# Patient Record
Sex: Male | Born: 1950
Health system: Southern US, Community
[De-identification: ages and names within clinical notes are randomized; demographics above are authoritative.]

## PROBLEM LIST (undated history)

## (undated) DIAGNOSIS — F0390 Unspecified dementia without behavioral disturbance: Secondary | ICD-10-CM

## (undated) DIAGNOSIS — H269 Unspecified cataract: Secondary | ICD-10-CM

## (undated) DIAGNOSIS — H35039 Hypertensive retinopathy, unspecified eye: Secondary | ICD-10-CM

## (undated) DIAGNOSIS — E11319 Type 2 diabetes mellitus with unspecified diabetic retinopathy without macular edema: Secondary | ICD-10-CM

## (undated) DIAGNOSIS — R569 Unspecified convulsions: Secondary | ICD-10-CM

## (undated) HISTORY — DX: Hypertensive retinopathy, unspecified eye: H35.039

## (undated) HISTORY — DX: Unspecified cataract: H26.9

## (undated) HISTORY — DX: Type 2 diabetes mellitus with unspecified diabetic retinopathy without macular edema: E11.319

## (undated) HISTORY — DX: Unspecified convulsions: R56.9

---

## 2002-04-20 ENCOUNTER — Emergency Department (HOSPITAL_COMMUNITY): Admission: EM | Admit: 2002-04-20 | Discharge: 2002-04-20 | Payer: Self-pay | Admitting: Emergency Medicine

## 2014-09-26 DIAGNOSIS — E119 Type 2 diabetes mellitus without complications: Secondary | ICD-10-CM | POA: Insufficient documentation

## 2019-12-23 DIAGNOSIS — H53462 Homonymous bilateral field defects, left side: Secondary | ICD-10-CM | POA: Insufficient documentation

## 2020-01-20 ENCOUNTER — Emergency Department (HOSPITAL_COMMUNITY): Payer: PPO

## 2020-01-20 ENCOUNTER — Other Ambulatory Visit: Payer: Self-pay

## 2020-01-20 ENCOUNTER — Inpatient Hospital Stay (HOSPITAL_COMMUNITY): Payer: PPO

## 2020-01-20 ENCOUNTER — Inpatient Hospital Stay (HOSPITAL_COMMUNITY)
Admission: EM | Admit: 2020-01-20 | Discharge: 2020-01-24 | DRG: 042 | Disposition: A | Discharge status: Home / Self Care | Payer: PPO | Attending: Internal Medicine | Admitting: Internal Medicine

## 2020-01-20 ENCOUNTER — Encounter (HOSPITAL_COMMUNITY): Payer: Self-pay

## 2020-01-20 DIAGNOSIS — E785 Hyperlipidemia, unspecified: Secondary | ICD-10-CM | POA: Diagnosis not present

## 2020-01-20 DIAGNOSIS — Z20822 Contact with and (suspected) exposure to covid-19: Secondary | ICD-10-CM | POA: Diagnosis present

## 2020-01-20 DIAGNOSIS — I10 Essential (primary) hypertension: Secondary | ICD-10-CM | POA: Diagnosis not present

## 2020-01-20 DIAGNOSIS — Z825 Family history of asthma and other chronic lower respiratory diseases: Secondary | ICD-10-CM

## 2020-01-20 DIAGNOSIS — Z833 Family history of diabetes mellitus: Secondary | ICD-10-CM

## 2020-01-20 DIAGNOSIS — I63411 Cerebral infarction due to embolism of right middle cerebral artery: Principal | ICD-10-CM | POA: Diagnosis present

## 2020-01-20 DIAGNOSIS — G936 Cerebral edema: Secondary | ICD-10-CM | POA: Diagnosis not present

## 2020-01-20 DIAGNOSIS — S199XXA Unspecified injury of neck, initial encounter: Secondary | ICD-10-CM | POA: Diagnosis not present

## 2020-01-20 DIAGNOSIS — E669 Obesity, unspecified: Secondary | ICD-10-CM | POA: Diagnosis present

## 2020-01-20 DIAGNOSIS — I6340 Cerebral infarction due to embolism of unspecified cerebral artery: Secondary | ICD-10-CM | POA: Diagnosis not present

## 2020-01-20 DIAGNOSIS — R29701 NIHSS score 1: Secondary | ICD-10-CM | POA: Diagnosis not present

## 2020-01-20 DIAGNOSIS — I639 Cerebral infarction, unspecified: Secondary | ICD-10-CM | POA: Insufficient documentation

## 2020-01-20 DIAGNOSIS — I6381 Other cerebral infarction due to occlusion or stenosis of small artery: Secondary | ICD-10-CM | POA: Diagnosis not present

## 2020-01-20 DIAGNOSIS — R413 Other amnesia: Secondary | ICD-10-CM | POA: Diagnosis present

## 2020-01-20 DIAGNOSIS — I63511 Cerebral infarction due to unspecified occlusion or stenosis of right middle cerebral artery: Secondary | ICD-10-CM | POA: Diagnosis not present

## 2020-01-20 DIAGNOSIS — S0990XA Unspecified injury of head, initial encounter: Secondary | ICD-10-CM | POA: Diagnosis not present

## 2020-01-20 DIAGNOSIS — E119 Type 2 diabetes mellitus without complications: Secondary | ICD-10-CM

## 2020-01-20 DIAGNOSIS — Z6833 Body mass index (BMI) 33.0-33.9, adult: Secondary | ICD-10-CM

## 2020-01-20 DIAGNOSIS — I6389 Other cerebral infarction: Secondary | ICD-10-CM | POA: Diagnosis not present

## 2020-01-20 DIAGNOSIS — S99912A Unspecified injury of left ankle, initial encounter: Secondary | ICD-10-CM | POA: Diagnosis not present

## 2020-01-20 DIAGNOSIS — R296 Repeated falls: Secondary | ICD-10-CM | POA: Diagnosis present

## 2020-01-20 DIAGNOSIS — Z03818 Encounter for observation for suspected exposure to other biological agents ruled out: Secondary | ICD-10-CM | POA: Diagnosis not present

## 2020-01-20 DIAGNOSIS — G3189 Other specified degenerative diseases of nervous system: Secondary | ICD-10-CM | POA: Diagnosis not present

## 2020-01-20 DIAGNOSIS — M7989 Other specified soft tissue disorders: Secondary | ICD-10-CM | POA: Diagnosis not present

## 2020-01-20 HISTORY — DX: Cerebral infarction, unspecified: I63.9

## 2020-01-20 HISTORY — DX: Essential (primary) hypertension: I10

## 2020-01-20 LAB — COMPREHENSIVE METABOLIC PANEL
ALT: 17 U/L (ref 0–44)
AST: 19 U/L (ref 15–41)
Albumin: 3.9 g/dL (ref 3.5–5.0)
Alkaline Phosphatase: 48 U/L (ref 38–126)
Anion gap: 8 (ref 5–15)
BUN: 14 mg/dL (ref 8–23)
CO2: 27 mmol/L (ref 22–32)
Calcium: 8.9 mg/dL (ref 8.9–10.3)
Chloride: 106 mmol/L (ref 98–111)
Creatinine, Ser: 1.12 mg/dL (ref 0.61–1.24)
GFR calc Af Amer: >60 mL/min (ref >60)
GFR calc non Af Amer: >60 mL/min (ref >60)
Glucose, Bld: 136 mg/dL — ABNORMAL HIGH (ref 70–99)
Potassium: 4 mmol/L (ref 3.5–5.1)
Sodium: 141 mmol/L (ref 135–145)
Total Bilirubin: 0.7 mg/dL (ref 0.3–1.2)
Total Protein: 7.5 g/dL (ref 6.5–8.1)

## 2020-01-20 LAB — CBC WITH DIFFERENTIAL/PLATELET
Abs Immature Granulocytes: 0.02 10*3/uL (ref 0.00–0.07)
Basophils Absolute: 0 10*3/uL (ref 0.0–0.1)
Basophils Relative: 1 %
Eosinophils Absolute: 0.2 10*3/uL (ref 0.0–0.5)
Eosinophils Relative: 3 %
HCT: 41.2 % (ref 39.0–52.0)
Hemoglobin: 13.9 g/dL (ref 13.0–17.0)
Immature Granulocytes: 0 %
Lymphocytes Relative: 24 %
Lymphs Abs: 1.4 10*3/uL (ref 0.7–4.0)
MCH: 29.4 pg (ref 26.0–34.0)
MCHC: 33.7 g/dL (ref 30.0–36.0)
MCV: 87.1 fL (ref 80.0–100.0)
Monocytes Absolute: 0.6 10*3/uL (ref 0.1–1.0)
Monocytes Relative: 10 %
Neutro Abs: 3.7 10*3/uL (ref 1.7–7.7)
Neutrophils Relative %: 62 %
Platelets: 268 10*3/uL (ref 150–400)
RBC: 4.73 MIL/uL (ref 4.22–5.81)
RDW: 13 % (ref 11.5–15.5)
WBC: 6 10*3/uL (ref 4.0–10.5)
nRBC: 0 % (ref 0.0–0.2)

## 2020-01-20 LAB — URINALYSIS, ROUTINE W REFLEX MICROSCOPIC
Bilirubin Urine: NEGATIVE
Glucose, UA: NEGATIVE mg/dL
Hgb urine dipstick: NEGATIVE
Ketones, ur: NEGATIVE mg/dL
Leukocytes,Ua: NEGATIVE
Nitrite: NEGATIVE
Protein, ur: NEGATIVE mg/dL
Specific Gravity, Urine: 1.017 (ref 1.005–1.030)
pH: 6 (ref 5.0–8.0)

## 2020-01-20 LAB — RAPID URINE DRUG SCREEN, HOSP PERFORMED
Amphetamines: NOT DETECTED
Barbiturates: NOT DETECTED
Benzodiazepines: NOT DETECTED
Cocaine: NOT DETECTED
Opiates: NOT DETECTED
Tetrahydrocannabinol: NOT DETECTED

## 2020-01-20 LAB — APTT: aPTT: 29 seconds (ref 24–36)

## 2020-01-20 LAB — ETHANOL: Alcohol, Ethyl (B): <10 mg/dL (ref <10)

## 2020-01-20 LAB — PROTIME-INR
INR: 1 (ref 0.8–1.2)
Prothrombin Time: 13.5 seconds (ref 11.4–15.2)

## 2020-01-20 MED ORDER — ASPIRIN 325 MG PO TABS
325.0000 mg | ORAL_TABLET | Freq: Every day | ORAL | Status: DC
  Administered 2020-01-20: 325 mg via ORAL
  Filled 2020-01-20: qty 1

## 2020-01-20 MED ORDER — SODIUM CHLORIDE 0.9 % IV SOLN: INTRAVENOUS | Status: DC

## 2020-01-20 MED ORDER — SENNOSIDES-DOCUSATE SODIUM 8.6-50 MG PO TABS: 1.0000 | ORAL_TABLET | Freq: Every evening | ORAL | Status: DC | PRN

## 2020-01-20 MED ORDER — STROKE: EARLY STAGES OF RECOVERY BOOK
Freq: Once | Status: AC
  Filled 2020-01-20: qty 1

## 2020-01-20 MED ORDER — ASPIRIN 300 MG RE SUPP: 300.0000 mg | Freq: Every day | RECTAL | Status: DC

## 2020-01-20 MED ORDER — ASPIRIN 325 MG PO TABS: 325.0000 mg | ORAL_TABLET | Freq: Every day | ORAL | Status: DC

## 2020-01-20 MED ORDER — ACETAMINOPHEN 325 MG PO TABS
650.0000 mg | ORAL_TABLET | ORAL | Status: DC | PRN
  Administered 2020-01-23: 650 mg via ORAL
  Filled 2020-01-20: qty 2

## 2020-01-20 MED ORDER — ENOXAPARIN SODIUM 40 MG/0.4ML ~~LOC~~ SOLN
40.0000 mg | SUBCUTANEOUS | Status: DC
  Administered 2020-01-21 – 2020-01-24 (×4): 40 mg via SUBCUTANEOUS
  Filled 2020-01-20 (×4): qty 0.4

## 2020-01-20 MED ORDER — ACETAMINOPHEN 160 MG/5ML PO SOLN: 650.0000 mg | ORAL | Status: DC | PRN

## 2020-01-20 MED ORDER — ACETAMINOPHEN 650 MG RE SUPP: 650.0000 mg | RECTAL | Status: DC | PRN

## 2020-01-20 MED ORDER — ATORVASTATIN CALCIUM 40 MG PO TABS
40.0000 mg | ORAL_TABLET | Freq: Every day | ORAL | Status: DC
  Administered 2020-01-21 – 2020-01-23 (×3): 40 mg via ORAL
  Filled 2020-01-20 (×3): qty 1

## 2020-01-20 MED ORDER — IOHEXOL 350 MG/ML SOLN
80.0000 mL | Freq: Once | INTRAVENOUS | Status: AC | PRN
  Administered 2020-01-20: 80 mL via INTRAVENOUS
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
## 2020-01-20 NOTE — ED Notes (Signed)
Pt was given a urinal and went to the bathroom but said the jug was leaking out of the bottom.  The daughter thinks the pt is just confused and did not understand how to use the urinal so we were unable to get a urine sample.

## 2020-01-20 NOTE — ED Notes (Signed)
[facility] contacted for transport, spoke with Tammy. Paperwork printed.
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
## 2020-01-21 ENCOUNTER — Inpatient Hospital Stay (HOSPITAL_COMMUNITY): Payer: PPO

## 2020-01-21 ENCOUNTER — Encounter (HOSPITAL_COMMUNITY): Payer: 59

## 2020-01-21 DIAGNOSIS — I10 Essential (primary) hypertension: Secondary | ICD-10-CM

## 2020-01-21 DIAGNOSIS — I6389 Other cerebral infarction: Secondary | ICD-10-CM

## 2020-01-21 LAB — LIPID PANEL
Cholesterol: 172 mg/dL (ref 0–200)
HDL: 28 mg/dL — ABNORMAL LOW (ref >40)
LDL Cholesterol: 129 mg/dL — ABNORMAL HIGH (ref 0–99)
Total CHOL/HDL Ratio: 6.1 RATIO
Triglycerides: 77 mg/dL (ref <150)
VLDL: 15 mg/dL (ref 0–40)

## 2020-01-21 LAB — RPR: RPR Ser Ql: NONREACTIVE

## 2020-01-21 LAB — HIV ANTIBODY (ROUTINE TESTING W REFLEX): HIV Screen 4th Generation wRfx: NONREACTIVE

## 2020-01-21 LAB — GLUCOSE, CAPILLARY
Glucose-Capillary: 129 mg/dL — ABNORMAL HIGH (ref 70–99)
Glucose-Capillary: 114 mg/dL — ABNORMAL HIGH (ref 70–99)

## 2020-01-21 LAB — FOLATE: Folate: 13 ng/mL (ref >5.9)

## 2020-01-21 LAB — HEMOGLOBIN A1C
Hgb A1c MFr Bld: 7.4 % — ABNORMAL HIGH (ref 4.8–5.6)
Mean Plasma Glucose: 165.68 mg/dL

## 2020-01-21 LAB — VITAMIN B12: Vitamin B-12: 202 pg/mL (ref 180–914)

## 2020-01-21 LAB — SARS CORONAVIRUS 2 (TAT 6-24 HRS): SARS Coronavirus 2: NEGATIVE

## 2020-01-21 LAB — TSH: TSH: 1.73 u[IU]/mL (ref 0.350–4.500)

## 2020-01-21 MED ORDER — ASPIRIN EC 325 MG PO TBEC
325.0000 mg | DELAYED_RELEASE_TABLET | Freq: Every day | ORAL | Status: DC
  Administered 2020-01-21 – 2020-01-24 (×4): 325 mg via ORAL
  Filled 2020-01-21 (×4): qty 1

## 2020-01-21 MED ORDER — CYANOCOBALAMIN 1000 MCG/ML IJ SOLN
1000.0000 ug | Freq: Once | INTRAMUSCULAR | Status: AC
  Administered 2020-01-21: 1000 ug via INTRAMUSCULAR
  Filled 2020-01-21: qty 1

## 2020-01-21 MED ORDER — VITAMIN B-12 1000 MCG PO TABS
1000.0000 ug | ORAL_TABLET | Freq: Every day | ORAL | Status: DC
  Administered 2020-01-22 – 2020-01-24 (×3): 1000 ug via ORAL
  Filled 2020-01-21 (×3): qty 1

## 2020-01-21 MED ORDER — INSULIN ASPART 100 UNIT/ML ~~LOC~~ SOLN
0.0000 [IU] | Freq: Three times a day (TID) | SUBCUTANEOUS | Status: DC
  Administered 2020-01-21 – 2020-01-23 (×3): 1 [IU] via SUBCUTANEOUS

## 2020-01-21 MED ORDER — CLOPIDOGREL BISULFATE 75 MG PO TABS
75.0000 mg | ORAL_TABLET | Freq: Every day | ORAL | Status: DC
  Administered 2020-01-21 – 2020-01-24 (×4): 75 mg via ORAL
  Filled 2020-01-21 (×4): qty 1
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
## 2020-01-21 NOTE — Progress Notes (Signed)
  Echocardiogram 2D Echocardiogram has been performed.  Delcie Roch 01/21/2020, 6:07 PM
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
## 2020-01-21 NOTE — Progress Notes (Signed)
Inpatient Diabetes Program Recommendations  AACE/ADA: New Consensus Statement on Inpatient Glycemic Control   Target Ranges:  Prepandial:   less than 140 mg/dL      Peak postprandial:   less than 180 mg/dL (1-2 hours)      Critically ill patients:  140 - 180 mg/dL   Results for [name], [name] (MRN [id number]) as of 01/21/2020 14:26  Ref. Range 01/21/2020 12:41  Glucose-Capillary Latest Ref Range: 70 - 99 mg/dL 270 (H)  Results for [name], [name] (MRN [id number]) as of 01/21/2020 14:26  Ref. Range 01/20/2020 16:35 01/21/2020 02:14  Glucose Latest Ref Range: 70 - 99 mg/dL 299 (H)   Hemoglobin B7J Latest Ref Range: 4.8 - 5.6 %  7.4 (H)   Review of Glycemic Control  Diabetes history: DM2 Outpatient Diabetes medications: None listed; in 2016 pt was prescribed Amaryl 4 mg QAM and Metformin 1000 mg BID Current orders for Inpatient glycemic control: Novolog 0-9 units TID with meals  Inpatient Diabetes Program Recommendations:    A1C: A1C 7.4% on 01/21/20 indicating an average glucose of 166 mg/dl over the past 2-3 months. In reviewing chart, noted office visit note by Dr. Lindi Adie on 07/30/15 at which time patient was prescribed Amaryl 4 mg QAM and Metformin 1000 mg BID and A1C was noted to be 8.7% on 04/02/2015.   NOTE: Noted consult for Diabetes Coordinator. Diabetes Coordinator is not on campus over the weekend but available by pager from 8am to 5pm for questions or concerns. Chart reviewed. Noted in Care Everywhere that patient has a documented history of DM and was taking Amaryl and Metformin in 2016. Current A1C 7.4% on 01/21/20 indicating an average glucose of 166 mg/dl. Patient admitted with confusion and acute CVA. If patient is no longer taking any DM medications as an outpatient, may want to consider discharging on oral DM medication and have patient follow up with PCP regarding DM control.  Thanks, Orlando Penner, RN, MSN, CDE Diabetes Coordinator Inpatient Diabetes Program [phone] (Team  Pager from 8am to 5pm)

## 2020-01-22 DIAGNOSIS — I6340 Cerebral infarction due to embolism of unspecified cerebral artery: Secondary | ICD-10-CM

## 2020-01-22 LAB — GLUCOSE, CAPILLARY
Glucose-Capillary: 112 mg/dL — ABNORMAL HIGH (ref 70–99)
Glucose-Capillary: 127 mg/dL — ABNORMAL HIGH (ref 70–99)
Glucose-Capillary: 104 mg/dL — ABNORMAL HIGH (ref 70–99)
Glucose-Capillary: 173 mg/dL — ABNORMAL HIGH (ref 70–99)
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
## 2020-01-22 NOTE — Plan of Care (Signed)
  Problem: Health Behavior/Discharge Planning: Goal: Ability to manage health-related needs will improve Outcome: Progressing   Problem: Clinical Measurements: Goal: Ability to maintain clinical measurements within normal limits will improve Outcome: Progressing Goal: Will remain free from infection Outcome: Progressing   Problem: Clinical Measurements: Goal: Ability to maintain clinical measurements within normal limits will improve Outcome: Progressing   Problem: Clinical Measurements: Goal: Will remain free from infection Outcome: Progressing   
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
## 2020-01-23 ENCOUNTER — Encounter (HOSPITAL_COMMUNITY)
Admission: EM | Disposition: A | Discharge status: Home / Self Care | Payer: Self-pay | Source: Home / Self Care | Attending: Internal Medicine

## 2020-01-23 DIAGNOSIS — I639 Cerebral infarction, unspecified: Secondary | ICD-10-CM

## 2020-01-23 HISTORY — PX: LOOP RECORDER INSERTION: EP1214

## 2020-01-23 LAB — GLUCOSE, CAPILLARY
Glucose-Capillary: 116 mg/dL — ABNORMAL HIGH (ref 70–99)
Glucose-Capillary: 129 mg/dL — ABNORMAL HIGH (ref 70–99)
Glucose-Capillary: 108 mg/dL — ABNORMAL HIGH (ref 70–99)
Glucose-Capillary: 117 mg/dL — ABNORMAL HIGH (ref 70–99)

## 2020-01-23 LAB — ECHOCARDIOGRAM COMPLETE
Height: 67 in
Weight: 3432.12 oz

## 2020-01-23 SURGERY — LOOP RECORDER INSERTION

## 2020-01-23 MED ORDER — LIDOCAINE-EPINEPHRINE 1 %-1:100000 IJ SOLN
INTRAMUSCULAR | Status: DC | PRN
  Administered 2020-01-23: 20 mL

## 2020-01-23 MED ORDER — HYDROCHLOROTHIAZIDE 12.5 MG PO CAPS: 12.5000 mg | ORAL_CAPSULE | Freq: Every day | ORAL | 2 refills | Status: DC

## 2020-01-23 MED ORDER — CLOPIDOGREL BISULFATE 75 MG PO TABS: 75.0000 mg | ORAL_TABLET | Freq: Every day | ORAL | 2 refills | Status: AC

## 2020-01-23 MED ORDER — LIDOCAINE-EPINEPHRINE 1 %-1:100000 IJ SOLN
INTRAMUSCULAR | Status: AC
  Filled 2020-01-23: qty 1

## 2020-01-23 MED ORDER — ATORVASTATIN CALCIUM 40 MG PO TABS: 40.0000 mg | ORAL_TABLET | Freq: Every day | ORAL | 2 refills | Status: DC

## 2020-01-23 MED ORDER — ASPIRIN 325 MG PO TBEC: 325.0000 mg | DELAYED_RELEASE_TABLET | Freq: Every day | ORAL | 2 refills | Status: DC

## 2020-01-23 MED ORDER — METFORMIN HCL 500 MG PO TABS: 500.0000 mg | ORAL_TABLET | Freq: Two times a day (BID) | ORAL | 2 refills | Status: DC

## 2020-01-23 MED FILL — metFORMIN HCL 500 MG TABS: 500 | 30 days supply | Qty: 60 | Fill #0

## 2020-01-23 MED FILL — CLOPIDOGREL 75 MG TABLET: 75 | 30 days supply | Qty: 30 | Fill #0

## 2020-01-23 MED FILL — HYDROCHLOROTHIAZIDE 12.5 MG: 12.5 | 30 days supply | Qty: 30 | Fill #0

## 2020-01-23 MED FILL — ASPIRIN EC 325 MG TABLET: 325 | 30 days supply | Qty: 30 | Fill #0

## 2020-01-23 MED FILL — ATORVASTATIN CALCIUM 40 MG: 40 | 30 days supply | Qty: 30 | Fill #0

## 2020-01-23 SURGICAL SUPPLY — 2 items
MONITOR REVEAL LINQ II (Prosthesis & Implant Heart) ×2 IMPLANT
PACK LOOP INSERTION (CUSTOM PROCEDURE TRAY) ×3 IMPLANT
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
## 2020-01-23 NOTE — Discharge Instructions (Signed)
Post heart monitor implant wound care instructions Keep incision clean and dry for 3 days. You can remove outer dressing tomorrow. Leave steri-strips (little pieces of tape) on until seen in the office for wound check appointment. Call the office [phone]) for redness, drainage, swelling, or fever.
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
## 2020-01-23 NOTE — TOC Transition Note (Signed)
Transition of Care [facility]) - CM/SW Discharge Note   Patient Details  Name: [name] MRN: [id number] Date of Birth: 07-Mar-1951  [facility] CM/SW Contact:  Kermit Balo, RN Phone Number: 01/23/2020, 3:01 PM   Clinical Narrative:    Pt discharging home with self care. No f/u per PT/OT.  Pt has hospital f/u, insurance.  Pt has transportation home.    Final next level of care: Home/Self Care Barriers to Discharge: No Barriers Identified   Patient Goals and CMS Choice        Discharge Placement                       Discharge Plan and Services                                     Social Determinants of Health (SDOH) Interventions     Readmission Risk Interventions No flowsheet data found.

## 2020-01-23 NOTE — Discharge Summary (Signed)
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  [name] Millers MD Electronically signed by [name] Millers MD Signature Date/Time: 01/23/2020/11:54:24 AM    Final        Discharge Exam: Vitals:   01/24/20 0308 01/24/20 0844  BP: (!) 157/72 (!) 157/72  Pulse: 65 77  Resp: 17 16  Temp: 98.2 F (36.8 C) 98.8 F (37.1 C)  SpO2: 100% 100%    General exam: Appears calm and comfortable  Respiratory system: Clear to auscultation. Respiratory effort normal. Cardiovascular system: S1 & S2 heard, RRR. No pedal edema. Gastrointestinal system: Abdomen is nondistended, soft and nontender. Normal bowel sounds heard. Central nervous system: Alert. Non focal exam. Speech clear  Extremities: Symmetric in appearance bilaterally  Skin: No rashes, lesions or ulcers on exposed skin  Psychiatry: Stable     The results of significant diagnostics from this hospitalization (including imaging, microbiology, ancillary and laboratory) are listed below for reference.     Microbiology:  Recent Results (from the past 240 hour(s))  SARS CORONAVIRUS 2 (TAT 6-24 HRS) Nasopharyngeal Nasopharyngeal Swab     Status: None   Collection Time: 01/20/20   7:45 PM   Specimen: Nasopharyngeal Swab  Result Value Ref Range Status   SARS Coronavirus 2 NEGATIVE NEGATIVE Final    Comment: (NOTE) SARS-CoV-2 target nucleic acids are NOT DETECTED. The SARS-CoV-2 RNA is generally detectable in upper and lower respiratory specimens during the acute phase of infection. Negative results do not preclude SARS-CoV-2 infection, do not rule out co-infections with other pathogens, and should not be used as the sole basis for treatment or other patient management decisions. Negative results must be combined with clinical observations, patient history, and epidemiological information. The expected result is Negative. Fact Sheet for Patients: HairSlick.no Fact Sheet for Healthcare Providers: quierodirigir.com This test is not yet approved or cleared by the [place] FDA and  has been authorized for detection and/or diagnosis of SARS-CoV-2 by FDA under an Emergency Use Authorization (EUA). This EUA will remain  in effect (meaning this test can be used) for the duration of the COVID-19 declaration under Section 56 4(b)(1) of the Act, 21 U.S.C. section 360bbb-3(b)(1), unless the authorization is terminated or revoked sooner. Performed at [facility], [address] [place], [place] [address]      Labs: BNP (last 3 results) No results for input(s): BNP in the last 8760 hours. Basic Metabolic Panel: Recent Labs  Lab 01/20/20 1635  NA 141  K 4.0  CL 106  CO2 27  GLUCOSE 136*  BUN 14  CREATININE 1.12  CALCIUM 8.9   Liver Function Tests: Recent Labs  Lab 01/20/20 1635  AST 19  ALT 17  ALKPHOS 48  BILITOT 0.7  PROT 7.5  ALBUMIN 3.9   No results for input(s): LIPASE, AMYLASE in the last 168 hours. No results for input(s): AMMONIA in the last 168 hours. CBC: Recent Labs  Lab 01/20/20 1635  WBC 6.0  NEUTROABS 3.7  HGB 13.9  HCT 41.2  MCV 87.1  PLT 268   Cardiac  Enzymes: No results for input(s): CKTOTAL, CKMB, CKMBINDEX, TROPONINI in the last 168 hours. BNP: Invalid input(s): POCBNP CBG: Recent Labs  Lab 01/23/20 0601 01/23/20 1251 01/23/20 1701 01/23/20 2119 01/24/20 0611  GLUCAP 116* 129* 108* 117* 85   D-Dimer No results for input(s): DDIMER in the last 72 hours. Hgb A1c No results for input(s): HGBA1C in the last 72 hours. Lipid Profile No results for input(s): CHOL, HDL, LDLCALC, TRIG, CHOLHDL, LDLDIRECT in the last 72 hours. Thyroid function studies No results for input(s): TSH, T4TOTAL, T3FREE, THYROIDAB in the last 72 hours.  Invalid input(s): FREET3 Anemia work up Recent Labs    01/21/20 0924  FOLATE 13.0   Urinalysis    Component Value Date/Time   COLORURINE YELLOW 01/20/2020 1909   APPEARANCEUR CLEAR 01/20/2020 1909   LABSPEC 1.017 01/20/2020 1909   PHURINE 6.0 01/20/2020 1909   GLUCOSEU NEGATIVE 01/20/2020 1909   HGBUR NEGATIVE 01/20/2020 1909   BILIRUBINUR NEGATIVE 01/20/2020 1909   KETONESUR NEGATIVE 01/20/2020 1909   PROTEINUR NEGATIVE 01/20/2020 1909   NITRITE NEGATIVE 01/20/2020 1909   LEUKOCYTESUR NEGATIVE 01/20/2020 1909   Sepsis Labs Invalid input(s): PROCALCITONIN,  WBC,  LACTICIDVEN Microbiology Recent Results (from the past 240 hour(s))  SARS CORONAVIRUS 2 (TAT 6-24 HRS) Nasopharyngeal Nasopharyngeal Swab     Status: None   Collection Time: 01/20/20  7:45 PM   Specimen: Nasopharyngeal Swab  Result Value  Ref Range Status   SARS Coronavirus 2 NEGATIVE NEGATIVE Final    Comment: (NOTE) SARS-CoV-2 target nucleic acids are NOT DETECTED. The SARS-CoV-2 RNA is generally detectable in upper and lower respiratory specimens during the acute phase of infection. Negative results do not preclude SARS-CoV-2 infection, do not rule out co-infections with other pathogens, and should not be used as the sole basis for treatment or other patient management decisions. Negative results must be combined with  clinical observations, patient history, and epidemiological information. The expected result is Negative. Fact Sheet for Patients: HairSlick.no Fact Sheet for Healthcare Providers: quierodirigir.com This test is not yet approved or cleared by the [place] FDA and  has been authorized for detection and/or diagnosis of SARS-CoV-2 by FDA under an Emergency Use Authorization (EUA). This EUA will remain  in effect (meaning this test can be used) for the duration of the COVID-19 declaration under Section 56 4(b)(1) of the Act, 21 U.S.C. section 360bbb-3(b)(1), unless the authorization is terminated or revoked sooner. Performed at [facility], [address], [place], [place] [address]      Patient was seen and examined on the day of discharge and was found to be in stable condition. Time coordinating discharge: 25 minutes including assessment and coordination of care, as well as examination of the patient.   SIGNED:  Noralee Stain, DO [facility] 01/24/2020, 9:18 AM
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
## 2020-01-24 LAB — FOLATE RBC
Folate, Hemolysate: 322 ng/mL
Folate, RBC: 859 ng/mL (ref >498)
Hematocrit: 37.5 % (ref 37.5–51.0)

## 2020-01-24 LAB — GLUCOSE, CAPILLARY
Glucose-Capillary: 85 mg/dL (ref 70–99)
Glucose-Capillary: 88 mg/dL (ref 70–99)
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
## 2020-01-24 NOTE — TOC Transition Note (Signed)
Transition of Care [facility]) - CM/SW Discharge Note   Patient Details  Name: Evart Mcdonnell MRN: [id number] Date of Birth: [birth year]  Transition of Care [facility]) CM/SW Contact:  Kermit Balo, RN Phone Number: 01/24/2020, 1:58 PM   Clinical Narrative:    Pt discharging to daughters home. She states he will have 24 hour supervision. No f/u per PT/OT and no DME needs.  Daughter denies issues with medications or transportation. Daughter providing transport home today.   Final next level of care: Home/Self Care Barriers to Discharge: No Barriers Identified   Patient Goals and CMS Choice        Discharge Placement                       Discharge Plan and Services                                     Social Determinants of Health (SDOH) Interventions     Readmission Risk Interventions No flowsheet data found.

## 2020-01-24 NOTE — Progress Notes (Signed)
Pt discharged home with daughter in stable condition 
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
## 2020-01-28 LAB — VITAMIN B1: Vitamin B1 (Thiamine): 91.4 nmol/L (ref 66.5–200.0)

## 2020-02-02 ENCOUNTER — Ambulatory Visit (INDEPENDENT_AMBULATORY_CARE_PROVIDER_SITE_OTHER): Payer: 59 | Admitting: *Deleted

## 2020-02-02 ENCOUNTER — Other Ambulatory Visit: Payer: Self-pay

## 2020-02-02 DIAGNOSIS — I639 Cerebral infarction, unspecified: Secondary | ICD-10-CM

## 2020-02-02 LAB — CUP PACEART INCLINIC DEVICE CHECK
Date Time Interrogation Session: 20210211164958
Implantable Pulse Generator Implant Date: 20210201

## 2020-02-02 NOTE — Progress Notes (Signed)
ILR wound check in clinic. Steri strips removed. Wound well healed. Home monitor transmitting nightly. No episodes. Questions answered. P wave 0.75 mV.

## 2020-02-10 DIAGNOSIS — Z7984 Long term (current) use of oral hypoglycemic drugs: Secondary | ICD-10-CM | POA: Diagnosis not present

## 2020-02-10 DIAGNOSIS — Z8673 Personal history of transient ischemic attack (TIA), and cerebral infarction without residual deficits: Secondary | ICD-10-CM | POA: Diagnosis not present

## 2020-02-10 DIAGNOSIS — Z6831 Body mass index (BMI) 31.0-31.9, adult: Secondary | ICD-10-CM | POA: Diagnosis not present

## 2020-02-10 DIAGNOSIS — I739 Peripheral vascular disease, unspecified: Secondary | ICD-10-CM | POA: Diagnosis not present

## 2020-02-10 DIAGNOSIS — E669 Obesity, unspecified: Secondary | ICD-10-CM | POA: Diagnosis not present

## 2020-02-10 DIAGNOSIS — Z0000 Encounter for general adult medical examination without abnormal findings: Secondary | ICD-10-CM | POA: Diagnosis not present

## 2020-02-10 DIAGNOSIS — I110 Hypertensive heart disease with heart failure: Secondary | ICD-10-CM | POA: Diagnosis not present

## 2020-02-10 DIAGNOSIS — Z7189 Other specified counseling: Secondary | ICD-10-CM | POA: Diagnosis not present

## 2020-02-10 DIAGNOSIS — E1169 Type 2 diabetes mellitus with other specified complication: Secondary | ICD-10-CM | POA: Diagnosis not present

## 2020-02-10 DIAGNOSIS — I509 Heart failure, unspecified: Secondary | ICD-10-CM | POA: Diagnosis not present

## 2020-02-10 DIAGNOSIS — Z0001 Encounter for general adult medical examination with abnormal findings: Secondary | ICD-10-CM | POA: Diagnosis not present

## 2020-02-20 ENCOUNTER — Ambulatory Visit (INDEPENDENT_AMBULATORY_CARE_PROVIDER_SITE_OTHER): Payer: PPO | Admitting: Student

## 2020-02-20 ENCOUNTER — Encounter: Payer: Self-pay | Admitting: Student

## 2020-02-20 ENCOUNTER — Other Ambulatory Visit: Payer: Self-pay

## 2020-02-20 VITALS — BP 154/84 | HR 86 | Ht 67.0 in | Wt 205.8 lb

## 2020-02-20 DIAGNOSIS — I10 Essential (primary) hypertension: Secondary | ICD-10-CM

## 2020-02-20 DIAGNOSIS — Z8673 Personal history of transient ischemic attack (TIA), and cerebral infarction without residual deficits: Secondary | ICD-10-CM

## 2020-02-20 DIAGNOSIS — E669 Obesity, unspecified: Secondary | ICD-10-CM | POA: Diagnosis not present

## 2020-02-20 DIAGNOSIS — I5042 Chronic combined systolic (congestive) and diastolic (congestive) heart failure: Secondary | ICD-10-CM

## 2020-02-20 MED ORDER — LOSARTAN POTASSIUM 25 MG PO TABS: 25.0000 mg | ORAL_TABLET | Freq: Every day | ORAL | 3 refills | Status: DC

## 2020-02-20 MED ORDER — CARVEDILOL 3.125 MG PO TABS: 3.1250 mg | ORAL_TABLET | Freq: Two times a day (BID) | ORAL | 3 refills | Status: DC

## 2020-02-20 NOTE — Patient Instructions (Addendum)
Medication Instructions:  START CARVEDILOL (COREG) 3.125 mg TWICE DAILY START LOSARTAN 25 mg DAILY *If you need a refill on your cardiac medications before your next appointment, please call your pharmacy*   Lab Work: none If you have labs (blood work) drawn today and your tests are completely normal, you will receive your results only by: [name] MyChart Message (if you have MyChart) OR . A paper copy in the mail If you have any lab test that is abnormal or we need to change your treatment, we will call you to review the results.   Testing/Procedures: none   Follow-Up:  Needs 3-4 week appointment with General Cardiology for HTN/Diastolic HF  (may be with APP) At [facility], you and your health needs are our priority.  As part of our continuing mission to provide you with exceptional heart care, we have created designated Provider Care Teams.  These Care Teams include your primary Cardiologist (physician) and Advanced Practice Providers (APPs -  Physician Assistants and Nurse Practitioners) who all work together to provide you with the care you need, when you need it.  We recommend signing up for the patient portal called "MyChart".  Sign up information is provided on this After Visit Summary.  MyChart is used to connect with patients for Virtual Visits (Telemedicine).  Patients are able to view lab/test results, encounter notes, upcoming appointments, etc.  Non-urgent messages can be sent to your provider as well.   To learn more about what you can do with MyChart, go to [email or web address].     Other Instructions  Carvedilol Tablets What is this medicine? CARVEDILOL (KAR ve dil ol) is a beta blocker. It decreases the amount of work your heart has to do and helps your heart beat regularly. It treats high blood pressure. This medicine may be used for other purposes; ask your health care provider or pharmacist if you have questions. COMMON BRAND NAME(S): Coreg What should I tell my  health care provider before I take this medicine? They need to know if you have any of these conditions:  circulation problems  diabetes  history of heart attack or heart disease  liver disease  lung or breathing disease, like asthma or emphysema  pheochromocytoma  slow or irregular heartbeat  thyroid disease  an unusual or allergic reaction to carvedilol, other beta-blockers, medicines, foods, dyes, or preservatives  pregnant or trying to get pregnant  breast-feeding How should I use this medicine? Take this drug by mouth. Take it as directed on the prescription label at the same time every day. Take it with food. Keep taking it unless your health care provider tells you to stop. Talk to your health care provider about the use of this drug in children. Special care may be needed. Overdosage: If you think you have taken too much of this medicine contact a poison control center or emergency room at once. NOTE: This medicine is only for you. Do not share this medicine with others. What if I miss a dose? If you miss a dose, take it as soon as you can. If it is almost time for your next dose, take only that dose. Do not take double or extra doses. What may interact with this medicine? This medicine may interact with the following medications:  certain medicines for blood pressure, heart disease, irregular heart beat  certain medicines for depression, like fluoxetine or paroxetine  certain medicines for diabetes, like glipizide or glyburide  cimetidine  clonidine  cyclosporine  digoxin  MAOIs  like Carbex, Eldepryl, Marplan, Nardil, and Parnate  reserpine  rifampin This list may not describe all possible interactions. Give your health care provider a list of all the medicines, herbs, non-prescription drugs, or dietary supplements you use. Also tell them if you smoke, drink alcohol, or use illegal drugs. Some items may interact with your medicine. What should I watch  for while using this medicine? Check your heart rate and blood pressure regularly while you are taking this medicine. Ask your doctor or health care professional what your heart rate and blood pressure should be, and when you should contact him or her. Do not stop taking this medicine suddenly. This could lead to serious heart-related effects. Contact your doctor or health care professional if you have difficulty breathing while taking this drug. Check your weight daily. Ask your doctor or health care professional when you should notify him/her of any weight gain. You may get drowsy or dizzy. Do not drive, use machinery, or do anything that requires mental alertness until you know how this medicine affects you. To reduce the risk of dizzy or fainting spells, do not sit or stand up quickly. Alcohol can make you more drowsy, and increase flushing and rapid heartbeats. Avoid alcoholic drinks. This medicine may increase blood sugar. Ask your healthcare provider if changes in diet or medicines are needed if you have diabetes. If you are going to have surgery, tell your doctor or health care professional that you are taking this medicine. What side effects may I notice from receiving this medicine? Side effects that you should report to your doctor or health care professional as soon as possible:  allergic reactions like skin rash, itching or hives, swelling of the face, lips, or tongue  breathing problems  dark urine  irregular heartbeat   signs and symptoms of high blood sugar such as being more thirsty or hungry or having to urinate more than normal. You may also feel very tired or have blurry vision.  swollen legs or ankles  vomiting  yellowing of the eyes or skin Side effects that usually do not require medical attention (report to your doctor or health care professional if they continue or are bothersome):  change in sex drive or performance  diarrhea  dry eyes (especially if wearing  contact lenses)  dry, itching skin  headache  nausea  unusually tired This list may not describe all possible side effects. Call your doctor for medical advice about side effects. You may report side effects to FDA at 1-800-FDA-1088. Where should I keep my medicine? Keep out of the reach of children and pets. Store at room temperature between 20 and 25 degrees C (68 and 77 degrees F). Protect from moisture. Keep the container tightly closed. Throw away any unused drug after the expiration date. NOTE: This sheet is a summary. It may not cover all possible information. If you have questions about this medicine, talk to your doctor, pharmacist, or health care provider.  2020 Elsevier/Gold Standard (2019-07-15 17:42:09)   Losartan Tablets What is this medicine? LOSARTAN (loe SAR tan) is an angiotensin II receptor blocker, also known as an ARB. It treats high blood pressure. It can slow kidney damage in some patients. It may also be used to lower the risk of stroke. This medicine may be used for other purposes; ask your health care provider or pharmacist if you have questions. COMMON BRAND NAME(S): Cozaar What should I tell my health care provider before I take this medicine? They need to  know if you have any of these conditions:  heart failure  kidney or liver disease  an unusual or allergic reaction to losartan, other medicines, foods, dyes, or preservatives  pregnant or trying to get pregnant  breast-feeding How should I use this medicine? Take this drug by mouth. Take it as directed on the prescription label at the same time every day. You can take it with or without food. If it upsets your stomach, take it with food. Keep taking it unless your health care provider tells you to stop. Talk to your health care provider about the use of this drug in children. While it may be prescribed for children as young as 6 for selected conditions, precautions do apply. Overdosage: If you think  you have taken too much of this medicine contact a poison control center or emergency room at once. NOTE: This medicine is only for you. Do not share this medicine with others. What if I miss a dose? If you miss a dose, take it as soon as you can. If it is almost time for your next dose, take only that dose. Do not take double or extra doses. What may interact with this medicine?  blood pressure medicines  diuretics, especially triamterene, spironolactone, or amiloride  fluconazole  NSAIDs, medicines for pain and inflammation, like ibuprofen or naproxen  potassium salts or potassium supplements  rifampin This list may not describe all possible interactions. Give your health care provider a list of all the medicines, herbs, non-prescription drugs, or dietary supplements you use. Also tell them if you smoke, drink alcohol, or use illegal drugs. Some items may interact with your medicine. What should I watch for while using this medicine? Visit your doctor or health care professional for regular checks on your progress. Check your blood pressure as directed. Ask your doctor or health care professional what your blood pressure should be and when you should contact him or her. Call your doctor or health care professional if you notice an irregular or fast heart beat. Women should inform their doctor if they wish to become pregnant or think they might be pregnant. There is a potential for serious side effects to an unborn child, particularly in the second or third trimester. Talk to your health care professional or pharmacist for more information. You may get drowsy or dizzy. Do not drive, use machinery, or do anything that needs mental alertness until you know how this drug affects you. Do not stand or sit up quickly, especially if you are an older patient. This reduces the risk of dizzy or fainting spells. Alcohol can make you more drowsy and dizzy. Avoid alcoholic drinks. Avoid salt substitutes  unless you are told otherwise by your doctor or health care professional. Do not treat yourself for coughs, colds, or pain while you are taking this medicine without asking your doctor or health care professional for advice. Some ingredients may increase your blood pressure. What side effects may I notice from receiving this medicine? Side effects that you should report to your doctor or health care professional as soon as possible:  confusion, dizziness, light headedness or fainting spells  decreased amount of urine passed  difficulty breathing or swallowing, hoarseness, or tightening of the throat  fast or irregular heart beat, palpitations, or chest pain  skin rash, itching  swelling of your face, lips, tongue, hands, or feet Side effects that usually do not require medical attention (report to your doctor or health care professional if they continue or  are bothersome):  cough  decreased sexual function or desire  headache  nasal congestion or stuffiness  nausea or stomach pain  sore or cramping muscles This list may not describe all possible side effects. Call your doctor for medical advice about side effects. You may report side effects to FDA at 1-800-FDA-1088. Where should I keep my medicine? Keep out of the reach of children and pets. Store at room temperature between 15 and 30 degrees C (59 and 86 degrees F). Protect from light. Keep the container tightly closed. Throw away any unused drug after the expiration date. NOTE: This sheet is a summary. It may not cover all possible information. If you have questions about this medicine, talk to your doctor, pharmacist, or health care provider.  2020 Elsevier/Gold Standard (2019-07-13 12:12:28)

## 2020-02-20 NOTE — Progress Notes (Signed)
Electrophysiology Office Note Date: 02/20/2020  ID:  [name], DOB 1951-03-12, MRN [id number]  PCP: System, Pcp Not In Primary Cardiologist: No primary care provider on file. Electrophysiologist: Dr. Caryl Comes  CC: Pacemaker follow-up  [name] is a 69 y.o. male seen today for Dr. Caryl Comes . he presents today for routine electrophysiology followup.  Since last being seen in our clinic, the patient reports doing very well.  he denies chest pain, palpitations, dyspnea, PND, orthopnea, nausea, vomiting, dizziness, syncope, edema, weight gain, or early satiety. He snores, but had a borderline sleep study in the past and was told he did not need CPAP.   Device History: Medtronic ILR PPM 01/23/20 for cryptogenic stroke  No past medical history on file. Past Surgical History:  Procedure Laterality Date  . LOOP RECORDER INSERTION N/A 01/23/2020   Procedure: LOOP RECORDER INSERTION;  Surgeon: Deboraha Sprang, MD;  Location: [facility];  Service: Cardiovascular;  Laterality: N/A;    Current Outpatient Medications  Medication Sig Dispense Refill  . aspirin EC 325 MG EC tablet Take 1 tablet (325 mg total) by mouth daily. 30 tablet 2  . atorvastatin (LIPITOR) 40 MG tablet Take 1 tablet (40 mg total) by mouth daily at 6 PM. 30 tablet 2  . clopidogrel (PLAVIX) 75 MG tablet Take 1 tablet (75 mg total) by mouth daily. 30 tablet 2  . hydrochlorothiazide (MICROZIDE) 12.5 MG capsule Take 1 capsule (12.5 mg total) by mouth daily. 30 capsule 2  . metFORMIN (GLUCOPHAGE) 500 MG tablet Take 1 tablet (500 mg total) by mouth 2 (two) times daily with a meal. 60 tablet 2  . carvedilol (COREG) 3.125 MG tablet Take 1 tablet (3.125 mg total) by mouth 2 (two) times daily. 180 tablet 3  . losartan (COZAAR) 25 MG tablet Take 1 tablet (25 mg total) by mouth daily. 90 tablet 3   No current facility-administered medications for this visit.    Allergies:   Shellfish allergy   Social History: Social  History   Socioeconomic History  . Marital status: Single    Spouse name: Not on file  . Number of children: Not on file  . Years of education: Not on file  . Highest education level: Not on file  Occupational History  . Not on file  Tobacco Use  . Smoking status: Never Smoker  . Smokeless tobacco: Never Used  Substance and Sexual Activity  . Alcohol use: Never  . Drug use: Never  . Sexual activity: Not on file  Other Topics Concern  . Not on file  Social History Narrative  . Not on file   Social Determinants of Health   Financial Resource Strain:   . Difficulty of Paying Living Expenses: Not on file  Food Insecurity:   . Worried About Charity fundraiser in the Last Year: Not on file  . Ran Out of Food in the Last Year: Not on file  Transportation Needs:   . Lack of Transportation (Medical): Not on file  . Lack of Transportation (Non-Medical): Not on file  Physical Activity:   . Days of Exercise per Week: Not on file  . Minutes of Exercise per Session: Not on file  Stress:   . Feeling of Stress : Not on file  Social Connections:   . Frequency of Communication with Friends and Family: Not on file  . Frequency of Social Gatherings with Friends and Family: Not on file  . Attends Religious Services: Not on file  .  Active Member of Clubs or Organizations: Not on file  . Attends Banker Meetings: Not on file  . Marital Status: Not on file  Intimate Partner Violence:   . Fear of Current or Ex-Partner: Not on file  . Emotionally Abused: Not on file  . Physically Abused: Not on file  . Sexually Abused: Not on file    Family History: Family History  Problem Relation Age of Onset  . COPD Mother   . Diabetes Mother   . Dementia Father   . Cancer Father   . Diabetes Father      Review of Systems: All other systems reviewed and are otherwise negative except as noted above.  Physical Exam: Vitals:   02/20/20 1141  BP: (!) 154/84  Pulse: 86  SpO2:  96%  Weight: 205 lb 12.8 oz (93.4 kg)  Height: 5\' 7"  (1.702 m)     GEN- The patient is well appearing, alert and oriented x 3 today.   HEENT: normocephalic, atraumatic; sclera clear, conjunctiva pink; hearing intact; oropharynx clear; neck supple  Lungs- Clear to ausculation bilaterally, normal work of breathing.  No wheezes, rales, rhonchi Heart- Regular rate and rhythm, no murmurs, rubs or gallops  GI- soft, non-tender, non-distended, bowel sounds present  Extremities- no clubbing, cyanosis, or edema  MS- no significant deformity or atrophy Skin- warm and dry, no rash or lesion; PPM pocket well healed Psych- euthymic mood, full affect Neuro- strength and sensation are intact  PPM Interrogation- transmission from overnight reviewed in detail today. No episodes.  EKG:  EKG is not ordered today.  Recent Labs: 01/20/2020: ALT 17; BUN 14; Creatinine, Ser 1.12; Hemoglobin 13.9; Platelets 268; Potassium 4.0; Sodium 141 01/21/2020: TSH 1.730   Wt Readings from Last 3 Encounters:  02/20/20 205 lb 12.8 oz (93.4 kg)  01/20/20 214 lb 8.1 oz (97.3 kg)     Other studies Reviewed: Additional studies/ records that were reviewed today include: Echo 01/21/20 shows LVEF 45-50%, Previous remote checks, Most recent labwork.   Assessment and Plan:  1. Cryptogenic stroke s/p Medtronic ILR No episodes by device Monitor transmitting nightly  2. Chronic systolic/diastolic CHF Suspected NICM and due to poorly controlled HTN Add losartan 25 mg daily Add coreg 3.125 mg BID Consider switching hctz for spiro at next visit.   3. HTN Will adjust meds as above in setting of managing his HF  4. Obesity Body mass index is 32.23 kg/m.  Encouraged exercise and sodium restriction  Current medicines are reviewed at length with the patient today.   The patient does not have concerns regarding his medicines.  The following changes were made today:  Adding losartan and coreg.   Labs/ tests ordered today  include:  No orders of the defined types were placed in this encounter.    Disposition:   Follow up with Dr. 01/23/20 as needed for ILR follow up. Will need follow up with gen cards for management of HF and HTN, recommend 3-4 weeks for continued med titration. Would consider Echo in 3-4 months after GDMT and management of BP, but EF borderline.   Graciela Husbands, PA-C  02/20/2020 12:03 PM  [facility] [address] [place] [phone] (office) [phone] (fax)

## 2020-02-27 ENCOUNTER — Ambulatory Visit (INDEPENDENT_AMBULATORY_CARE_PROVIDER_SITE_OTHER): Payer: PPO | Admitting: *Deleted

## 2020-02-27 DIAGNOSIS — I639 Cerebral infarction, unspecified: Secondary | ICD-10-CM

## 2020-02-27 LAB — CUP PACEART REMOTE DEVICE CHECK
Date Time Interrogation Session: 20210307025448
Implantable Pulse Generator Implant Date: 20210201

## 2020-02-27 NOTE — Progress Notes (Signed)
ILR Remote 

## 2020-02-29 DIAGNOSIS — H25813 Combined forms of age-related cataract, bilateral: Secondary | ICD-10-CM | POA: Diagnosis not present

## 2020-02-29 DIAGNOSIS — H35033 Hypertensive retinopathy, bilateral: Secondary | ICD-10-CM | POA: Diagnosis not present

## 2020-02-29 DIAGNOSIS — H53462 Homonymous bilateral field defects, left side: Secondary | ICD-10-CM | POA: Diagnosis not present

## 2020-02-29 DIAGNOSIS — E11319 Type 2 diabetes mellitus with unspecified diabetic retinopathy without macular edema: Secondary | ICD-10-CM | POA: Diagnosis not present

## 2020-02-29 DIAGNOSIS — H3581 Retinal edema: Secondary | ICD-10-CM | POA: Diagnosis not present

## 2020-03-05 NOTE — Progress Notes (Signed)
[facility] Note  03/07/2020     CHIEF COMPLAINT Patient presents for Retina Evaluation   HISTORY OF PRESENT ILLNESS: [name] is a 69 y.o. male who presents to the clinic today for:   HPI    Retina Evaluation    In both eyes.  This started weeks ago.  Duration of weeks.  Context:  distance vision and near vision.  I, the attending physician,  performed the HPI with the patient and updated documentation appropriately.          Comments    Pt states his vision is getting more blurry and has been over a period of several months.  Pt denies eye pain or discomfort and denies any new or worsening floaters or fol OU.  Unknown BS/A1c       Last edited by [name], MD on 03/07/2020  9:13 AM. (History)    pt is here on the referral of Dr. Wyatt Portela for HTN ret, pt had a stroke in January, he states he was not aware he had a stroke until someone told him, he states he had no vision loss or weakness, pt is diabetic (last A1c was 7.4 in Jan. 2021), takes metformin, pts daughter states that the dr in the hospital said his peripheral vision was damaged during the stroke, daughter states he has not had an eye exam in 69 years, daughter states pt wants to start driving again, daughter states while he was in the hospital he found out he has high blood pressure, diabetes, high cholesterol, and a weak heart, pt is going to see a neurologist on April 5, he does not monitor his BP at home  Referring physician: [name], MD [address] [place],  [place] [address]  HISTORICAL INFORMATION:   Selected notes from the [facility] Referred by Dr. Wyatt Portela for concern of hypertensive retinopathy LEE:  Ocular Hx- PMH-   CURRENT MEDICATIONS: Current Outpatient Medications (Ophthalmic Drugs)  Medication Sig  . prednisoLONE acetate (PRED FORTE) 1 % ophthalmic suspension Place 1 drop into the right eye 4 (four) times daily for 7 days.    No current facility-administered medications for this visit. (Ophthalmic Drugs)   Current Outpatient Medications (Other)  Medication Sig  . aspirin EC 325 MG EC tablet Take 1 tablet (325 mg total) by mouth daily.  Marland Kitchen atorvastatin (LIPITOR) 40 MG tablet Take 1 tablet (40 mg total) by mouth daily at 6 PM.  . carvedilol (COREG) 3.125 MG tablet Take 1 tablet (3.125 mg total) by mouth 2 (two) times daily.  . clopidogrel (PLAVIX) 75 MG tablet Take 1 tablet (75 mg total) by mouth daily.  . hydrochlorothiazide (MICROZIDE) 12.5 MG capsule Take 1 capsule (12.5 mg total) by mouth daily.  Marland Kitchen losartan (COZAAR) 25 MG tablet Take 1 tablet (25 mg total) by mouth daily.  . metFORMIN (GLUCOPHAGE) 500 MG tablet Take 1 tablet (500 mg total) by mouth 2 (two) times daily with a meal.   No current facility-administered medications for this visit. (Other)      REVIEW OF SYSTEMS: ROS    Positive for: Eyes   Negative for: Constitutional, Gastrointestinal, Neurological, Skin, Genitourinary, Musculoskeletal, HENT, Endocrine, Cardiovascular, Respiratory, Psychiatric, Allergic/Imm, Heme/Lymph   Last edited by Doneen Poisson on 03/07/2020  8:31 AM. (History)       ALLERGIES Allergies  Allergen Reactions  . Shellfish Allergy Anaphylaxis    PAST MEDICAL HISTORY History reviewed. No  pertinent past medical history. Past Surgical History:  Procedure Laterality Date  . LOOP RECORDER INSERTION N/A 01/23/2020   Procedure: LOOP RECORDER INSERTION;  Surgeon: Deboraha Sprang, MD;  Location: [facility];  Service: Cardiovascular;  Laterality: N/A;    FAMILY HISTORY Family History  Problem Relation Age of Onset  . COPD Mother   . Diabetes Mother   . Dementia Father   . Cancer Father   . Diabetes Father     SOCIAL HISTORY Social History   Tobacco Use  . Smoking status: Never Smoker  . Smokeless tobacco: Never Used  Substance Use Topics  . Alcohol use: Never  . Drug use: Never          OPHTHALMIC EXAM:  Base Eye Exam    Visual Acuity (Snellen - Linear)      Right Left   Dist [place] 20/40 -2 20/40 -2   Dist ph [place] 20/30 -2 20/20 -2       Tonometry (Tonopen, 8:48 AM)      Right Left   Pressure 17 17       Pupils      Dark Light Shape React APD   Right 3 2 Round Minimal 0   Left 3 2 Round Minimal 0       Visual Fields      Left Right    Full Full  Poor understanding of CVF testing       Extraocular Movement      Right Left    Full Full       Neuro/Psych    Oriented x3: Yes   Mood/Affect: Normal       Dilation    Both eyes: 1.0% Mydriacyl, 2.5% Phenylephrine @ 8:48 AM        Slit Lamp and Fundus Exam    Slit Lamp Exam      Right Left   Lids/Lashes Dermatochalasis - upper lid Dermatochalasis - upper lid   Conjunctiva/Sclera Nasal and temporal Pinguecula, mild Melanosis Nasal and temporal Pinguecula, mild Melanosis   Cornea Mild Arcus, 2+ Punctate epithelial erosions, Debris in tear film Mild Arcus, 3+ Punctate epithelial erosions, Debris in tear film   Anterior Chamber Deep and quiet Deep and quiet   Iris Round and dilated, No NVI Round and dilated, No NVI   Lens 2-3+ Nuclear sclerosis, 2-3+ Cortical cataract 2-3+ Nuclear sclerosis, 2-3+ Cortical cataract   Vitreous Vitreous syneresis Vitreous syneresis       Fundus Exam      Right Left   Disc Pink and Sharp, +cupping, central pallor Pink and Sharp   C/D Ratio 0.55 0.5   Macula Flat, Blunted foveal reflex, scattered MA, CWS inferior macula, No heme or edema Flat, Blunted foveal reflex, scattered Microaneurysms and exudate greatest inferior to fovea, +edema   Vessels Vascular attenuation, Tortuous Vascular attenuation, Tortuous   Periphery Attached, operculated hole at 1000 with partial pigment and +cuff of SRF, scattered MA, focal CWS just outside IT arcades Attached, scattered IRH and CWS, White without pressure temporally        Refraction    Manifest Refraction      Sphere Cylinder  Axis Dist VA   Right -1.25 +1.00 025 20/30-2   Left -1.25 +1.00 160 20/25          IMAGING AND PROCEDURES  Imaging and Procedures for @TODAY @  OCT, Retina - OU - Both Eyes       Right Eye Quality was good. Central Foveal  Thickness: 235. Progression has no prior data. Findings include normal foveal contour, no IRF, no SRF (Mild cystic changes, mild diffuse retinal thinning).   Left Eye Quality was good. Progression has no prior data. Findings include normal foveal contour, intraretinal fluid, intraretinal hyper-reflective material, no SRF, vitreomacular adhesion .   Notes *Images captured and stored on drive  Diagnosis / Impression:  OD: NFP, no IRF/SRF -- Mild cystic changes, mild diffuse retinal thinning OS: NFP, no SRF, +IRF  Clinical management:  See below  Abbreviations: NFP - Normal foveal profile. CME - cystoid macular edema. PED - pigment epithelial detachment. IRF - intraretinal fluid. SRF - subretinal fluid. EZ - ellipsoid zone. ERM - epiretinal membrane. ORA - outer retinal atrophy. ORT - outer retinal tubulation. SRHM - subretinal hyper-reflective material        Repair Retinal Breaks, Laser - OD - Right Eye       LASER PROCEDURE NOTE  Procedure:  Barrier laser retinopexy using slit lamp laser, RIGHT eye   Diagnosis:   Operculated retinal hole, RIGHT eye                     10 o'clock anterior to equator   Surgeon: [name], MD, PhD  Anesthesia: Topical  Informed consent obtained, operative eye marked, and time out performed prior to initiation of laser.   Laser settings:  Lumenis Smart532 laser, slit lamp Lens: Mainster PRP 165 Power: 240 mW Spot size: 200 microns Duration: 30 msec  # spots: 196  Placement of laser: Using a Mainster PRP 165 contact lens at the slit lamp, laser was placed in three confluent rows around operculated hole at 10 oclock anterior to equator with additional rows anteriorly.  Complications: None.  Patient  tolerated the procedure well and received written and verbal post-procedure care information/education.                  ASSESSMENT/PLAN:    ICD-10-CM   1. Retinal hole of right eye  H33.321 Repair Retinal Breaks, Laser - OD - Right Eye  2. Right retinal detachment  H33.21   3. Moderate nonproliferative diabetic retinopathy of both eyes with macular edema associated with type 2 diabetes mellitus ([place])  T73.2202   4. Retinal edema  H35.81 OCT, Retina - OU - Both Eyes  5. Essential hypertension  I10   6. Hypertensive retinopathy of both eyes  H35.033   7. Combined forms of age-related cataract of both eyes  H25.813   8. Dry eyes  H04.123     1,2. Operculated retinal hole w/ cuff of SRF / focal RD, right eye.    - The incidence, risk factors, and natural history of retinal tear was discussed with patient.    - Potential treatment options including laser retinopexy and cryotherapy discussed with patient.  - operculated hole located at 1000 with partial pigment and +cuff of SRF / focal RD  - recommend laser retinopexy OD today, 03.17.21  - RBA of procedure discussed, questions answered  - informed consent obtained and signed  - see procedure note  - start PF QID OD x7 days  - f/u in 1-2 wks  3,4. Moderate non-proliferative diabetic retinopathy, OU  - The incidence, risk factors for progression, natural history and treatment options for diabetic retinopathy  were discussed with patient.    - The need for close monitoring of blood glucose, blood pressure, and serum lipids, avoiding cigarette or any type of tobacco, and the need for long  term follow up was also discussed with patient.  - exam shows scattered MA/IRH/CWS OU  - OCT shows mild cystic changes / diabetic macular edema, both eyes   - The natural history, pathology, and characteristics of diabetic macular edema discussed with patient.  A generalized discussion of the major clinical trials concerning treatment of diabetic  macular edema (ETDRS, DCT, SCORE, RISE / RIDE, and ongoing DRCR net studies) was completed.  This discussion included mention of the various approaches to treating diabetic macular edema (observation, laser photocoagulation, anti-VEGF injections with lucentis / Avastin / Eylea, steroid injections with Kenalog / Ozurdex, and intraocular surgery with vitrectomy).  The goal hemoglobin A1C of 6-7 was discussed, as well as importance of smoking cessation and hypertension control.  Need for ongoing treatment and monitoring were specifically discussed with reference to chronic nature of diabetic macular edema.  - recommend holding off on IVA today due to recent stroke  - f/u in 1-2 weeks  5,6. Hypertensive retinopathy OU  - discussed importance of tight BP control  - monitor  7. Mixed form age related cataract OU  - The symptoms of cataract, surgical options, and treatments and risks were discussed with patient.  - discussed diagnosis and progression  - visually significant  - monitor  8. Dry eyes OU  - recommend artificial tears and lubricating ointment as needed  Ophthalmic Meds Ordered this visit:  Meds ordered this encounter  Medications  . prednisoLONE acetate (PRED FORTE) 1 % ophthalmic suspension    Sig: Place 1 drop into the right eye 4 (four) times daily for 7 days.    Dispense:  10 mL    Refill:  0      Return for 1-2 wks, Dilated Exam, OCT, Fluorescein Angiogram.  There are no Patient Instructions on file for this visit.   Explained the diagnoses, plan, and follow up with the patient and they expressed understanding.  Patient expressed understanding of the importance of proper follow up care.   This document serves as a record of services personally performed by Gardiner Sleeper, MD, PhD. It was created on their behalf by Ernest Mallick, OA, an ophthalmic assistant. The creation of this record is the provider's dictation and/or activities during the visit.    Electronically  signed by: Ernest Mallick, OA 03.17.2021 12:40 AM   Gardiner Sleeper, M.D., Ph.D. Diseases & Surgery of the Retina and Vitreous [facility]  I have reviewed the above documentation for accuracy and completeness, and I agree with the above. Gardiner Sleeper, M.D., Ph.D. 03/11/20 12:43 AM   Abbreviations: M myopia (nearsighted); A astigmatism; H hyperopia (farsighted); P presbyopia; Mrx spectacle prescription;  CTL contact lenses; OD right eye; OS left eye; OU both eyes  XT exotropia; ET esotropia; PEK punctate epithelial keratitis; PEE punctate epithelial erosions; DES dry eye syndrome; MGD meibomian gland dysfunction; ATs artificial tears; PFAT's preservative free artificial tears; [place] nuclear sclerotic cataract; PSC posterior subcapsular cataract; ERM epi-retinal membrane; PVD posterior vitreous detachment; RD retinal detachment; DM diabetes mellitus; DR diabetic retinopathy; NPDR non-proliferative diabetic retinopathy; PDR proliferative diabetic retinopathy; CSME clinically significant macular edema; DME diabetic macular edema; dbh dot blot hemorrhages; CWS cotton wool spot; POAG primary open angle glaucoma; C/D cup-to-disc ratio; HVF humphrey visual field; GVF goldmann visual field; OCT optical coherence tomography; IOP intraocular pressure; BRVO Branch retinal vein occlusion; CRVO central retinal vein occlusion; CRAO central retinal artery occlusion; BRAO branch retinal artery occlusion; RT retinal tear; SB scleral buckle;  PPV pars plana vitrectomy; VH Vitreous hemorrhage; PRP panretinal laser photocoagulation; IVK intravitreal kenalog; VMT vitreomacular traction; MH Macular hole;  NVD neovascularization of the disc; NVE neovascularization elsewhere; AREDS age related eye disease study; ARMD age related macular degeneration; POAG primary open angle glaucoma; EBMD epithelial/anterior basement membrane dystrophy; ACIOL anterior chamber intraocular lens; IOL intraocular lens; PCIOL  posterior chamber intraocular lens; Phaco/IOL phacoemulsification with intraocular lens placement; [place] photorefractive keratectomy; LASIK laser assisted in situ keratomileusis; HTN hypertension; DM diabetes mellitus; COPD chronic obstructive pulmonary disease

## 2020-03-07 ENCOUNTER — Encounter (INDEPENDENT_AMBULATORY_CARE_PROVIDER_SITE_OTHER): Payer: Self-pay | Admitting: Ophthalmology

## 2020-03-07 ENCOUNTER — Ambulatory Visit (INDEPENDENT_AMBULATORY_CARE_PROVIDER_SITE_OTHER): Payer: 59 | Admitting: Ophthalmology

## 2020-03-07 DIAGNOSIS — H25813 Combined forms of age-related cataract, bilateral: Secondary | ICD-10-CM | POA: Diagnosis not present

## 2020-03-07 DIAGNOSIS — H35033 Hypertensive retinopathy, bilateral: Secondary | ICD-10-CM

## 2020-03-07 DIAGNOSIS — H3581 Retinal edema: Secondary | ICD-10-CM | POA: Diagnosis not present

## 2020-03-07 DIAGNOSIS — I10 Essential (primary) hypertension: Secondary | ICD-10-CM | POA: Diagnosis not present

## 2020-03-07 DIAGNOSIS — H04123 Dry eye syndrome of bilateral lacrimal glands: Secondary | ICD-10-CM

## 2020-03-07 DIAGNOSIS — H33321 Round hole, right eye: Secondary | ICD-10-CM

## 2020-03-07 DIAGNOSIS — H3321 Serous retinal detachment, right eye: Secondary | ICD-10-CM | POA: Diagnosis not present

## 2020-03-07 DIAGNOSIS — E113313 Type 2 diabetes mellitus with moderate nonproliferative diabetic retinopathy with macular edema, bilateral: Secondary | ICD-10-CM

## 2020-03-07 MED ORDER — PREDNISOLONE ACETATE 1 % OP SUSP: 1.0000 [drp] | Freq: Four times a day (QID) | OPHTHALMIC | 0 refills | Status: AC

## 2020-03-12 NOTE — Telephone Encounter (Signed)
Called the patient and LMTCB regarding him taking an excessive amount of his meds over the weekend.   Called the patients daughter-Beverly who originally messaged A [facility] on Friday with details regarding the patient taking too many of his prescribed pills.  [name] states that the patient was very tired over the weekend with decreased appetite. She held all his medications over the weekend but resumed his normal regimen this morning. The patient is beginning to feel much better. She denies any CP, SOB, light headedness, or syncope over the weekend.  [name] reports that she was originally leaving two days worth of medications out for her dad but is nowgoing over daily in order to give him his meds herself. [name] denies that her father has been depressed, suicidal or has any thoughts of self harm at this time. She states that is was simply confusion because he had multiple days of pills at his disposal which she will not do again. Advised her to give [place] a call if there is anything else we can do to help. She verbalized understanding and thanked me for the call.

## 2020-03-14 NOTE — Progress Notes (Signed)
Cardiology Office Note   Date:  03/15/2020   ID:  [name], DOB 1951/01/24, MRN [id number]  PCP:  System, Pcp Not In  Cardiologist:   Dorris Carnes, MD   Pt presents to establish in general cardiology for f/u of HTN    History of Present Illness: [name] is a 69 y.o. male with a history of HTN, Type II DM and CVA  Hosp in Jan 2021 with progressive confusion.  Found to have an acute R MCA infarct  Echo showed LVEF was 45 to 50% with mild global hypokinesis.   There was no significant valvular abnormalities     Since d/c the pt was seen by A Tillery in EP (has ILR)  The pt and daughter are here    He says his breathing is OK   He denies ever having CP    Daughter concerned because his wt is down about 30 lbs since Jan.   He denies nausea,   Appetite is OK  Is eating   Has had diarrhea for the past few days          Current Meds  Medication Sig  . aspirin EC 325 MG EC tablet Take 1 tablet (325 mg total) by mouth daily.  Marland Kitchen atorvastatin (LIPITOR) 40 MG tablet Take 1 tablet (40 mg total) by mouth daily at 6 PM.  . carvedilol (COREG) 3.125 MG tablet Take 1 tablet (3.125 mg total) by mouth 2 (two) times daily.  . clopidogrel (PLAVIX) 75 MG tablet Take 1 tablet (75 mg total) by mouth daily.  . hydrochlorothiazide (MICROZIDE) 12.5 MG capsule Take 1 capsule (12.5 mg total) by mouth daily.  Marland Kitchen losartan (COZAAR) 25 MG tablet Take 1 tablet (25 mg total) by mouth daily.  . metFORMIN (GLUCOPHAGE) 500 MG tablet Take 1 tablet (500 mg total) by mouth 2 (two) times daily with a meal.     Allergies:   Shellfish allergy   No past medical history on file.  Past Surgical History:  Procedure Laterality Date  . LOOP RECORDER INSERTION N/A 01/23/2020   Procedure: LOOP RECORDER INSERTION;  Surgeon: Deboraha Sprang, MD;  Location: [facility];  Service: Cardiovascular;  Laterality: N/A;     Social History:  The patient  reports that he has never smoked. He has never used  smokeless tobacco. He reports that he does not drink alcohol or use drugs.   Family History:  The patient's family history includes COPD in his mother; Cancer in his father; Dementia in his father; Diabetes in his father and mother.    ROS:  Please see the history of present illness. All other systems are reviewed and  Negative to the above problem except as noted.    PHYSICAL EXAM: VS:  BP (!) 148/72   Pulse 63   Ht 5\' 7"  (1.702 m)   Wt 189 lb 12.8 oz (86.1 kg)   SpO2 99%   BMI 29.73 kg/m   GEN: Well nourished, well developed, in no acute distress  HEENT: normal  Neck: no JVD, No carotid bruits Cardiac: RRR; no murmurs, rubs, or gallops,no LE  edema  Respiratory:  clear to auscultation bilaterally, normal work of breathing GI: soft, nontender, nondistended, + BS  No hepatomegaly  MS: no deformity Moving all extremities   Skin: warm and dry, no rash Neuro  Deferred Psych: euthymic mood, full affect   EKG:  EKG is ordered today.   Lipid Panel    Component Value Date/Time  CHOL 172 01/21/2020 0214   TRIG 77 01/21/2020 0214   HDL 28 (L) 01/21/2020 0214   CHOLHDL 6.1 01/21/2020 0214   VLDL 15 01/21/2020 0214   LDLCALC 129 (H) 01/21/2020 0214      Wt Readings from Last 3 Encounters:  03/15/20 189 lb 12.8 oz (86.1 kg)  02/20/20 205 lb 12.8 oz (93.4 kg)  01/20/20 214 lb 8.1 oz (97.3 kg)      ASSESSMENT AND PLAN:  1  CVA   Pt recently hospitalized Now on ASA and plavix  Has ILR   Followed in EP  Continue   2   Mild LV dysfunction   Pt without CP  VOlume status is OK   I would continue medical Rx   Plan to reassess LV function later this summer   Keep on same meds    3  HTN   BP is high today   Will increase Cozaar to 50 mg   4  HL   Now on statin   WIll check lpids   Check:  CBC , BMET, lpids, liver panel and A1C  Plan for f/u in 2 months      Current medicines are reviewed at length with the patient today.  The patient does not have concerns regarding  medicines.  Signed, Dietrich Pates, MD  03/15/2020 4:32 PM    [facility] [address] [place], [place], [place]  [address] Phone: [phone]; Fax: [phone]

## 2020-03-15 ENCOUNTER — Ambulatory Visit: Payer: PPO | Admitting: Internal Medicine

## 2020-03-15 ENCOUNTER — Encounter: Payer: Self-pay | Admitting: Internal Medicine

## 2020-03-15 ENCOUNTER — Other Ambulatory Visit: Payer: Self-pay

## 2020-03-15 VITALS — BP 148/72 | HR 63 | Ht 67.0 in | Wt 189.8 lb

## 2020-03-15 DIAGNOSIS — R7309 Other abnormal glucose: Secondary | ICD-10-CM | POA: Diagnosis not present

## 2020-03-15 DIAGNOSIS — I5042 Chronic combined systolic (congestive) and diastolic (congestive) heart failure: Secondary | ICD-10-CM | POA: Diagnosis not present

## 2020-03-15 DIAGNOSIS — I10 Essential (primary) hypertension: Secondary | ICD-10-CM

## 2020-03-15 MED ORDER — LOSARTAN POTASSIUM 50 MG PO TABS: 50.0000 mg | ORAL_TABLET | Freq: Every day | ORAL | 3 refills | Status: DC

## 2020-03-15 NOTE — Patient Instructions (Signed)
Medication Instructions:  Your physician has recommended you make the following change in your medication:  1.) increase losartan (Cozaar) to 50 mg daily  *If you need a refill on your cardiac medications before your next appointment, please call your pharmacy*   Lab Work: Today: cbc, bmet, lipids, liver funciton, hgA1c  If you have labs (blood work) drawn today and your tests are completely normal, you will receive your results only by: Marland Kitchen MyChart Message (if you have MyChart) OR  If you have any lab test that is abnormal or we need to change your treatment, we will call you to review the results.   Testing/Procedures: none   Follow-Up: At [facility], you and your health needs are our priority.  As part of our continuing mission to provide you with exceptional heart care, we have created designated Provider Care Teams.  These Care Teams include your primary Cardiologist (physician) and Advanced Practice Providers (APPs -  Physician Assistants and Nurse Practitioners) who all work together to provide you with the care you need, when you need it.  We recommend signing up for the patient portal called "MyChart".  Sign up information is provided on this After Visit Summary.  MyChart is used to connect with patients for Virtual Visits (Telemedicine).  Patients are able to view lab/test results, encounter notes, upcoming appointments, etc.  Non-urgent messages can be sent to your provider as well.   To learn more about what you can do with MyChart, go to [email or web address].    Your next appointment:   2 month(s)  The format for your next appointment:   In Person  Provider:   You may see Dietrich Pates, MD or one of the following Advanced Practice Providers on your designated Care Team:    Tereso Newcomer, PA-C  Chelsea Aus, [place]     Other Instructions

## 2020-03-16 ENCOUNTER — Telehealth: Payer: Self-pay

## 2020-03-16 ENCOUNTER — Ambulatory Visit (INDEPENDENT_AMBULATORY_CARE_PROVIDER_SITE_OTHER): Payer: PPO | Admitting: Ophthalmology

## 2020-03-16 ENCOUNTER — Encounter (INDEPENDENT_AMBULATORY_CARE_PROVIDER_SITE_OTHER): Payer: Self-pay | Admitting: Ophthalmology

## 2020-03-16 DIAGNOSIS — E113313 Type 2 diabetes mellitus with moderate nonproliferative diabetic retinopathy with macular edema, bilateral: Secondary | ICD-10-CM

## 2020-03-16 DIAGNOSIS — H04123 Dry eye syndrome of bilateral lacrimal glands: Secondary | ICD-10-CM | POA: Diagnosis not present

## 2020-03-16 DIAGNOSIS — I10 Essential (primary) hypertension: Secondary | ICD-10-CM

## 2020-03-16 DIAGNOSIS — H3581 Retinal edema: Secondary | ICD-10-CM

## 2020-03-16 DIAGNOSIS — Z79899 Other long term (current) drug therapy: Secondary | ICD-10-CM

## 2020-03-16 DIAGNOSIS — H25813 Combined forms of age-related cataract, bilateral: Secondary | ICD-10-CM

## 2020-03-16 DIAGNOSIS — H33321 Round hole, right eye: Secondary | ICD-10-CM | POA: Diagnosis not present

## 2020-03-16 DIAGNOSIS — H35033 Hypertensive retinopathy, bilateral: Secondary | ICD-10-CM

## 2020-03-16 LAB — BASIC METABOLIC PANEL
BUN/Creatinine Ratio: 16 (ref 10–24)
BUN: 29 mg/dL — ABNORMAL HIGH (ref 8–27)
CO2: 25 mmol/L (ref 20–29)
Calcium: 9.7 mg/dL (ref 8.6–10.2)
Chloride: 104 mmol/L (ref 96–106)
Creatinine, Ser: 1.86 mg/dL — ABNORMAL HIGH (ref 0.76–1.27)
GFR calc Af Amer: 43 mL/min/{1.73_m2} — ABNORMAL LOW (ref >59)
GFR calc non Af Amer: 37 mL/min/{1.73_m2} — ABNORMAL LOW (ref >59)
Glucose: 116 mg/dL — ABNORMAL HIGH (ref 65–99)
Potassium: 3.4 mmol/L — ABNORMAL LOW (ref 3.5–5.2)
Sodium: 144 mmol/L (ref 134–144)

## 2020-03-16 LAB — HEPATIC FUNCTION PANEL
ALT: 10 IU/L (ref 0–44)
AST: 18 IU/L (ref 0–40)
Albumin: 4 g/dL (ref 3.8–4.8)
Alkaline Phosphatase: 52 IU/L (ref 39–117)
Bilirubin Total: 0.4 mg/dL (ref 0.0–1.2)
Bilirubin, Direct: 0.17 mg/dL (ref 0.00–0.40)
Total Protein: 6.9 g/dL (ref 6.0–8.5)

## 2020-03-16 LAB — CBC
Hematocrit: 37.4 % — ABNORMAL LOW (ref 37.5–51.0)
Hemoglobin: 12.3 g/dL — ABNORMAL LOW (ref 13.0–17.7)
MCH: 28.3 pg (ref 26.6–33.0)
MCHC: 32.9 g/dL (ref 31.5–35.7)
MCV: 86 fL (ref 79–97)
Platelets: 287 10*3/uL (ref 150–450)
RBC: 4.34 x10E6/uL (ref 4.14–5.80)
RDW: 13.6 % (ref 11.6–15.4)
WBC: 5.7 10*3/uL (ref 3.4–10.8)

## 2020-03-16 LAB — LIPID PANEL
Chol/HDL Ratio: 3.2 ratio (ref 0.0–5.0)
Cholesterol, Total: 105 mg/dL (ref 100–199)
HDL: 33 mg/dL — ABNORMAL LOW (ref >39)
LDL Chol Calc (NIH): 54 mg/dL (ref 0–99)
Triglycerides: 89 mg/dL (ref 0–149)
VLDL Cholesterol Cal: 18 mg/dL (ref 5–40)

## 2020-03-16 LAB — HEMOGLOBIN A1C
Est. average glucose Bld gHb Est-mCnc: 148 mg/dL
Hgb A1c MFr Bld: 6.8 % — ABNORMAL HIGH (ref 4.8–5.6)

## 2020-03-16 NOTE — Progress Notes (Signed)
[facility] - Clinic Note  03/16/2020     CHIEF COMPLAINT Patient presents for Retina Follow Up   HISTORY OF PRESENT ILLNESS: [name] is a 69 y.o. male who presents to the clinic today for:   HPI    Retina Follow Up    Patient presents with  Other.  In both eyes.  This started 1 week ago.  Severity is moderate.  I, the attending physician,  performed the HPI with the patient and updated documentation appropriately.          Comments    Patient here for 1 week retina follow up for FA. Patient states vision doing ok. No eye pain.        Last edited by Rennis Chris, MD on 03/16/2020 11:05 PM. (History)    Patient is here for 1 week follow-up for FA. Patient states his vision is doing OK.  Referring physician: No referring provider defined for this encounter.  HISTORICAL INFORMATION:   Selected notes from the MEDICAL RECORD NUMBER Referred by Dr. Fabian Sharp for concern of hypertensive retinopathy LEE:  Ocular Hx- PMH-   CURRENT MEDICATIONS: No current outpatient medications on file. (Ophthalmic Drugs)   No current facility-administered medications for this visit. (Ophthalmic Drugs)   Current Outpatient Medications (Other)  Medication Sig  . aspirin EC 325 MG EC tablet Take 1 tablet (325 mg total) by mouth daily.  Marland Kitchen atorvastatin (LIPITOR) 40 MG tablet Take 1 tablet (40 mg total) by mouth daily at 6 PM.  . carvedilol (COREG) 3.125 MG tablet Take 1 tablet (3.125 mg total) by mouth 2 (two) times daily.  . clopidogrel (PLAVIX) 75 MG tablet Take 1 tablet (75 mg total) by mouth daily.  Marland Kitchen losartan (COZAAR) 50 MG tablet Take 1 tablet (50 mg total) by mouth daily.  . metFORMIN (GLUCOPHAGE) 500 MG tablet Take 1 tablet (500 mg total) by mouth 2 (two) times daily with a meal.   No current facility-administered medications for this visit. (Other)      REVIEW OF SYSTEMS: ROS    Positive for: Eyes   Negative for: Constitutional, Gastrointestinal,  Neurological, Skin, Genitourinary, Musculoskeletal, HENT, Endocrine, Cardiovascular, Respiratory, Psychiatric, Allergic/Imm, Heme/Lymph   Last edited by Laddie Aquas, COA on 03/16/2020  9:14 AM. (History)       ALLERGIES Allergies  Allergen Reactions  . Shellfish Allergy Anaphylaxis    PAST MEDICAL HISTORY History reviewed. No pertinent past medical history. Past Surgical History:  Procedure Laterality Date  . LOOP RECORDER INSERTION N/A 01/23/2020   Procedure: LOOP RECORDER INSERTION;  Surgeon: Duke Salvia, MD;  Location: [facility] INVASIVE CV LAB;  Service: Cardiovascular;  Laterality: N/A;    FAMILY HISTORY Family History  Problem Relation Age of Onset  . COPD Mother   . Diabetes Mother   . Dementia Father   . Cancer Father   . Diabetes Father     SOCIAL HISTORY Social History   Tobacco Use  . Smoking status: Never Smoker  . Smokeless tobacco: Never Used  Substance Use Topics  . Alcohol use: Never  . Drug use: Never         OPHTHALMIC EXAM:  Base Eye Exam    Visual Acuity (Snellen - Linear)      Right Left   Dist [place] 20/40 -1 20/30   Dist ph [place] NI 20/25 -2       Tonometry (Tonopen, 9:11 AM)      Right Left   Pressure  14 15       Pupils      Dark Light Shape React APD   Right 3 2 Round Brisk None   Left 3 2 Round Brisk None       Visual Fields (Counting fingers)      Left Right    Full Full       Extraocular Movement      Right Left    Full Full       Neuro/Psych    Oriented x3: Yes   Mood/Affect: Normal       Dilation    Both eyes: 1.0% Mydriacyl, 2.5% Phenylephrine @ 9:11 AM        Slit Lamp and Fundus Exam    Slit Lamp Exam      Right Left   Lids/Lashes Dermatochalasis - upper lid Dermatochalasis - upper lid   Conjunctiva/Sclera Nasal and temporal Pinguecula, mild Melanosis Nasal and temporal Pinguecula, mild Melanosis   Cornea Mild Arcus, 2+ Punctate epithelial erosions, Debris in tear film Mild Arcus, 3+ Punctate epithelial  erosions, Debris in tear film   Anterior Chamber Deep and quiet Deep and quiet   Iris Round and dilated, No NVI Round and dilated, No NVI   Lens 2-3+ Nuclear sclerosis, 2-3+ Cortical cataract 2-3+ Nuclear sclerosis, 2-3+ Cortical cataract   Vitreous Vitreous syneresis Vitreous syneresis       Fundus Exam      Right Left   Disc Pink and Sharp, +cupping, central pallor Pink and Sharp   C/D Ratio 0.55 0.5   Macula Flat, Blunted foveal reflex, scattered MA, CWS inferior macula, No edema Flat, Blunted foveal reflex, scattered Microaneurysms and exudate greatest inferior to fovea, +edema   Vessels Vascular attenuation, Tortuous, severe attenuation temporal periphery  Vascular attenuation, Tortuous, severe attenuation temporal perhiphery   Periphery Attached, operculated hole at 1000 with good early laser changes surrounding, not mature, partial pigment and +cuff of SRF, scattered MA, focal CWS just outside IT arcades Attached, scattered IRH and CWS, White without pressure temporally, focal pigmented CR scar at 0130 equator          IMAGING AND PROCEDURES  Imaging and Procedures for @TODAY @  OCT, Retina - OU - Both Eyes       Right Eye Quality was good. Central Foveal Thickness: 233. Progression has been stable. Findings include normal foveal contour, no IRF, no SRF (Mild cystic changes, mild diffuse retinal thinning--stable).   Left Eye Quality was good. Central Foveal Thickness: 252. Progression has improved. Findings include normal foveal contour, intraretinal fluid, intraretinal hyper-reflective material, no SRF, vitreomacular adhesion  (Mild interval improvement in IRF/retinal thickening).   Notes *Images captured and stored on drive  Diagnosis / Impression:  OD: NFP, no IRF/SRF -- Mild cystic changes, mild diffuse retinal thinning OS: NFP, no SRF, +IRF--mild interval improvement in IRF/retinal thickening  Clinical management:  See below  Abbreviations: NFP - Normal foveal  profile. CME - cystoid macular edema. PED - pigment epithelial detachment. IRF - intraretinal fluid. SRF - subretinal fluid. EZ - ellipsoid zone. ERM - epiretinal membrane. ORA - outer retinal atrophy. ORT - outer retinal tubulation. SRHM - subretinal hyper-reflective material        Fluorescein Angiography Optos (Transit OD)       Right Eye   Progression has no prior data. Early phase findings include vascular perfusion defect, delayed filling, microaneurysm. Mid/Late phase findings include vascular perfusion defect, leakage, microaneurysm (Vascular perfusion defect greatest and infero temporal perihery and supero  nasal mid-zone).   Left Eye   Progression has no prior data. Early phase findings include vascular perfusion defect, microaneurysm. Mid/Late phase findings include microaneurysm, vascular perfusion defect, leakage (Significant vascular perfusion greatest temporal periphery and nasal mid-zone).   Notes Images stored on drive;   Impression: moderate to severe NPDR OU late leaking MA OU significant capillary drop-out OU No NV OU                  ASSESSMENT/PLAN:    ICD-10-CM   1. Retinal hole of right eye  H33.321   2. Retinal edema  H35.81 OCT, Retina - OU - Both Eyes  3. Moderate nonproliferative diabetic retinopathy of both eyes with macular edema associated with type 2 diabetes mellitus (HCC)  C78.9381   4. Essential hypertension  I10   5. Hypertensive retinopathy of both eyes  H35.033 Fluorescein Angiography Optos (Transit OD)  6. Combined forms of age-related cataract of both eyes  H25.813   7. Dry eyes  H04.123     1,2. Operculated retinal hole w/ cuff of SRF / focal RD, right eye.    - The incidence, risk factors, and natural history of retinal tear was discussed with patient.    - Potential treatment options including laser retinopexy and cryotherapy discussed with patient.  - operculated hole located at 1000 with partial pigment and +cuff of SRF  / focal RD  - recommend laser retinopexy OD today, 03.17.21  - RBA of procedure discussed, questions answered  - informed consent obtained and signed  - see procedure note  - start PF QID OD x7 days  - f/u in 2-3 wks  3. Moderate to severe non-proliferative diabetic retinopathy, OU  - The incidence, risk factors for progression, natural history and treatment options for diabetic retinopathy  were discussed with patient.    - The need for close monitoring of blood glucose, blood pressure, and serum lipids, avoiding cigarette or any type of tobacco, and the need for long term follow up was also discussed with patient.  - exam shows scattered MA/IRH/CWS OU  - OCT shows mild cystic changes, mild diffuse retinal thinning OD, mild interval improvement in IRF/retinal thickening OS  - FA today (03.26.21) shows late leaking MA OU, significant capillary drop-out OU  - DME improving OS, will monitor for now  - recommend PRP OU for peripheral nonperfusion, OS first  - f/u 2-3 weeks PRP OS  4,5. Hypertensive retinopathy OU  - discussed importance of tight BP control  - monitor  6. Mixed form age related cataract OU  - The symptoms of cataract, surgical options, and treatments and risks were discussed with patient.  - discussed diagnosis and progression  - visually significant  - monitor  7. Dry eyes OU  - recommend artificial tears and lubricating ointment as needed  Ophthalmic Meds Ordered this visit:  No orders of the defined types were placed in this encounter.     Return in about 2 weeks (around 03/30/2020) for PRP OS.  There are no Patient Instructions on file for this visit.   Explained the diagnoses, plan, and follow up with the patient and they expressed understanding.  Patient expressed understanding of the importance of proper follow up care.   This document serves as a record of services personally performed by Karie Chimera, MD, PhD. It was created on their behalf by Annalee Genta, COMT. The creation of this record is the provider's dictation and/or activities during the visit.  Electronically  signed by: Annalee Genta, COMT 03/16/20 11:10 PM   Karie Chimera, M.D., Ph.D. Diseases & Surgery of the Retina and Vitreous [facility]  I have reviewed the above documentation for accuracy and completeness, and I agree with the above. Karie Chimera, M.D., Ph.D. 03/16/20 11:10 PM   Abbreviations: M myopia (nearsighted); A astigmatism; H hyperopia (farsighted); P presbyopia; Mrx spectacle prescription;  CTL contact lenses; OD right eye; OS left eye; OU both eyes  XT exotropia; ET esotropia; PEK punctate epithelial keratitis; PEE punctate epithelial erosions; DES dry eye syndrome; MGD meibomian gland dysfunction; ATs artificial tears; PFAT's preservative free artificial tears; NSC nuclear sclerotic cataract; PSC posterior subcapsular cataract; ERM epi-retinal membrane; PVD posterior vitreous detachment; RD retinal detachment; DM diabetes mellitus; DR diabetic retinopathy; NPDR non-proliferative diabetic retinopathy; PDR proliferative diabetic retinopathy; CSME clinically significant macular edema; DME diabetic macular edema; dbh dot blot hemorrhages; CWS cotton wool spot; POAG primary open angle glaucoma; C/D cup-to-disc ratio; HVF humphrey visual field; GVF goldmann visual field; OCT optical coherence tomography; IOP intraocular pressure; BRVO Branch retinal vein occlusion; CRVO central retinal vein occlusion; CRAO central retinal artery occlusion; BRAO branch retinal artery occlusion; RT retinal tear; SB scleral buckle; PPV pars plana vitrectomy; VH Vitreous hemorrhage; PRP panretinal laser photocoagulation; IVK intravitreal kenalog; VMT vitreomacular traction; MH Macular hole;  NVD neovascularization of the disc; NVE neovascularization elsewhere; AREDS age related eye disease study; ARMD age related macular degeneration; POAG primary open angle glaucoma;  EBMD epithelial/anterior basement membrane dystrophy; ACIOL anterior chamber intraocular lens; IOL intraocular lens; PCIOL posterior chamber intraocular lens; Phaco/IOL phacoemulsification with intraocular lens placement; PRK photorefractive keratectomy; LASIK laser assisted in situ keratomileusis; HTN hypertension; DM diabetes mellitus; COPD chronic obstructive pulmonary disease

## 2020-03-16 NOTE — Telephone Encounter (Signed)
-----   Message from Dietrich Pates V, MD sent at 03/16/2020 11:14 AM EDT ----- Lipids are excellent  Stay on atorvastatin Potassium a little low and Cr up a little Stop HCTZ     Check BMET in 1 wk   Note yesterday in clinic I told him to increase Cozaar to 50 mg

## 2020-03-16 NOTE — Telephone Encounter (Signed)
The patient has been notified of the lab result and verbalized understanding.  All questions (if any) were answered. Sigurd Sos, RN 03/16/2020 11:36 AM

## 2020-03-23 ENCOUNTER — Other Ambulatory Visit: Payer: PPO | Admitting: *Deleted

## 2020-03-23 ENCOUNTER — Other Ambulatory Visit: Payer: Self-pay

## 2020-03-23 DIAGNOSIS — Z79899 Other long term (current) drug therapy: Secondary | ICD-10-CM

## 2020-03-23 DIAGNOSIS — I10 Essential (primary) hypertension: Secondary | ICD-10-CM

## 2020-03-23 LAB — BASIC METABOLIC PANEL
BUN/Creatinine Ratio: 13 (ref 10–24)
BUN: 15 mg/dL (ref 8–27)
CO2: 28 mmol/L (ref 20–29)
Calcium: 9.1 mg/dL (ref 8.6–10.2)
Chloride: 103 mmol/L (ref 96–106)
Creatinine, Ser: 1.18 mg/dL (ref 0.76–1.27)
GFR calc Af Amer: 74 mL/min/{1.73_m2} (ref >59)
GFR calc non Af Amer: 64 mL/min/{1.73_m2} (ref >59)
Glucose: 116 mg/dL — ABNORMAL HIGH (ref 65–99)
Potassium: 3.8 mmol/L (ref 3.5–5.2)
Sodium: 141 mmol/L (ref 134–144)

## 2020-03-26 ENCOUNTER — Other Ambulatory Visit: Payer: Self-pay

## 2020-03-26 ENCOUNTER — Ambulatory Visit: Payer: PPO | Admitting: Neurology

## 2020-03-26 ENCOUNTER — Encounter: Payer: Self-pay | Admitting: Neurology

## 2020-03-26 VITALS — BP 153/77 | HR 68 | Temp 97.5°F | Ht 67.0 in | Wt 200.4 lb

## 2020-03-26 DIAGNOSIS — H53462 Homonymous bilateral field defects, left side: Secondary | ICD-10-CM

## 2020-03-26 DIAGNOSIS — I639 Cerebral infarction, unspecified: Secondary | ICD-10-CM
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
## 2020-03-26 NOTE — Patient Instructions (Signed)
I had a long d/w patient and his daughter about his recent cryptogenic strokes, risk for recurrent stroke/TIAs, personally independently reviewed imaging studies and stroke evaluation results and answered questions.Continue aspirin 81 mg and Plavix 75 mg daily for 1 more month and then stop Plavix and stay on aspirin alone for secondary stroke prevention and maintain strict control of hypertension with blood pressure goal below 130/90, diabetes with hemoglobin A1c goal below 6.5% and lipids with LDL cholesterol goal below 70 mg/dL. I also advised the patient to eat a healthy diet with plenty of whole grains, cereals, fruits and vegetables, exercise regularly and maintain ideal body weight.  I advised the patient to follow-up closely with primary care physician for stricter blood pressure control.  He was also advised not to drive till his peripheral vision improves followup in the future with my nurse practitioner Janett Billow in 6 months or call earlier if necessary.  Stroke Prevention Some medical conditions and behaviors are associated with a higher chance of having a stroke. You can help prevent a stroke by making nutrition, lifestyle, and other changes, including managing any medical conditions you may have. What nutrition changes can be made?   Eat healthy foods. You can do this by: ? Choosing foods high in fiber, such as fresh fruits and vegetables and whole grains. ? Eating at least 5 or more servings of fruits and vegetables a day. Try to fill half of your plate at each meal with fruits and vegetables. ? Choosing lean protein foods, such as lean cuts of meat, poultry without skin, fish, tofu, beans, and nuts. ? Eating low-fat dairy products. ? Avoiding foods that are high in salt (sodium). This can help lower blood pressure. ? Avoiding foods that have saturated fat, trans fat, and cholesterol. This can help prevent high cholesterol. ? Avoiding processed and premade foods.  Follow your health care  provider's specific guidelines for losing weight, controlling high blood pressure (hypertension), lowering high cholesterol, and managing diabetes. These may include: ? Reducing your daily calorie intake. ? Limiting your daily sodium intake to 1,500 milligrams (mg). ? Using only healthy fats for cooking, such as olive oil, canola oil, or sunflower oil. ? Counting your daily carbohydrate intake. What lifestyle changes can be made?  Maintain a healthy weight. Talk to your health care provider about your ideal weight.  Get at least 30 minutes of moderate physical activity at least 5 days a week. Moderate activity includes brisk walking, biking, and swimming.  Do not use any products that contain nicotine or tobacco, such as cigarettes and e-cigarettes. If you need help quitting, ask your health care provider. It may also be helpful to avoid exposure to secondhand smoke.  Limit alcohol intake to no more than 1 drink a day for nonpregnant women and 2 drinks a day for men. One drink equals 12 oz of beer, 5 oz of wine, or 1 oz of hard liquor.  Stop any illegal drug use.  Avoid taking birth control pills. Talk to your health care provider about the risks of taking birth control pills if: ? You are over 59 years old. ? You smoke. ? You get migraines. ? You have ever had a blood clot. What other changes can be made?  Manage your cholesterol levels. ? Eating a healthy diet is important for preventing high cholesterol. If cholesterol cannot be managed through diet alone, you may also need to take medicines. ? Take any prescribed medicines to control your cholesterol as told by your  health care provider.  Manage your diabetes. ? Eating a healthy diet and exercising regularly are important parts of managing your blood sugar. If your blood sugar cannot be managed through diet and exercise, you may need to take medicines. ? Take any prescribed medicines to control your diabetes as told by your health  care provider.  Control your hypertension. ? To reduce your risk of stroke, try to keep your blood pressure below 130/80. ? Eating a healthy diet and exercising regularly are an important part of controlling your blood pressure. If your blood pressure cannot be managed through diet and exercise, you may need to take medicines. ? Take any prescribed medicines to control hypertension as told by your health care provider. ? Ask your health care provider if you should monitor your blood pressure at home. ? Have your blood pressure checked every year, even if your blood pressure is normal. Blood pressure increases with age and some medical conditions.  Get evaluated for sleep disorders (sleep apnea). Talk to your health care provider about getting a sleep evaluation if you snore a lot or have excessive sleepiness.  Take over-the-counter and prescription medicines only as told by your health care provider. Aspirin or blood thinners (antiplatelets or anticoagulants) may be recommended to reduce your risk of forming blood clots that can lead to stroke.  Make sure that any other medical conditions you have, such as atrial fibrillation or atherosclerosis, are managed. What are the warning signs of a stroke? The warning signs of a stroke can be easily remembered as BEFAST.  B is for balance. Signs include: ? Dizziness. ? Loss of balance or coordination. ? Sudden trouble walking.  E is for eyes. Signs include: ? A sudden change in vision. ? Trouble seeing.  F is for face. Signs include: ? Sudden weakness or numbness of the face. ? The face or eyelid drooping to one side.  A is for arms. Signs include: ? Sudden weakness or numbness of the arm, usually on one side of the body.  S is for speech. Signs include: ? Trouble speaking (aphasia). ? Trouble understanding.  T is for time. ? These symptoms may represent a serious problem that is an emergency. Do not wait to see if the symptoms will go  away. Get medical help right away. Call your local emergency services (911 in the [place] Do not drive yourself to the hospital.  Other signs of stroke may include: ? A sudden, severe headache with no known cause. ? Nausea or vomiting. ? Seizure. Where to find more information For more information, visit:  American Stroke Association: www.strokeassociation.org  National Stroke Association: www.stroke.org Summary  You can prevent a stroke by eating healthy, exercising, not smoking, limiting alcohol intake, and managing any medical conditions you may have.  Do not use any products that contain nicotine or tobacco, such as cigarettes and e-cigarettes. If you need help quitting, ask your health care provider. It may also be helpful to avoid exposure to secondhand smoke.  Remember BEFAST for warning signs of stroke. Get help right away if you or a loved one has any of these signs. This information is not intended to replace advice given to you by your health care provider. Make sure you discuss any questions you have with your health care provider. Document Revised: 11/20/2017 Document Reviewed: 01/13/2017 Elsevier Patient Education  2020 [employer].

## 2020-03-29 ENCOUNTER — Ambulatory Visit (INDEPENDENT_AMBULATORY_CARE_PROVIDER_SITE_OTHER): Payer: PPO | Admitting: *Deleted

## 2020-03-29 DIAGNOSIS — I639 Cerebral infarction, unspecified: Secondary | ICD-10-CM

## 2020-03-29 LAB — CUP PACEART REMOTE DEVICE CHECK
Date Time Interrogation Session: 20210407025217
Implantable Pulse Generator Implant Date: 20210201

## 2020-03-29 NOTE — Progress Notes (Signed)
[facility] - Clinic Note  03/30/2020     CHIEF COMPLAINT Patient presents for Retina Follow Up   HISTORY OF PRESENT ILLNESS: [name] is a 69 y.o. male who presents to the clinic today for:   HPI    Retina Follow Up    Patient presents with  Diabetic Retinopathy.  In both eyes.  This started months ago.  Severity is moderate.  Duration of 2 weeks.  Since onset it is stable.  I, the attending physician,  performed the HPI with the patient and updated documentation appropriately.          Comments    69 y/o male pt here for 2 wk f/u for operculated hole OD and NPDR OU.  S/p laser retinopexy OD 3.17.21.  Here for PRP OS today.  No change in [place] OU.  Denies pain, flashes, floaters.  No gtts.  BS and A1C unknown.       Last edited by Rennis Chris, MD on 03/30/2020 11:41 AM. (History)    Patient states he had no problems with the laser at last visit, he used the drops as directed  Referring physician: No referring provider defined for this encounter.  HISTORICAL INFORMATION:   Selected notes from the MEDICAL RECORD NUMBER Referred by Dr. Fabian Sharp for concern of hypertensive retinopathy LEE:  Ocular Hx- PMH-   CURRENT MEDICATIONS: Current Outpatient Medications (Ophthalmic Drugs)  Medication Sig  . prednisoLONE acetate (PRED FORTE) 1 % ophthalmic suspension Place 1 drop into the left eye 4 (four) times daily for 7 days.   No current facility-administered medications for this visit. (Ophthalmic Drugs)   Current Outpatient Medications (Other)  Medication Sig  . aspirin EC 325 MG EC tablet Take 1 tablet (325 mg total) by mouth daily.  Marland Kitchen atorvastatin (LIPITOR) 40 MG tablet Take 1 tablet (40 mg total) by mouth daily at 6 PM.  . carvedilol (COREG) 3.125 MG tablet Take 1 tablet (3.125 mg total) by mouth 2 (two) times daily.  . clopidogrel (PLAVIX) 75 MG tablet Take 1 tablet (75 mg total) by mouth daily.  . hydrochlorothiazide (HYDRODIURIL) 25 MG tablet  Take 25 mg by mouth daily.  Marland Kitchen losartan (COZAAR) 50 MG tablet Take 1 tablet (50 mg total) by mouth daily.  . metFORMIN (GLUCOPHAGE) 500 MG tablet Take 1 tablet (500 mg total) by mouth 2 (two) times daily with a meal.   No current facility-administered medications for this visit. (Other)      REVIEW OF SYSTEMS: ROS    Positive for: Neurological, Endocrine, Eyes   Negative for: Constitutional, Gastrointestinal, Skin, Genitourinary, Musculoskeletal, HENT, Cardiovascular, Respiratory, Psychiatric, Allergic/Imm, Heme/Lymph   Last edited by Celine Mans, COA on 03/30/2020  9:19 AM. (History)       ALLERGIES Allergies  Allergen Reactions  . Shellfish Allergy Anaphylaxis    PAST MEDICAL HISTORY Past Medical History:  Diagnosis Date  . Cataract    Mixed form OU  . Diabetic retinopathy (HCC)    NPDR OU  . Hypertensive retinopathy    OU   Past Surgical History:  Procedure Laterality Date  . LOOP RECORDER INSERTION N/A 01/23/2020   Procedure: LOOP RECORDER INSERTION;  Surgeon: Duke Salvia, MD;  Location: [facility] INVASIVE CV LAB;  Service: Cardiovascular;  Laterality: N/A;    FAMILY HISTORY Family History  Problem Relation Age of Onset  . COPD Mother   . Diabetes Mother   . Dementia Father   . Cancer Father   .  Diabetes Father     SOCIAL HISTORY Social History   Tobacco Use  . Smoking status: Never Smoker  . Smokeless tobacco: Never Used  Substance Use Topics  . Alcohol use: Never  . Drug use: Never         OPHTHALMIC EXAM:  Base Eye Exam    Visual Acuity (Snellen - Linear)      Right Left   Dist [place] 20/30 -2 20/30 -2   Dist ph [place] 20/25 -2 20/25 -2       Tonometry (Tonopen, 9:21 AM)      Right Left   Pressure 14 15       Pupils      Dark Light Shape React APD   Right 3 2 Round Brisk None   Left 3 2 Round Brisk None       Visual Fields (Counting fingers)      Left Right    Full Full       Extraocular Movement      Right Left    Full, Ortho  Full, Ortho       Neuro/Psych    Oriented x3: Yes   Mood/Affect: Normal       Dilation    Both eyes: 1.0% Mydriacyl, 2.5% Phenylephrine @ 9:21 AM        Slit Lamp and Fundus Exam    Slit Lamp Exam      Right Left   Lids/Lashes Dermatochalasis - upper lid Dermatochalasis - upper lid   Conjunctiva/Sclera Nasal and temporal Pinguecula, mild Melanosis Nasal and temporal Pinguecula, mild Melanosis   Cornea Mild Arcus, 2+ Punctate epithelial erosions, Debris in tear film Mild Arcus, 3+ Punctate epithelial erosions, Debris in tear film   Anterior Chamber Deep and quiet Deep and quiet   Iris Round and dilated, No NVI Round and dilated, No NVI   Lens 2-3+ Nuclear sclerosis, 2-3+ Cortical cataract 2-3+ Nuclear sclerosis, 2-3+ Cortical cataract   Vitreous Vitreous syneresis Vitreous syneresis       Fundus Exam      Right Left   Disc Pink and Sharp, +cupping, central pallor Pink and Sharp   C/D Ratio 0.55 0.5   Macula Flat, Blunted foveal reflex, scattered MA, CWS inferior macula, No edema Flat, Blunted foveal reflex, scattered Microaneurysms and exudate greatest inferior to fovea, +edema   Vessels Vascular attenuation, Tortuous, severe attenuation temporal periphery  Vascular attenuation, Tortuous, severe attenuation temporal perhiphery   Periphery Attached, operculated hole at 1000 with partial pigment and +cuff of SRF--good laser changes surrounding, scattered MA, focal CWS just outside IT arcades Attached, scattered IRH and CWS, White without pressure temporally, focal pigmented CR scar at 0130 equator          IMAGING AND PROCEDURES  Imaging and Procedures for @TODAY @  OCT, Retina - OU - Both Eyes       Right Eye Quality was good. Central Foveal Thickness: 246. Progression has worsened. Findings include normal foveal contour, no SRF, intraretinal fluid (Mild interval increase in cystic changes temporal macula, mild diffuse retinal thinning--stable).   Left Eye Quality was  good. Central Foveal Thickness: 247. Progression has been stable. Findings include normal foveal contour, intraretinal fluid, intraretinal hyper-reflective material, no SRF, vitreomacular adhesion  (Mild interval increase in cystic changes temporal macula).   Notes *Images captured and stored on drive  Diagnosis / Impression:  OD: NFP, no SRF -- Mild interval increase in cystic changes temporal macula, mild diffuse retinal thinning--stable OS: NFP, no SRF, +  IRF -- Mild interval increase in cystic changes temporal macula, mild diffuse retinal thinning--stable  Clinical management:  See below  Abbreviations: NFP - Normal foveal profile. CME - cystoid macular edema. PED - pigment epithelial detachment. IRF - intraretinal fluid. SRF - subretinal fluid. EZ - ellipsoid zone. ERM - epiretinal membrane. ORA - outer retinal atrophy. ORT - outer retinal tubulation. SRHM - subretinal hyper-reflective material        Panretinal Photocoagulation - OS - Left Eye       LASER PROCEDURE NOTE  Diagnosis:   Moderate Non-Proliferative Diabetic Retinopathy w/ peripheral nonperfusion, LEFT EYE  Procedure:  Pan-retinal photocoagulation using slit lamp laser, LEFT EYE  Anesthesia:  Topical  Surgeon: [name], MD, PhD   Informed consent obtained, operative eye marked, and time out performed prior to initiation of laser.   Lumenis [id number] slit lamp laser Pattern: 3x3 square Power: 270 mW Duration: 30 msec  Spot size: 200 microns  # spots: 8850 spots  Complications: None.  Notes: none  RTC: 4 wks  Patient tolerated the procedure well and received written and verbal post-procedure care information/education.                  ASSESSMENT/PLAN:    ICD-10-CM   1. Retinal hole of right eye  H33.321   2. Retinal edema  H35.81 OCT, Retina - OU - Both Eyes  3. Moderate nonproliferative diabetic retinopathy of both eyes with macular edema associated with type 2 diabetes mellitus  ([place])  Y77.4128 Panretinal Photocoagulation - OS - Left Eye  4. Essential hypertension  I10   5. Hypertensive retinopathy of both eyes  H35.033   6. Combined forms of age-related cataract of both eyes  H25.813   7. Dry eyes  H04.123   8. Right retinal detachment  H33.21     1,2. Operculated retinal hole w/ cuff of SRF / focal RD, right eye.    - operculated hole located at 1000 with partial pigment and +cuff of SRF / focal RD  - s/p retinopexy OD (03.17.21) -- good laser changes surrounding  - completed PF QID OD x7 days  - f/u in 2-3 wks  3. Moderate to severe non-proliferative diabetic retinopathy, OU  - exam shows scattered MA/IRH/CWS OU  - OCT shows mild cystic changes, mild diffuse retinal thinning OD, mild interval improvement in IRF/retinal thickening OS  - FA (03.26.21) shows late leaking MA OU, significant capillary drop-out OU  - DME improving OS, will monitor for now  - recommend PRP OS for peripheral nonperfusion today, 04.09.21  - pt wished to proceed  - RBA of procedure discussed, questions answered  - informed consent obtained and signed  - see procedure note  - start PF qid OS x7 days  - f/u 4 weeks PRP OS  4,5. Hypertensive retinopathy OU  - discussed importance of tight BP control  - monitor  6. Mixed form age related cataract OU  - The symptoms of cataract, surgical options, and treatments and risks were discussed with patient.  - discussed diagnosis and progression  - visually significant  - monitor  7. Dry eyes OU  - recommend artificial tears and lubricating ointment as needed  Ophthalmic Meds Ordered this visit:  Meds ordered this encounter  Medications  . DISCONTD: prednisoLONE acetate (PRED FORTE) 1 % ophthalmic suspension    Sig: Place 1 drop into the left eye 4 (four) times daily for 7 days.    Dispense:  10 mL  Refill:  0  . prednisoLONE acetate (PRED FORTE) 1 % ophthalmic suspension    Sig: Place 1 drop into the left eye 4 (four) times  daily for 7 days.    Dispense:  10 mL    Refill:  0      Return in about 4 weeks (around 04/27/2020) for Dilated Exam, OCT, possible PRP Laser OD.  There are no Patient Instructions on file for this visit.   Explained the diagnoses, plan, and follow up with the patient and they expressed understanding.  Patient expressed understanding of the importance of proper follow up care.   This document serves as a record of services personally performed by Karie Chimera, MD, PhD. It was created on their behalf by Laurian Brim, OA, an ophthalmic assistant. The creation of this record is the provider's dictation and/or activities during the visit.    Electronically signed by: Laurian Brim, OA 04.08.2021 4:10 PM   Karie Chimera, M.D., Ph.D. Diseases & Surgery of the Retina and Vitreous [facility]  I have reviewed the above documentation for accuracy and completeness, and I agree with the above. Karie Chimera, M.D., Ph.D. 03/30/20 4:10 PM    Abbreviations: M myopia (nearsighted); A astigmatism; H hyperopia (farsighted); P presbyopia; Mrx spectacle prescription;  CTL contact lenses; OD right eye; OS left eye; OU both eyes  XT exotropia; ET esotropia; PEK punctate epithelial keratitis; PEE punctate epithelial erosions; DES dry eye syndrome; MGD meibomian gland dysfunction; ATs artificial tears; PFAT's preservative free artificial tears; NSC nuclear sclerotic cataract; PSC posterior subcapsular cataract; ERM epi-retinal membrane; PVD posterior vitreous detachment; RD retinal detachment; DM diabetes mellitus; DR diabetic retinopathy; NPDR non-proliferative diabetic retinopathy; PDR proliferative diabetic retinopathy; CSME clinically significant macular edema; DME diabetic macular edema; dbh dot blot hemorrhages; CWS cotton wool spot; POAG primary open angle glaucoma; C/D cup-to-disc ratio; HVF humphrey visual field; GVF goldmann visual field; OCT optical coherence tomography;  IOP intraocular pressure; BRVO Branch retinal vein occlusion; CRVO central retinal vein occlusion; CRAO central retinal artery occlusion; BRAO branch retinal artery occlusion; RT retinal tear; SB scleral buckle; PPV pars plana vitrectomy; VH Vitreous hemorrhage; PRP panretinal laser photocoagulation; IVK intravitreal kenalog; VMT vitreomacular traction; MH Macular hole;  NVD neovascularization of the disc; NVE neovascularization elsewhere; AREDS age related eye disease study; ARMD age related macular degeneration; POAG primary open angle glaucoma; EBMD epithelial/anterior basement membrane dystrophy; ACIOL anterior chamber intraocular lens; IOL intraocular lens; PCIOL posterior chamber intraocular lens; Phaco/IOL phacoemulsification with intraocular lens placement; PRK photorefractive keratectomy; LASIK laser assisted in situ keratomileusis; HTN hypertension; DM diabetes mellitus; COPD chronic obstructive pulmonary disease

## 2020-03-29 NOTE — Progress Notes (Signed)
ILR Remote 

## 2020-03-30 ENCOUNTER — Encounter (INDEPENDENT_AMBULATORY_CARE_PROVIDER_SITE_OTHER): Payer: Self-pay | Admitting: Ophthalmology

## 2020-03-30 ENCOUNTER — Ambulatory Visit (INDEPENDENT_AMBULATORY_CARE_PROVIDER_SITE_OTHER): Payer: PPO | Admitting: Ophthalmology

## 2020-03-30 ENCOUNTER — Other Ambulatory Visit: Payer: Self-pay

## 2020-03-30 DIAGNOSIS — H04123 Dry eye syndrome of bilateral lacrimal glands: Secondary | ICD-10-CM

## 2020-03-30 DIAGNOSIS — E113313 Type 2 diabetes mellitus with moderate nonproliferative diabetic retinopathy with macular edema, bilateral: Secondary | ICD-10-CM

## 2020-03-30 DIAGNOSIS — H3581 Retinal edema: Secondary | ICD-10-CM

## 2020-03-30 DIAGNOSIS — H35033 Hypertensive retinopathy, bilateral: Secondary | ICD-10-CM | POA: Diagnosis not present

## 2020-03-30 DIAGNOSIS — H3321 Serous retinal detachment, right eye: Secondary | ICD-10-CM

## 2020-03-30 DIAGNOSIS — H33321 Round hole, right eye: Secondary | ICD-10-CM | POA: Diagnosis not present

## 2020-03-30 DIAGNOSIS — H25813 Combined forms of age-related cataract, bilateral: Secondary | ICD-10-CM

## 2020-03-30 DIAGNOSIS — I10 Essential (primary) hypertension: Secondary | ICD-10-CM | POA: Diagnosis not present

## 2020-03-30 MED ORDER — PREDNISOLONE ACETATE 1 % OP SUSP: 1.0000 [drp] | Freq: Four times a day (QID) | OPHTHALMIC | 0 refills | Status: AC

## 2020-03-30 MED ORDER — PREDNISOLONE ACETATE 1 % OP SUSP: 1.0000 [drp] | Freq: Four times a day (QID) | OPHTHALMIC | 0 refills | Status: DC

## 2020-04-06 DIAGNOSIS — Z1159 Encounter for screening for other viral diseases: Secondary | ICD-10-CM | POA: Diagnosis not present

## 2020-04-06 DIAGNOSIS — R011 Cardiac murmur, unspecified: Secondary | ICD-10-CM | POA: Diagnosis not present

## 2020-04-06 DIAGNOSIS — E1151 Type 2 diabetes mellitus with diabetic peripheral angiopathy without gangrene: Secondary | ICD-10-CM | POA: Diagnosis not present

## 2020-04-06 DIAGNOSIS — E669 Obesity, unspecified: Secondary | ICD-10-CM | POA: Diagnosis not present

## 2020-04-06 DIAGNOSIS — I509 Heart failure, unspecified: Secondary | ICD-10-CM | POA: Diagnosis not present

## 2020-04-06 DIAGNOSIS — Z125 Encounter for screening for malignant neoplasm of prostate: Secondary | ICD-10-CM | POA: Diagnosis not present

## 2020-04-06 DIAGNOSIS — Z008 Encounter for other general examination: Secondary | ICD-10-CM | POA: Diagnosis not present

## 2020-04-06 DIAGNOSIS — Z79899 Other long term (current) drug therapy: Secondary | ICD-10-CM | POA: Diagnosis not present

## 2020-04-06 DIAGNOSIS — I110 Hypertensive heart disease with heart failure: Secondary | ICD-10-CM | POA: Diagnosis not present

## 2020-04-06 DIAGNOSIS — E785 Hyperlipidemia, unspecified: Secondary | ICD-10-CM | POA: Diagnosis not present

## 2020-04-06 DIAGNOSIS — I70209 Unspecified atherosclerosis of native arteries of extremities, unspecified extremity: Secondary | ICD-10-CM | POA: Diagnosis not present

## 2020-04-06 DIAGNOSIS — E1169 Type 2 diabetes mellitus with other specified complication: Secondary | ICD-10-CM | POA: Diagnosis not present

## 2020-04-20 NOTE — Progress Notes (Signed)
[facility] - Clinic Note  04/27/2020     CHIEF COMPLAINT Patient presents for Retina Follow Up   HISTORY OF PRESENT ILLNESS: [name] is a 69 y.o. male who presents to the clinic today for:   HPI    Retina Follow Up    Patient presents with  Diabetic Retinopathy.  In both eyes.  Severity is moderate.  Duration of 4 weeks.  Since onset it is stable.  I, the attending physician,  performed the HPI with the patient and updated documentation appropriately.          Comments    No change in vision OU since last visit.  Denies pain or FOLs after laser Tx.       Last edited by Rennis Chris, MD on 04/27/2020 10:28 AM. (History)    Patient is with his daughter today, they state no problems with laser at last visit  Referring physician: No referring provider defined for this encounter.  HISTORICAL INFORMATION:   Selected notes from the MEDICAL RECORD NUMBER Referred by Dr. Fabian Sharp for concern of hypertensive retinopathy LEE:  Ocular Hx- PMH-   CURRENT MEDICATIONS: No current outpatient medications on file. (Ophthalmic Drugs)   No current facility-administered medications for this visit. (Ophthalmic Drugs)   Current Outpatient Medications (Other)  Medication Sig  . aspirin EC 325 MG EC tablet Take 1 tablet (325 mg total) by mouth daily.  Marland Kitchen atorvastatin (LIPITOR) 40 MG tablet Take 1 tablet (40 mg total) by mouth daily at 6 PM.  . carvedilol (COREG) 3.125 MG tablet Take 1 tablet (3.125 mg total) by mouth 2 (two) times daily.  . hydrochlorothiazide (HYDRODIURIL) 25 MG tablet Take 25 mg by mouth daily.  Marland Kitchen losartan (COZAAR) 50 MG tablet Take 1 tablet (50 mg total) by mouth daily.  . metFORMIN (GLUCOPHAGE) 500 MG tablet Take 1 tablet (500 mg total) by mouth 2 (two) times daily with a meal.   No current facility-administered medications for this visit. (Other)      REVIEW OF SYSTEMS: ROS    Positive for: Neurological, Endocrine, Eyes   Negative  for: Constitutional, Gastrointestinal, Skin, Genitourinary, Musculoskeletal, HENT, Cardiovascular, Respiratory, Psychiatric, Allergic/Imm, Heme/Lymph   Last edited by Joni Reining, COA on 04/27/2020  9:10 AM. (History)       ALLERGIES Allergies  Allergen Reactions  . Shellfish Allergy Anaphylaxis    PAST MEDICAL HISTORY Past Medical History:  Diagnosis Date  . Cataract    Mixed form OU  . Diabetic retinopathy (HCC)    NPDR OU  . Hypertensive retinopathy    OU   Past Surgical History:  Procedure Laterality Date  . LOOP RECORDER INSERTION N/A 01/23/2020   Procedure: LOOP RECORDER INSERTION;  Surgeon: Duke Salvia, MD;  Location: [facility] INVASIVE CV LAB;  Service: Cardiovascular;  Laterality: N/A;    FAMILY HISTORY Family History  Problem Relation Age of Onset  . COPD Mother   . Diabetes Mother   . Dementia Father   . Cancer Father   . Diabetes Father     SOCIAL HISTORY Social History   Tobacco Use  . Smoking status: Never Smoker  . Smokeless tobacco: Never Used  Substance Use Topics  . Alcohol use: Never  . Drug use: Never         OPHTHALMIC EXAM:  Base Eye Exam    Visual Acuity (Snellen - Linear)      Right Left   Dist [place] 20/40 -2 20/30 -2  [place] OU 20/30-2       Tonometry (Tonopen, 9:32 AM)      Right Left   Pressure 15 15       Pupils      Dark Light Shape React APD   Right 3 2 Round Brisk None   Left 3 2 Round Brisk None       Visual Fields (Counting fingers)      Left Right    Full Full       Extraocular Movement      Right Left    Full Full       Neuro/Psych    Oriented x3: Yes   Mood/Affect: Normal       Dilation    Both eyes: 1.0% Mydriacyl, 2.5% Phenylephrine @ 9:32 AM        Slit Lamp and Fundus Exam    Slit Lamp Exam      Right Left   Lids/Lashes Dermatochalasis - upper lid Dermatochalasis - upper lid   Conjunctiva/Sclera Nasal and temporal Pinguecula, mild Melanosis Nasal and temporal Pinguecula, mild Melanosis    Cornea Mild Arcus, 2+ Punctate epithelial erosions, Debris in tear film Mild Arcus, 3+ Punctate epithelial erosions, Debris in tear film   Anterior Chamber Deep and quiet Deep and quiet   Iris Round and dilated, No NVI Round and dilated, No NVI   Lens 2-3+ Nuclear sclerosis, 2-3+ Cortical cataract 2-3+ Nuclear sclerosis, 2-3+ Cortical cataract   Vitreous Vitreous syneresis Vitreous syneresis       Fundus Exam      Right Left   Disc Pink and Sharp, +cupping, central pallor Pink and Sharp   C/D Ratio 0.55 0.5   Macula Flat, good foveal reflex, scattered MA, scattered cystic changes, CWS inferior and nasal macula, No edema Flat, Blunted foveal reflex, scattered Microaneurysms and exudate greatest inferior to fovea, +edema, new blot heme SN macula   Vessels Vascular attenuation, Tortuous, severe attenuation temporal periphery  Vascular attenuation, Tortuous, severe attenuation temporal perhiphery   Periphery Attached, operculated hole at 1000 with partial pigment and +cuff of SRF--good laser changes surrounding, scattered MA, focal CWS just outside IT arcades Attached, scattered IRH and CWS, White without pressure temporally, focal pigmented CR scar at 0130 equator, good peripheral 360 PRP        Refraction    Manifest Refraction      Sphere Cylinder Axis Dist VA   Right -1.00 +0.75 180 20/30-2   Left -0.75 +0.50 160 20/25-2  MR OU 20/20-2          IMAGING AND PROCEDURES  Imaging and Procedures for @TODAY @  OCT, Retina - OU - Both Eyes       Right Eye Quality was good. Central Foveal Thickness: 246. Progression has been stable. Findings include normal foveal contour, no SRF, intraretinal fluid (Persistent IRF temporal macula, mild diffuse retinal thinning--stable).   Left Eye Quality was good. Central Foveal Thickness: 271. Progression has worsened. Findings include normal foveal contour, intraretinal fluid, intraretinal hyper-reflective material, no SRF, vitreomacular adhesion   (Mild interval increase in cystic IRF temporal macula).   Notes *Images captured and stored on drive  Diagnosis / Impression:  OD: NFP, no SRF -- Persistent IRF temporal macula, mild diffuse retinal thinning--stable OS: NFP, no SRF, +IRF -- Mild interval increase in cystic IRF temporal macula  Clinical management:  See below  Abbreviations: NFP - Normal foveal profile. CME - cystoid macular edema. PED - pigment epithelial detachment. IRF - intraretinal fluid. SRF -  subretinal fluid. EZ - ellipsoid zone. ERM - epiretinal membrane. ORA - outer retinal atrophy. ORT - outer retinal tubulation. SRHM - subretinal hyper-reflective material        Intravitreal Injection, Pharmacologic Agent - OS - Left Eye       Time Out 04/27/2020. 10:45 AM. Confirmed correct patient, procedure, site, and patient consented.   Anesthesia Topical anesthesia was used. Anesthetic medications included Lidocaine 2%, Proparacaine 0.5%.   Procedure Preparation included 5% betadine to ocular surface, eyelid speculum. A supplied needle was used.   Injection:  1.25 mg Bevacizumab (AVASTIN) SOLN   NDC: 17408-144-81, Lot: [id number] , Expiration date: 07/04/2020   Route: Intravitreal, Site: Left Eye, Waste: 0 mL  Post-op Post injection exam found visual acuity of at least counting fingers. The patient tolerated the procedure well. There were no complications. The patient received written and verbal post procedure care education.                 ASSESSMENT/PLAN:    ICD-10-CM   1. Retinal hole of right eye  H33.321   2. Moderate nonproliferative diabetic retinopathy of both eyes with macular edema associated with type 2 diabetes mellitus (HCC)  E56.3149 Intravitreal Injection, Pharmacologic Agent - OS - Left Eye    Bevacizumab (AVASTIN) SOLN 1.25 mg  3. Retinal edema  H35.81 OCT, Retina - OU - Both Eyes  4. Essential hypertension  I10   5. Hypertensive retinopathy of both eyes  H35.033   6. Combined forms  of age-related cataract of both eyes  H25.813   7. Dry eyes  H04.123     1. Operculated retinal hole w/ cuff of SRF / focal RD, right eye.    - operculated hole located at 1000 with partial pigment and +cuff of SRF / focal RD  - s/p retinopexy OD (03.17.21) -- good laser changes surrounding  - monitor  - f/u 4 weeks  2,3. Moderate to severe non-proliferative diabetic retinopathy, OU  - exam shows scattered MA/IRH/CWS OU  - OCT shows OD persistent IRF temporal macula, mild diffuse retinal thinning--stable OS: Mild interval increase in cystic IRF temporal macula  - FA (03.26.21) shows late leaking MA OU, significant capillary drop-out OU  - s/p PRP OS (04.09.21) -- good laser changes  - completed PF qid OS x7 days  - recommend IVA OS #1 today, 05.07.21 for +DME  - pt wishes to proceed with IVA  - RBA of procedure discussed, questions answered  - informed consent obtained, signed and scanned, 05.07.21  - see procedure note  - f/u 4 weeks -- DFE/OCT, possible injection  4,5. Hypertensive retinopathy OU  - discussed importance of tight BP control  - monitor  6. Mixed form age related cataract OU  - The symptoms of cataract, surgical options, and treatments and risks were discussed with patient.  - discussed diagnosis and progression  - visually significant  - monitor  7. Dry eyes OU  - recommend artificial tears and lubricating ointment as needed  Ophthalmic Meds Ordered this visit:  Meds ordered this encounter  Medications  . Bevacizumab (AVASTIN) SOLN 1.25 mg      Return in about 4 weeks (around 05/25/2020) for f/u NPDR OU, DFE, OCT.  There are no Patient Instructions on file for this visit.   Explained the diagnoses, plan, and follow up with the patient and they expressed understanding.  Patient expressed understanding of the importance of proper follow up care.   This document serves as a record of services  personally performed by Gardiner Sleeper, MD, PhD. It was  created on their behalf by Ernest Mallick, OA, an ophthalmic assistant. The creation of this record is the provider's dictation and/or activities during the visit.    Electronically signed by: Ernest Mallick, OA 04.30.2021 4:45 PM   Gardiner Sleeper, M.D., Ph.D. Diseases & Surgery of the Retina and Vitreous [facility]  I have reviewed the above documentation for accuracy and completeness, and I agree with the above. Gardiner Sleeper, M.D., Ph.D. 04/30/20 4:45 PM   Abbreviations: M myopia (nearsighted); A astigmatism; H hyperopia (farsighted); P presbyopia; Mrx spectacle prescription;  CTL contact lenses; OD right eye; OS left eye; OU both eyes  XT exotropia; ET esotropia; PEK punctate epithelial keratitis; PEE punctate epithelial erosions; DES dry eye syndrome; MGD meibomian gland dysfunction; ATs artificial tears; PFAT's preservative free artificial tears; [place] nuclear sclerotic cataract; PSC posterior subcapsular cataract; ERM epi-retinal membrane; PVD posterior vitreous detachment; RD retinal detachment; DM diabetes mellitus; DR diabetic retinopathy; NPDR non-proliferative diabetic retinopathy; PDR proliferative diabetic retinopathy; CSME clinically significant macular edema; DME diabetic macular edema; dbh dot blot hemorrhages; CWS cotton wool spot; POAG primary open angle glaucoma; C/D cup-to-disc ratio; HVF humphrey visual field; GVF goldmann visual field; OCT optical coherence tomography; IOP intraocular pressure; BRVO Branch retinal vein occlusion; CRVO central retinal vein occlusion; CRAO central retinal artery occlusion; BRAO branch retinal artery occlusion; RT retinal tear; SB scleral buckle; PPV pars plana vitrectomy; VH Vitreous hemorrhage; PRP panretinal laser photocoagulation; IVK intravitreal kenalog; VMT vitreomacular traction; MH Macular hole;  NVD neovascularization of the disc; NVE neovascularization elsewhere; AREDS age related eye disease study; ARMD age related  macular degeneration; POAG primary open angle glaucoma; EBMD epithelial/anterior basement membrane dystrophy; ACIOL anterior chamber intraocular lens; IOL intraocular lens; PCIOL posterior chamber intraocular lens; Phaco/IOL phacoemulsification with intraocular lens placement; [place] photorefractive keratectomy; LASIK laser assisted in situ keratomileusis; HTN hypertension; DM diabetes mellitus; COPD chronic obstructive pulmonary disease

## 2020-04-27 ENCOUNTER — Ambulatory Visit (INDEPENDENT_AMBULATORY_CARE_PROVIDER_SITE_OTHER): Payer: PPO | Admitting: Ophthalmology

## 2020-04-27 DIAGNOSIS — H25813 Combined forms of age-related cataract, bilateral: Secondary | ICD-10-CM

## 2020-04-27 DIAGNOSIS — H33321 Round hole, right eye: Secondary | ICD-10-CM | POA: Diagnosis not present

## 2020-04-27 DIAGNOSIS — H04123 Dry eye syndrome of bilateral lacrimal glands: Secondary | ICD-10-CM | POA: Diagnosis not present

## 2020-04-27 DIAGNOSIS — E113313 Type 2 diabetes mellitus with moderate nonproliferative diabetic retinopathy with macular edema, bilateral: Secondary | ICD-10-CM

## 2020-04-27 DIAGNOSIS — I10 Essential (primary) hypertension: Secondary | ICD-10-CM | POA: Diagnosis not present

## 2020-04-27 DIAGNOSIS — H3581 Retinal edema: Secondary | ICD-10-CM

## 2020-04-27 DIAGNOSIS — H3321 Serous retinal detachment, right eye: Secondary | ICD-10-CM

## 2020-04-27 DIAGNOSIS — H35033 Hypertensive retinopathy, bilateral: Secondary | ICD-10-CM | POA: Diagnosis not present

## 2020-04-28 LAB — CUP PACEART REMOTE DEVICE CHECK
Date Time Interrogation Session: 20210508025617
Implantable Pulse Generator Implant Date: 20210201

## 2020-04-30 ENCOUNTER — Encounter (INDEPENDENT_AMBULATORY_CARE_PROVIDER_SITE_OTHER): Payer: Self-pay | Admitting: Ophthalmology

## 2020-04-30 MED ORDER — BEVACIZUMAB CHEMO INJECTION 1.25MG/0.05ML SYRINGE FOR KALEIDOSCOPE
1.2500 mg | INTRAVITREAL | Status: AC | PRN
  Administered 2020-04-30: 1.25 mg via INTRAVITREAL

## 2020-05-01 ENCOUNTER — Ambulatory Visit (INDEPENDENT_AMBULATORY_CARE_PROVIDER_SITE_OTHER): Payer: PPO | Admitting: *Deleted

## 2020-05-01 DIAGNOSIS — I639 Cerebral infarction, unspecified: Secondary | ICD-10-CM | POA: Diagnosis not present

## 2020-05-03 NOTE — Progress Notes (Signed)
[facility] Summary Report / Loop Recorder 

## 2020-05-15 ENCOUNTER — Telehealth: Payer: Self-pay | Admitting: *Deleted

## 2020-05-15 ENCOUNTER — Other Ambulatory Visit: Payer: Self-pay

## 2020-05-15 ENCOUNTER — Encounter: Payer: PPO | Admitting: Physician Assistant

## 2020-05-15 DIAGNOSIS — I10 Essential (primary) hypertension: Secondary | ICD-10-CM

## 2020-05-15 DIAGNOSIS — I5042 Chronic combined systolic (congestive) and diastolic (congestive) heart failure: Secondary | ICD-10-CM

## 2020-05-15 NOTE — Progress Notes (Signed)
The patient was identified using 2 identifiers.  Date:  05/15/2020   ID:  [name], DOB Jul 07, 1951, MRN [id number]     PCP:  Patient, No Pcp Per  Cardiologist:  [name] Carnes, MD  Electrophysiologist:  None   Evaluation Performed:    Chief Complaint:    Patient Profile: [name] is a 69 y.o. male with:  Hx of cryptogenic CVA  S/p ILR in 0/9381  Combined systolic and diastolic CHF  Echocardiogram 12/2019: EF 45-50, global HK  Hypertension   Diabetes mellitus    Prior CV Studies:  Echocardiogram 01/21/2020 EF 45-50, mod LVH, Gr 1 DD, normal RVSF, trivial MR< trivial TR, mild AI  History of Present Illness:   [name] established with Dr. Harrington Challenger in 02/2020.  He had been admitted in 12/2019 with a cryptogenic CVA.  His EF was noted to be mildly depressed at 45-50% w/ global HK.  He is s/p ILR which is followed by EP.  Medical Rx is planned initially for his CHF.  He will need another echocardiogram in the next few months to reevaluate his EF.  He is seen for follow up.     Past Medical History:  Diagnosis Date  . Acute cerebrovascular accident (CVA) ([place]) 01/20/2020  . Benign essential HTN 01/20/2020  . Cataract    Mixed form OU  . Diabetic retinopathy ([place])    NPDR OU  . Hypertensive retinopathy    OU   Past Surgical History:  Procedure Laterality Date  . LOOP RECORDER INSERTION N/A 01/23/2020   Procedure: LOOP RECORDER INSERTION;  Surgeon: Deboraha Sprang, MD;  Location: [place] CV LAB;  Service: Cardiovascular;  Laterality: N/A;     No outpatient medications have been marked as taking for the 05/15/20 encounter (Appointment) with [name] Dopp T, PA-C.     Allergies:   Shellfish allergy   Social History   Tobacco Use  . Smoking status: Never Smoker  . Smokeless tobacco: Never Used  Substance Use Topics  . Alcohol use: Never  . Drug use: Never     Family Hx: The patient's family history includes COPD in his mother; Cancer in his  father; Dementia in his father; Diabetes in his father and mother.  ROS:   Please see the history of present illness.     All other systems reviewed and are negative.   Prior CV studies:   The following studies were reviewed today:    Labs/Other Tests and Data Reviewed:    EKG:    Recent Labs: 01/21/2020: TSH 1.730 03/15/2020: ALT 10; Hemoglobin 12.3; Platelets 287 03/23/2020: BUN 15; Creatinine, Ser 1.18; Potassium 3.8; Sodium 141   Recent Lipid Panel Lab Results  Component Value Date/Time   CHOL 105 03/15/2020 04:56 PM   TRIG 89 03/15/2020 04:56 PM   HDL 33 (L) 03/15/2020 04:56 PM   CHOLHDL 3.2 03/15/2020 04:56 PM   CHOLHDL 6.1 01/21/2020 02:14 AM   LDLCALC 54 03/15/2020 04:56 PM    Wt Readings from Last 3 Encounters:  03/26/20 200 lb 6.4 oz (90.9 kg)  03/15/20 189 lb 12.8 oz (86.1 kg)  02/20/20 205 lb 12.8 oz (93.4 kg)     Objective:    Vital Signs:  There were no vitals taken for this visit.     ASSESSMENT & PLAN:    1.   COVID-19 Education: The signs and symptoms of COVID-19 were discussed with the patient and how to seek care for testing (follow up with PCP or  arrange E-visit).  The importance of social distancing was discussed today.  Time:   Today, I have spent  minutes with the patient with telehealth technology discussing the above problems.     Medication Adjustments/Labs and Tests Ordered: Current medicines are reviewed at length with the patient today.  Concerns regarding medicines are outlined above.   Tests Ordered: No orders of the defined types were placed in this encounter.   Medication Changes: No orders of the defined types were placed in this encounter.   Follow Up:     [name] Glasgow, PA-C  05/15/2020 7:44 AM    [facility] This encounter was created in error - please disregard.

## 2020-05-15 NOTE — Telephone Encounter (Signed)
Calling [name] in referenced to his 12:15 appointment with Lorin Picket weaver got answering machine I left message to call office.

## 2020-05-23 ENCOUNTER — Encounter: Payer: Self-pay | Admitting: Gastroenterology

## 2020-05-24 NOTE — Progress Notes (Signed)
[facility] Note  05/25/2020     CHIEF COMPLAINT Patient presents for Retina Follow Up   HISTORY OF PRESENT ILLNESS: [name] is a 69 y.o. male who presents to the clinic today for:   HPI    Retina Follow Up    Patient presents with  Diabetic Retinopathy.  In both eyes.  This started 4 weeks ago.  Severity is moderate.  I, the attending physician,  performed the HPI with the patient and updated documentation appropriately.          Comments    Patient here for 4 weeks retina follow up for NPDR OU. Patient states vision doing pretty good so far. No eye pain.        Last edited by [name], MD on 05/27/2020  2:45 AM. (History)    Patient states vision seems good OU  Referring physician: No referring provider defined for this encounter.  HISTORICAL INFORMATION:   Selected notes from the MEDICAL RECORD NUMBER Referred by Dr. Wyatt Portela for concern of hypertensive retinopathy LEE:  Ocular Hx- PMH-   CURRENT MEDICATIONS: No current outpatient medications on file. (Ophthalmic Drugs)   No current facility-administered medications for this visit. (Ophthalmic Drugs)   Current Outpatient Medications (Other)  Medication Sig  . aspirin EC 325 MG EC tablet Take 1 tablet (325 mg total) by mouth daily.  Marland Kitchen atorvastatin (LIPITOR) 40 MG tablet Take 1 tablet (40 mg total) by mouth daily at 6 PM.  . carvedilol (COREG) 3.125 MG tablet Take 1 tablet (3.125 mg total) by mouth 2 (two) times daily.  . hydrochlorothiazide (HYDRODIURIL) 25 MG tablet Take 25 mg by mouth daily.  Marland Kitchen losartan (COZAAR) 50 MG tablet Take 1 tablet (50 mg total) by mouth daily.  . metFORMIN (GLUCOPHAGE) 500 MG tablet Take 1 tablet (500 mg total) by mouth 2 (two) times daily with a meal.   No current facility-administered medications for this visit. (Other)      REVIEW OF SYSTEMS: ROS    Positive for: Neurological, Endocrine, Eyes   Negative for: Constitutional,  Gastrointestinal, Skin, Genitourinary, Musculoskeletal, HENT, Cardiovascular, Respiratory, Psychiatric, Allergic/Imm, Heme/Lymph   Last edited by Theodore Demark, COA on 05/25/2020  9:56 AM. (History)       ALLERGIES Allergies  Allergen Reactions  . Shellfish Allergy Anaphylaxis    PAST MEDICAL HISTORY Past Medical History:  Diagnosis Date  . Acute cerebrovascular accident (CVA) ([place]) 01/20/2020  . Benign essential HTN 01/20/2020  . Cataract    Mixed form OU  . Diabetic retinopathy ([place])    NPDR OU  . Hypertensive retinopathy    OU   Past Surgical History:  Procedure Laterality Date  . LOOP RECORDER INSERTION N/A 01/23/2020   Procedure: LOOP RECORDER INSERTION;  Surgeon: Deboraha Sprang, MD;  Location: [facility];  Service: Cardiovascular;  Laterality: N/A;    FAMILY HISTORY Family History  Problem Relation Age of Onset  . COPD Mother   . Diabetes Mother   . Dementia Father   . Cancer Father   . Diabetes Father     SOCIAL HISTORY Social History   Tobacco Use  . Smoking status: Never Smoker  . Smokeless tobacco: Never Used  Substance Use Topics  . Alcohol use: Never  . Drug use: Never         OPHTHALMIC EXAM:  Base Eye Exam    Visual Acuity (Snellen - Linear)      Right Left  Dist [place] 20/40 20/30 -1   Dist ph [place] 20/30 +1        Tonometry (Tonopen, 9:54 AM)      Right Left   Pressure 16 17       Pupils      Dark Light Shape React APD   Right 3 2 Round Brisk None   Left 3 2 Round Brisk None       Visual Fields (Counting fingers)      Left Right    Full Full       Extraocular Movement      Right Left    Full, Ortho Full, Ortho       Neuro/Psych    Oriented x3: Yes   Mood/Affect: Normal       Dilation    Both eyes: 1.0% Mydriacyl, 2.5% Phenylephrine @ 9:54 AM        Slit Lamp and Fundus Exam    Slit Lamp Exam      Right Left   Lids/Lashes Dermatochalasis - upper lid Dermatochalasis - upper lid   Conjunctiva/Sclera Nasal  and temporal Pinguecula, mild Melanosis Nasal and temporal Pinguecula, mild Melanosis   Cornea Mild Arcus, 2+ Punctate epithelial erosions, Debris in tear film Mild Arcus, 3+ Punctate epithelial erosions, Debris in tear film   Anterior Chamber Deep and quiet Deep and quiet   Iris Round and dilated, No NVI Round and dilated, No NVI   Lens 2-3+ Nuclear sclerosis, 2-3+ Cortical cataract 2-3+ Nuclear sclerosis, 2-3+ Cortical cataract   Vitreous Vitreous syneresis Vitreous syneresis       Fundus Exam      Right Left   Disc sharp rim, mild pallor, + cupping Pink and Sharp   C/D Ratio 0.55 0.5   Macula Flat, good foveal reflex, scattered MA, scattered cystic changes--improved, CWS inferior and nasal macula, No edema Flat, good foveal reflex, scattered Microaneurysms and exudate greatest inferior to fovea--improving, +edema, + blot hemes SN macula   Vessels Vascular attenuation, Tortuous, severe attenuation temporal periphery  Vascular attenuation, Tortuous, severe attenuation temporal perhiphery   Periphery Attached, operculated hole at 1000 with partial pigment and +cuff of SRF--good laser changes surrounding, scattered MA, focal CWS just outside IT arcades Attached, scattered IRH and CWS, White without pressure temporally, focal pigmented CR scar at 0130 equator, good peripheral 360 PRP          IMAGING AND PROCEDURES  Imaging and Procedures for @TODAY @  OCT, Retina - OU - Both Eyes       Right Eye Quality was good. Central Foveal Thickness: 236. Progression has improved. Findings include normal foveal contour, no SRF, intraretinal fluid (Persistent IRF temporal macula, mild diffuse retinal thinning--stable).   Left Eye Quality was good. Central Foveal Thickness: 250. Progression has improved. Findings include normal foveal contour, intraretinal fluid, intraretinal hyper-reflective material, no SRF, vitreomacular adhesion  (Mild interval increase in cystic IRF temporal macula).    Notes *Images captured and stored on drive  Diagnosis / Impression:  OD: NFP, no SRF -- mild interval improvement IRF temporal macula, mild diffuse retinal thinning--stable OS: NFP, no SRF, +IRF -- interval improvement in cystic IRF temporal macula  Clinical management:  See below  Abbreviations: NFP - Normal foveal profile. CME - cystoid macular edema. PED - pigment epithelial detachment. IRF - intraretinal fluid. SRF - subretinal fluid. EZ - ellipsoid zone. ERM - epiretinal membrane. ORA - outer retinal atrophy. ORT - outer retinal tubulation. SRHM - subretinal hyper-reflective material  Intravitreal Injection, Pharmacologic Agent - OS - Left Eye       Time Out 05/25/2020. 11:09 AM. Confirmed correct patient, procedure, site, and patient consented.   Anesthesia Topical anesthesia was used. Anesthetic medications included Lidocaine 2%, Proparacaine 0.5%.   Procedure Preparation included 5% betadine to ocular surface, eyelid speculum. A supplied needle was used.   Injection:  1.25 mg Bevacizumab (AVASTIN) SOLN   NDC: 72094-709-62, Lot: [id number] , Expiration date: 07/25/2020   Route: Intravitreal, Site: Left Eye, Waste: 0 mL  Post-op Post injection exam found visual acuity of at least counting fingers. The patient tolerated the procedure well. There were no complications. The patient received written and verbal post procedure care education. Post injection medications were not given.                 ASSESSMENT/PLAN:    ICD-10-CM   1. Retinal hole of right eye  H33.321   2. Moderate nonproliferative diabetic retinopathy of both eyes with macular edema associated with type 2 diabetes mellitus (HCC)  09/24/2020 Intravitreal Injection, Pharmacologic Agent - OS - Left Eye    Bevacizumab (AVASTIN) SOLN 1.25 mg  3. Retinal edema  H35.81 OCT, Retina - OU - Both Eyes  4. Essential hypertension  I10   5. Hypertensive retinopathy of both eyes  H35.033   6. Combined forms  of age-related cataract of both eyes  H25.813   7. Dry eyes  H04.123     1. Operculated retinal hole w/ cuff of SRF / focal RD, right eye.    - operculated hole located at 1000 with partial pigment and +cuff of SRF / focal RD  - s/p retinopexy OD (03.17.21) -- good laser changes surrounding  - monitor  2,3. Moderate to severe non-proliferative diabetic retinopathy, OU  - exam shows scattered MA/IRH/CWS OU  - BCVA OD stable at 20/30+1, OS 20/30-1, slightly down from 20/25-2  - OCT shows OD: persistent IRF temporal macula, mild diffuse retinal thinning--stable OS: interval improvememt in cystic IRF temporal macula  - FA (03.26.21) shows late leaking MA OU, significant capillary drop-out OU  - s/p PRP OS (04.09.21) -- good laser changes  - s/p IVA OS #1 (05.07.21)  - recommend IVA OS #2 today, 06.04.21 for +DME  - pt wishes to proceed with IVA  - RBA of procedure discussed, questions answered  - informed consent obtained, signed and scanned, 05.07.21  - see procedure note  - f/u 4-5 weeks -- DFE/OCT, possible injection  4,5. Hypertensive retinopathy OU  - discussed importance of tight BP control  - monitor  6. Mixed form age related cataract OU  - The symptoms of cataract, surgical options, and treatments and risks were discussed with patient.  - discussed diagnosis and progression  - visually significant  - monitor  7. Dry eyes OU  - recommend artificial tears and lubricating ointment as needed  Ophthalmic Meds Ordered this visit:  Meds ordered this encounter  Medications  . Bevacizumab (AVASTIN) SOLN 1.25 mg      Return 4-5 weeks, for DFE, OCT.  There are no Patient Instructions on file for this visit.   Explained the diagnoses, plan, and follow up with the patient and they expressed understanding.  Patient expressed understanding of the importance of proper follow up care.   This document serves as a record of services personally performed by 07.07.21, MD,  PhD. It was created on their behalf by Karie Chimera, COMT. The creation of this record is the provider's  dictation and/or activities during the visit.  Electronically signed by: Annalee Genta, COMT 05/27/20 2:50 AM   Karie Chimera, M.D., Ph.D. Diseases & Surgery of the Retina and Vitreous [facility]   I have reviewed the above documentation for accuracy and completeness, and I agree with the above. Karie Chimera, M.D., Ph.D. 05/27/20 2:50 AM    Abbreviations: M myopia (nearsighted); A astigmatism; H hyperopia (farsighted); P presbyopia; Mrx spectacle prescription;  CTL contact lenses; OD right eye; OS left eye; OU both eyes  XT exotropia; ET esotropia; PEK punctate epithelial keratitis; PEE punctate epithelial erosions; DES dry eye syndrome; MGD meibomian gland dysfunction; ATs artificial tears; PFAT's preservative free artificial tears; NSC nuclear sclerotic cataract; PSC posterior subcapsular cataract; ERM epi-retinal membrane; PVD posterior vitreous detachment; RD retinal detachment; DM diabetes mellitus; DR diabetic retinopathy; NPDR non-proliferative diabetic retinopathy; PDR proliferative diabetic retinopathy; CSME clinically significant macular edema; DME diabetic macular edema; dbh dot blot hemorrhages; CWS cotton wool spot; POAG primary open angle glaucoma; C/D cup-to-disc ratio; HVF humphrey visual field; GVF goldmann visual field; OCT optical coherence tomography; IOP intraocular pressure; BRVO Branch retinal vein occlusion; CRVO central retinal vein occlusion; CRAO central retinal artery occlusion; BRAO branch retinal artery occlusion; RT retinal tear; SB scleral buckle; PPV pars plana vitrectomy; VH Vitreous hemorrhage; PRP panretinal laser photocoagulation; IVK intravitreal kenalog; VMT vitreomacular traction; MH Macular hole;  NVD neovascularization of the disc; NVE neovascularization elsewhere; AREDS age related eye disease study; ARMD age related macular  degeneration; POAG primary open angle glaucoma; EBMD epithelial/anterior basement membrane dystrophy; ACIOL anterior chamber intraocular lens; IOL intraocular lens; PCIOL posterior chamber intraocular lens; Phaco/IOL phacoemulsification with intraocular lens placement; PRK photorefractive keratectomy; LASIK laser assisted in situ keratomileusis; HTN hypertension; DM diabetes mellitus; COPD chronic obstructive pulmonary disease

## 2020-05-25 ENCOUNTER — Encounter (INDEPENDENT_AMBULATORY_CARE_PROVIDER_SITE_OTHER): Payer: Self-pay | Admitting: Ophthalmology

## 2020-05-25 ENCOUNTER — Other Ambulatory Visit: Payer: Self-pay

## 2020-05-25 ENCOUNTER — Ambulatory Visit (INDEPENDENT_AMBULATORY_CARE_PROVIDER_SITE_OTHER): Payer: PPO | Admitting: Ophthalmology

## 2020-05-25 DIAGNOSIS — I10 Essential (primary) hypertension: Secondary | ICD-10-CM | POA: Diagnosis not present

## 2020-05-25 DIAGNOSIS — H04123 Dry eye syndrome of bilateral lacrimal glands: Secondary | ICD-10-CM

## 2020-05-25 DIAGNOSIS — E113313 Type 2 diabetes mellitus with moderate nonproliferative diabetic retinopathy with macular edema, bilateral: Secondary | ICD-10-CM | POA: Diagnosis not present

## 2020-05-25 DIAGNOSIS — H33321 Round hole, right eye: Secondary | ICD-10-CM | POA: Diagnosis not present

## 2020-05-25 DIAGNOSIS — H35033 Hypertensive retinopathy, bilateral: Secondary | ICD-10-CM

## 2020-05-25 DIAGNOSIS — H25813 Combined forms of age-related cataract, bilateral: Secondary | ICD-10-CM | POA: Diagnosis not present

## 2020-05-25 DIAGNOSIS — H3581 Retinal edema: Secondary | ICD-10-CM | POA: Diagnosis not present

## 2020-05-27 ENCOUNTER — Encounter (INDEPENDENT_AMBULATORY_CARE_PROVIDER_SITE_OTHER): Payer: Self-pay | Admitting: Ophthalmology

## 2020-05-27 MED ORDER — BEVACIZUMAB CHEMO INJECTION 1.25MG/0.05ML SYRINGE FOR KALEIDOSCOPE
1.2500 mg | INTRAVITREAL | Status: AC | PRN
  Administered 2020-05-27: 1.25 mg via INTRAVITREAL

## 2020-05-29 DIAGNOSIS — R634 Abnormal weight loss: Secondary | ICD-10-CM | POA: Diagnosis not present

## 2020-05-29 DIAGNOSIS — E663 Overweight: Secondary | ICD-10-CM | POA: Diagnosis not present

## 2020-05-29 DIAGNOSIS — Z0000 Encounter for general adult medical examination without abnormal findings: Secondary | ICD-10-CM | POA: Diagnosis not present

## 2020-05-29 DIAGNOSIS — Z008 Encounter for other general examination: Secondary | ICD-10-CM | POA: Diagnosis not present

## 2020-05-29 DIAGNOSIS — Z8673 Personal history of transient ischemic attack (TIA), and cerebral infarction without residual deficits: Secondary | ICD-10-CM | POA: Diagnosis not present

## 2020-05-29 DIAGNOSIS — R011 Cardiac murmur, unspecified: Secondary | ICD-10-CM | POA: Diagnosis not present

## 2020-05-29 DIAGNOSIS — Z6828 Body mass index (BMI) 28.0-28.9, adult: Secondary | ICD-10-CM | POA: Diagnosis not present

## 2020-05-29 DIAGNOSIS — I5032 Chronic diastolic (congestive) heart failure: Secondary | ICD-10-CM | POA: Diagnosis not present

## 2020-05-29 DIAGNOSIS — I110 Hypertensive heart disease with heart failure: Secondary | ICD-10-CM | POA: Diagnosis not present

## 2020-05-30 DIAGNOSIS — H40023 Open angle with borderline findings, high risk, bilateral: Secondary | ICD-10-CM | POA: Diagnosis not present

## 2020-05-30 DIAGNOSIS — E11319 Type 2 diabetes mellitus with unspecified diabetic retinopathy without macular edema: Secondary | ICD-10-CM | POA: Diagnosis not present

## 2020-05-30 DIAGNOSIS — H35033 Hypertensive retinopathy, bilateral: Secondary | ICD-10-CM | POA: Diagnosis not present

## 2020-05-30 DIAGNOSIS — H53462 Homonymous bilateral field defects, left side: Secondary | ICD-10-CM | POA: Diagnosis not present

## 2020-05-30 DIAGNOSIS — H25813 Combined forms of age-related cataract, bilateral: Secondary | ICD-10-CM | POA: Diagnosis not present

## 2020-06-04 ENCOUNTER — Ambulatory Visit (INDEPENDENT_AMBULATORY_CARE_PROVIDER_SITE_OTHER): Payer: PPO | Admitting: *Deleted

## 2020-06-04 DIAGNOSIS — I639 Cerebral infarction, unspecified: Secondary | ICD-10-CM

## 2020-06-04 LAB — CUP PACEART REMOTE DEVICE CHECK
Date Time Interrogation Session: 20210613230538
Implantable Pulse Generator Implant Date: 20210201

## 2020-06-06 ENCOUNTER — Telehealth: Payer: Self-pay | Admitting: *Deleted

## 2020-06-06 NOTE — Telephone Encounter (Signed)
Form in Dr. Charlott Rakes mailbox from [facility] with a cardiovascular section that Dr. Tenny Craw filled out today.  Message left on patient's VM that form is complete and to let me know what to do with it next.

## 2020-06-06 NOTE — Progress Notes (Signed)
[facility] Summary Report / Loop Recorder 

## 2020-06-11 NOTE — Telephone Encounter (Signed)
I called back and spoke with the patient's daughter.  She will come by tomorrow to the office to pick up the [facility] form.  I will leave it at the front desk.  She is aware that if our staff is downstairs that the form will be down their with them.

## 2020-06-11 NOTE — Telephone Encounter (Signed)
Patient's daughter is calling to follow up in regards to status of form completed by Dr. Tenny Craw. She states she would like a call.

## 2020-06-22 NOTE — Progress Notes (Signed)
[facility] - Clinic Note  06/26/2020     CHIEF COMPLAINT Patient presents for Retina Follow Up   HISTORY OF PRESENT ILLNESS: [name] is a 69 y.o. male who presents to the clinic today for:   HPI    Retina Follow Up    Patient presents with  Retinal Break/Detachment.  In right eye.  Severity is moderate.  Duration of 4.5 weeks.  Since onset it is stable.  I, the attending physician,  performed the HPI with the patient and updated documentation appropriately.          Comments    Patient states vision the same OU. Last a1c was 6.8. No new floaters or flashes.       Last edited by Rennis Chris, MD on 06/26/2020  2:56 PM. (History)    Patient states no changes in vision that he is aware of  Referring physician: No referring provider defined for this encounter.  HISTORICAL INFORMATION:   Selected notes from the MEDICAL RECORD NUMBER Referred by Dr. Fabian Sharp for concern of hypertensive retinopathy LEE:  Ocular Hx- PMH-   CURRENT MEDICATIONS: No current outpatient medications on file. (Ophthalmic Drugs)   No current facility-administered medications for this visit. (Ophthalmic Drugs)   Current Outpatient Medications (Other)  Medication Sig   aspirin EC 325 MG EC tablet Take 1 tablet (325 mg total) by mouth daily.   atorvastatin (LIPITOR) 40 MG tablet Take 1 tablet (40 mg total) by mouth daily at 6 PM.   hydrochlorothiazide (HYDRODIURIL) 25 MG tablet Take 25 mg by mouth daily.   losartan (COZAAR) 50 MG tablet Take 1 tablet (50 mg total) by mouth daily.   carvedilol (COREG) 3.125 MG tablet Take 1 tablet (3.125 mg total) by mouth 2 (two) times daily.   metFORMIN (GLUCOPHAGE) 500 MG tablet Take 1 tablet (500 mg total) by mouth 2 (two) times daily with a meal.   No current facility-administered medications for this visit. (Other)      REVIEW OF SYSTEMS: ROS    Positive for: Neurological, Endocrine, Eyes   Negative for:  Constitutional, Gastrointestinal, Skin, Genitourinary, Musculoskeletal, HENT, Cardiovascular, Respiratory, Psychiatric, Allergic/Imm, Heme/Lymph   Last edited by Annalee Genta D, COT on 06/26/2020  2:16 PM. (History)       ALLERGIES Allergies  Allergen Reactions   Shellfish Allergy Anaphylaxis    PAST MEDICAL HISTORY Past Medical History:  Diagnosis Date   Acute cerebrovascular accident (CVA) (HCC) 01/20/2020   Benign essential HTN 01/20/2020   Cataract    Mixed form OU   Diabetic retinopathy (HCC)    NPDR OU   Hypertensive retinopathy    OU   Past Surgical History:  Procedure Laterality Date   LOOP RECORDER INSERTION N/A 01/23/2020   Procedure: LOOP RECORDER INSERTION;  Surgeon: Duke Salvia, MD;  Location: [facility] INVASIVE CV LAB;  Service: Cardiovascular;  Laterality: N/A;    FAMILY HISTORY Family History  Problem Relation Age of Onset   COPD Mother    Diabetes Mother    Dementia Father    Cancer Father    Diabetes Father     SOCIAL HISTORY Social History   Tobacco Use   Smoking status: Never Smoker   Smokeless tobacco: Never Used  Building services engineer Use: Never used  Substance Use Topics   Alcohol use: Never   Drug use: Never         OPHTHALMIC EXAM:  Base Eye Exam    Visual  Acuity (Snellen - Linear)      Right Left   Dist [place] 20/40 -2 20/50 -2   Dist ph [place] 20/30 20/30 -2       Tonometry (Tonopen, 2:25 PM)      Right Left   Pressure 15 15       Pupils      Dark Light Shape React APD   Right 3 2 Round Brisk None   Left 3 2 Round Brisk None       Visual Fields (Counting fingers)      Left Right    Full Full       Extraocular Movement      Right Left    Full, Ortho Full, Ortho       Neuro/Psych    Oriented x3: Yes   Mood/Affect: Normal       Dilation    Both eyes: 1.0% Mydriacyl, 2.5% Phenylephrine @ 2:26 PM        Slit Lamp and Fundus Exam    Slit Lamp Exam      Right Left   Lids/Lashes Dermatochalasis -  upper lid Dermatochalasis - upper lid   Conjunctiva/Sclera Nasal and temporal Pinguecula, mild Melanosis Nasal and temporal Pinguecula, mild Melanosis   Cornea Mild Arcus, 2+ Punctate epithelial erosions, Debris in tear film Mild Arcus, 3+ Punctate epithelial erosions, Debris in tear film   Anterior Chamber Deep and quiet Deep and quiet   Iris Round and dilated, No NVI Round and dilated, No NVI   Lens 2-3+ Nuclear sclerosis, 2-3+ Cortical cataract 2-3+ Nuclear sclerosis, 2-3+ Cortical cataract   Vitreous Vitreous syneresis Vitreous syneresis       Fundus Exam      Right Left   Disc sharp rim, mild pallor, + cupping Pink and Sharp   C/D Ratio 0.5 0.5   Macula Flat, good foveal reflex, scattered MA, scattered cystic changes, CWS inferior and nasal macula -- improving, No edema Flat, good foveal reflex, scattered Microaneurysms and exudate greatest inferior to fovea--improving, +edema, +blot hemes SN macula -- improving   Vessels Vascular attenuation, Tortuous, severe attenuation temporal periphery  Vascular attenuation, Tortuous, severe attenuation temporal perhiphery   Periphery Attached, operculated hole at 1000 with partial pigment and +cuff of SRF--good laser changes surrounding, scattered MA, focal CWS just outside IT arcades Attached, scattered IRH and CWS, White without pressure temporally, focal pigmented CR scar at 0130 equator, good peripheral 360 PRP          IMAGING AND PROCEDURES  Imaging and Procedures for @  OCT, Retina - OU - Both Eyes       Right Eye Quality was good. Central Foveal Thickness: 239. Progression has been stable. Findings include normal foveal contour, no SRF, intraretinal fluid (Persistent IRF temporal macula, mild diffuse retinal thinning--stable).   Left Eye Quality was good. Central Foveal Thickness: 258. Progression has been stable. Findings include normal foveal contour, intraretinal fluid, intraretinal hyper-reflective material, no SRF,  vitreomacular adhesion  (Mild interval increase in cystic IRF temporal macula).   Notes *Images captured and stored on drive  Diagnosis / Impression:  OD: NFP, no SRF -- mild interval improvement IRF temporal macula, mild diffuse retinal thinning--stable OS: NFP, no SRF, +IRF -- persistent cystic IRF temporal macula  Clinical management:  See below  Abbreviations: NFP - Normal foveal profile. CME - cystoid macular edema. PED - pigment epithelial detachment. IRF - intraretinal fluid. SRF - subretinal fluid. EZ - ellipsoid zone. ERM - epiretinal membrane. ORA -  outer retinal atrophy. ORT - outer retinal tubulation. SRHM - subretinal hyper-reflective material        Intravitreal Injection, Pharmacologic Agent - OS - Left Eye       Time Out 06/26/2020. 2:41 PM. Confirmed correct patient, procedure, site, and patient consented.   Anesthesia Topical anesthesia was used. Anesthetic medications included Lidocaine 2%, Proparacaine 0.5%.   Procedure Preparation included 5% betadine to ocular surface, eyelid speculum. A supplied needle was used.   Injection:  1.25 mg Bevacizumab (AVASTIN) SOLN   NDC: 75643-329-51, Lot: [id number] , Expiration date: 08/15/2020   Route: Intravitreal, Site: Left Eye, Waste: 0 mg  Post-op Post injection exam found visual acuity of at least counting fingers. The patient tolerated the procedure well. There were no complications. The patient received written and verbal post procedure care education.                 ASSESSMENT/PLAN:    ICD-10-CM   1. Retinal hole of right eye  H33.321   2. Moderate nonproliferative diabetic retinopathy of both eyes with macular edema associated with type 2 diabetes mellitus (HCC)  08/17/2020 Intravitreal Injection, Pharmacologic Agent - OS - Left Eye    Bevacizumab (AVASTIN) SOLN 1.25 mg  3. Retinal edema  H35.81 OCT, Retina - OU - Both Eyes  4. Essential hypertension  I10   5. Hypertensive retinopathy of both eyes   H35.033   6. Combined forms of age-related cataract of both eyes  H25.813   7. Dry eyes  H04.123     1. Operculated retinal hole w/ cuff of SRF / focal RD, right eye.    - operculated hole located at 1000 with partial pigment and +cuff of SRF / focal RD  - s/p retinopexy OD (03.17.21) -- good laser changes surrounding  - monitor  2,3. Moderate to severe non-proliferative diabetic retinopathy, OU  - s/p IVA OS #1 (05.07.21), #2 (06.04.21)  - s/p PRP OS (04.09.21) -- good laser changes  - FA (03.26.21) shows late leaking MA OU, significant capillary drop-out   - exam shows scattered MA/IRH/CWS OU  - BCVA OD stable at 20/30+1, OS 20/30  - OCT shows OD: persistent IRF temporal macula, mild diffuse retinal thinning--stable   OS: persistent cystic IRF temporal macula  - recommend IVA OS #3 today, 07.06.21 for +DME  - pt wishes to proceed with IVA  - RBA of procedure discussed, questions answered  - informed consent obtained, signed and scanned, 05.07.21  - see procedure note  - f/u 4-5 weeks -- DFE/OCT, possible injection  4,5. Hypertensive retinopathy OU  - discussed importance of tight BP control  - monitor  6. Mixed form age related cataract OU  - The symptoms of cataract, surgical options, and treatments and risks were discussed with patient.  - discussed diagnosis and progression  - visually significant  - monitor  7. Dry eyes OU  - recommend artificial tears and lubricating ointment as needed  Ophthalmic Meds Ordered this visit:  Meds ordered this encounter  Medications   Bevacizumab (AVASTIN) SOLN 1.25 mg      Return in about 4 weeks (around 07/24/2020) for f/u NPDR OU, DFE, OCT.  There are no Patient Instructions on file for this visit.   Explained the diagnoses, plan, and follow up with the patient and they expressed understanding.  Patient expressed understanding of the importance of proper follow up care.   This document serves as a record of services  personally performed by 09/23/2020, MD,  PhD. It was created on their behalf by Annalee Genta, COMT. The creation of this record is the provider's dictation and/or activities during the visit.  Electronically signed by: Annalee Genta, COMT 06/26/20 10:40 PM   This document serves as a record of services personally performed by Karie Chimera, MD, PhD. It was created on their behalf by Glee Arvin. Manson Passey, COT, an ophthalmic technician. The creation of this record is the provider's dictation and/or activities during the visit.    Electronically signed by: Glee Arvin. Manson Passey, [place] 07.06.2021 10:40 PM   Karie Chimera, M.D., Ph.D. Diseases & Surgery of the Retina and Vitreous [facility]  I have reviewed the above documentation for accuracy and completeness, and I agree with the above. Karie Chimera, M.D., Ph.D. 06/26/20 10:40 PM   Abbreviations: M myopia (nearsighted); A astigmatism; H hyperopia (farsighted); P presbyopia; Mrx spectacle prescription;  CTL contact lenses; OD right eye; OS left eye; OU both eyes  XT exotropia; ET esotropia; PEK punctate epithelial keratitis; PEE punctate epithelial erosions; DES dry eye syndrome; MGD meibomian gland dysfunction; ATs artificial tears; PFAT's preservative free artificial tears; NSC nuclear sclerotic cataract; PSC posterior subcapsular cataract; ERM epi-retinal membrane; PVD posterior vitreous detachment; RD retinal detachment; DM diabetes mellitus; DR diabetic retinopathy; NPDR non-proliferative diabetic retinopathy; PDR proliferative diabetic retinopathy; CSME clinically significant macular edema; DME diabetic macular edema; dbh dot blot hemorrhages; CWS cotton wool spot; POAG primary open angle glaucoma; C/D cup-to-disc ratio; HVF humphrey visual field; GVF goldmann visual field; OCT optical coherence tomography; IOP intraocular pressure; BRVO Branch retinal vein occlusion; CRVO central retinal vein occlusion; CRAO central retinal  artery occlusion; BRAO branch retinal artery occlusion; RT retinal tear; SB scleral buckle; PPV pars plana vitrectomy; VH Vitreous hemorrhage; PRP panretinal laser photocoagulation; IVK intravitreal kenalog; VMT vitreomacular traction; MH Macular hole;  NVD neovascularization of the disc; NVE neovascularization elsewhere; AREDS age related eye disease study; ARMD age related macular degeneration; POAG primary open angle glaucoma; EBMD epithelial/anterior basement membrane dystrophy; ACIOL anterior chamber intraocular lens; IOL intraocular lens; PCIOL posterior chamber intraocular lens; Phaco/IOL phacoemulsification with intraocular lens placement; PRK photorefractive keratectomy; LASIK laser assisted in situ keratomileusis; HTN hypertension; DM diabetes mellitus; COPD chronic obstructive pulmonary disease

## 2020-06-26 ENCOUNTER — Other Ambulatory Visit: Payer: Self-pay

## 2020-06-26 ENCOUNTER — Encounter (INDEPENDENT_AMBULATORY_CARE_PROVIDER_SITE_OTHER): Payer: Self-pay | Admitting: Ophthalmology

## 2020-06-26 ENCOUNTER — Ambulatory Visit (INDEPENDENT_AMBULATORY_CARE_PROVIDER_SITE_OTHER): Payer: PPO | Admitting: Ophthalmology

## 2020-06-26 DIAGNOSIS — H25813 Combined forms of age-related cataract, bilateral: Secondary | ICD-10-CM

## 2020-06-26 DIAGNOSIS — H04123 Dry eye syndrome of bilateral lacrimal glands: Secondary | ICD-10-CM | POA: Diagnosis not present

## 2020-06-26 DIAGNOSIS — H3581 Retinal edema: Secondary | ICD-10-CM | POA: Diagnosis not present

## 2020-06-26 DIAGNOSIS — H35033 Hypertensive retinopathy, bilateral: Secondary | ICD-10-CM | POA: Diagnosis not present

## 2020-06-26 DIAGNOSIS — H33321 Round hole, right eye: Secondary | ICD-10-CM | POA: Diagnosis not present

## 2020-06-26 DIAGNOSIS — E113313 Type 2 diabetes mellitus with moderate nonproliferative diabetic retinopathy with macular edema, bilateral: Secondary | ICD-10-CM

## 2020-06-26 DIAGNOSIS — I10 Essential (primary) hypertension: Secondary | ICD-10-CM | POA: Diagnosis not present

## 2020-06-26 MED ORDER — BEVACIZUMAB CHEMO INJECTION 1.25MG/0.05ML SYRINGE FOR KALEIDOSCOPE
1.2500 mg | INTRAVITREAL | Status: AC | PRN
  Administered 2020-06-26: 1.25 mg via INTRAVITREAL

## 2020-06-27 DIAGNOSIS — Z79899 Other long term (current) drug therapy: Secondary | ICD-10-CM | POA: Diagnosis not present

## 2020-06-28 ENCOUNTER — Ambulatory Visit (AMBULATORY_SURGERY_CENTER): Payer: Self-pay | Admitting: *Deleted

## 2020-06-28 ENCOUNTER — Other Ambulatory Visit: Payer: Self-pay

## 2020-06-28 VITALS — Ht 67.0 in | Wt 178.6 lb

## 2020-06-28 DIAGNOSIS — Z01818 Encounter for other preprocedural examination: Secondary | ICD-10-CM

## 2020-06-28 DIAGNOSIS — Z1211 Encounter for screening for malignant neoplasm of colon: Secondary | ICD-10-CM

## 2020-06-28 MED ORDER — SUTAB 1479-225-188 MG PO TABS: 1.0000 | ORAL_TABLET | Freq: Once | ORAL | 0 refills | Status: AC

## 2020-06-28 NOTE — Progress Notes (Signed)
[name] here at [facility] with pt  covid test 07-05-20 at 3:20 pm  Pt had CVA 01-20-20- greater than 3 months  Pt has many questions at [facility]- visit took an hour.  I attempted to answer all the pt's questions about the procedure- he states understanding at the end of his pv  Pt is aware that care partner will wait in the car during procedure; if they feel like they will be too hot or cold to wait in the car; they may wait in the 4 th floor lobby. Patient is aware to bring only one care partner. We want them to wear a mask (we do not have any that we can provide them), practice social distancing, and we will check their temperatures when they get here.  I did remind the patient that their care partner needs to stay in the parking lot the entire time and have a cell phone available, we will call them when the pt is ready for discharge. Patient will wear mask into building.   No egg or soy allergy  No home oxygen use   No medications for weight loss taken  emmi information given  Pt denies constipation issues  No trouble moving neck, no family hx of malignant hyperthermia   Sutab code put into RX and paper copy given to pt to show pharmacy

## 2020-06-29 ENCOUNTER — Encounter: Payer: Self-pay | Admitting: Gastroenterology

## 2020-07-05 ENCOUNTER — Ambulatory Visit (INDEPENDENT_AMBULATORY_CARE_PROVIDER_SITE_OTHER): Payer: PPO

## 2020-07-05 ENCOUNTER — Other Ambulatory Visit: Payer: Self-pay | Admitting: Gastroenterology

## 2020-07-05 DIAGNOSIS — Z1159 Encounter for screening for other viral diseases: Secondary | ICD-10-CM | POA: Diagnosis not present

## 2020-07-06 LAB — SARS CORONAVIRUS 2 (TAT 6-24 HRS): SARS Coronavirus 2: NEGATIVE

## 2020-07-09 ENCOUNTER — Ambulatory Visit (INDEPENDENT_AMBULATORY_CARE_PROVIDER_SITE_OTHER): Payer: PPO | Admitting: *Deleted

## 2020-07-09 DIAGNOSIS — I639 Cerebral infarction, unspecified: Secondary | ICD-10-CM

## 2020-07-09 LAB — CUP PACEART REMOTE DEVICE CHECK
Date Time Interrogation Session: 20210718230433
Implantable Pulse Generator Implant Date: 20210201

## 2020-07-10 ENCOUNTER — Ambulatory Visit (AMBULATORY_SURGERY_CENTER): Payer: PPO | Admitting: Gastroenterology

## 2020-07-10 ENCOUNTER — Other Ambulatory Visit: Payer: Self-pay

## 2020-07-10 ENCOUNTER — Encounter: Payer: Self-pay | Admitting: Gastroenterology

## 2020-07-10 VITALS — BP 125/66 | HR 54 | Temp 96.7°F | Resp 11 | Ht 67.0 in | Wt 178.0 lb

## 2020-07-10 DIAGNOSIS — Z1211 Encounter for screening for malignant neoplasm of colon: Secondary | ICD-10-CM

## 2020-07-10 MED ORDER — SODIUM CHLORIDE 0.9 % IV SOLN: 500.0000 mL | Freq: Once | INTRAVENOUS | Status: DC

## 2020-07-10 NOTE — Progress Notes (Signed)
Vitals-cw 

## 2020-07-10 NOTE — Op Note (Signed)
[facility] Patient Name: [name] Procedure Date: 07/10/2020 11:33 AM MRN: [id number] Endoscopist: Viviann Spare P. Adela Lank , MD Age: 69 Referring MD:  Date of Birth: 1951/06/10 Gender: Male Account #: [id number] Procedure:                Colonoscopy Indications:              Screening for colorectal malignant neoplasm, This                            is the patient's first colonoscopy Medicines:                Monitored Anesthesia Care Procedure:                Pre-Anesthesia Assessment:                           - Prior to the procedure, a History and Physical                            was performed, and patient medications and                            allergies were reviewed. The patient's tolerance of                            previous anesthesia was also reviewed. The risks                            and benefits of the procedure and the sedation                            options and risks were discussed with the patient.                            All questions were answered, and informed consent                            was obtained. Prior Anticoagulants: The patient has                            taken no previous anticoagulant or antiplatelet                            agents. ASA Grade Assessment: III - A patient with                            severe systemic disease. After reviewing the risks                            and benefits, the patient was deemed in                            satisfactory condition to undergo the procedure.  After obtaining informed consent, the colonoscope                            was passed under direct vision. Throughout the                            procedure, the patient's blood pressure, pulse, and                            oxygen saturations were monitored continuously. The                            Colonoscope was introduced through the anus and                            advanced to the  the cecum, identified by                            appendiceal orifice and ileocecal valve. The                            colonoscopy was technically difficult and complex                            due to inadequate bowel prep. The patient tolerated                            the procedure well. The quality of the bowel                            preparation was inadequate. The ileocecal valve,                            appendiceal orifice, and rectum were photographed. Scope In: 11:42:54 AM Scope Out: 11:57:42 AM Scope Withdrawal Time: 0 hours 11 minutes 7 seconds  Total Procedure Duration: 0 hours 14 minutes 48 seconds  Findings:                 The perianal and digital rectal examinations were                            normal.                           A moderate amount of residual seeds / nuts was                            found in the entire colon, making visualization                            difficult, particularly in the dependant portions                            of the colon which were affected the most. Lavage  of the area was performed using a large amount of                            sterile water, resulting in incomplete clearance                            with fair visualization, the debris clogged the                            colonoscope on multiple attempts. These areas were                            lavaged and nothing obviously noted but prep                            inadequate for small or flat lesions.                           Internal hemorrhoids were found during                            retroflexion. The hemorrhoids were small.                           The exam was otherwise without abnormality of what                            was visualized. Complications:            No immediate complications. Estimated blood loss:                            None. Estimated Blood Loss:     Estimated blood loss: none. Impression:                - Preparation of the colon was inadequate as                            outlined above.                           - Internal hemorrhoids.                           - The examination was otherwise normal of what was                            visualized following lavage. Recommendation:           - Patient has a contact number available for                            emergencies. The signs and symptoms of potential                            delayed complications were discussed with the  patient. Return to normal activities tomorrow.                            Written discharge instructions were provided to the                            patient.                           - Resume previous diet.                           - Continue present medications.                           - Repeat colonoscopy because the bowel preparation                            was suboptimal, will discuss timing with patient,                            within 1 year or when he is agreeable to do it.                            Double prep recommended Viviann Spare P. Jelina Paulsen, MD 07/10/2020 12:10:40 PM This report has been signed electronically.

## 2020-07-10 NOTE — Patient Instructions (Signed)
YOU HAD AN ENDOSCOPIC PROCEDURE TODAY AT THE [facility]:   Refer to the procedure report that was given to you for any specific questions about what was found during the examination.  If the procedure report does not answer your questions, please call your gastroenterologist to clarify.  If you requested that your care partner not be given the details of your procedure findings, then the procedure report has been included in a sealed envelope for you to review at your convenience later.  YOU SHOULD EXPECT: Some feelings of bloating in the abdomen. Passage of more gas than usual.  Walking can help get rid of the air that was put into your GI tract during the procedure and reduce the bloating. If you had a lower endoscopy (such as a colonoscopy or flexible sigmoidoscopy) you may notice spotting of blood in your stool or on the toilet paper. If you underwent a bowel prep for your procedure, you may not have a normal bowel movement for a few days.  Please Note:  You might notice some irritation and congestion in your nose or some drainage.  This is from the oxygen used during your procedure.  There is no need for concern and it should clear up in a day or so.  SYMPTOMS TO REPORT IMMEDIATELY:   Following lower endoscopy (colonoscopy or flexible sigmoidoscopy):  Excessive amounts of blood in the stool  Significant tenderness or worsening of abdominal pains  Swelling of the abdomen that is new, acute  Fever of 100F or higher   For urgent or emergent issues, a gastroenterologist can be reached at any hour by calling [phone]. Do not use MyChart messaging for urgent concerns.    DIET:  We do recommend a small meal at first, but then you may proceed to your regular diet.  Drink plenty of fluids but you should avoid alcoholic beverages for 24 hours.  MEDICATIONS: Continue present medications.  Please see handouts given to you by your recovery nurse.  FOLLOW UP: Repeat colonoscopy  in one year if you are agreeable to it because the prep was sub-optimal. A double-prep is recommended for the next colonoscopy.  ACTIVITY:  You should plan to take it easy for the rest of today and you should NOT DRIVE or use heavy machinery until tomorrow (because of the sedation medicines used during the test).    FOLLOW UP: Our staff will call the number listed on your records 48-72 hours following your procedure to check on you and address any questions or concerns that you may have regarding the information given to you following your procedure. If we do not reach you, we will leave a message.  We will attempt to reach you two times.  During this call, we will ask if you have developed any symptoms of COVID 19. If you develop any symptoms (ie: fever, flu-like symptoms, shortness of breath, cough etc.) before then, please call [phone].  If you test positive for Covid 19 in the 2 weeks post procedure, please call and report this information to [place].    If any biopsies were taken you will be contacted by phone or by letter within the next 1-3 weeks.  Please call us at [phone] if you have not heard about the biopsies in 3 weeks.   Thank you for allowing [place] to provide for your healthcare needs today.   SIGNATURES/CONFIDENTIALITY: You and/or your care partner have signed paperwork which will be entered into your electronic medical record.  These signatures attest to the fact that that the information above on your After Visit Summary has been reviewed and is understood.  Full responsibility of the confidentiality of this discharge information lies with you and/or your care-partner.

## 2020-07-10 NOTE — Progress Notes (Signed)
pt tolerated well. VSS. awake and to recovery. Report given to RN.  

## 2020-07-11 NOTE — Progress Notes (Signed)
[facility] Summary Report / Loop Recorder 

## 2020-07-12 ENCOUNTER — Telehealth: Payer: Self-pay

## 2020-07-12 NOTE — Telephone Encounter (Signed)
  Follow up Call-  Call back number 07/10/2020  Post procedure Call Back phone  # [phone]  Permission to leave phone message Yes  Some recent data might be hidden     Patient questions:  Do you have a fever, pain , or abdominal swelling? No. Pain Score  0 *  Have you tolerated food without any problems? Yes.    Have you been able to return to your normal activities? Yes.    Do you have any questions about your discharge instructions: Diet   No. Medications  No. Follow up visit  No.  Do you have questions or concerns about your Care? No.  Actions: * If pain score is 4 or above: No action needed, pain <4.  1. Have you developed a fever since your procedure? no  2.   Have you had an respiratory symptoms (SOB or cough) since your procedure? no  3.   Have you tested positive for COVID 19 since your procedure no  4.   Have you had any family members/close contacts diagnosed with the COVID 19 since your procedure?  no   If yes to any of these questions please route to Laverna Peace, RN and Charlett Lango, RN

## 2020-07-23 NOTE — Progress Notes (Signed)
[facility] Note  07/24/2020     CHIEF COMPLAINT Patient presents for Retina Follow Up   HISTORY OF PRESENT ILLNESS: [name] is a 69 y.o. male who presents to the clinic today for:   HPI    Retina Follow Up    Patient presents with  Diabetic Retinopathy.  In both eyes.  This started 4 weeks ago.  Severity is moderate.  I, the attending physician,  performed the HPI with the patient and updated documentation appropriately.          Comments    Patient here for 4 weeks retina follow up for NPDR OU. Patient states vision seems to be doing ok. No eye pain.        Last edited by [name], MD on 07/24/2020 10:03 PM. (History)      Referring physician: [name], MD [address] [place],  [place] [address]  HISTORICAL INFORMATION:   Selected notes from the MEDICAL RECORD NUMBER Referred by Dr. Wyatt Portela for concern of HTN Ret   CURRENT MEDICATIONS: No current outpatient medications on file. (Ophthalmic Drugs)   No current facility-administered medications for this visit. (Ophthalmic Drugs)   Current Outpatient Medications (Other)  Medication Sig  . aspirin EC 325 MG EC tablet Take 1 tablet (325 mg total) by mouth daily.  Marland Kitchen atorvastatin (LIPITOR) 40 MG tablet Take 1 tablet (40 mg total) by mouth daily at 6 PM.  . carvedilol (COREG) 3.125 MG tablet Take 1 tablet (3.125 mg total) by mouth 2 (two) times daily.  . carvedilol (COREG) 6.25 MG tablet Take 6.25 mg by mouth 2 (two) times daily with a meal.  . hydrochlorothiazide (HYDRODIURIL) 25 MG tablet Take 25 mg by mouth daily.  Marland Kitchen losartan (COZAAR) 50 MG tablet Take 1 tablet (50 mg total) by mouth daily.  . metFORMIN (GLUCOPHAGE) 500 MG tablet Take 1 tablet (500 mg total) by mouth 2 (two) times daily with a meal.  . metFORMIN (GLUCOPHAGE) 500 MG tablet Take by mouth 2 (two) times daily with a meal.   Current Facility-Administered Medications (Other)  Medication Route  .  0.9 %  sodium chloride infusion Intravenous  . 0.9 %  sodium chloride infusion Intravenous      REVIEW OF SYSTEMS: ROS    Positive for: Neurological, Endocrine, Eyes   Negative for: Constitutional, Gastrointestinal, Skin, Genitourinary, Musculoskeletal, HENT, Cardiovascular, Respiratory, Psychiatric, Allergic/Imm, Heme/Lymph   Last edited by Theodore Demark, COA on 07/24/2020  2:29 PM. (History)       ALLERGIES Allergies  Allergen Reactions  . Shellfish Allergy Anaphylaxis    PAST MEDICAL HISTORY Past Medical History:  Diagnosis Date  . Acute cerebrovascular accident (CVA) ([place]) 01/20/2020  . Benign essential HTN 01/20/2020  . Cataract    Mixed form OU  . Diabetic retinopathy ([place])    NPDR OU  . Hypertensive retinopathy    OU  . Seizures ([place])    per pt- "none since teenage years"   Past Surgical History:  Procedure Laterality Date  . LOOP RECORDER INSERTION N/A 01/23/2020   Procedure: LOOP RECORDER INSERTION;  Surgeon: Deboraha Sprang, MD;  Location: [facility];  Service: Cardiovascular;  Laterality: N/A;    FAMILY HISTORY Family History  Problem Relation Age of Onset  . COPD Mother   . Diabetes Mother   . Dementia Father   . Diabetes Father   . Cancer Father  prostate and lung  . Colon cancer Neg Hx   . Esophageal cancer Neg Hx   . Stomach cancer Neg Hx   . Rectal cancer Neg Hx     SOCIAL HISTORY Social History   Tobacco Use  . Smoking status: Never Smoker  . Smokeless tobacco: Never Used  Vaping Use  . Vaping Use: Never used  Substance Use Topics  . Alcohol use: Never  . Drug use: Never         OPHTHALMIC EXAM:  Base Eye Exam    Visual Acuity (Snellen - Linear)      Right Left   Dist [place] 20/40 20/30 -1   Dist ph [place] 20/30 20/25       Tonometry (Tonopen, 2:27 PM)      Right Left   Pressure 14 13       Pupils      Dark Light Shape React APD   Right 3 2 Round Brisk None   Left 3 2 Round Brisk None       Visual Fields  (Counting fingers)      Left Right    Full Full       Extraocular Movement      Right Left    Full, Ortho Full, Ortho       Neuro/Psych    Oriented x3: Yes   Mood/Affect: Normal       Dilation    Both eyes: 1.0% Mydriacyl, 2.5% Phenylephrine @ 2:27 PM        Slit Lamp and Fundus Exam    Slit Lamp Exam      Right Left   Lids/Lashes Dermatochalasis - upper lid Dermatochalasis - upper lid   Conjunctiva/Sclera Nasal and temporal Pinguecula, mild Melanosis Nasal and temporal Pinguecula, mild Melanosis   Cornea Mild Arcus, 3+ Punctate epithelial erosions, Debris in tear film Mild Arcus, 3+ Punctate epithelial erosions, Debris in tear film   Anterior Chamber Deep and quiet Deep and quiet   Iris Round and dilated, No NVI Round and dilated, No NVI   Lens 2-3+ Nuclear sclerosis, 2-3+ Cortical cataract 2-3+ Nuclear sclerosis, 2-3+ Cortical cataract   Vitreous Vitreous syneresis Vitreous syneresis       Fundus Exam      Right Left   Disc sharp rim, mild pallor, + cupping Pink and Sharp   C/D Ratio 0.5 0.5   Macula Flat, good foveal reflex, scattered MA, scattered cystic changes, CWS inferior and nasal macula -- improving, No edema Flat, good foveal reflex, scattered Microaneurysms and exudate greatest inferior to fovea--improving, +edema, +blot hemes SN macula -- improving   Vessels Vascular attenuation, Tortuous, severe attenuation temporal periphery  Vascular attenuation, Tortuous, severe attenuation temporal perhiphery   Periphery Attached, operculated hole at 1000 with partial pigment and +cuff of SRF--good laser changes surrounding, scattered MA, focal CWS just outside IT arcades Attached, scattered IRH and CWS, White without pressure temporally, focal pigmented CR scar at 0130 equator, good peripheral 360 PRP          IMAGING AND PROCEDURES  Imaging and Procedures for _0 @  OCT, Retina - OU - Both Eyes       Right Eye Quality was good. Central Foveal Thickness: 237.  Progression has been stable. Findings include normal foveal contour, no SRF, intraretinal fluid (Persistent IRF temporal macula, mild diffuse retinal thinning--stable).   Left Eye Quality was good. Central Foveal Thickness: 256. Progression has been stable. Findings include normal foveal contour, intraretinal hyper-reflective material, no SRF,  vitreomacular adhesion , no IRF (Mild interval improvement in cystic IRF temporal macula--trace cystic changes).   Notes *Images captured and stored on drive  Diagnosis / Impression:  OD: NFP, no SRF -- mild interval improvement IRF temporal macula, mild diffuse retinal thinning--stable OS: NFP, no SRF/IRF, mild interval improvement in cystic IRF temporal macula--trace cystic changes   Clinical management:  See below  Abbreviations: NFP - Normal foveal profile. CME - cystoid macular edema. PED - pigment epithelial detachment. IRF - intraretinal fluid. SRF - subretinal fluid. EZ - ellipsoid zone. ERM - epiretinal membrane. ORA - outer retinal atrophy. ORT - outer retinal tubulation. SRHM - subretinal hyper-reflective material        Intravitreal Injection, Pharmacologic Agent - OS - Left Eye       Time Out 07/24/2020. 3:45 PM. Confirmed correct patient, procedure, site, and patient consented.   Anesthesia Topical anesthesia was used. Anesthetic medications included Lidocaine 2%, Proparacaine 0.5%.   Procedure Preparation included 5% betadine to ocular surface, eyelid speculum. A supplied needle was used.   Injection:  1.25 mg Bevacizumab (AVASTIN) SOLN   NDC: 50242-060-01, Lot: [id number], Expiration date: 08/15/2020   Route: Intravitreal, Site: Left Eye, Waste: 0 mL  Post-op Post injection exam found visual acuity of at least counting fingers. The patient tolerated the procedure well. There were no complications. The patient received written and verbal post procedure care education. Post injection medications were not given.                  ASSESSMENT/PLAN:    ICD-10-CM   1. Retinal hole of right eye  H33.321   2. Moderate nonproliferative diabetic retinopathy of both eyes with macular edema associated with type 2 diabetes mellitus (HCC)  E11.3313 Intravitreal Injection, Pharmacologic Agent - OS - Left Eye    Bevacizumab (AVASTIN) SOLN 1.25 mg  3. Retinal edema  H35.81 OCT, Retina - OU - Both Eyes  4. Essential hypertension  I10   5. Hypertensive retinopathy of both eyes  H35.033   6. Combined forms of age-related cataract of both eyes  H25.813   7. Dry eyes  H04.123     1. Operculated retinal hole w/ cuff of SRF / focal RD, right eye.    - operculated hole located at 1000 with partial pigment and +cuff of SRF / focal RD  - s/p retinopexy OD (03.17.21) -- good laser changes surrounding  - monitor  2,3. Moderate to severe non-proliferative diabetic retinopathy, OU  - s/p IVA OS #1 (05.07.21), #2 (06.04.21), #3 (7.6.21)  - s/p PRP OS (04.09.21) -- good laser changes  - FA (03.26.21) shows late leaking MA OU, significant capillary drop-out   - exam shows scattered MA/IRH/CWS OU  - BCVA OD stable at 20/30; OS improved from 20/30 to 20/25  - OCT shows OD: persistent IRF temporal macula, mild diffuse retinal thinning--stable   OS: Mild interval improvement in cystic IRF temporal macula--trace cystic changes  - recommend IVA OS #4 today, 8.3.21 for +DME  - pt wishes to proceed with IVA  - RBA of procedure discussed, questions answered  - informed consent obtained, signed and scanned, 05.07.21  - see procedure note  - f/u 4 weeks -- DFE/OCT/Optos FA (Transit OS), poss. Focal laser OS  4,5. Hypertensive retinopathy OU  - discussed importance of tight BP control  - monitor  6. Mixed form age related cataract OU  - The symptoms of cataract, surgical options, and treatments and risks were discussed with patient.  -   discussed diagnosis and progression  - visually significant  - monitor  7. Dry eyes OU  -  recommend artificial tears and lubricating ointment as needed  Ophthalmic Meds Ordered this visit:  Meds ordered this encounter  Medications  . Bevacizumab (AVASTIN) SOLN 1.25 mg      Return in about 4 weeks (around 08/21/2020) for 4 wk f/u for NPDR OU w/DFE/OCT/Optos FA (Transit OS), poss focal laser OS.  There are no Patient Instructions on file for this visit.   Explained the diagnoses, plan, and follow up with the patient and they expressed understanding.  Patient expressed understanding of the importance of proper follow up care.   This document serves as a record of services personally performed by  G. , MD, PhD. It was created on their behalf by Andrew Baxley, COT an ophthalmic technician. The creation of this record is the provider's dictation and/or activities during the visit.    Electronically signed by: Andrew Baxley, COT 8.2.21 @ 10:43 PM   This document serves as a record of services personally performed by  G. , MD, PhD. It was created on their behalf by Andrew Baxley, COT an ophthalmic technician. The creation of this record is the provider's dictation and/or activities during the visit.    Electronically signed by: Andrew Baxley, COT 8.3.21 @ 10:43 PM   G. , M.D., Ph.D. Diseases & Surgery of the Retina and Vitreous [facility] 07/24/2020   I have reviewed the above documentation for accuracy and completeness, and I agree with the above.  G. , M.D., Ph.D. 07/24/20 10:43 PM   Abbreviations: M myopia (nearsighted); A astigmatism; H hyperopia (farsighted); P presbyopia; Mrx spectacle prescription;  CTL contact lenses; OD right eye; OS left eye; OU both eyes  XT exotropia; ET esotropia; PEK punctate epithelial keratitis; PEE punctate epithelial erosions; DES dry eye syndrome; MGD meibomian gland dysfunction; ATs artificial tears; PFAT's preservative free artificial tears; NSC nuclear sclerotic cataract; PSC  posterior subcapsular cataract; ERM epi-retinal membrane; PVD posterior vitreous detachment; RD retinal detachment; DM diabetes mellitus; DR diabetic retinopathy; NPDR non-proliferative diabetic retinopathy; PDR proliferative diabetic retinopathy; CSME clinically significant macular edema; DME diabetic macular edema; dbh dot blot hemorrhages; CWS cotton wool spot; POAG primary open angle glaucoma; C/D cup-to-disc ratio; HVF humphrey visual field; GVF goldmann visual field; OCT optical coherence tomography; IOP intraocular pressure; BRVO Branch retinal vein occlusion; CRVO central retinal vein occlusion; CRAO central retinal artery occlusion; BRAO branch retinal artery occlusion; RT retinal tear; SB scleral buckle; PPV pars plana vitrectomy; VH Vitreous hemorrhage; PRP panretinal laser photocoagulation; IVK intravitreal kenalog; VMT vitreomacular traction; MH Macular hole;  NVD neovascularization of the disc; NVE neovascularization elsewhere; AREDS age related eye disease study; ARMD age related macular degeneration; POAG primary open angle glaucoma; EBMD epithelial/anterior basement membrane dystrophy; ACIOL anterior chamber intraocular lens; IOL intraocular lens; PCIOL posterior chamber intraocular lens; Phaco/IOL phacoemulsification with intraocular lens placement; PRK photorefractive keratectomy; LASIK laser assisted in situ keratomileusis; HTN hypertension; DM diabetes mellitus; COPD chronic obstructive pulmonary disease  

## 2020-07-24 ENCOUNTER — Encounter (INDEPENDENT_AMBULATORY_CARE_PROVIDER_SITE_OTHER): Payer: Self-pay | Admitting: Ophthalmology

## 2020-07-24 ENCOUNTER — Ambulatory Visit (INDEPENDENT_AMBULATORY_CARE_PROVIDER_SITE_OTHER): Payer: PPO | Admitting: Ophthalmology

## 2020-07-24 ENCOUNTER — Other Ambulatory Visit: Payer: Self-pay

## 2020-07-24 DIAGNOSIS — H35033 Hypertensive retinopathy, bilateral: Secondary | ICD-10-CM

## 2020-07-24 DIAGNOSIS — H04123 Dry eye syndrome of bilateral lacrimal glands: Secondary | ICD-10-CM | POA: Diagnosis not present

## 2020-07-24 DIAGNOSIS — H25813 Combined forms of age-related cataract, bilateral: Secondary | ICD-10-CM | POA: Diagnosis not present

## 2020-07-24 DIAGNOSIS — H33321 Round hole, right eye: Secondary | ICD-10-CM | POA: Diagnosis not present

## 2020-07-24 DIAGNOSIS — I10 Essential (primary) hypertension: Secondary | ICD-10-CM

## 2020-07-24 DIAGNOSIS — H3581 Retinal edema: Secondary | ICD-10-CM

## 2020-07-24 DIAGNOSIS — E113313 Type 2 diabetes mellitus with moderate nonproliferative diabetic retinopathy with macular edema, bilateral: Secondary | ICD-10-CM | POA: Diagnosis not present

## 2020-07-24 MED ORDER — BEVACIZUMAB CHEMO INJECTION 1.25MG/0.05ML SYRINGE FOR KALEIDOSCOPE
1.2500 mg | INTRAVITREAL | Status: AC | PRN
Start: 2020-07-24 — End: 2020-07-24
  Administered 2020-07-24: 1.25 mg via INTRAVITREAL

## 2020-07-25 DIAGNOSIS — I5032 Chronic diastolic (congestive) heart failure: Secondary | ICD-10-CM | POA: Diagnosis not present

## 2020-07-25 DIAGNOSIS — I110 Hypertensive heart disease with heart failure: Secondary | ICD-10-CM | POA: Diagnosis not present

## 2020-07-25 DIAGNOSIS — Z6827 Body mass index (BMI) 27.0-27.9, adult: Secondary | ICD-10-CM | POA: Diagnosis not present

## 2020-07-25 DIAGNOSIS — Z741 Need for assistance with personal care: Secondary | ICD-10-CM | POA: Diagnosis not present

## 2020-07-25 DIAGNOSIS — E663 Overweight: Secondary | ICD-10-CM | POA: Diagnosis not present

## 2020-07-25 DIAGNOSIS — Z8673 Personal history of transient ischemic attack (TIA), and cerebral infarction without residual deficits: Secondary | ICD-10-CM | POA: Diagnosis not present

## 2020-07-25 DIAGNOSIS — R011 Cardiac murmur, unspecified: Secondary | ICD-10-CM | POA: Diagnosis not present

## 2020-07-25 DIAGNOSIS — Z008 Encounter for other general examination: Secondary | ICD-10-CM | POA: Diagnosis not present

## 2020-07-25 DIAGNOSIS — E1151 Type 2 diabetes mellitus with diabetic peripheral angiopathy without gangrene: Secondary | ICD-10-CM | POA: Diagnosis not present

## 2020-08-13 ENCOUNTER — Ambulatory Visit (INDEPENDENT_AMBULATORY_CARE_PROVIDER_SITE_OTHER): Payer: PPO | Admitting: *Deleted

## 2020-08-13 DIAGNOSIS — I639 Cerebral infarction, unspecified: Secondary | ICD-10-CM | POA: Diagnosis not present

## 2020-08-13 LAB — CUP PACEART REMOTE DEVICE CHECK
Date Time Interrogation Session: 20210820230042
Implantable Pulse Generator Implant Date: 20210201

## 2020-08-17 NOTE — Progress Notes (Signed)
[facility] Note  08/21/2020     CHIEF COMPLAINT Patient presents for Retina Follow Up   HISTORY OF PRESENT ILLNESS: [name] is a 69 y.o. male who presents to the clinic today for:   HPI    Retina Follow Up    Patient presents with  Diabetic Retinopathy.  In both eyes.  Severity is moderate.  Duration of 4 weeks.  Since onset it is stable.  I, the attending physician,  performed the HPI with the patient and updated documentation appropriately.          Comments    Patient states vision the same OU. Last a1c was 6.8, checked 5 months ago. Hasn't checked BS recently.       Last edited by [name], MD on 08/21/2020  3:14 PM. (History)      Referring physician: [name], MD [address] [place],  [place] [address]  HISTORICAL INFORMATION:   Selected notes from the MEDICAL RECORD NUMBER Referred by Dr. Wyatt Portela for concern of HTN Ret   CURRENT MEDICATIONS: No current outpatient medications on file. (Ophthalmic Drugs)   No current facility-administered medications for this visit. (Ophthalmic Drugs)   Current Outpatient Medications (Other)  Medication Sig  . aspirin EC 325 MG EC tablet Take 1 tablet (325 mg total) by mouth daily.  Marland Kitchen atorvastatin (LIPITOR) 40 MG tablet Take 1 tablet (40 mg total) by mouth daily at 6 PM.  . carvedilol (COREG) 6.25 MG tablet Take 6.25 mg by mouth 2 (two) times daily with a meal.  . clopidogrel (PLAVIX) 75 MG tablet Take 75 mg by mouth daily.  . hydrochlorothiazide (HYDRODIURIL) 25 MG tablet Take 25 mg by mouth daily.  Marland Kitchen losartan (COZAAR) 50 MG tablet Take 1 tablet (50 mg total) by mouth daily.  . metFORMIN (GLUCOPHAGE) 500 MG tablet Take by mouth 2 (two) times daily with a meal.  . carvedilol (COREG) 3.125 MG tablet Take 1 tablet (3.125 mg total) by mouth 2 (two) times daily.  . metFORMIN (GLUCOPHAGE) 500 MG tablet Take 1 tablet (500 mg total) by mouth 2 (two) times daily with a meal.    Current Facility-Administered Medications (Other)  Medication Route  . 0.9 %  sodium chloride infusion Intravenous  . 0.9 %  sodium chloride infusion Intravenous      REVIEW OF SYSTEMS: ROS    Positive for: Neurological, Endocrine, Eyes   Negative for: Constitutional, Gastrointestinal, Skin, Genitourinary, Musculoskeletal, HENT, Cardiovascular, Respiratory, Psychiatric, Allergic/Imm, Heme/Lymph   Last edited by Roselee Nova D, COT on 08/21/2020  2:56 PM. (History)       ALLERGIES Allergies  Allergen Reactions  . Shellfish Allergy Anaphylaxis    PAST MEDICAL HISTORY Past Medical History:  Diagnosis Date  . Acute cerebrovascular accident (CVA) ([place]) 01/20/2020  . Benign essential HTN 01/20/2020  . Cataract    Mixed form OU  . Diabetic retinopathy ([place])    NPDR OU  . Hypertensive retinopathy    OU  . Seizures ([place])    per pt- "none since teenage years"   Past Surgical History:  Procedure Laterality Date  . LOOP RECORDER INSERTION N/A 01/23/2020   Procedure: LOOP RECORDER INSERTION;  Surgeon: Deboraha Sprang, MD;  Location: [facility];  Service: Cardiovascular;  Laterality: N/A;    FAMILY HISTORY Family History  Problem Relation Age of Onset  . COPD Mother   . Diabetes Mother   . Dementia Father   .  Diabetes Father   . Cancer Father        prostate and lung  . Colon cancer Neg Hx   . Esophageal cancer Neg Hx   . Stomach cancer Neg Hx   . Rectal cancer Neg Hx     SOCIAL HISTORY Social History   Tobacco Use  . Smoking status: Never Smoker  . Smokeless tobacco: Never Used  Vaping Use  . Vaping Use: Never used  Substance Use Topics  . Alcohol use: Never  . Drug use: Never         OPHTHALMIC EXAM:  Base Eye Exam    Visual Acuity (Snellen - Linear)      Right Left   Dist [place] 20/50 -1 20/30 +2   Dist ph [place] 20/30 -2 20/25 -1       Tonometry (Tonopen, 3:04 PM)      Right Left   Pressure 15 18       Pupils      Dark Light Shape React  APD   Right 3 2 Round Brisk None   Left 3 2 Round Brisk None       Visual Fields (Counting fingers)      Left Right    Full Full       Extraocular Movement      Right Left    Full, Ortho Full, Ortho       Neuro/Psych    Oriented x3: Yes   Mood/Affect: Normal       Dilation    Both eyes: 1.0% Mydriacyl, 2.5% Phenylephrine @ 3:04 PM        Slit Lamp and Fundus Exam    Slit Lamp Exam      Right Left   Lids/Lashes Dermatochalasis - upper lid Dermatochalasis - upper lid   Conjunctiva/Sclera Nasal and temporal Pinguecula, mild Melanosis,  Nasal and temporal Pinguecula, mild Melanosis, Subconjunctival hemorrhage nasally and inferiorly   Cornea Arcus, 2+ Punctate epithelial erosions, Debris in tear film Mild Arcus, 3+ Punctate epithelial erosions, Debris in tear film   Anterior Chamber Deep and quiet Deep and quiet   Iris Round and dilated, No NVI Round and dilated, No NVI   Lens 2-3+ Nuclear sclerosis, 2-3+ Cortical cataract 2-3+ Nuclear sclerosis, 2-3+ Cortical cataract   Vitreous Vitreous syneresis Vitreous syneresis       Fundus Exam      Right Left   Disc sharp rim, mild pallor, +cupping Pink and Sharp   C/D Ratio 0.5 0.5   Macula Flat, good foveal reflex, scattered MA, scattered cystic changes - improved, No edema Flat, good foveal reflex, scattered Microaneurysms and exudate greatest inferior to fovea--improving, recurrent cystic changes nasal to fovea, +edema, +blot hemes SN macula -- improving   Vessels Vascular attenuation, Tortuous, severe attenuation temporal periphery  Vascular attenuation, Tortuous, severe attenuation temporal perhiphery   Periphery Attached, operculated hole at 1000 with partial pigment and +cuff of SRF--good laser changes surrounding, scattered MA Attached, scattered IRH and CWS, White without pressure temporally, focal pigmented CR scar at 0130 equator, good peripheral 360 PRP          IMAGING AND PROCEDURES  Imaging and Procedures for  _0 @  OCT, Retina - OU - Both Eyes       Right Eye Quality was good. Central Foveal Thickness: 235. Progression has improved. Findings include normal foveal contour, no SRF, no IRF (Interval improvement in trace cystic changes, mild diffuse retinal thinning--stable).   Left Eye Quality was good. Central  Foveal Thickness: 244. Progression has worsened. Findings include normal foveal contour, intraretinal hyper-reflective material, no SRF, vitreomacular adhesion , intraretinal fluid (Mild interval increase in nasal IRF/cystic changes).   Notes *Images captured and stored on drive  Diagnosis / Impression:  OD: NFP, no SRF -- Interval improvement in trace cystic changes, mild diffuse retinal thinning  OS: NFP, no SRF/IRF, Mild interval increase in nasal IRF/cystic changes   Clinical management:  See below  Abbreviations: NFP - Normal foveal profile. CME - cystoid macular edema. PED - pigment epithelial detachment. IRF - intraretinal fluid. SRF - subretinal fluid. EZ - ellipsoid zone. ERM - epiretinal membrane. ORA - outer retinal atrophy. ORT - outer retinal tubulation. SRHM - subretinal hyper-reflective material        Intravitreal Injection, Pharmacologic Agent - OS - Left Eye       Time Out 08/21/2020. 3:26 PM. Confirmed correct patient, procedure, site, and patient consented.   Anesthesia Topical anesthesia was used. Anesthetic medications included Lidocaine 2%, Proparacaine 0.5%.   Procedure Preparation included 5% betadine to ocular surface, eyelid speculum. A supplied needle was used.   Injection:  1.25 mg Bevacizumab (AVASTIN) SOLN   NDC: 40973-532-99, Lot: [id number] , Expiration date: 10/07/2020   Route: Intravitreal, Site: Left Eye, Waste: 0 mL  Post-op Post injection exam found visual acuity of at least counting fingers. The patient tolerated the procedure well. There were no complications. The patient received written and verbal post procedure care education.  Post injection medications were not given.                 ASSESSMENT/PLAN:    ICD-10-CM   1. Retinal hole of right eye  H33.321   2. Moderate nonproliferative diabetic retinopathy of both eyes with macular edema associated with type 2 diabetes mellitus (HCC)  M42.6834 Intravitreal Injection, Pharmacologic Agent - OS - Left Eye    Bevacizumab (AVASTIN) SOLN 1.25 mg  3. Retinal edema  H35.81 OCT, Retina - OU - Both Eyes  4. Essential hypertension  I10   5. Hypertensive retinopathy of both eyes  H35.033   6. Combined forms of age-related cataract of both eyes  H25.813   7. Dry eyes  H04.123     1. Operculated retinal hole w/ cuff of SRF / focal RD, right eye.    - operculated hole located at 1000 with partial pigment and +cuff of SRF / focal RD  - s/p retinopexy OD (03.17.21) -- good laser changes surrounding  - monitor  2,3. Moderate to severe non-proliferative diabetic retinopathy, OU  - s/p IVA OS #1 (05.07.21), #2 (06.04.21), #3 (7.6.21), #4 (8.3.21)  - s/p PRP OS (04.09.21) -- good laser changes  - FA (03.26.21) shows late leaking MA OU, significant capillary drop-out   - exam shows scattered MA/IRH/CWS OU  - BCVA OD stable at 20/30; OS improved from 20/30 to 20/25  - OCT shows OD: Interval improvement in trace cystic changes, mild diffuse retinal thinning--stable   OS: Mild interval increase in nasal IRF/cystic changes  - recommend IVA OS #5 today, 08.31.21 for +DME  - pt wishes to proceed with IVA  - RBA of procedure discussed, questions answered  - informed consent obtained, signed and scanned, 05.07.21  - see procedure note  - f/u 4 weeks -- DFE/OCT/Optos FA (Transit OS), poss. Focal laser OS  4,5. Hypertensive retinopathy OU  - discussed importance of tight BP control  - monitor  6. Mixed form age related cataract OU  - The symptoms of  cataract, surgical options, and treatments and risks were discussed with patient.  - discussed diagnosis and progression  -  visually significant  - monitor  7. Dry eyes OU  - recommend artificial tears and lubricating ointment as needed  Ophthalmic Meds Ordered this visit:  Meds ordered this encounter  Medications  . Bevacizumab (AVASTIN) SOLN 1.25 mg      Return in about 4 weeks (around 09/18/2020) for f/u NPDR OU, DFE, OCT.  There are no Patient Instructions on file for this visit.   Explained the diagnoses, plan, and follow up with the patient and they expressed understanding.  Patient expressed understanding of the importance of proper follow up care.   This document serves as a record of services personally performed by Gardiner Sleeper, MD, PhD. It was created on their behalf by Estill Bakes, COT an ophthalmic technician. The creation of this record is the provider's dictation and/or activities during the visit.    Electronically signed by: Estill Bakes, COT 8.27.21 @ 5:14 PM   This document serves as a record of services personally performed by Gardiner Sleeper, MD, PhD. It was created on their behalf by San Jetty. Owens Shark, OA an ophthalmic technician. The creation of this record is the provider's dictation and/or activities during the visit.    Electronically signed by: San Jetty. Owens Shark, [place] 08.31.2021 5:14 PM  Gardiner Sleeper, M.D., Ph.D. Diseases & Surgery of the Retina and [place] 08/21/2020   I have reviewed the above documentation for accuracy and completeness, and I agree with the above. Gardiner Sleeper, M.D., Ph.D. 08/21/20 5:15 PM    Abbreviations: M myopia (nearsighted); A astigmatism; H hyperopia (farsighted); P presbyopia; Mrx spectacle prescription;  CTL contact lenses; OD right eye; OS left eye; OU both eyes  XT exotropia; ET esotropia; PEK punctate epithelial keratitis; PEE punctate epithelial erosions; DES dry eye syndrome; MGD meibomian gland dysfunction; ATs artificial tears; PFAT's preservative free artificial tears; [place] nuclear sclerotic  cataract; PSC posterior subcapsular cataract; ERM epi-retinal membrane; PVD posterior vitreous detachment; RD retinal detachment; DM diabetes mellitus; DR diabetic retinopathy; NPDR non-proliferative diabetic retinopathy; PDR proliferative diabetic retinopathy; CSME clinically significant macular edema; DME diabetic macular edema; dbh dot blot hemorrhages; CWS cotton wool spot; POAG primary open angle glaucoma; C/D cup-to-disc ratio; HVF humphrey visual field; GVF goldmann visual field; OCT optical coherence tomography; IOP intraocular pressure; BRVO Branch retinal vein occlusion; CRVO central retinal vein occlusion; CRAO central retinal artery occlusion; BRAO branch retinal artery occlusion; RT retinal tear; SB scleral buckle; PPV pars plana vitrectomy; VH Vitreous hemorrhage; PRP panretinal laser photocoagulation; IVK intravitreal kenalog; VMT vitreomacular traction; MH Macular hole;  NVD neovascularization of the disc; NVE neovascularization elsewhere; AREDS age related eye disease study; ARMD age related macular degeneration; POAG primary open angle glaucoma; EBMD epithelial/anterior basement membrane dystrophy; ACIOL anterior chamber intraocular lens; IOL intraocular lens; PCIOL posterior chamber intraocular lens; Phaco/IOL phacoemulsification with intraocular lens placement; [place] photorefractive keratectomy; LASIK laser assisted in situ keratomileusis; HTN hypertension; DM diabetes mellitus; COPD chronic obstructive pulmonary disease

## 2020-08-17 NOTE — Progress Notes (Signed)
[facility] Summary Report / Loop Recorder 

## 2020-08-21 ENCOUNTER — Encounter (INDEPENDENT_AMBULATORY_CARE_PROVIDER_SITE_OTHER): Payer: Self-pay | Admitting: Ophthalmology

## 2020-08-21 ENCOUNTER — Ambulatory Visit (INDEPENDENT_AMBULATORY_CARE_PROVIDER_SITE_OTHER): Payer: PPO | Admitting: Ophthalmology

## 2020-08-21 ENCOUNTER — Other Ambulatory Visit: Payer: Self-pay

## 2020-08-21 DIAGNOSIS — H35033 Hypertensive retinopathy, bilateral: Secondary | ICD-10-CM

## 2020-08-21 DIAGNOSIS — H04123 Dry eye syndrome of bilateral lacrimal glands: Secondary | ICD-10-CM

## 2020-08-21 DIAGNOSIS — H3581 Retinal edema: Secondary | ICD-10-CM | POA: Diagnosis not present

## 2020-08-21 DIAGNOSIS — I10 Essential (primary) hypertension: Secondary | ICD-10-CM

## 2020-08-21 DIAGNOSIS — H25813 Combined forms of age-related cataract, bilateral: Secondary | ICD-10-CM | POA: Diagnosis not present

## 2020-08-21 DIAGNOSIS — H33321 Round hole, right eye: Secondary | ICD-10-CM | POA: Diagnosis not present

## 2020-08-21 DIAGNOSIS — E113313 Type 2 diabetes mellitus with moderate nonproliferative diabetic retinopathy with macular edema, bilateral: Secondary | ICD-10-CM | POA: Diagnosis not present

## 2020-08-21 MED ORDER — BEVACIZUMAB CHEMO INJECTION 1.25MG/0.05ML SYRINGE FOR KALEIDOSCOPE
1.2500 mg | INTRAVITREAL | Status: AC | PRN
  Administered 2020-08-21: 1.25 mg via INTRAVITREAL

## 2020-08-24 DIAGNOSIS — Z23 Encounter for immunization: Secondary | ICD-10-CM | POA: Diagnosis not present

## 2020-08-24 DIAGNOSIS — I110 Hypertensive heart disease with heart failure: Secondary | ICD-10-CM | POA: Diagnosis not present

## 2020-08-24 DIAGNOSIS — R413 Other amnesia: Secondary | ICD-10-CM | POA: Diagnosis not present

## 2020-08-24 DIAGNOSIS — R634 Abnormal weight loss: Secondary | ICD-10-CM | POA: Diagnosis not present

## 2020-08-24 DIAGNOSIS — E663 Overweight: Secondary | ICD-10-CM | POA: Diagnosis not present

## 2020-08-24 DIAGNOSIS — Z6829 Body mass index (BMI) 29.0-29.9, adult: Secondary | ICD-10-CM | POA: Diagnosis not present

## 2020-08-24 DIAGNOSIS — I5032 Chronic diastolic (congestive) heart failure: Secondary | ICD-10-CM | POA: Diagnosis not present

## 2020-09-16 LAB — CUP PACEART REMOTE DEVICE CHECK
Date Time Interrogation Session: 20210922230441
Implantable Pulse Generator Implant Date: 20210201

## 2020-09-17 ENCOUNTER — Ambulatory Visit (INDEPENDENT_AMBULATORY_CARE_PROVIDER_SITE_OTHER): Payer: PPO | Admitting: Emergency Medicine

## 2020-09-17 DIAGNOSIS — I639 Cerebral infarction, unspecified: Secondary | ICD-10-CM | POA: Diagnosis not present

## 2020-09-17 NOTE — Progress Notes (Addendum)
[facility] Note  09/18/2020     CHIEF COMPLAINT Patient presents for Retina Follow Up   HISTORY OF PRESENT ILLNESS: [name] is a 69 y.o. male who presents to the clinic today for:   HPI    Retina Follow Up    Patient presents with  Diabetic Retinopathy.  In both eyes.  This started 4 weeks ago.  I, the attending physician,  performed the HPI with the patient and updated documentation appropriately.          Comments    Patient here for 4 weeks retina follow up for NPDR OU. Patient states vision feels doing ok. No problems. No eye pain.        Last edited by [name], MD on 09/18/2020  4:35 PM. (History)      Referring physician: [name], MD [address] [place],  [place] [address]  HISTORICAL INFORMATION:   Selected notes from the MEDICAL RECORD NUMBER Referred by Dr. Wyatt Portela for concern of HTN Ret   CURRENT MEDICATIONS: No current outpatient medications on file. (Ophthalmic Drugs)   No current facility-administered medications for this visit. (Ophthalmic Drugs)   Current Outpatient Medications (Other)  Medication Sig  . aspirin EC 325 MG EC tablet Take 1 tablet (325 mg total) by mouth daily.  Marland Kitchen atorvastatin (LIPITOR) 40 MG tablet Take 1 tablet (40 mg total) by mouth daily at 6 PM.  . carvedilol (COREG) 3.125 MG tablet Take 1 tablet (3.125 mg total) by mouth 2 (two) times daily.  . carvedilol (COREG) 6.25 MG tablet Take 6.25 mg by mouth 2 (two) times daily with a meal.  . clopidogrel (PLAVIX) 75 MG tablet Take 75 mg by mouth daily.  . hydrochlorothiazide (HYDRODIURIL) 25 MG tablet Take 25 mg by mouth daily.  Marland Kitchen losartan (COZAAR) 50 MG tablet Take 1 tablet (50 mg total) by mouth daily.  . metFORMIN (GLUCOPHAGE) 500 MG tablet Take 1 tablet (500 mg total) by mouth 2 (two) times daily with a meal.  . metFORMIN (GLUCOPHAGE) 500 MG tablet Take by mouth 2 (two) times daily with a meal.   Current  Facility-Administered Medications (Other)  Medication Route  . 0.9 %  sodium chloride infusion Intravenous  . 0.9 %  sodium chloride infusion Intravenous      REVIEW OF SYSTEMS: ROS    Positive for: Neurological, Endocrine, Eyes   Negative for: Constitutional, Gastrointestinal, Skin, Genitourinary, Musculoskeletal, HENT, Cardiovascular, Respiratory, Psychiatric, Allergic/Imm, Heme/Lymph   Last edited by Theodore Demark, COA on 09/18/2020  2:57 PM. (History)       ALLERGIES Allergies  Allergen Reactions  . Shellfish Allergy Anaphylaxis    PAST MEDICAL HISTORY Past Medical History:  Diagnosis Date  . Acute cerebrovascular accident (CVA) ([place]) 01/20/2020  . Benign essential HTN 01/20/2020  . Cataract    Mixed form OU  . Diabetic retinopathy ([place])    NPDR OU  . Hypertensive retinopathy    OU  . Seizures ([place])    per pt- "none since teenage years"   Past Surgical History:  Procedure Laterality Date  . LOOP RECORDER INSERTION N/A 01/23/2020   Procedure: LOOP RECORDER INSERTION;  Surgeon: Deboraha Sprang, MD;  Location: [facility];  Service: Cardiovascular;  Laterality: N/A;    FAMILY HISTORY Family History  Problem Relation Age of Onset  . COPD Mother   . Diabetes Mother   . Dementia Father   . Diabetes Father   .  Cancer Father        prostate and lung  . Colon cancer Neg Hx   . Esophageal cancer Neg Hx   . Stomach cancer Neg Hx   . Rectal cancer Neg Hx     SOCIAL HISTORY Social History   Tobacco Use  . Smoking status: Never Smoker  . Smokeless tobacco: Never Used  Vaping Use  . Vaping Use: Never used  Substance Use Topics  . Alcohol use: Never  . Drug use: Never         OPHTHALMIC EXAM:  Base Eye Exam    Visual Acuity (Snellen - Linear)      Right Left   Dist [place] 20/40 -2 20/30   Dist ph [place] 20/30 -1 20/25 -2       Tonometry (Tonopen, 2:54 PM)      Right Left   Pressure 15 16       Pupils      Dark Light Shape React APD   Right 3  2 Round Brisk None   Left 3 2 Round Brisk None       Visual Fields (Counting fingers)      Left Right    Full Full       Extraocular Movement      Right Left    Full, Ortho Full, Ortho       Neuro/Psych    Oriented x3: Yes   Mood/Affect: Normal       Dilation    Both eyes: 1.0% Mydriacyl, 2.5% Phenylephrine @ 2:54 PM        Slit Lamp and Fundus Exam    Slit Lamp Exam      Right Left   Lids/Lashes Dermatochalasis - upper lid Dermatochalasis - upper lid   Conjunctiva/Sclera Nasal and temporal Pinguecula, mild Melanosis,  Nasal and temporal Pinguecula, mild Melanosis, Subconjunctival hemorrhage nasally and inferiorly   Cornea Arcus, 2-3+ Punctate epithelial erosions, Debris in tear film Mild Arcus, 3+ Punctate epithelial erosions, Debris in tear film   Anterior Chamber Deep and quiet Deep and quiet   Iris Round and dilated, No NVI Round and dilated, No NVI   Lens 2-3+ Nuclear sclerosis, 2-3+ Cortical cataract 2-3+ Nuclear sclerosis, 2-3+ Cortical cataract   Vitreous Vitreous syneresis Vitreous syneresis       Fundus Exam      Right Left   Disc sharp rim, mild pallor, +cupping Pink and Sharp   C/D Ratio 0.5 0.5   Macula Flat, good foveal reflex, scattered MA, scattered cystic changes - persistent, but very mild (non-central), No edema Flat, good foveal reflex, scattered Microaneurysms and exudate--improving, scattered edema and blot hemes - improved   Vessels Vascular attenuation, Tortuous, severe attenuation temporal periphery  Vascular attenuation, Tortuous, severe attenuation temporal perhiphery   Periphery Attached, operculated hole at 1000 with partial pigment and +cuff of SRF--good laser changes surrounding, scattered MA Attached, scattered IRH and CWS, White without pressure temporally, focal pigmented CR scar at 0130 equator, good peripheral 360 PRP          IMAGING AND PROCEDURES  Imaging and Procedures for @TODAY @  OCT, Retina - OU - Both Eyes       Right  Eye Quality was good. Central Foveal Thickness: 236. Progression has been stable. Findings include normal foveal contour, no SRF, intraretinal fluid (persistent trace cystic changes (non-central), mild diffuse retinal thinning--stable).   Left Eye Quality was good. Central Foveal Thickness: 247. Progression has improved. Findings include normal foveal contour, intraretinal  hyper-reflective material, no SRF, vitreomacular adhesion , intraretinal fluid (Mild interval improvement in IRF -- just mild cystic changes inferiorly).   Notes *Images captured and stored on drive  Diagnosis / Impression:  OD: NFP, no SRF -- persistent trace cystic changes (non-central), mild diffuse retinal thinning  OS: NFP, no SRF/IRF, Mild interval improvement in IRF -- just mild cystic changes inferiorly   Clinical management:  See below  Abbreviations: NFP - Normal foveal profile. CME - cystoid macular edema. PED - pigment epithelial detachment. IRF - intraretinal fluid. SRF - subretinal fluid. EZ - ellipsoid zone. ERM - epiretinal membrane. ORA - outer retinal atrophy. ORT - outer retinal tubulation. SRHM - subretinal hyper-reflective material        Fluorescein Angiography Optos (Transit OS)       Right Eye   Progression has improved. Early phase findings include vascular perfusion defect, microaneurysm (Very hazy images). Mid/Late phase findings include vascular perfusion defect, microaneurysm, leakage (Large area of vascular non-perfusion IT periphery, late leaking MA's greatest posteriorly, no NV).   Left Eye   Progression has improved. Early phase findings include staining, microaneurysm, vascular perfusion defect (Hazy early images). Mid/Late phase findings include staining, leakage, microaneurysm, vascular perfusion defect (No NV, +late leaking MA).   Notes **Images stored on drive**  Impression: moderate NPDR OU late leaking MA OU significant capillary drop-out OU No NV OU          Intravitreal Injection, Pharmacologic Agent - OS - Left Eye       Time Out 09/18/2020. 3:38 PM. Confirmed correct patient, procedure, site, and patient consented.   Anesthesia Topical anesthesia was used. Anesthetic medications included Lidocaine 2%, Proparacaine 0.5%.   Procedure Preparation included 5% betadine to ocular surface, eyelid speculum. A (32g) needle was used.   Injection:  1.25 mg Bevacizumab (AVASTIN) SOLN   NDC: 70360-001-02, Lot: [id number], Expiration date: 10/28/2020   Route: Intravitreal, Site: Left Eye, Waste: 0.05 mL  Post-op Post injection exam found visual acuity of at least counting fingers. The patient tolerated the procedure well. There were no complications. The patient received written and verbal post procedure care education. Post injection medications were not given.                 ASSESSMENT/PLAN:    ICD-10-CM   1. Retinal hole of right eye  H33.321   2. Moderate nonproliferative diabetic retinopathy of both eyes with macular edema associated with type 2 diabetes mellitus (HCC)  S34.1962 Fluorescein Angiography Optos (Transit OS)    Intravitreal Injection, Pharmacologic Agent - OS - Left Eye    Bevacizumab (AVASTIN) SOLN 1.25 mg  3. Retinal edema  H35.81 OCT, Retina - OU - Both Eyes  4. Essential hypertension  I10   5. Hypertensive retinopathy of both eyes  H35.033 Fluorescein Angiography Optos (Transit OS)  6. Combined forms of age-related cataract of both eyes  H25.813   7. Dry eyes  H04.123     1. Operculated retinal hole w/ cuff of SRF / focal RD, right eye.    - operculated hole located at 1000 with partial pigment and +cuff of SRF / focal RD  - s/p retinopexy OD (03.17.21) -- good laser changes surrounding  - monitor  2,3. Moderate to severe non-proliferative diabetic retinopathy, OU  - s/p IVA OS #1 (05.07.21), #2 (06.04.21), #3 (07.06.21), #4 (08.03.21), #5 (08.31.21)  - s/p PRP OS (04.09.21) -- good laser changes  - repeat FA  (09.28.21) shows late leaking MA OU, significant capillary  drop-out   - exam shows scattered MA/IRH/CWS OU  - BCVA OD stable at 20/30; OS stable at 20/25  - OCT shows OD: persistent trace cystic changes (non-central); mild diffuse retinal thinning OS: Mild interval improvement in IRF -- just mild cystic changes inferiorly  - recommend IVA OS #6 today, 09.28.21 for +DME w/ extension to 5 wks  - pt wishes to proceed with IVA OS  - RBA of procedure discussed, questions answered  - informed consent obtained, signed and scanned, 05.07.21  - see procedure note  - f/u 5 weeks -- DFE/OCT  4,5. Hypertensive retinopathy OU  - discussed importance of tight BP control  - monitor  6. Mixed form age related cataract OU  - The symptoms of cataract, surgical options, and treatments and risks were discussed with patient.  - discussed diagnosis and progression  - under the expert management of Dr. Zenia Resides  - now that macular edema is improved and relatively stable, pt is clear from a retina standpoint to proceed with cataract surgery when pt and surgeon are ready  - will send back to Dr. Zenia Resides for cataract f/u / consult  7. Dry eyes OU  - recommend artificial tears and lubricating ointment as needed  Ophthalmic Meds Ordered this visit:  Meds ordered this encounter  Medications  . Bevacizumab (AVASTIN) SOLN 1.25 mg      Return in about 5 weeks (around 10/23/2020) for f/u NPDR OU, DFE, OCT.  There are no Patient Instructions on file for this visit.   Explained the diagnoses, plan, and follow up with the patient and they expressed understanding.  Patient expressed understanding of the importance of proper follow up care.   This document serves as a record of services personally performed by Gardiner Sleeper, MD, PhD. It was created on their behalf by Estill Bakes, COT an ophthalmic technician. The creation of this record is the provider's dictation and/or activities during the visit.     Electronically signed by: Estill Bakes, COT 9.27.21 @ 4:37 PM   This document serves as a record of services personally performed by Gardiner Sleeper, MD, PhD. It was created on their behalf by San Jetty. Owens Shark, OA an ophthalmic technician. The creation of this record is the provider's dictation and/or activities during the visit.    Electronically signed by: San Jetty. Marguerita Merles 09.28.2021 4:37 PM  Gardiner Sleeper, M.D., Ph.D. Diseases & Surgery of the Retina and [place] 09/18/2020   I have reviewed the above documentation for accuracy and completeness, and I agree with the above. Gardiner Sleeper, M.D., Ph.D. 09/18/20 4:40 PM   Abbreviations: M myopia (nearsighted); A astigmatism; H hyperopia (farsighted); P presbyopia; Mrx spectacle prescription;  CTL contact lenses; OD right eye; OS left eye; OU both eyes  XT exotropia; ET esotropia; PEK punctate epithelial keratitis; PEE punctate epithelial erosions; DES dry eye syndrome; MGD meibomian gland dysfunction; ATs artificial tears; PFAT's preservative free artificial tears; [place] nuclear sclerotic cataract; PSC posterior subcapsular cataract; ERM epi-retinal membrane; PVD posterior vitreous detachment; RD retinal detachment; DM diabetes mellitus; DR diabetic retinopathy; NPDR non-proliferative diabetic retinopathy; PDR proliferative diabetic retinopathy; CSME clinically significant macular edema; DME diabetic macular edema; dbh dot blot hemorrhages; CWS cotton wool spot; POAG primary open angle glaucoma; C/D cup-to-disc ratio; HVF humphrey visual field; GVF goldmann visual field; OCT optical coherence tomography; IOP intraocular pressure; BRVO Branch retinal vein occlusion; CRVO central retinal vein occlusion; CRAO central retinal artery occlusion;  BRAO branch retinal artery occlusion; RT retinal tear; SB scleral buckle; PPV pars plana vitrectomy; VH Vitreous hemorrhage; PRP panretinal laser photocoagulation; IVK  intravitreal kenalog; VMT vitreomacular traction; MH Macular hole;  NVD neovascularization of the disc; NVE neovascularization elsewhere; AREDS age related eye disease study; ARMD age related macular degeneration; POAG primary open angle glaucoma; EBMD epithelial/anterior basement membrane dystrophy; ACIOL anterior chamber intraocular lens; IOL intraocular lens; PCIOL posterior chamber intraocular lens; Phaco/IOL phacoemulsification with intraocular lens placement; [place] photorefractive keratectomy; LASIK laser assisted in situ keratomileusis; HTN hypertension; DM diabetes mellitus; COPD chronic obstructive pulmonary disease

## 2020-09-18 ENCOUNTER — Ambulatory Visit (INDEPENDENT_AMBULATORY_CARE_PROVIDER_SITE_OTHER): Payer: PPO | Admitting: Ophthalmology

## 2020-09-18 ENCOUNTER — Other Ambulatory Visit: Payer: Self-pay

## 2020-09-18 ENCOUNTER — Encounter (INDEPENDENT_AMBULATORY_CARE_PROVIDER_SITE_OTHER): Payer: Self-pay | Admitting: Ophthalmology

## 2020-09-18 DIAGNOSIS — H25813 Combined forms of age-related cataract, bilateral: Secondary | ICD-10-CM | POA: Diagnosis not present

## 2020-09-18 DIAGNOSIS — H3581 Retinal edema: Secondary | ICD-10-CM

## 2020-09-18 DIAGNOSIS — I10 Essential (primary) hypertension: Secondary | ICD-10-CM | POA: Diagnosis not present

## 2020-09-18 DIAGNOSIS — H35033 Hypertensive retinopathy, bilateral: Secondary | ICD-10-CM

## 2020-09-18 DIAGNOSIS — E113313 Type 2 diabetes mellitus with moderate nonproliferative diabetic retinopathy with macular edema, bilateral: Secondary | ICD-10-CM

## 2020-09-18 DIAGNOSIS — H04123 Dry eye syndrome of bilateral lacrimal glands: Secondary | ICD-10-CM

## 2020-09-18 DIAGNOSIS — H33321 Round hole, right eye: Secondary | ICD-10-CM

## 2020-09-18 MED ORDER — BEVACIZUMAB CHEMO INJECTION 1.25MG/0.05ML SYRINGE FOR KALEIDOSCOPE
1.2500 mg | INTRAVITREAL | Status: AC | PRN
  Administered 2020-09-18: 1.25 mg via INTRAVITREAL

## 2020-09-19 NOTE — Progress Notes (Signed)
[facility] Summary Report / Loop Recorder 

## 2020-09-24 DIAGNOSIS — E663 Overweight: Secondary | ICD-10-CM | POA: Diagnosis not present

## 2020-09-24 DIAGNOSIS — H3581 Retinal edema: Secondary | ICD-10-CM | POA: Diagnosis not present

## 2020-09-24 DIAGNOSIS — E113313 Type 2 diabetes mellitus with moderate nonproliferative diabetic retinopathy with macular edema, bilateral: Secondary | ICD-10-CM | POA: Diagnosis not present

## 2020-09-24 DIAGNOSIS — R011 Cardiac murmur, unspecified: Secondary | ICD-10-CM | POA: Diagnosis not present

## 2020-09-24 DIAGNOSIS — H33321 Round hole, right eye: Secondary | ICD-10-CM | POA: Diagnosis not present

## 2020-09-24 DIAGNOSIS — R413 Other amnesia: Secondary | ICD-10-CM | POA: Diagnosis not present

## 2020-09-24 DIAGNOSIS — I110 Hypertensive heart disease with heart failure: Secondary | ICD-10-CM | POA: Diagnosis not present

## 2020-09-24 DIAGNOSIS — H25813 Combined forms of age-related cataract, bilateral: Secondary | ICD-10-CM | POA: Diagnosis not present

## 2020-09-24 DIAGNOSIS — I5032 Chronic diastolic (congestive) heart failure: Secondary | ICD-10-CM | POA: Diagnosis not present

## 2020-09-24 DIAGNOSIS — H04123 Dry eye syndrome of bilateral lacrimal glands: Secondary | ICD-10-CM | POA: Diagnosis not present

## 2020-09-24 DIAGNOSIS — E1151 Type 2 diabetes mellitus with diabetic peripheral angiopathy without gangrene: Secondary | ICD-10-CM | POA: Diagnosis not present

## 2020-09-24 DIAGNOSIS — H35033 Hypertensive retinopathy, bilateral: Secondary | ICD-10-CM | POA: Diagnosis not present

## 2020-09-26 ENCOUNTER — Ambulatory Visit: Payer: PPO | Admitting: Adult Health

## 2020-10-19 NOTE — Progress Notes (Signed)
[facility] Note  10/23/2020     CHIEF COMPLAINT Patient presents for Retina Follow Up   HISTORY OF PRESENT ILLNESS: [name] is a 69 y.o. male who presents to the clinic today for:   HPI    Retina Follow Up    Patient presents with  Diabetic Retinopathy.  In both eyes.  This started 5 weeks ago.  I, the attending physician,  performed the HPI with the patient and updated documentation appropriately.          Comments    Patient here for 5 weeks retina follow up for NPDR OU. Patient states vision about the same. No difference. No eye pain.        Last edited by [name], MD on 10/23/2020 10:10 PM. (History)     Patient states vision the same OU.   Referring physician: Sonia Side., FNP [place],  [place] [address]  HISTORICAL INFORMATION:   Selected notes from the MEDICAL RECORD NUMBER Referred by Dr. Wyatt Portela for concern of HTN Ret   CURRENT MEDICATIONS: No current outpatient medications on file. (Ophthalmic Drugs)   No current facility-administered medications for this visit. (Ophthalmic Drugs)   Current Outpatient Medications (Other)  Medication Sig  . aspirin EC 325 MG EC tablet Take 1 tablet (325 mg total) by mouth daily.  Marland Kitchen atorvastatin (LIPITOR) 40 MG tablet Take 1 tablet (40 mg total) by mouth daily at 6 PM.  . carvedilol (COREG) 3.125 MG tablet Take 1 tablet (3.125 mg total) by mouth 2 (two) times daily.  . carvedilol (COREG) 6.25 MG tablet Take 6.25 mg by mouth 2 (two) times daily with a meal.  . clopidogrel (PLAVIX) 75 MG tablet Take 75 mg by mouth daily.  . hydrochlorothiazide (HYDRODIURIL) 25 MG tablet Take 25 mg by mouth daily.  Marland Kitchen losartan (COZAAR) 50 MG tablet Take 1 tablet (50 mg total) by mouth daily.  . metFORMIN (GLUCOPHAGE) 500 MG tablet Take 1 tablet (500 mg total) by mouth 2 (two) times daily with a meal.  . metFORMIN (GLUCOPHAGE) 500 MG tablet Take by mouth 2 (two) times daily with a  meal.   Current Facility-Administered Medications (Other)  Medication Route  . 0.9 %  sodium chloride infusion Intravenous  . 0.9 %  sodium chloride infusion Intravenous      REVIEW OF SYSTEMS: ROS    Positive for: Neurological, Endocrine, Eyes   Negative for: Constitutional, Gastrointestinal, Skin, Genitourinary, Musculoskeletal, HENT, Cardiovascular, Respiratory, Psychiatric, Allergic/Imm, Heme/Lymph   Last edited by Theodore Demark, COA on 10/23/2020  3:18 PM. (History)       ALLERGIES Allergies  Allergen Reactions  . Shellfish Allergy Anaphylaxis    PAST MEDICAL HISTORY Past Medical History:  Diagnosis Date  . Acute cerebrovascular accident (CVA) ([place]) 01/20/2020  . Benign essential HTN 01/20/2020  . Cataract    Mixed form OU  . Diabetic retinopathy ([name])    NPDR OU  . Hypertensive retinopathy    OU  . Seizures ([place])    per pt- "none since teenage years"   Past Surgical History:  Procedure Laterality Date  . LOOP RECORDER INSERTION N/A 01/23/2020   Procedure: LOOP RECORDER INSERTION;  Surgeon: Deboraha Sprang, MD;  Location: [facility];  Service: Cardiovascular;  Laterality: N/A;    FAMILY HISTORY Family History  Problem Relation Age of Onset  . COPD Mother   . Diabetes Mother   . Dementia Father   .  Diabetes Father   . Cancer Father        prostate and lung  . Colon cancer Neg Hx   . Esophageal cancer Neg Hx   . Stomach cancer Neg Hx   . Rectal cancer Neg Hx     SOCIAL HISTORY Social History   Tobacco Use  . Smoking status: Never Smoker  . Smokeless tobacco: Never Used  Vaping Use  . Vaping Use: Never used  Substance Use Topics  . Alcohol use: Never  . Drug use: Never         OPHTHALMIC EXAM:  Base Eye Exam    Visual Acuity (Snellen - Linear)      Right Left   Dist [place] 20/40 20/30 -1   Dist ph [place] 20/30 +2 20/20       Tonometry (Tonopen, 3:15 PM)      Right Left   Pressure 14 14       Pupils      Dark Light Shape React  APD   Right 3 2 Round Brisk None   Left 3 2 Round Brisk None       Visual Fields (Counting fingers)      Left Right    Full Full       Extraocular Movement      Right Left    Full, Ortho Full, Ortho       Neuro/Psych    Oriented x3: Yes   Mood/Affect: Normal       Dilation    Both eyes: 1.0% Mydriacyl, 2.5% Phenylephrine @ 3:15 PM        Slit Lamp and Fundus Exam    Slit Lamp Exam      Right Left   Lids/Lashes Dermatochalasis - upper lid Dermatochalasis - upper lid   Conjunctiva/Sclera Nasal and temporal Pinguecula, mild Melanosis,  Nasal and temporal Pinguecula, mild Melanosis, Subconjunctival hemorrhage nasally and inferiorly   Cornea Arcus, 2-3+ Punctate epithelial erosions, Debris in tear film Mild Arcus, 3+ Punctate epithelial erosions, Debris in tear film   Anterior Chamber Deep and quiet Deep and quiet   Iris Round and dilated, No NVI Round and dilated, No NVI   Lens 2-3+ Nuclear sclerosis, 2-3+ Cortical cataract 2-3+ Nuclear sclerosis, 2-3+ Cortical cataract   Vitreous Vitreous syneresis Vitreous syneresis       Fundus Exam      Right Left   Disc sharp rim, mild pallor, +cupping Pink and Sharp   C/D Ratio 0.5 0.5   Macula Flat, good foveal reflex, scattered MA, scattered cystic changes - persistent, but very mild (non-central), No edema Flat, good foveal reflex, scattered Microaneurysms and exudate--improving, scattered edema and blot hemes - increased inferiorly   Vessels Vascular attenuation, Tortuous, severe attenuation temporal periphery  Vascular attenuation, Tortuous, severe attenuation temporal perhiphery   Periphery Attached, operculated hole at 1000 with partial pigment and +cuff of SRF--good laser changes surrounding, scattered MA, focal DBH nasal to disc Attached, scattered IRH and CWS, White without pressure temporally, focal pigmented CR scar at 0130 equator, good peripheral 360 PRP          IMAGING AND PROCEDURES  Imaging and Procedures for  _0 @  OCT, Retina - OU - Both Eyes       Right Eye Quality was good. Central Foveal Thickness: 239. Progression has been stable. Findings include normal foveal contour, no SRF, intraretinal fluid (persistent trace cystic changes (non-central), mild diffuse retinal thinning--stable).   Left Eye Quality was good. Central Foveal Thickness:  252. Progression has worsened. Findings include normal foveal contour, intraretinal hyper-reflective material, no SRF, vitreomacular adhesion , intraretinal fluid (Mild interval increase in inferior IRF).   Notes *Images captured and stored on drive  Diagnosis / Impression:  OD: NFP, no SRF -- persistent trace cystic changes (non-central), mild diffuse retinal thinning  OS: NFP, no SRF/IRF, Mild interval increase in IRF    Clinical management:  See below  Abbreviations: NFP - Normal foveal profile. CME - cystoid macular edema. PED - pigment epithelial detachment. IRF - intraretinal fluid. SRF - subretinal fluid. EZ - ellipsoid zone. ERM - epiretinal membrane. ORA - outer retinal atrophy. ORT - outer retinal tubulation. SRHM - subretinal hyper-reflective material        Intravitreal Injection, Pharmacologic Agent - OS - Left Eye       Time Out 10/23/2020. 4:19 PM. Confirmed correct patient, procedure, site, and patient consented.   Anesthesia Topical anesthesia was used. Anesthetic medications included Lidocaine 2%, Proparacaine 0.5%.   Procedure Preparation included 5% betadine to ocular surface, eyelid speculum. A supplied needle was used.   Injection:  1.25 mg Bevacizumab (AVASTIN) SOLN   NDC: 34287-681-15, Lot: [id number] , Expiration date: 10/30/2020   Route: Intravitreal, Site: Left Eye, Waste: 0 mL  Post-op Post injection exam found visual acuity of at least counting fingers. The patient tolerated the procedure well. There were no complications. The patient received written and verbal post procedure care education. Post injection  medications were not given.                 ASSESSMENT/PLAN:    ICD-10-CM   1. Retinal hole of right eye  H33.321   2. Moderate nonproliferative diabetic retinopathy of both eyes with macular edema associated with type 2 diabetes mellitus (HCC)  B26.2035 Intravitreal Injection, Pharmacologic Agent - OS - Left Eye    Bevacizumab (AVASTIN) SOLN 1.25 mg  3. Retinal edema  H35.81 OCT, Retina - OU - Both Eyes  4. Essential hypertension  I10   5. Hypertensive retinopathy of both eyes  H35.033   6. Combined forms of age-related cataract of both eyes  H25.813   7. Dry eyes  H04.123     1. Operculated retinal hole w/ cuff of SRF / focal RD, right eye.    - operculated hole located at 1000 with partial pigment and +cuff of SRF / focal RD  - s/p retinopexy OD (03.17.21) -- good laser changes surrounding  - monitor  2,3. Moderate to severe non-proliferative diabetic retinopathy, OU  - s/p IVA OS #1 (05.07.21), #2 (06.04.21), #3 (07.06.21), #4 (08.03.21), #5 (08.31.21), #6 (9.28.21)  - s/p PRP OS (04.09.21) -- good laser changes  - repeat FA (09.28.21) shows late leaking MA OU, significant capillary drop-out   - exam shows scattered MA/IRH/CWS OU  - BCVA OD stable at 20/30; OS improved to 20/20  - OCT shows OD: persistent trace cystic changes (non-central); mild diffuse retinal thinning OS: interval increase in inferior IRF   - recommend IVA OS #7 today, 11.2.21 for +DME  - pt wishes to proceed with IVA OS  - RBA of procedure discussed, questions answered  - informed consent obtained, signed and scanned, 05.07.21  - see procedure note  - f/u 4-5 weeks -- DFE/OCT  4,5. Hypertensive retinopathy OU  - discussed importance of tight BP control  - monitor  6. Mixed form age related cataract OU  - The symptoms of cataract, surgical options, and treatments and risks were discussed with patient.  -  discussed diagnosis and progression  - under the expert management of Dr. Zenia Resides  - now  that macular edema is improved and relatively stable, pt is clear from a retina standpoint to proceed with cataract surgery when pt and surgeon are ready  - will send back to Dr. Zenia Resides for cataract f/u / consult  7. Dry eyes OU  - recommend artificial tears and lubricating ointment as needed  Ophthalmic Meds Ordered this visit:  Meds ordered this encounter  Medications  . Bevacizumab (AVASTIN) SOLN 1.25 mg      Return in about 4 weeks (around 11/20/2020) for 4-5 weeks DFE, OCT.  There are no Patient Instructions on file for this visit.   Explained the diagnoses, plan, and follow up with the patient and they expressed understanding.  Patient expressed understanding of the importance of proper follow up care.   This document serves as a record of services personally performed by Gardiner Sleeper, MD, PhD. It was created on their behalf by Estill Bakes, COT an ophthalmic technician. The creation of this record is the provider's dictation and/or activities during the visit.    Electronically signed by: Estill Bakes, COT 10.29.21 @ 10:14 PM  Gardiner Sleeper, M.D., Ph.D. Diseases & Surgery of the Retina and [place] 10/23/2020   I have reviewed the above documentation for accuracy and completeness, and I agree with the above. Gardiner Sleeper, M.D., Ph.D. 10/23/20 10:14 PM   Abbreviations: M myopia (nearsighted); A astigmatism; H hyperopia (farsighted); P presbyopia; Mrx spectacle prescription;  CTL contact lenses; OD right eye; OS left eye; OU both eyes  XT exotropia; ET esotropia; PEK punctate epithelial keratitis; PEE punctate epithelial erosions; DES dry eye syndrome; MGD meibomian gland dysfunction; ATs artificial tears; PFAT's preservative free artificial tears; [place] nuclear sclerotic cataract; PSC posterior subcapsular cataract; ERM epi-retinal membrane; PVD posterior vitreous detachment; RD retinal detachment; DM diabetes mellitus; DR diabetic  retinopathy; NPDR non-proliferative diabetic retinopathy; PDR proliferative diabetic retinopathy; CSME clinically significant macular edema; DME diabetic macular edema; dbh dot blot hemorrhages; CWS cotton wool spot; POAG primary open angle glaucoma; C/D cup-to-disc ratio; HVF humphrey visual field; GVF goldmann visual field; OCT optical coherence tomography; IOP intraocular pressure; BRVO Branch retinal vein occlusion; CRVO central retinal vein occlusion; CRAO central retinal artery occlusion; BRAO branch retinal artery occlusion; RT retinal tear; SB scleral buckle; PPV pars plana vitrectomy; VH Vitreous hemorrhage; PRP panretinal laser photocoagulation; IVK intravitreal kenalog; VMT vitreomacular traction; MH Macular hole;  NVD neovascularization of the disc; NVE neovascularization elsewhere; AREDS age related eye disease study; ARMD age related macular degeneration; POAG primary open angle glaucoma; EBMD epithelial/anterior basement membrane dystrophy; ACIOL anterior chamber intraocular lens; IOL intraocular lens; PCIOL posterior chamber intraocular lens; Phaco/IOL phacoemulsification with intraocular lens placement; [place] photorefractive keratectomy; LASIK laser assisted in situ keratomileusis; HTN hypertension; DM diabetes mellitus; COPD chronic obstructive pulmonary disease

## 2020-10-20 LAB — CUP PACEART REMOTE DEVICE CHECK
Date Time Interrogation Session: 20211025230530
Implantable Pulse Generator Implant Date: 20210201

## 2020-10-22 ENCOUNTER — Ambulatory Visit (INDEPENDENT_AMBULATORY_CARE_PROVIDER_SITE_OTHER): Payer: PPO

## 2020-10-22 DIAGNOSIS — I639 Cerebral infarction, unspecified: Secondary | ICD-10-CM | POA: Diagnosis not present

## 2020-10-23 ENCOUNTER — Encounter (INDEPENDENT_AMBULATORY_CARE_PROVIDER_SITE_OTHER): Payer: Self-pay | Admitting: Ophthalmology

## 2020-10-23 ENCOUNTER — Ambulatory Visit (INDEPENDENT_AMBULATORY_CARE_PROVIDER_SITE_OTHER): Payer: PPO | Admitting: Ophthalmology

## 2020-10-23 ENCOUNTER — Other Ambulatory Visit: Payer: Self-pay

## 2020-10-23 DIAGNOSIS — H33321 Round hole, right eye: Secondary | ICD-10-CM

## 2020-10-23 DIAGNOSIS — H3581 Retinal edema: Secondary | ICD-10-CM

## 2020-10-23 DIAGNOSIS — H04123 Dry eye syndrome of bilateral lacrimal glands: Secondary | ICD-10-CM

## 2020-10-23 DIAGNOSIS — H35033 Hypertensive retinopathy, bilateral: Secondary | ICD-10-CM

## 2020-10-23 DIAGNOSIS — H25813 Combined forms of age-related cataract, bilateral: Secondary | ICD-10-CM

## 2020-10-23 DIAGNOSIS — I10 Essential (primary) hypertension: Secondary | ICD-10-CM | POA: Diagnosis not present

## 2020-10-23 DIAGNOSIS — E113313 Type 2 diabetes mellitus with moderate nonproliferative diabetic retinopathy with macular edema, bilateral: Secondary | ICD-10-CM | POA: Diagnosis not present

## 2020-10-23 MED ORDER — BEVACIZUMAB CHEMO INJECTION 1.25MG/0.05ML SYRINGE FOR KALEIDOSCOPE
1.2500 mg | INTRAVITREAL | Status: AC | PRN
  Administered 2020-10-23: 1.25 mg via INTRAVITREAL

## 2020-10-24 NOTE — Progress Notes (Signed)
[facility] Summary Report / Loop Recorder 

## 2020-10-26 DIAGNOSIS — R011 Cardiac murmur, unspecified: Secondary | ICD-10-CM | POA: Diagnosis not present

## 2020-10-26 DIAGNOSIS — I5032 Chronic diastolic (congestive) heart failure: Secondary | ICD-10-CM | POA: Diagnosis not present

## 2020-10-26 DIAGNOSIS — Z008 Encounter for other general examination: Secondary | ICD-10-CM | POA: Diagnosis not present

## 2020-10-26 DIAGNOSIS — H25813 Combined forms of age-related cataract, bilateral: Secondary | ICD-10-CM | POA: Diagnosis not present

## 2020-10-26 DIAGNOSIS — I110 Hypertensive heart disease with heart failure: Secondary | ICD-10-CM | POA: Diagnosis not present

## 2020-10-26 DIAGNOSIS — Z6828 Body mass index (BMI) 28.0-28.9, adult: Secondary | ICD-10-CM | POA: Diagnosis not present

## 2020-10-26 DIAGNOSIS — E663 Overweight: Secondary | ICD-10-CM | POA: Diagnosis not present

## 2020-10-26 DIAGNOSIS — E113313 Type 2 diabetes mellitus with moderate nonproliferative diabetic retinopathy with macular edema, bilateral: Secondary | ICD-10-CM | POA: Diagnosis not present

## 2020-11-16 ENCOUNTER — Other Ambulatory Visit: Payer: Self-pay

## 2020-11-16 ENCOUNTER — Encounter (HOSPITAL_COMMUNITY): Payer: Self-pay | Admitting: Emergency Medicine

## 2020-11-16 ENCOUNTER — Emergency Department (HOSPITAL_COMMUNITY): Payer: PPO

## 2020-11-16 ENCOUNTER — Emergency Department (HOSPITAL_COMMUNITY)
Admission: EM | Admit: 2020-11-16 | Discharge: 2020-11-16 | Disposition: A | Discharge status: Home / Self Care | Payer: PPO | Attending: Emergency Medicine | Admitting: Emergency Medicine

## 2020-11-16 DIAGNOSIS — M25562 Pain in left knee: Secondary | ICD-10-CM | POA: Diagnosis not present

## 2020-11-16 DIAGNOSIS — M25462 Effusion, left knee: Secondary | ICD-10-CM | POA: Diagnosis not present

## 2020-11-16 DIAGNOSIS — M25461 Effusion, right knee: Secondary | ICD-10-CM | POA: Diagnosis not present

## 2020-11-16 DIAGNOSIS — Z23 Encounter for immunization: Secondary | ICD-10-CM | POA: Diagnosis not present

## 2020-11-16 DIAGNOSIS — W010XXA Fall on same level from slipping, tripping and stumbling without subsequent striking against object, initial encounter: Secondary | ICD-10-CM | POA: Insufficient documentation

## 2020-11-16 DIAGNOSIS — E119 Type 2 diabetes mellitus without complications: Secondary | ICD-10-CM | POA: Insufficient documentation

## 2020-11-16 DIAGNOSIS — I10 Essential (primary) hypertension: Secondary | ICD-10-CM | POA: Diagnosis not present

## 2020-11-16 DIAGNOSIS — Z79899 Other long term (current) drug therapy: Secondary | ICD-10-CM | POA: Diagnosis not present

## 2020-11-16 DIAGNOSIS — M7989 Other specified soft tissue disorders: Secondary | ICD-10-CM | POA: Diagnosis not present

## 2020-11-16 DIAGNOSIS — Z7982 Long term (current) use of aspirin: Secondary | ICD-10-CM | POA: Insufficient documentation

## 2020-11-16 DIAGNOSIS — S0083XA Contusion of other part of head, initial encounter: Secondary | ICD-10-CM | POA: Insufficient documentation

## 2020-11-16 DIAGNOSIS — M25561 Pain in right knee: Secondary | ICD-10-CM | POA: Diagnosis not present

## 2020-11-16 DIAGNOSIS — Z7984 Long term (current) use of oral hypoglycemic drugs: Secondary | ICD-10-CM | POA: Insufficient documentation

## 2020-11-16 DIAGNOSIS — S0990XA Unspecified injury of head, initial encounter: Secondary | ICD-10-CM | POA: Diagnosis not present

## 2020-11-16 DIAGNOSIS — I6529 Occlusion and stenosis of unspecified carotid artery: Secondary | ICD-10-CM | POA: Diagnosis not present

## 2020-11-16 DIAGNOSIS — S80211A Abrasion, right knee, initial encounter: Secondary | ICD-10-CM | POA: Diagnosis not present

## 2020-11-16 DIAGNOSIS — I6782 Cerebral ischemia: Secondary | ICD-10-CM | POA: Diagnosis not present

## 2020-11-16 DIAGNOSIS — W19XXXA Unspecified fall, initial encounter: Secondary | ICD-10-CM

## 2020-11-16 MED ORDER — TETRACAINE HCL 0.5 % OP SOLN
2.0000 [drp] | Freq: Once | OPHTHALMIC | Status: AC
  Administered 2020-11-16: 2 [drp] via OPHTHALMIC
  Filled 2020-11-16: qty 4

## 2020-11-16 MED ORDER — BACITRACIN ZINC 500 UNIT/GM EX OINT
TOPICAL_OINTMENT | Freq: Two times a day (BID) | CUTANEOUS | Status: DC
  Administered 2020-11-16: 1 via TOPICAL
  Filled 2020-11-16: qty 0.9

## 2020-11-16 MED ORDER — FLUORESCEIN SODIUM 1 MG OP STRP
1.0000 | ORAL_STRIP | Freq: Once | OPHTHALMIC | Status: AC
  Administered 2020-11-16: 1 via OPHTHALMIC
  Filled 2020-11-16: qty 1

## 2020-11-16 MED ORDER — TETANUS-DIPHTH-ACELL PERTUSSIS 5-2.5-18.5 LF-MCG/0.5 IM SUSY
0.5000 mL | PREFILLED_SYRINGE | Freq: Once | INTRAMUSCULAR | Status: AC
  Administered 2020-11-16: 0.5 mL via INTRAMUSCULAR
  Filled 2020-11-16: qty 0.5

## 2020-11-16 NOTE — ED Triage Notes (Signed)
Pt fell on sidewalk today. Complaint of knot on head, and knee pain. Pt is not on blood thinners.

## 2020-11-16 NOTE — ED Notes (Signed)
Pt transported to xray 

## 2020-11-16 NOTE — ED Provider Notes (Signed)
[facility] Provider Note   CSN: [id number] Arrival date & time: 11/16/20  1424     History Chief Complaint  Patient presents with  . Fall    Rilan Eiland is a 69 y.o. male with PMH of HTN, HLD, type II DM, and CVA who presents to the ED after sustaining a fall on the sidewalk.    Patient reports that he was going out for his daily walk when he tripped over the curb of a bank and fell forward striking his face and head on the sidewalk in addition to his knees bilaterally.  He reports that he did not lose consciousness and was able to get up and ambulate back to his home.  His daughter then met him there and brought him to the ED for evaluation.  She reports that he had a stroke earlier this year and was initially placed on Plavix, but is now only taking 81 mg aspirin.  Patient is resting comfortably on my examination and is denying any significant pain symptoms.  However, his daughter does note that he is not a complainer and did not complain of any symptoms when he had his prior stroke.  She is concerned given his head injury.  Patient has abrasions to his nose and a hematoma on his forehead.  He also has abrasions to his knees bilaterally with mild associated swelling.  He denies any headache, room spinning dizziness, blurred vision, foreign body sensation in eye, pain with EOMs, neck pain, chest pain, arm or wrist pain, abdominal pain, nausea or vomiting, confusion, numbness or weakness, or other focal neurologic deficits.  Patient has an appointment with his ophthalmologist 11/22/2020.  HPI     Past Medical History:  Diagnosis Date  . Acute cerebrovascular accident (CVA) ([facility]) 01/20/2020  . Benign essential HTN 01/20/2020  . Cataract    Mixed form OU  . Diabetic retinopathy ([place])    NPDR OU  . Hypertensive retinopathy    OU  . Seizures ([place])    per pt- "none since teenage years"    Patient Active Problem List   Diagnosis Date Noted    . Acute cerebrovascular accident (CVA) ([place]) 01/20/2020  . Benign essential HTN 01/20/2020    Past Surgical History:  Procedure Laterality Date  . LOOP RECORDER INSERTION N/A 01/23/2020   Procedure: LOOP RECORDER INSERTION;  Surgeon: Deboraha Sprang, MD;  Location: [facility];  Service: Cardiovascular;  Laterality: N/A;       Family History  Problem Relation Age of Onset  . COPD Mother   . Diabetes Mother   . Dementia Father   . Diabetes Father   . Cancer Father        prostate and lung  . Colon cancer Neg Hx   . Esophageal cancer Neg Hx   . Stomach cancer Neg Hx   . Rectal cancer Neg Hx     Social History   Tobacco Use  . Smoking status: Never Smoker  . Smokeless tobacco: Never Used  Vaping Use  . Vaping Use: Never used  Substance Use Topics  . Alcohol use: Never  . Drug use: Never    Home Medications Prior to Admission medications   Medication Sig Start Date End Date Taking? Authorizing Provider  aspirin EC 325 MG EC tablet Take 1 tablet (325 mg total) by mouth daily. 01/24/20  Yes Dessa Phi, DO  atorvastatin (LIPITOR) 40 MG tablet Take 1 tablet (40 mg total) by mouth  daily at 6 PM. 01/23/20  Yes Dessa Phi, DO  carvedilol (COREG) 6.25 MG tablet Take 6.25 mg by mouth 2 (two) times daily with a meal.   Yes [provider]  hydrochlorothiazide (HYDRODIURIL) 25 MG tablet Take 25 mg by mouth daily. 03/18/20  Yes [provider]  losartan (COZAAR) 100 MG tablet Take 100 mg by mouth daily. 11/10/20  Yes [provider]  metFORMIN (GLUCOPHAGE) 500 MG tablet Take by mouth 2 (two) times daily with a meal.   Yes [provider]  Multiple Vitamins-Minerals (ONE-A-DAY MENS 50+ PO) Take 1 tablet by mouth daily.   Yes [provider]  Polyvinyl Alcohol-Povidone (REFRESH OP) Place 1 drop into both eyes in the morning and at bedtime.   Yes [provider]  carvedilol (COREG) 3.125 MG tablet Take 1 tablet (3.125 mg  total) by mouth 2 (two) times daily. 02/20/20 05/20/20  Shirley Friar, PA-C  clopidogrel (PLAVIX) 75 MG tablet Take 75 mg by mouth daily. 07/18/20   [provider]  losartan (COZAAR) 50 MG tablet Take 1 tablet (50 mg total) by mouth daily. Patient not taking: Reported on 11/16/2020 03/15/20   Fay Records, MD  metFORMIN (GLUCOPHAGE) 500 MG tablet Take 1 tablet (500 mg total) by mouth 2 (two) times daily with a meal. 01/23/20 04/22/20  Dessa Phi, DO    Allergies    Shellfish allergy  Review of Systems   Review of Systems  All other systems reviewed and are negative.   Physical Exam Updated Vital Signs BP (!) 191/88 (BP Location: Right Arm)   Pulse 88   Temp 98.6 F (37 C) (Oral)   Resp 16   Ht 5' 7"  (1.702 m)   Wt 85.3 kg   SpO2 99%   BMI 29.44 kg/m   Physical Exam Vitals and nursing note reviewed. Exam conducted with a chaperone present.  HENT:     Head: Normocephalic.     Comments: No palpable skull defects.  No injury to the top or back of the head.  Mild hematoma over her frontal bone.  Abrasion noted to nose.  No significant tenderness over the nose.  Conjunctival injection in left eye.  No pain with EOMs.  No periorbital edema or swelling.    Nose:     Comments: Patent in the nares bilaterally.  No septal hematoma.    Mouth/Throat:     Comments: No trismus or difficulty opening or closing mouth.  No dental injury. Eyes:     General: No scleral icterus.    Extraocular Movements: Extraocular movements intact.     Pupils: Pupils are equal, round, and reactive to light.     Comments: Conjunctival injection left eye.  No pain with EOMs.  No periorbital swelling or edema.  Neck:     Comments: No midline cervical tenderness.  ROM intact. Cardiovascular:     Rate and Rhythm: Normal rate.     Pulses: Normal pulses.  Pulmonary:     Effort: Pulmonary effort is normal.  Musculoskeletal:     Cervical back: Normal range of motion and neck supple. No rigidity.   Skin:    General: Skin is dry.     Capillary Refill: Capillary refill takes less than 2 seconds.  Neurological:     General: No focal deficit present.     Mental Status: He is alert and oriented to person, place, and time.     GCS: GCS eye subscore is 4. GCS verbal subscore  is 5. GCS motor subscore is 6.     Cranial Nerves: No cranial nerve deficit.     Sensory: No sensory deficit.     Motor: No weakness.     Coordination: Coordination normal.     Gait: Gait normal.     Comments: Negative Romberg and cerebellar exams.  Psychiatric:        Mood and Affect: Mood normal.        Behavior: Behavior normal.        Thought Content: Thought content normal.     ED Results / Procedures / Treatments   Labs (all labs ordered are listed, but only abnormal results are displayed) Labs Reviewed - No data to display  EKG None  Radiology CT Head Wo Contrast  Result Date: 11/16/2020 CLINICAL DATA:  Head trauma EXAM: CT HEAD WITHOUT CONTRAST TECHNIQUE: Contiguous axial images were obtained from the base of the skull through the vertex without intravenous contrast. COMPARISON:  MRI 01/21/2020, CT brain 01/20/2020 FINDINGS: Brain: Large chronic right MCA infarct with ex vacuo dilatation of right lateral ventricle. No acute territorial infarction, hemorrhage or intracranial mass. Mild chronic small vessel ischemic change of the white matter. Stable ventricle size. Vascular: Hyperdense vessels. Vertebral and carotid vascular calcification Skull: Normal. Negative for fracture or focal lesion. Sinuses/Orbits: No acute finding. Other: None IMPRESSION: 1. No CT evidence for acute intracranial abnormality. 2. Large chronic right MCA infarct. 3. Mild chronic small vessel ischemic change of the white matter. Electronically Signed   By: Donavan Foil M.D.   On: 11/16/2020 19:31   DG Knee Complete 4 Views Left  Result Date: 11/16/2020 CLINICAL DATA:  Fall with knee pain EXAM: LEFT KNEE - COMPLETE 4+ VIEW  COMPARISON:  None. FINDINGS: Small knee effusion. No definitive fracture or malalignment. Mild tricompartment arthritis. IMPRESSION: Small knee effusion. No definite acute osseous abnormality. Electronically Signed   By: Donavan Foil M.D.   On: 11/16/2020 19:26   DG Knee Complete 4 Views Right  Result Date: 11/16/2020 CLINICAL DATA:  Fall, abrasion EXAM: RIGHT KNEE - COMPLETE 4+ VIEW COMPARISON:  None. FINDINGS: No dislocation is evident. Small knee effusion. Mild tricompartment arthritis. Joint space calcification. Curvilinear calcification adjacent to the lateral tibial plateau indeterminate for avulsion fraction versus ligamentous calcification. Questionable subtle fracture deformity at the fibular head and proximal shaft. Generalized soft tissue swelling IMPRESSION: 1. Curvilinear calcification adjacent to the lateral tibial plateau indeterminate for avulsion fracture versus ligamentous calcification. 2. Questionable subtle fracture deformity at the fibular head and proximal shaft. 3. Small knee effusion. Electronically Signed   By: Donavan Foil M.D.   On: 11/16/2020 19:25    Procedures Procedures (including critical care time)  Medications Ordered in ED Medications  bacitracin ointment (has no administration in time range)  Tdap (BOOSTRIX) injection 0.5 mL (has no administration in time range)  fluorescein ophthalmic strip 1 strip (1 strip Left Eye Given by Other 11/16/20 2003)  tetracaine (PONTOCAINE) 0.5 % ophthalmic solution 2 drop (2 drops Left Eye Given 11/16/20 2003)    ED Course  I have reviewed the triage vital signs and the nursing notes.  Pertinent labs & imaging results that were available during my care of the patient were reviewed by me and considered in my medical decision making (see chart for details).    MDM Rules/Calculators/A&P                          We will update patient's  tetanus with Boostrix immunization.  Will irrigate his superficial wounds and apply  topical antibiotics and dressing.  Wound care instructions provided.  CT head without contrast is personally reviewed and demonstrates no evidence for acute abnormalities.  DG left knee complete is personally reviewed and shows small knee effusion, likely traumatic.  DG right knee complete demonstrates a curvilinear calcification adjacent to the lateral tibial plateau indeterminate for avulsion fracture versus ligamentous calcification.  There is also a questionable subtle fracture deformity at the fibular head and proximal shaft.  Offered patient crutches for his questionable fibular head fracture.  Do not feel as though further work-up or imaging is warranted.  Patient has been ambulatory since the fall.  Will refer to Dr. Doran Durand with [facility] for ongoing evaluation and management.  Eye exam here in the ED was reassuring.  No obvious foreign body, abrasion, ulceration, hyphema, or other obvious abnormalities.  No periorbital swelling or edema.  No pain with EOMs.  He can follow-up with his ophthalmologist.  Return to the ED or seek immediate medical attention should he experience any new or worsening symptoms.   Final Clinical Impression(s) / ED Diagnoses Final diagnoses:  Fall, initial encounter    Rx / DC Orders ED Discharge Orders    None       Reita Chard 11/16/20 2015    Noemi Chapel, MD 11/17/20 1511

## 2020-11-16 NOTE — Progress Notes (Signed)
Orthopedic Tech Progress Note Patient Details:  [name] Aug 09, 1951 [id number]  Ortho Devices Type of Ortho Device: Crutches Ortho Device/Splint Interventions: Adjustment, Ordered, Application       [name] 11/16/2020, 8:58 PM

## 2020-11-16 NOTE — Discharge Instructions (Signed)
Your work-up today was reassuring.  Your head CT was without any obvious abnormalities.  There was a questionable subtle fracture involving the fibular head on your right leg.  You have been provided crutches.  I recommend that you take it easy until you are evaluated by orthopedics.  I have referred you to Dr. Victorino Dike with [facility].  Please call them to schedule appointment.  Continue to keep your abrasions clean and covered.  I recommend that you apply a topical antibiotic to the knees bilaterally to help mitigate risk of infection.  Return to the ED or seek immediate medical attention should you experience any new or worsening symptoms.
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
## 2020-11-16 NOTE — ED Notes (Signed)
Bacitracin and clean dressing applied to bilateral knees and bridge of nose

## 2020-11-16 NOTE — ED Notes (Signed)
Patient transported to CT 

## 2020-11-20 ENCOUNTER — Other Ambulatory Visit: Payer: Self-pay

## 2020-11-20 ENCOUNTER — Telehealth: Payer: Self-pay | Admitting: Neurology

## 2020-11-20 ENCOUNTER — Ambulatory Visit: Payer: PPO | Admitting: Neurology

## 2020-11-20 ENCOUNTER — Encounter: Payer: Self-pay | Admitting: Neurology

## 2020-11-20 VITALS — BP 172/66 | HR 85 | Ht 67.0 in | Wt 199.4 lb

## 2020-11-20 DIAGNOSIS — I639 Cerebral infarction, unspecified: Secondary | ICD-10-CM | POA: Diagnosis not present

## 2020-11-20 DIAGNOSIS — H53462 Homonymous bilateral field defects, left side: Secondary | ICD-10-CM

## 2020-11-20 DIAGNOSIS — F0150 Vascular dementia without behavioral disturbance: Secondary | ICD-10-CM

## 2020-11-20 DIAGNOSIS — M25562 Pain in left knee: Secondary | ICD-10-CM | POA: Insufficient documentation

## 2020-11-20 DIAGNOSIS — R413 Other amnesia: Secondary | ICD-10-CM

## 2020-11-20 DIAGNOSIS — M170 Bilateral primary osteoarthritis of knee: Secondary | ICD-10-CM | POA: Diagnosis not present

## 2020-11-20 DIAGNOSIS — M25561 Pain in right knee: Secondary | ICD-10-CM | POA: Insufficient documentation

## 2020-11-20 DIAGNOSIS — M1711 Unilateral primary osteoarthritis, right knee: Secondary | ICD-10-CM | POA: Diagnosis not present

## 2020-11-20 MED ORDER — DONEPEZIL HCL 10 MG PO TABS: 10.0000 mg | ORAL_TABLET | Freq: Every day | ORAL | 2 refills | Status: DC

## 2020-11-20 NOTE — Telephone Encounter (Signed)
health team order sent to GI. No auth they will reach out to the patient to schedule.  
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
## 2020-11-20 NOTE — Patient Instructions (Signed)
I had a long discussion the patient and his daughter regarding his memory loss and cognitive impairment post stroke which likely represents mild vascular dementia.  I recommend lab work to look for reversible causes, EEG and MRI scan of the brain.  Trial of Aricept 5 mg daily for a month to be increased to 10 mg daily if tolerated without side effects.  I also encouraged him to participate in cognitively challenging activities like solving crossword puzzles, playing bridge and sodoku and we discussed memory compensation strategies.  Continue aspirin for stroke prevention with aggressive risk factor modification with strict control of hypertension with blood pressure goal below 130/90, lipids with LDL cholesterol goal below 70 mg percent and diabetes with hemoglobin A1c goal below 6.5%.  Also check lipid profile hemoglobin A1c today.  He was advised not to drive due to his peripheral vision loss and cognitive impairment.  He will return for follow-up in the future in 2 months or call earlier if necessary. Memory Compensation Strategies  1. Use "WARM" strategy.  W= write it down  A= associate it  R= repeat it  M= make a mental note  2.   You can keep a Glass blower/designer.  Use a 3-ring notebook with sections for the following: calendar, important names and phone numbers,  medications, doctors' names/phone numbers, lists/reminders, and a section to journal what you did  each day.   3.    Use a calendar to write appointments down.  4.    Write yourself a schedule for the day.  This can be placed on the calendar or in a separate section of the Memory Notebook.  Keeping a  regular schedule can help memory.  5.    Use medication organizer with sections for each day or morning/evening pills.  You may need help loading it  6.    Keep a basket, or pegboard by the door.  Place items that you need to take out with you in the basket or on the pegboard.  You may also want to  include a message board for  reminders.  7.    Use sticky notes.  Place sticky notes with reminders in a place where the task is performed.  For example: " turn off the  stove" placed by the stove, "lock the door" placed on the door at eye level, " take your medications" on  the bathroom mirror or by the place where you normally take your medications.  8.    Use alarms/timers.  Use while cooking to remind yourself to check on food or as a reminder to take your medicine, or as a  reminder to make a call, or as a reminder to perform another task, etc.

## 2020-11-21 LAB — LIPID PANEL
Chol/HDL Ratio: 2.3 ratio (ref 0.0–5.0)
Cholesterol, Total: 113 mg/dL (ref 100–199)
HDL: 49 mg/dL (ref >39)
LDL Chol Calc (NIH): 51 mg/dL (ref 0–99)
Triglycerides: 56 mg/dL (ref 0–149)
VLDL Cholesterol Cal: 13 mg/dL (ref 5–40)

## 2020-11-21 LAB — DEMENTIA PANEL
Homocysteine: 13.6 umol/L (ref 0.0–17.2)
RPR Ser Ql: NONREACTIVE
TSH: 1.91 u[IU]/mL (ref 0.450–4.500)
Vitamin B-12: 363 pg/mL (ref 232–1245)

## 2020-11-21 LAB — HEMOGLOBIN A1C
Est. average glucose Bld gHb Est-mCnc: 114 mg/dL
Hgb A1c MFr Bld: 5.6 % (ref 4.8–5.6)

## 2020-11-21 NOTE — Telephone Encounter (Signed)
Patient daughter [name] called me and informed me that he has a pacemaker. I informed her I would fax info to [facility] and if it is MRI safe they will reach out to her to schedule.

## 2020-11-22 ENCOUNTER — Other Ambulatory Visit: Payer: PPO

## 2020-11-22 ENCOUNTER — Ambulatory Visit: Payer: PPO | Admitting: Neurology

## 2020-11-22 DIAGNOSIS — R410 Disorientation, unspecified: Secondary | ICD-10-CM

## 2020-11-22 DIAGNOSIS — R413 Other amnesia: Secondary | ICD-10-CM

## 2020-11-22 NOTE — Telephone Encounter (Signed)
Patient is scheduled at [facility] for 11/28/20.
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
## 2020-11-23 ENCOUNTER — Encounter (HOSPITAL_COMMUNITY): Payer: Self-pay

## 2020-11-23 ENCOUNTER — Other Ambulatory Visit: Payer: Self-pay

## 2020-11-23 DIAGNOSIS — I10 Essential (primary) hypertension: Secondary | ICD-10-CM | POA: Diagnosis not present

## 2020-11-23 DIAGNOSIS — R600 Localized edema: Secondary | ICD-10-CM | POA: Diagnosis not present

## 2020-11-23 DIAGNOSIS — Z6830 Body mass index (BMI) 30.0-30.9, adult: Secondary | ICD-10-CM | POA: Diagnosis not present

## 2020-11-23 DIAGNOSIS — I5032 Chronic diastolic (congestive) heart failure: Secondary | ICD-10-CM | POA: Diagnosis not present

## 2020-11-23 DIAGNOSIS — M79652 Pain in left thigh: Secondary | ICD-10-CM | POA: Diagnosis not present

## 2020-11-23 DIAGNOSIS — Z7984 Long term (current) use of oral hypoglycemic drugs: Secondary | ICD-10-CM | POA: Diagnosis not present

## 2020-11-23 DIAGNOSIS — Z7982 Long term (current) use of aspirin: Secondary | ICD-10-CM | POA: Insufficient documentation

## 2020-11-23 DIAGNOSIS — M25462 Effusion, left knee: Secondary | ICD-10-CM | POA: Diagnosis not present

## 2020-11-23 DIAGNOSIS — R011 Cardiac murmur, unspecified: Secondary | ICD-10-CM | POA: Diagnosis not present

## 2020-11-23 DIAGNOSIS — R079 Chest pain, unspecified: Secondary | ICD-10-CM | POA: Diagnosis not present

## 2020-11-23 DIAGNOSIS — M7989 Other specified soft tissue disorders: Secondary | ICD-10-CM | POA: Diagnosis not present

## 2020-11-23 DIAGNOSIS — M79651 Pain in right thigh: Secondary | ICD-10-CM | POA: Diagnosis not present

## 2020-11-23 DIAGNOSIS — M25461 Effusion, right knee: Secondary | ICD-10-CM | POA: Diagnosis not present

## 2020-11-23 DIAGNOSIS — M79662 Pain in left lower leg: Secondary | ICD-10-CM | POA: Diagnosis not present

## 2020-11-23 DIAGNOSIS — Z79899 Other long term (current) drug therapy: Secondary | ICD-10-CM | POA: Insufficient documentation

## 2020-11-23 DIAGNOSIS — R609 Edema, unspecified: Secondary | ICD-10-CM | POA: Diagnosis not present

## 2020-11-23 DIAGNOSIS — I110 Hypertensive heart disease with heart failure: Secondary | ICD-10-CM | POA: Diagnosis not present

## 2020-11-23 DIAGNOSIS — E669 Obesity, unspecified: Secondary | ICD-10-CM | POA: Diagnosis not present

## 2020-11-23 DIAGNOSIS — Z008 Encounter for other general examination: Secondary | ICD-10-CM | POA: Diagnosis not present

## 2020-11-23 DIAGNOSIS — M1612 Unilateral primary osteoarthritis, left hip: Secondary | ICD-10-CM | POA: Diagnosis not present

## 2020-11-23 DIAGNOSIS — M1611 Unilateral primary osteoarthritis, right hip: Secondary | ICD-10-CM | POA: Diagnosis not present

## 2020-11-23 DIAGNOSIS — E11319 Type 2 diabetes mellitus with unspecified diabetic retinopathy without macular edema: Secondary | ICD-10-CM | POA: Insufficient documentation

## 2020-11-23 DIAGNOSIS — F0150 Vascular dementia without behavioral disturbance: Secondary | ICD-10-CM | POA: Diagnosis not present

## 2020-11-23 DIAGNOSIS — M79661 Pain in right lower leg: Secondary | ICD-10-CM | POA: Diagnosis not present

## 2020-11-23 LAB — CUP PACEART REMOTE DEVICE CHECK
Date Time Interrogation Session: 20211127230407
Implantable Pulse Generator Implant Date: 20210201
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
## 2020-11-23 NOTE — ED Notes (Signed)
Pt is ambulatory in lobby with walker.

## 2020-11-24 ENCOUNTER — Emergency Department (HOSPITAL_COMMUNITY): Payer: PPO

## 2020-11-24 ENCOUNTER — Emergency Department (HOSPITAL_COMMUNITY)
Admission: EM | Admit: 2020-11-24 | Discharge: 2020-11-24 | Disposition: A | Discharge status: Home / Self Care | Payer: PPO | Attending: Emergency Medicine | Admitting: Emergency Medicine

## 2020-11-24 ENCOUNTER — Inpatient Hospital Stay (HOSPITAL_COMMUNITY): Admission: RE | Admit: 2020-11-24 | Payer: PPO | Source: Ambulatory Visit

## 2020-11-24 DIAGNOSIS — M25462 Effusion, left knee: Secondary | ICD-10-CM | POA: Diagnosis not present

## 2020-11-24 DIAGNOSIS — R079 Chest pain, unspecified: Secondary | ICD-10-CM | POA: Diagnosis not present

## 2020-11-24 DIAGNOSIS — M7989 Other specified soft tissue disorders: Secondary | ICD-10-CM | POA: Diagnosis not present

## 2020-11-24 DIAGNOSIS — M79662 Pain in left lower leg: Secondary | ICD-10-CM | POA: Diagnosis not present

## 2020-11-24 DIAGNOSIS — M79651 Pain in right thigh: Secondary | ICD-10-CM | POA: Diagnosis not present

## 2020-11-24 DIAGNOSIS — M79661 Pain in right lower leg: Secondary | ICD-10-CM | POA: Diagnosis not present

## 2020-11-24 DIAGNOSIS — M79652 Pain in left thigh: Secondary | ICD-10-CM | POA: Diagnosis not present

## 2020-11-24 DIAGNOSIS — M1612 Unilateral primary osteoarthritis, left hip: Secondary | ICD-10-CM | POA: Diagnosis not present

## 2020-11-24 DIAGNOSIS — M25461 Effusion, right knee: Secondary | ICD-10-CM | POA: Diagnosis not present

## 2020-11-24 DIAGNOSIS — R609 Edema, unspecified: Secondary | ICD-10-CM

## 2020-11-24 DIAGNOSIS — M1611 Unilateral primary osteoarthritis, right hip: Secondary | ICD-10-CM | POA: Diagnosis not present

## 2020-11-24 LAB — CBC WITH DIFFERENTIAL/PLATELET
Abs Immature Granulocytes: 0.12 10*3/uL — ABNORMAL HIGH (ref 0.00–0.07)
Basophils Absolute: 0 10*3/uL (ref 0.0–0.1)
Basophils Relative: 0 %
Eosinophils Absolute: 0.4 10*3/uL (ref 0.0–0.5)
Eosinophils Relative: 6 %
HCT: 27.7 % — ABNORMAL LOW (ref 39.0–52.0)
Hemoglobin: 9.2 g/dL — ABNORMAL LOW (ref 13.0–17.0)
Immature Granulocytes: 2 %
Lymphocytes Relative: 22 %
Lymphs Abs: 1.5 10*3/uL (ref 0.7–4.0)
MCH: 30.5 pg (ref 26.0–34.0)
MCHC: 33.2 g/dL (ref 30.0–36.0)
MCV: 91.7 fL (ref 80.0–100.0)
Monocytes Absolute: 0.7 10*3/uL (ref 0.1–1.0)
Monocytes Relative: 10 %
Neutro Abs: 4.3 10*3/uL (ref 1.7–7.7)
Neutrophils Relative %: 60 %
Platelets: 340 10*3/uL (ref 150–400)
RBC: 3.02 MIL/uL — ABNORMAL LOW (ref 4.22–5.81)
RDW: 14.1 % (ref 11.5–15.5)
WBC: 7.1 10*3/uL (ref 4.0–10.5)
nRBC: 0 % (ref 0.0–0.2)

## 2020-11-24 LAB — BASIC METABOLIC PANEL
Anion gap: 9 (ref 5–15)
BUN: 20 mg/dL (ref 8–23)
CO2: 28 mmol/L (ref 22–32)
Calcium: 8.7 mg/dL — ABNORMAL LOW (ref 8.9–10.3)
Chloride: 102 mmol/L (ref 98–111)
Creatinine, Ser: 1.04 mg/dL (ref 0.61–1.24)
GFR, Estimated: >60 mL/min (ref >60)
Glucose, Bld: 123 mg/dL — ABNORMAL HIGH (ref 70–99)
Potassium: 3.8 mmol/L (ref 3.5–5.1)
Sodium: 139 mmol/L (ref 135–145)

## 2020-11-24 NOTE — ED Notes (Signed)
Waiting on pt to return from scans in order to draw labs.
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
## 2020-11-25 ENCOUNTER — Other Ambulatory Visit (HOSPITAL_COMMUNITY): Payer: Self-pay | Admitting: Emergency Medicine

## 2020-11-25 ENCOUNTER — Emergency Department (HOSPITAL_BASED_OUTPATIENT_CLINIC_OR_DEPARTMENT_OTHER)
Admission: RE | Admit: 2020-11-25 | Discharge: 2020-11-25 | Disposition: A | Discharge status: Home / Self Care | Payer: PPO | Source: Ambulatory Visit | Attending: Emergency Medicine | Admitting: Emergency Medicine

## 2020-11-25 DIAGNOSIS — R609 Edema, unspecified: Secondary | ICD-10-CM

## 2020-11-25 NOTE — Progress Notes (Signed)
Kindly inform the patient that lab work for reversible causes of memory loss, cholesterol profile and screening test for diabetes were all normal

## 2020-11-25 NOTE — Progress Notes (Signed)
Kindly inform the patient that EEG study was normal

## 2020-11-25 NOTE — Progress Notes (Signed)
VASCULAR LAB    Bilateral lower extremity venous duplex has been performed.  See CV proc for preliminary results.   Calea Hribar, RVT 11/25/2020, 12:26 PM

## 2020-11-26 ENCOUNTER — Ambulatory Visit (INDEPENDENT_AMBULATORY_CARE_PROVIDER_SITE_OTHER): Payer: PPO

## 2020-11-26 ENCOUNTER — Encounter: Payer: Self-pay | Admitting: *Deleted

## 2020-11-26 DIAGNOSIS — I639 Cerebral infarction, unspecified: Secondary | ICD-10-CM

## 2020-11-27 ENCOUNTER — Ambulatory Visit (INDEPENDENT_AMBULATORY_CARE_PROVIDER_SITE_OTHER): Payer: PPO | Admitting: Ophthalmology

## 2020-11-27 ENCOUNTER — Other Ambulatory Visit: Payer: Self-pay

## 2020-11-27 ENCOUNTER — Encounter (INDEPENDENT_AMBULATORY_CARE_PROVIDER_SITE_OTHER): Payer: Self-pay | Admitting: Ophthalmology

## 2020-11-27 DIAGNOSIS — H35033 Hypertensive retinopathy, bilateral: Secondary | ICD-10-CM

## 2020-11-27 DIAGNOSIS — I10 Essential (primary) hypertension: Secondary | ICD-10-CM | POA: Diagnosis not present

## 2020-11-27 DIAGNOSIS — H04123 Dry eye syndrome of bilateral lacrimal glands: Secondary | ICD-10-CM | POA: Diagnosis not present

## 2020-11-27 DIAGNOSIS — E113313 Type 2 diabetes mellitus with moderate nonproliferative diabetic retinopathy with macular edema, bilateral: Secondary | ICD-10-CM

## 2020-11-27 DIAGNOSIS — H25813 Combined forms of age-related cataract, bilateral: Secondary | ICD-10-CM

## 2020-11-27 DIAGNOSIS — H3581 Retinal edema: Secondary | ICD-10-CM | POA: Diagnosis not present

## 2020-11-27 DIAGNOSIS — H33321 Round hole, right eye: Secondary | ICD-10-CM | POA: Diagnosis not present

## 2020-11-27 MED ORDER — BEVACIZUMAB CHEMO INJECTION 1.25MG/0.05ML SYRINGE FOR KALEIDOSCOPE
1.2500 mg | INTRAVITREAL | Status: AC | PRN
  Administered 2020-11-27: 1.25 mg via INTRAVITREAL

## 2020-11-28 ENCOUNTER — Ambulatory Visit (HOSPITAL_COMMUNITY)
Admission: RE | Admit: 2020-11-28 | Discharge: 2020-11-28 | Disposition: A | Discharge status: Home / Self Care | Payer: PPO | Source: Ambulatory Visit | Attending: Neurology | Admitting: Neurology

## 2020-11-28 DIAGNOSIS — R2681 Unsteadiness on feet: Secondary | ICD-10-CM | POA: Diagnosis not present

## 2020-11-28 DIAGNOSIS — Z9181 History of falling: Secondary | ICD-10-CM | POA: Diagnosis not present

## 2020-11-28 DIAGNOSIS — R531 Weakness: Secondary | ICD-10-CM | POA: Diagnosis not present

## 2020-11-28 DIAGNOSIS — S065X0A Traumatic subdural hemorrhage without loss of consciousness, initial encounter: Secondary | ICD-10-CM | POA: Diagnosis not present

## 2020-11-28 DIAGNOSIS — R413 Other amnesia: Secondary | ICD-10-CM | POA: Insufficient documentation

## 2020-11-28 DIAGNOSIS — R269 Unspecified abnormalities of gait and mobility: Secondary | ICD-10-CM | POA: Diagnosis not present

## 2020-11-28 DIAGNOSIS — M6281 Muscle weakness (generalized): Secondary | ICD-10-CM | POA: Diagnosis not present

## 2020-11-28 MED ORDER — GADOBUTROL 1 MMOL/ML IV SOLN
9.0000 mL | Freq: Once | INTRAVENOUS | Status: AC | PRN
  Administered 2020-11-28: 9 mL via INTRAVENOUS

## 2020-11-29 ENCOUNTER — Telehealth: Payer: Self-pay | Admitting: Neurology

## 2020-11-29 ENCOUNTER — Other Ambulatory Visit: Payer: Self-pay | Admitting: Neurology

## 2020-11-29 DIAGNOSIS — I6300 Cerebral infarction due to thrombosis of unspecified precerebral artery: Secondary | ICD-10-CM

## 2020-11-29 NOTE — Telephone Encounter (Signed)
[name] Nicholes Rough. returned the call. Please call back at [phone] when available.

## 2020-11-29 NOTE — Telephone Encounter (Signed)
[facility] and spoke to Diane.  MRI head study for patient showing positive for a Trace right side subdural hematoma, most apparent along the floor of the posterior and middle cranial fossa (series 5, images 7 and 9), up to 2 mm in thickness.  Superimposed encephalomalacia throughout much of the right temporal lobe, posterior right MCA and right MCA/PCA watershed area corresponding to the large January infarct. Mesial right temporal lobe relatively spared. Associated hemosiderin, laminar necrosis.  Chronic lacunar infarct with hemosiderin also at the dorsal right thalamus. Chronic micro-hemorrhage in the left deep cerebellar nuclei. Trace possible superficial siderosis over the right middle frontal gyrus, superior left parietal lobe.  No restricted diffusion to suggest acute infarction. No midline shift, mass effect, evidence of mass lesion, ventriculomegaly, intraventricular blood. Cervicomedullary junction and pituitary are within normal limits.  Message sent high priority to [name]

## 2020-11-29 NOTE — Telephone Encounter (Signed)
Called daughter back [name]) and relayed Dr. Marlis Edelson orders for stopping aspirin and plavix.  She stated he wasn't taking the plavix anyway.  [name] was able to return orders and verbalize understanding.  Aware he will need a repeat CT in 1 week.  Denied further questions and expressed appreciation.

## 2020-11-29 NOTE — Telephone Encounter (Signed)
Diane from [facility] called needing to give RN the Call Report for the pt. Diane would like a call back as soon as possible. She states that whomever picks up can give the Call Report.

## 2020-11-29 NOTE — Telephone Encounter (Signed)
I have reviewed mri which sho0ws small sundural and hemorrhagic transformation into his old stroke. I tried calling pt and left message to call office. Let him know I want him to stop aspirin and plavix now and we will repeat Ct head in 1 week to see if blood is subsided.

## 2020-11-29 NOTE — Telephone Encounter (Signed)
thanks

## 2020-12-03 DIAGNOSIS — H25813 Combined forms of age-related cataract, bilateral: Secondary | ICD-10-CM | POA: Diagnosis not present

## 2020-12-03 DIAGNOSIS — H53462 Homonymous bilateral field defects, left side: Secondary | ICD-10-CM | POA: Diagnosis not present

## 2020-12-03 DIAGNOSIS — H35033 Hypertensive retinopathy, bilateral: Secondary | ICD-10-CM | POA: Diagnosis not present

## 2020-12-03 DIAGNOSIS — E11319 Type 2 diabetes mellitus with unspecified diabetic retinopathy without macular edema: Secondary | ICD-10-CM | POA: Diagnosis not present

## 2020-12-03 DIAGNOSIS — H40023 Open angle with borderline findings, high risk, bilateral: Secondary | ICD-10-CM | POA: Diagnosis not present

## 2020-12-05 NOTE — Progress Notes (Signed)
[facility] Summary Report / Loop Recorder 

## 2020-12-10 DIAGNOSIS — Z8673 Personal history of transient ischemic attack (TIA), and cerebral infarction without residual deficits: Secondary | ICD-10-CM | POA: Diagnosis not present

## 2020-12-10 DIAGNOSIS — Z23 Encounter for immunization: Secondary | ICD-10-CM | POA: Diagnosis not present

## 2020-12-10 DIAGNOSIS — R011 Cardiac murmur, unspecified: Secondary | ICD-10-CM | POA: Diagnosis not present

## 2020-12-10 DIAGNOSIS — E669 Obesity, unspecified: Secondary | ICD-10-CM | POA: Diagnosis not present

## 2020-12-10 DIAGNOSIS — Z6831 Body mass index (BMI) 31.0-31.9, adult: Secondary | ICD-10-CM | POA: Diagnosis not present

## 2020-12-10 DIAGNOSIS — I5032 Chronic diastolic (congestive) heart failure: Secondary | ICD-10-CM | POA: Diagnosis not present

## 2020-12-10 DIAGNOSIS — I110 Hypertensive heart disease with heart failure: Secondary | ICD-10-CM | POA: Diagnosis not present

## 2020-12-10 DIAGNOSIS — H25813 Combined forms of age-related cataract, bilateral: Secondary | ICD-10-CM | POA: Diagnosis not present

## 2020-12-11 DIAGNOSIS — M25561 Pain in right knee: Secondary | ICD-10-CM | POA: Diagnosis not present

## 2020-12-11 DIAGNOSIS — M25562 Pain in left knee: Secondary | ICD-10-CM | POA: Diagnosis not present

## 2020-12-19 ENCOUNTER — Ambulatory Visit
Admission: RE | Admit: 2020-12-19 | Discharge: 2020-12-19 | Disposition: A | Discharge status: Home / Self Care | Payer: PPO | Source: Ambulatory Visit | Attending: Student | Admitting: Student

## 2020-12-19 ENCOUNTER — Other Ambulatory Visit: Payer: Self-pay | Admitting: Student

## 2020-12-19 DIAGNOSIS — Z008 Encounter for other general examination: Secondary | ICD-10-CM | POA: Diagnosis not present

## 2020-12-19 DIAGNOSIS — Z79899 Other long term (current) drug therapy: Secondary | ICD-10-CM | POA: Diagnosis not present

## 2020-12-19 DIAGNOSIS — R062 Wheezing: Secondary | ICD-10-CM

## 2020-12-19 DIAGNOSIS — R5383 Other fatigue: Secondary | ICD-10-CM | POA: Diagnosis not present

## 2020-12-19 DIAGNOSIS — Z6828 Body mass index (BMI) 28.0-28.9, adult: Secondary | ICD-10-CM | POA: Diagnosis not present

## 2020-12-19 DIAGNOSIS — R197 Diarrhea, unspecified: Secondary | ICD-10-CM | POA: Diagnosis not present

## 2020-12-19 DIAGNOSIS — Z0000 Encounter for general adult medical examination without abnormal findings: Secondary | ICD-10-CM | POA: Diagnosis not present

## 2020-12-19 DIAGNOSIS — R059 Cough, unspecified: Secondary | ICD-10-CM

## 2020-12-19 DIAGNOSIS — I5032 Chronic diastolic (congestive) heart failure: Secondary | ICD-10-CM | POA: Diagnosis not present

## 2020-12-19 DIAGNOSIS — I110 Hypertensive heart disease with heart failure: Secondary | ICD-10-CM | POA: Diagnosis not present

## 2020-12-19 DIAGNOSIS — E663 Overweight: Secondary | ICD-10-CM | POA: Diagnosis not present

## 2020-12-25 ENCOUNTER — Telehealth: Payer: Self-pay | Admitting: Neurology

## 2020-12-25 DIAGNOSIS — I110 Hypertensive heart disease with heart failure: Secondary | ICD-10-CM | POA: Diagnosis not present

## 2020-12-25 DIAGNOSIS — I771 Stricture of artery: Secondary | ICD-10-CM | POA: Diagnosis not present

## 2020-12-25 DIAGNOSIS — E1151 Type 2 diabetes mellitus with diabetic peripheral angiopathy without gangrene: Secondary | ICD-10-CM | POA: Diagnosis not present

## 2020-12-25 DIAGNOSIS — I70203 Unspecified atherosclerosis of native arteries of extremities, bilateral legs: Secondary | ICD-10-CM | POA: Diagnosis not present

## 2020-12-25 DIAGNOSIS — F0150 Vascular dementia without behavioral disturbance: Secondary | ICD-10-CM | POA: Diagnosis not present

## 2020-12-25 DIAGNOSIS — R5383 Other fatigue: Secondary | ICD-10-CM | POA: Diagnosis not present

## 2020-12-25 DIAGNOSIS — K5900 Constipation, unspecified: Secondary | ICD-10-CM | POA: Diagnosis not present

## 2020-12-25 DIAGNOSIS — E663 Overweight: Secondary | ICD-10-CM | POA: Diagnosis not present

## 2020-12-25 DIAGNOSIS — R197 Diarrhea, unspecified: Secondary | ICD-10-CM | POA: Diagnosis not present

## 2020-12-25 DIAGNOSIS — R5381 Other malaise: Secondary | ICD-10-CM | POA: Diagnosis not present

## 2020-12-25 DIAGNOSIS — E860 Dehydration: Secondary | ICD-10-CM | POA: Diagnosis not present

## 2020-12-25 DIAGNOSIS — I5032 Chronic diastolic (congestive) heart failure: Secondary | ICD-10-CM | POA: Diagnosis not present

## 2020-12-25 NOTE — Telephone Encounter (Signed)
Patient's daughter called to check status on CT . Order was not released . Relayed to Daughter patient will go to [facility] . Not sure if authorization needs to be checked first . Irving Burton is just getting back to the office from vacation . Scherry Ran  Patient's daughter was fine with this . [phone].

## 2020-12-25 NOTE — Telephone Encounter (Signed)
health team order sent to GI. No auth they will reach out to the patient to schedule.  

## 2020-12-31 ENCOUNTER — Ambulatory Visit (INDEPENDENT_AMBULATORY_CARE_PROVIDER_SITE_OTHER): Payer: PPO

## 2020-12-31 DIAGNOSIS — I639 Cerebral infarction, unspecified: Secondary | ICD-10-CM | POA: Diagnosis not present

## 2020-12-31 LAB — CUP PACEART REMOTE DEVICE CHECK
Date Time Interrogation Session: 20220108230403
Implantable Pulse Generator Implant Date: 20210201

## 2021-01-02 ENCOUNTER — Encounter (INDEPENDENT_AMBULATORY_CARE_PROVIDER_SITE_OTHER): Payer: PPO | Admitting: Ophthalmology

## 2021-01-10 ENCOUNTER — Ambulatory Visit
Admission: RE | Admit: 2021-01-10 | Discharge: 2021-01-10 | Disposition: A | Discharge status: Home / Self Care | Payer: PPO | Source: Ambulatory Visit | Attending: Neurology | Admitting: Neurology

## 2021-01-10 DIAGNOSIS — I6300 Cerebral infarction due to thrombosis of unspecified precerebral artery: Secondary | ICD-10-CM

## 2021-01-11 NOTE — Progress Notes (Signed)
Kindly inform the patient that CT scan of the head shows old strokes on the right side of the brain.  No new or worrisome finding.  No significant change compared with previous CT head from November 2021

## 2021-01-14 ENCOUNTER — Telehealth: Payer: Self-pay | Admitting: Emergency Medicine

## 2021-01-14 NOTE — Telephone Encounter (Signed)
Called patient and LVM (on DPR) regarding results of CT.  Left office number to call back if there were any questions.

## 2021-01-14 NOTE — Progress Notes (Signed)
[facility] Summary Report / Loop Recorder 

## 2021-01-21 DIAGNOSIS — E663 Overweight: Secondary | ICD-10-CM | POA: Diagnosis not present

## 2021-01-21 DIAGNOSIS — I5032 Chronic diastolic (congestive) heart failure: Secondary | ICD-10-CM | POA: Diagnosis not present

## 2021-01-21 DIAGNOSIS — I70203 Unspecified atherosclerosis of native arteries of extremities, bilateral legs: Secondary | ICD-10-CM | POA: Diagnosis not present

## 2021-01-21 DIAGNOSIS — I110 Hypertensive heart disease with heart failure: Secondary | ICD-10-CM | POA: Diagnosis not present

## 2021-01-21 DIAGNOSIS — E785 Hyperlipidemia, unspecified: Secondary | ICD-10-CM | POA: Diagnosis not present

## 2021-01-21 DIAGNOSIS — E1151 Type 2 diabetes mellitus with diabetic peripheral angiopathy without gangrene: Secondary | ICD-10-CM | POA: Diagnosis not present

## 2021-01-21 DIAGNOSIS — F0150 Vascular dementia without behavioral disturbance: Secondary | ICD-10-CM | POA: Diagnosis not present

## 2021-01-21 DIAGNOSIS — E113313 Type 2 diabetes mellitus with moderate nonproliferative diabetic retinopathy with macular edema, bilateral: Secondary | ICD-10-CM | POA: Diagnosis not present

## 2021-01-21 DIAGNOSIS — E11630 Type 2 diabetes mellitus with periodontal disease: Secondary | ICD-10-CM | POA: Diagnosis not present

## 2021-01-21 DIAGNOSIS — Z125 Encounter for screening for malignant neoplasm of prostate: Secondary | ICD-10-CM | POA: Diagnosis not present

## 2021-01-21 DIAGNOSIS — Z0001 Encounter for general adult medical examination with abnormal findings: Secondary | ICD-10-CM | POA: Diagnosis not present

## 2021-01-21 DIAGNOSIS — Z008 Encounter for other general examination: Secondary | ICD-10-CM | POA: Diagnosis not present

## 2021-01-22 NOTE — Progress Notes (Signed)
[facility] Note  01/23/2021     CHIEF COMPLAINT Patient presents for Retina Follow Up   HISTORY OF PRESENT ILLNESS: [name] is a 70 y.o. male who presents to the clinic today for:   HPI    Retina Follow Up    Patient presents with  Diabetic Retinopathy.  In both eyes.  Duration of 8 weeks.  Since onset it is stable.  I, the attending physician,  performed the HPI with the patient and updated documentation appropriately.          Comments    8 week follow up NPDR OU-  Vision is about the same, no worse OU.  BS he does not check and can't remember what PCP said.   His daughter handles his medical info for him.       Last edited by [name], MD on 01/23/2021 10:49 PM. (History)    pt states    Referring physician: Sonia Side., FNP [place],  [place] [address]  HISTORICAL INFORMATION:   Selected notes from the MEDICAL RECORD NUMBER Referred by Dr. Wyatt Portela for concern of HTN Ret   CURRENT MEDICATIONS: Current Outpatient Medications (Ophthalmic Drugs)  Medication Sig  . Polyvinyl Alcohol-Povidone (REFRESH OP) Place 1 drop into both eyes in the morning and at bedtime.   No current facility-administered medications for this visit. (Ophthalmic Drugs)   Current Outpatient Medications (Other)  Medication Sig  . atorvastatin (LIPITOR) 40 MG tablet Take 1 tablet (40 mg total) by mouth daily at 6 PM.  . carvedilol (COREG) 6.25 MG tablet Take 6.25 mg by mouth 2 (two) times daily with a meal.  . donepezil (ARICEPT) 10 MG tablet Take 1 tablet (10 mg total) by mouth at bedtime. Start 1/2 tablet daily x 4 weeks and then 1` tablet daily  . hydrochlorothiazide (HYDRODIURIL) 25 MG tablet Take 25 mg by mouth daily.  Marland Kitchen losartan (COZAAR) 100 MG tablet Take 100 mg by mouth daily.  . metFORMIN (GLUCOPHAGE) 500 MG tablet Take by mouth 2 (two) times daily with a meal.  . Multiple Vitamins-Minerals (ONE-A-DAY MENS 50+ PO) Take 1  tablet by mouth daily.  . traMADol (ULTRAM) 50 MG tablet Take 50 mg by mouth every 6 (six) hours as needed.   Current Facility-Administered Medications (Other)  Medication Route  . 0.9 %  sodium chloride infusion Intravenous  . 0.9 %  sodium chloride infusion Intravenous      REVIEW OF SYSTEMS: ROS    Positive for: Neurological, Endocrine, Eyes   Negative for: Constitutional, Gastrointestinal, Skin, Genitourinary, Musculoskeletal, HENT, Cardiovascular, Respiratory, Psychiatric, Allergic/Imm, Heme/Lymph   Last edited by Leonie Douglas, COA on 01/23/2021  2:53 PM. (History)       ALLERGIES Allergies  Allergen Reactions  . Shellfish Allergy Anaphylaxis    PAST MEDICAL HISTORY Past Medical History:  Diagnosis Date  . Acute cerebrovascular accident (CVA) ([place]) 01/20/2020  . Benign essential HTN 01/20/2020  . Cataract    Mixed form OU  . Diabetic retinopathy ([place])    NPDR OU  . Hypertensive retinopathy    OU  . Seizures ([place])    per pt- "none since teenage years"   Past Surgical History:  Procedure Laterality Date  . LOOP RECORDER INSERTION N/A 01/23/2020   Procedure: LOOP RECORDER INSERTION;  Surgeon: Deboraha Sprang, MD;  Location: [facility];  Service: Cardiovascular;  Laterality: N/A;    FAMILY HISTORY Family History  Problem  Relation Age of Onset  . COPD Mother   . Diabetes Mother   . Dementia Father   . Diabetes Father   . Cancer Father        prostate and lung  . Colon cancer Neg Hx   . Esophageal cancer Neg Hx   . Stomach cancer Neg Hx   . Rectal cancer Neg Hx     SOCIAL HISTORY Social History   Tobacco Use  . Smoking status: Never Smoker  . Smokeless tobacco: Never Used  Vaping Use  . Vaping Use: Never used  Substance Use Topics  . Alcohol use: Never  . Drug use: Never         OPHTHALMIC EXAM:  Base Eye Exam    Visual Acuity (Snellen - Linear)      Right Left   Dist [facility] 20/100 +1 20/70 +1   Dist ph [place] 20/40 20/25  Reading eye  chart very slowly today.  Question reliability OU.       Tonometry (Tonopen, 3:14 PM)      Right Left   Pressure 19 19       Pupils      Dark Light Shape React APD   Right 3 2 Round Minimal None   Left 3 2 Round Minimal None       Visual Fields (Counting fingers)      Left Right    Full Full  Grossly full OU.  Slight difficulties OD superonasally and inferonasally.        Extraocular Movement      Right Left    Full Full       Neuro/Psych    Oriented x3: Yes   Mood/Affect: Normal  Questionable memory problems today.        Dilation    Both eyes: 1.0% Mydriacyl, 2.5% Phenylephrine @ 3:14 PM        Slit Lamp and Fundus Exam    Slit Lamp Exam      Right Left   Lids/Lashes Dermatochalasis - upper lid Dermatochalasis - upper lid   Conjunctiva/Sclera Nasal and temporal Pinguecula, mild Melanosis,  Nasal and temporal Pinguecula, mild Melanosis, Subconjunctival hemorrhage nasally and inferiorly   Cornea Arcus, 2-3+ Punctate epithelial erosions, Debris in tear film Mild Arcus, 3+ Punctate epithelial erosions, Debris in tear film   Anterior Chamber Deep and quiet Deep and quiet   Iris Round and dilated, No NVI Round and dilated, No NVI   Lens 2-3+ Nuclear sclerosis, 2-3+ Cortical cataract 2-3+ Nuclear sclerosis, 2-3+ Cortical cataract   Vitreous Vitreous syneresis Vitreous syneresis       Fundus Exam      Right Left   Disc sharp rim, mild pallor, +cupping Pink and Sharp   C/D Ratio 0.5 0.5   Macula Flat, good foveal reflex, scattered MA, scattered cystic changes - improved (non-central), No edema, focal CWS nasal macula - fading Flat, good foveal reflex, scattered Microaneurysms and exudate--improving, scattered edema and blot hemes - improved   Vessels Vascular attenuation, Tortuous, severe attenuation temporal periphery  Vascular attenuation, Tortuous, severe attenuation temporal perhiphery   Periphery Attached, operculated hole at 1000 with partial pigment and +cuff  of SRF--good laser changes surrounding, scattered MA, focal DBH nasal to disc, White without pressure temporal periphery, blot hemes nasal to disc and inferior periphery Attached, scattered IRH and CWS, White without pressure temporally, focal pigmented CR scar at 0130 equator, good peripheral 360 PRP        Refraction  Manifest Refraction      Sphere Cylinder Axis Dist VA   Right -1.75 +1.00 025 20/50   Left -1.25 +1.00 160 20/30+          IMAGING AND PROCEDURES  Imaging and Procedures for @TODAY @  OCT, Retina - OU - Both Eyes       Right Eye Quality was borderline. Central Foveal Thickness: 238. Progression has improved. Findings include normal foveal contour, no SRF, no IRF (Non-central cystic changes - slightly improved from prior, mild diffuse retinal thinning--stable).   Left Eye Quality was good. Central Foveal Thickness: 245. Progression has been stable. Findings include normal foveal contour, intraretinal hyper-reflective material, no SRF, vitreomacular adhesion , no IRF (Trace cystic changes).   Notes *Images captured and stored on drive  Diagnosis / Impression:  OD: NFP, no SRF -- Non-central cystic changes - slightly improved from prior, mild diffuse retinal thinning--stable OS: NFP, no SRF/IRF, Trace cystic changes   Clinical management:  See below  Abbreviations: NFP - Normal foveal profile. CME - cystoid macular edema. PED - pigment epithelial detachment. IRF - intraretinal fluid. SRF - subretinal fluid. EZ - ellipsoid zone. ERM - epiretinal membrane. ORA - outer retinal atrophy. ORT - outer retinal tubulation. SRHM - subretinal hyper-reflective material        Intravitreal Injection, Pharmacologic Agent - OS - Left Eye       Time Out 01/23/2021. 3:56 PM. Confirmed correct patient, procedure, site, and patient consented.   Anesthesia Topical anesthesia was used. Anesthetic medications included Lidocaine 2%, Proparacaine 0.5%.   Procedure Preparation  included 5% betadine to ocular surface, eyelid speculum. A supplied (32g) needle was used.   Injection:  1.25 mg Bevacizumab (AVASTIN) 1.75m/0.05mL SOLN   NDC: 581856-314-97 Lot: [id number] , Expiration date: 02/27/2021   Route: Intravitreal, Site: Left Eye, Waste: 0 mL  Post-op Post injection exam found visual acuity of at least counting fingers. The patient tolerated the procedure well. There were no complications. The patient received written and verbal post procedure care education. Post injection medications were not given.                 ASSESSMENT/PLAN:    ICD-10-CM   1. Retinal hole of right eye  H33.321   2. Moderate nonproliferative diabetic retinopathy of both eyes with macular edema associated with type 2 diabetes mellitus (HCC)  E16/9/2022Intravitreal Injection, Pharmacologic Agent - OS - Left Eye    Bevacizumab (AVASTIN) SOLN 1.25 mg  3. Retinal edema  H35.81 OCT, Retina - OU - Both Eyes  4. Essential hypertension  I10   5. Hypertensive retinopathy of both eyes  H35.033   6. Combined forms of age-related cataract of both eyes  H25.813   7. Dry eyes  H04.123     1. Operculated retinal hole w/ cuff of SRF / focal RD, right eye.    - operculated hole located at 1000 with partial pigment and +cuff of SRF / focal RD  - s/p retinopexy OD (03.17.21) -- good laser changes surrounding  - monitor  2,3. Moderate non-proliferative diabetic retinopathy, OU  - delayed f/u -- 8 wks instead of 5  - s/p IVA OS #1 (05.07.21), #2 (06.04.21), #3 (07.06.21), #4 (08.03.21), #5 (08.31.21), #6 (9.28.21), #7 (11.2.21), #8 (12.07.21)  - s/p PRP OS (04.09.21) -- good laser changes  - repeat FA (09.28.21) shows late leaking MA OU, significant capillary drop-out   - exam shows scattered MA/IRH/CWS OU -- improving  - BCVA OD stable at 20/40;  OS stable at 20/25  - OCT shows OD: non-central cystic changes, slightly improved; OS: trace cystic changes  - recommend IVA OS #9 today, 02.02.22 --  maintenance w/ ext to 8 wks  - pt wishes to proceed with IVA OS  - RBA of procedure discussed, questions answered  - informed consent obtained, signed and scanned, 05.07.21  - see procedure note  - f/u 8 weeks -- DFE/OCT  4,5. Hypertensive retinopathy OU  - discussed importance of tight BP control  - monitor  6. Mixed form age related cataract OU  - The symptoms of cataract, surgical options, and treatments and risks were discussed with patient.  - discussed diagnosis and progression  - under the expert management of Dr. Zenia Resides  - now that macular edema is improved and relatively stable, pt is clear from a retina standpoint to proceed with cataract surgery when pt and surgeon are ready  - will send back to Dr. Zenia Resides for cataract evaluation  7. Dry eyes OU  - recommend artificial tears and lubricating ointment as needed  Ophthalmic Meds Ordered this visit:  Meds ordered this encounter  Medications  . Bevacizumab (AVASTIN) SOLN 1.25 mg      Return in about 8 weeks (around 03/20/2021) for f/u NPDR OU, DFE, OCT.  There are no Patient Instructions on file for this visit.  Explained the diagnoses, plan, and follow up with the patient and they expressed understanding.  Patient expressed understanding of the importance of proper follow up care.   This document serves as a record of services personally performed by Gardiner Sleeper, MD, PhD. It was created on their behalf by Roselee Nova, COMT. The creation of this record is the provider's dictation and/or activities during the visit.  Electronically signed by: Roselee Nova, COMT 01/23/21 10:56 PM   This document serves as a record of services personally performed by Gardiner Sleeper, MD, PhD. It was created on their behalf by San Jetty. Owens Shark, OA an ophthalmic technician. The creation of this record is the provider's dictation and/or activities during the visit.    Electronically signed by: San Jetty. Owens Shark, [place] 02.02.2022 10:56  PM  Gardiner Sleeper, M.D., Ph.D. Diseases & Surgery of the Retina and Vitreous [facility]  I have reviewed the above documentation for accuracy and completeness, and I agree with the above. Gardiner Sleeper, M.D., Ph.D. 01/23/21 10:56 PM  Abbreviations: M myopia (nearsighted); A astigmatism; H hyperopia (farsighted); P presbyopia; Mrx spectacle prescription;  CTL contact lenses; OD right eye; OS left eye; OU both eyes  XT exotropia; ET esotropia; PEK punctate epithelial keratitis; PEE punctate epithelial erosions; DES dry eye syndrome; MGD meibomian gland dysfunction; ATs artificial tears; PFAT's preservative free artificial tears; [place] nuclear sclerotic cataract; PSC posterior subcapsular cataract; ERM epi-retinal membrane; PVD posterior vitreous detachment; RD retinal detachment; DM diabetes mellitus; DR diabetic retinopathy; NPDR non-proliferative diabetic retinopathy; PDR proliferative diabetic retinopathy; CSME clinically significant macular edema; DME diabetic macular edema; dbh dot blot hemorrhages; CWS cotton wool spot; POAG primary open angle glaucoma; C/D cup-to-disc ratio; HVF humphrey visual field; GVF goldmann visual field; OCT optical coherence tomography; IOP intraocular pressure; BRVO Branch retinal vein occlusion; CRVO central retinal vein occlusion; CRAO central retinal artery occlusion; BRAO branch retinal artery occlusion; RT retinal tear; SB scleral buckle; PPV pars plana vitrectomy; VH Vitreous hemorrhage; PRP panretinal laser photocoagulation; IVK intravitreal kenalog; VMT vitreomacular traction; MH Macular hole;  NVD neovascularization of the disc; NVE neovascularization  elsewhere; AREDS age related eye disease study; ARMD age related macular degeneration; POAG primary open angle glaucoma; EBMD epithelial/anterior basement membrane dystrophy; ACIOL anterior chamber intraocular lens; IOL intraocular lens; PCIOL posterior chamber intraocular lens; Phaco/IOL  phacoemulsification with intraocular lens placement; [place] photorefractive keratectomy; LASIK laser assisted in situ keratomileusis; HTN hypertension; DM diabetes mellitus; COPD chronic obstructive pulmonary disease

## 2021-01-23 ENCOUNTER — Other Ambulatory Visit: Payer: Self-pay

## 2021-01-23 ENCOUNTER — Ambulatory Visit (INDEPENDENT_AMBULATORY_CARE_PROVIDER_SITE_OTHER): Payer: PPO | Admitting: Ophthalmology

## 2021-01-23 ENCOUNTER — Encounter (INDEPENDENT_AMBULATORY_CARE_PROVIDER_SITE_OTHER): Payer: Self-pay | Admitting: Ophthalmology

## 2021-01-23 DIAGNOSIS — H35033 Hypertensive retinopathy, bilateral: Secondary | ICD-10-CM

## 2021-01-23 DIAGNOSIS — I10 Essential (primary) hypertension: Secondary | ICD-10-CM | POA: Diagnosis not present

## 2021-01-23 DIAGNOSIS — E113313 Type 2 diabetes mellitus with moderate nonproliferative diabetic retinopathy with macular edema, bilateral: Secondary | ICD-10-CM

## 2021-01-23 DIAGNOSIS — H33321 Round hole, right eye: Secondary | ICD-10-CM | POA: Diagnosis not present

## 2021-01-23 DIAGNOSIS — H04123 Dry eye syndrome of bilateral lacrimal glands: Secondary | ICD-10-CM

## 2021-01-23 DIAGNOSIS — H3581 Retinal edema: Secondary | ICD-10-CM | POA: Diagnosis not present

## 2021-01-23 DIAGNOSIS — H25813 Combined forms of age-related cataract, bilateral: Secondary | ICD-10-CM

## 2021-01-23 MED ORDER — BEVACIZUMAB CHEMO INJECTION 1.25MG/0.05ML SYRINGE FOR KALEIDOSCOPE
1.2500 mg | INTRAVITREAL | Status: AC | PRN
  Administered 2021-01-23: 1.25 mg via INTRAVITREAL

## 2021-02-06 ENCOUNTER — Ambulatory Visit: Payer: PPO | Admitting: Podiatry

## 2021-02-06 ENCOUNTER — Other Ambulatory Visit: Payer: Self-pay | Admitting: Neurology

## 2021-02-06 ENCOUNTER — Other Ambulatory Visit: Payer: Self-pay

## 2021-02-06 DIAGNOSIS — M79675 Pain in left toe(s): Secondary | ICD-10-CM | POA: Diagnosis not present

## 2021-02-06 DIAGNOSIS — M79674 Pain in right toe(s): Secondary | ICD-10-CM | POA: Diagnosis not present

## 2021-02-06 DIAGNOSIS — B351 Tinea unguium: Secondary | ICD-10-CM | POA: Diagnosis not present

## 2021-02-07 ENCOUNTER — Encounter: Payer: Self-pay | Admitting: Podiatry

## 2021-02-07 NOTE — Progress Notes (Signed)
  Subjective:  Patient ID: [name], male    DOB: 07/27/1951,  MRN: [id number]  Chief Complaint  Patient presents with  . routine foot care    Diabetic foot care  Nail trim    69 y.o. male returns for the above complaint.  Patient presents with thickened elongated dystrophic toenails x10.  Patient is a diabetic with last A1c of 5.6.  Appears the patient is well controlled diabetic.  She would like to have her nails debrided down as she is not able to do it herself.  She denies any other acute complaints.  Objective:  There were no vitals filed for this visit. Podiatric Exam: Vascular: dorsalis pedis and posterior tibial pulses are palpable bilateral. Capillary return is immediate. Temperature gradient is WNL. Skin turgor WNL  Sensorium: Normal Semmes Weinstein monofilament test. Normal tactile sensation bilaterally. Nail Exam: Pt has thick disfigured discolored nails with subungual debris noted bilateral entire nail hallux through fifth toenails.  Pain on palpation to the nails. Ulcer Exam: There is no evidence of ulcer or pre-ulcerative changes or infection. Orthopedic Exam: Muscle tone and strength are WNL. No limitations in general ROM. No crepitus or effusions noted. HAV  B/L.  Hammer toes 2-5  B/L. Skin: No Porokeratosis. No infection or ulcers    Assessment & Plan:   1. Pain due to onychomycosis of toenails of both feet     Patient was evaluated and treated and all questions answered.  Onychomycosis with pain  -Nails palliatively debrided as below. -Educated on self-care  Procedure: Nail Debridement Rationale: pain  Type of Debridement: manual, sharp debridement. Instrumentation: Nail nipper, rotary burr. Number of Nails: 10  Procedures and Treatment: Consent by patient was obtained for treatment procedures. The patient understood the discussion of treatment and procedures well. All questions were answered thoroughly reviewed. Debridement of mycotic and  hypertrophic toenails, 1 through 5 bilateral and clearing of subungual debris. No ulceration, no infection noted.  Return Visit-Office Procedure: Patient instructed to return to the office for a follow up visit 3 months for continued evaluation and treatment.  Nicholes Rough, DPM    No follow-ups on file.

## 2021-02-11 ENCOUNTER — Ambulatory Visit: Payer: PPO | Admitting: Neurology

## 2021-02-26 ENCOUNTER — Ambulatory Visit: Payer: PPO | Admitting: Neurology

## 2021-03-19 NOTE — Progress Notes (Addendum)
[facility] Note  03/20/2021     CHIEF COMPLAINT Patient presents for Retina Follow Up   HISTORY OF PRESENT ILLNESS: [name] is a 70 y.o. male who presents to the clinic today for:   HPI    Retina Follow Up    Patient presents with  Diabetic Retinopathy.  In both eyes.  This started months ago.  Severity is moderate.  Duration of 8 weeks.  Since onset it is stable.  I, the attending physician,  performed the HPI with the patient and updated documentation appropriately.          Comments    70 y/o male pt here for 8 wk f/u for mod NPDR OU.  No change in [place] OU.  Denies pain, FOL, floaters.  No gtts.  Eyes feel dry and itchy.  BS and A1C unknown.       Last edited by [name], MD on 03/20/2021  5:16 PM. (History)    pt states when he wakes up in the mornings, his eyes are red   Referring physician: [name], MD [address] [place],  [place] [address]  HISTORICAL INFORMATION:   Selected notes from the MEDICAL RECORD NUMBER Referred by Dr. Wyatt Portela for concern of HTN Ret   CURRENT MEDICATIONS: Current Outpatient Medications (Ophthalmic Drugs)  Medication Sig  . Polyvinyl Alcohol-Povidone (REFRESH OP) Place 1 drop into both eyes in the morning and at bedtime.   No current facility-administered medications for this visit. (Ophthalmic Drugs)   Current Outpatient Medications (Other)  Medication Sig  . aspirin 325 MG EC tablet aspirin 325 mg tablet,delayed release  . atorvastatin (LIPITOR) 40 MG tablet Take 1 tablet (40 mg total) by mouth daily at 6 PM.  . carvedilol (COREG) 6.25 MG tablet Take 6.25 mg by mouth 2 (two) times daily with a meal.  . donepezil (ARICEPT) 10 MG tablet Take 1 tablet (10 mg total) by mouth at bedtime.  . hydrALAZINE (APRESOLINE) 50 MG tablet Take 50 mg by mouth 2 (two) times daily.  . hydrochlorothiazide (HYDRODIURIL) 25 MG tablet Take 25 mg by mouth daily.  Marland Kitchen losartan (COZAAR) 100 MG  tablet Take 100 mg by mouth daily.  Marland Kitchen losartan (COZAAR) 50 MG tablet   . lovastatin (MEVACOR) 10 MG tablet Take 1 tablet by mouth daily.  . metFORMIN (GLUCOPHAGE) 500 MG tablet Take by mouth 2 (two) times daily with a meal.  . Multiple Vitamins-Minerals (ONE-A-DAY MENS 50+ PO) Take 1 tablet by mouth daily.  Marland Kitchen PFIZER-BIONT COVID-19 VAC-TRIS SUSP injection   . PFIZER-BIONTECH COVID-19 VACC 30 MCG/0.3ML injection   . Sodium Chloride Flush (NORMAL SALINE FLUSH) 0.9 % SOLN sodium chloride 0.9 % intravenous solution   500 mL by intraven. route.  . Sodium Sulfate-Mag Sulfate-KCl (SUTAB) [phone] MG TABS Sutab 1.479-0.188-0.225 gram tablet  . spironolactone (ALDACTONE) 25 MG tablet SMARTSIG:0.5 Tablet(s) By Mouth Every Other Day  . traMADol (ULTRAM) 50 MG tablet Take 50 mg by mouth every 6 (six) hours as needed.   Current Facility-Administered Medications (Other)  Medication Route  . 0.9 %  sodium chloride infusion Intravenous  . 0.9 %  sodium chloride infusion Intravenous      REVIEW OF SYSTEMS: ROS    Positive for: Neurological, Endocrine, Eyes   Negative for: Constitutional, Gastrointestinal, Skin, Genitourinary, Musculoskeletal, HENT, Cardiovascular, Respiratory, Psychiatric, Allergic/Imm, Heme/Lymph   Last edited by Matthew Folks, COA on 03/20/2021  3:17 PM. (History)  ALLERGIES Allergies  Allergen Reactions  . Shellfish Allergy Anaphylaxis    PAST MEDICAL HISTORY Past Medical History:  Diagnosis Date  . Acute cerebrovascular accident (CVA) ([place]) 01/20/2020  . Benign essential HTN 01/20/2020  . Cataract    Mixed form OU  . Diabetic retinopathy ([place])    NPDR OU  . Hypertensive retinopathy    OU  . Seizures ([place])    per pt- "none since teenage years"   Past Surgical History:  Procedure Laterality Date  . LOOP RECORDER INSERTION N/A 01/23/2020   Procedure: LOOP RECORDER INSERTION;  Surgeon: Deboraha Sprang, MD;  Location: [facility];  Service:  Cardiovascular;  Laterality: N/A;    FAMILY HISTORY Family History  Problem Relation Age of Onset  . COPD Mother   . Diabetes Mother   . Dementia Father   . Diabetes Father   . Cancer Father        prostate and lung  . Colon cancer Neg Hx   . Esophageal cancer Neg Hx   . Stomach cancer Neg Hx   . Rectal cancer Neg Hx     SOCIAL HISTORY Social History   Tobacco Use  . Smoking status: Never Smoker  . Smokeless tobacco: Never Used  Vaping Use  . Vaping Use: Never used  Substance Use Topics  . Alcohol use: Never  . Drug use: Never         OPHTHALMIC EXAM:  Base Eye Exam    Visual Acuity (Snellen - Linear)      Right Left   Dist [place] 20/100 - 20/60 -   Dist ph [place] 20/40 20/25 -       Tonometry (Tonopen, 3:20 PM)      Right Left   Pressure 17 18       Pupils      Dark Light Shape React APD   Right 3 2 Round Minimal None   Left 3 2 Round Minimal None       Visual Fields (Counting fingers)      Left Right    Full Full       Extraocular Movement      Right Left    Full, Ortho Full, Ortho       Neuro/Psych    Oriented x3: Yes   Mood/Affect: Normal       Dilation    Both eyes: 1.0% Mydriacyl, 2.5% Phenylephrine @ 3:20 PM        Slit Lamp and Fundus Exam    Slit Lamp Exam      Right Left   Lids/Lashes Dermatochalasis - upper lid Dermatochalasis - upper lid   Conjunctiva/Sclera Nasal and temporal Pinguecula, mild Melanosis,  Nasal and temporal Pinguecula, mild Melanosis, Subconjunctival hemorrhage nasally and inferiorly   Cornea Arcus, 2-3+ Punctate epithelial erosions, Debris in tear film Mild Arcus, 3+ Punctate epithelial erosions, Debris in tear film   Anterior Chamber Deep and quiet Deep and quiet   Iris Round and dilated, No NVI Round and dilated, No NVI   Lens 2-3+ Nuclear sclerosis, 2-3+ Cortical cataract 2-3+ Nuclear sclerosis, 2-3+ Cortical cataract   Vitreous Vitreous syneresis Vitreous syneresis       Fundus Exam      Right Left    Disc sharp rim, mild pallor, +cupping Pink and Sharp   C/D Ratio 0.5 0.5   Macula Flat, good foveal reflex, scattered MA/DBH greatest superior mac, scattered, trace cystic changes - improved (non-central), No edema, focal CWS nasal macula -  fading Flat, good foveal reflex, scattered Microaneurysms and exudate--improving, scattered edema and blot hemes - improved, persistent, non-central cystic changes   Vessels Vascular attenuation, Tortuous, severe attenuation temporal periphery  Vascular attenuation, Tortuous, severe attenuation temporal perhiphery   Periphery Attached, operculated hole at 1000 with partial pigment and +cuff of SRF--good laser changes surrounding, scattered MA, focal DBH nasal to disc, White without pressure temporal periphery, blot hemes nasal to disc and inferior periphery Attached, scattered IRH and CWS, White without pressure temporally, focal pigmented CR scar at 0130 equator, good peripheral 360 PRP          IMAGING AND PROCEDURES  Imaging and Procedures for _0 @  OCT, Retina - OU - Both Eyes       Right Eye Quality was borderline. Central Foveal Thickness: 240. Progression has been stable. Findings include normal foveal contour, no SRF, no IRF (Non-central cystic changes - slightly improved from prior, mild diffuse retinal thinning -- stable).   Left Eye Quality was good. Central Foveal Thickness: 241. Progression has been stable. Findings include normal foveal contour, intraretinal hyper-reflective material, no SRF, vitreomacular adhesion , no IRF (Mild interval improvement in trace cystic changes).   Notes *Images captured and stored on drive  Diagnosis / Impression:  OD: NFP, no SRF -- Non-central cystic changes - slightly improved from prior, mild diffuse retinal thinning -- stable OS: NFP, no SRF/IRF, Mild interval improvement in trace cystic changes   Clinical management:  See below  Abbreviations: NFP - Normal foveal profile. CME - cystoid macular  edema. PED - pigment epithelial detachment. IRF - intraretinal fluid. SRF - subretinal fluid. EZ - ellipsoid zone. ERM - epiretinal membrane. ORA - outer retinal atrophy. ORT - outer retinal tubulation. SRHM - subretinal hyper-reflective material        Intravitreal Injection, Pharmacologic Agent - OS - Left Eye       Time Out 03/20/2021. 3:57 PM. Confirmed correct patient, procedure, site, and patient consented.   Anesthesia Topical anesthesia was used. Anesthetic medications included Lidocaine 2%, Proparacaine 0.5%.   Procedure Preparation included 5% betadine to ocular surface, eyelid speculum. A (32g) needle was used.   Injection:  1.25 mg Bevacizumab (AVASTIN) 1.49m/0.05mL SOLN   NDC: 540981-191-47 Lot: [id number], Expiration date: 04/30/2021   Route: Intravitreal, Site: Left Eye, Waste: 0.05 mL  Post-op Post injection exam found visual acuity of at least counting fingers. The patient tolerated the procedure well. There were no complications. The patient received written and verbal post procedure care education. Post injection medications were not given.                 ASSESSMENT/PLAN:    ICD-10-CM   1. Retinal hole of right eye  H33.321   2. Moderate nonproliferative diabetic retinopathy of both eyes with macular edema associated with type 2 diabetes mellitus (HCC)  EW29.5621Intravitreal Injection, Pharmacologic Agent - OS - Left Eye    Bevacizumab (AVASTIN) SOLN 1.25 mg  3. Retinal edema  H35.81 OCT, Retina - OU - Both Eyes  4. Essential hypertension  I10   5. Hypertensive retinopathy of both eyes  H35.033   6. Combined forms of age-related cataract of both eyes  H25.813   7. Dry eyes  H04.123     1. Operculated retinal hole w/ cuff of SRF / focal RD, right eye.    - operculated hole located at 1000 with partial pigment and +cuff of SRF / focal RD  - s/p retinopexy OD (03.17.21) -- good laser changes  surrounding  - monitor  2,3. Moderate non-proliferative  diabetic retinopathy, OU  - s/p IVA OS #1 (05.07.21), #2 (06.04.21), #3 (07.06.21), #4 (08.03.21), #5 (08.31.21), #6 (9.28.21), #7 (11.2.21), #8 (12.07.21), #9 (02.02.22)  - s/p PRP OS (04.09.21) -- good laser changes  - repeat FA (09.28.21) shows late leaking MA OU, significant capillary drop-out   - exam shows scattered MA/IRH/CWS OU -- improving  - BCVA OD stable at 20/40; OS stable at 20/25  - OCT shows OD: tr non-central cystic changes, slightly improved; OS: trace cystic changes at 8 wks  - recommend IVA OS #10 today, 03.30.22 -- maintenance w/ f/u again at 8 wks  - pt wishes to proceed with IVA OS  - RBA of procedure discussed, questions answered  - informed consent obtained, signed and scanned, 05.07.21  - see procedure note  - f/u 8 weeks -- DFE/OCT  4,5. Hypertensive retinopathy OU  - discussed importance of tight BP control  - monitor  6. Mixed form age related cataract OU  - The symptoms of cataract, surgical options, and treatments and risks were discussed with patient.  - discussed diagnosis and progression  - under the expert management of Dr. Zenia Resides  - now that macular edema is improved and relatively stable, pt is clear from a retina standpoint to proceed with cataract surgery when pt and surgeon are ready  - will send back to Dr Zenia Resides for cataract evaluation  7. Dry eyes OU  - recommend artificial tears and lubricating ointment as needed  Ophthalmic Meds Ordered this visit:  Meds ordered this encounter  Medications  . Bevacizumab (AVASTIN) SOLN 1.25 mg      Return in about 8 weeks (around 05/15/2021) for f/u NPDR OU, DFE, OCT.  There are no Patient Instructions on file for this visit.  Explained the diagnoses, plan, and follow up with the patient and they expressed understanding.  Patient expressed understanding of the importance of proper follow up care.   This document serves as a record of services personally performed by Gardiner Sleeper, MD, PhD. It  was created on their behalf by Roselee Nova, COMT. The creation of this record is the provider's dictation and/or activities during the visit.  Electronically signed by: Roselee Nova, COMT 03/20/21 5:21 PM   This document serves as a record of services personally performed by Gardiner Sleeper, MD, PhD. It was created on their behalf by San Jetty. Owens Shark, OA an ophthalmic technician. The creation of this record is the provider's dictation and/or activities during the visit.    Electronically signed by: San Jetty. [place], [place] 03.30.2022 5:21 PM  Gardiner Sleeper, M.D., Ph.D. Diseases & Surgery of the Retina and Vitreous [facility]  I have reviewed the above documentation for accuracy and completeness, and I agree with the above. Gardiner Sleeper, M.D., Ph.D. 03/20/21 5:21 PM   Abbreviations: M myopia (nearsighted); A astigmatism; H hyperopia (farsighted); P presbyopia; Mrx spectacle prescription;  CTL contact lenses; OD right eye; OS left eye; OU both eyes  XT exotropia; ET esotropia; PEK punctate epithelial keratitis; PEE punctate epithelial erosions; DES dry eye syndrome; MGD meibomian gland dysfunction; ATs artificial tears; PFAT's preservative free artificial tears; [place] nuclear sclerotic cataract; PSC posterior subcapsular cataract; ERM epi-retinal membrane; PVD posterior vitreous detachment; RD retinal detachment; DM diabetes mellitus; DR diabetic retinopathy; NPDR non-proliferative diabetic retinopathy; PDR proliferative diabetic retinopathy; CSME clinically significant macular edema; DME diabetic macular edema; dbh dot blot hemorrhages; CWS  cotton wool spot; POAG primary open angle glaucoma; C/D cup-to-disc ratio; HVF humphrey visual field; GVF goldmann visual field; OCT optical coherence tomography; IOP intraocular pressure; BRVO Branch retinal vein occlusion; CRVO central retinal vein occlusion; CRAO central retinal artery occlusion; BRAO branch retinal artery occlusion; RT  retinal tear; SB scleral buckle; PPV pars plana vitrectomy; VH Vitreous hemorrhage; PRP panretinal laser photocoagulation; IVK intravitreal kenalog; VMT vitreomacular traction; MH Macular hole;  NVD neovascularization of the disc; NVE neovascularization elsewhere; AREDS age related eye disease study; ARMD age related macular degeneration; POAG primary open angle glaucoma; EBMD epithelial/anterior basement membrane dystrophy; ACIOL anterior chamber intraocular lens; IOL intraocular lens; PCIOL posterior chamber intraocular lens; Phaco/IOL phacoemulsification with intraocular lens placement; [place] photorefractive keratectomy; LASIK laser assisted in situ keratomileusis; HTN hypertension; DM diabetes mellitus; COPD chronic obstructive pulmonary disease

## 2021-03-20 ENCOUNTER — Ambulatory Visit (INDEPENDENT_AMBULATORY_CARE_PROVIDER_SITE_OTHER): Payer: PPO | Admitting: Ophthalmology

## 2021-03-20 ENCOUNTER — Encounter (INDEPENDENT_AMBULATORY_CARE_PROVIDER_SITE_OTHER): Payer: Self-pay | Admitting: Ophthalmology

## 2021-03-20 ENCOUNTER — Other Ambulatory Visit: Payer: Self-pay

## 2021-03-20 DIAGNOSIS — H35033 Hypertensive retinopathy, bilateral: Secondary | ICD-10-CM

## 2021-03-20 DIAGNOSIS — I10 Essential (primary) hypertension: Secondary | ICD-10-CM

## 2021-03-20 DIAGNOSIS — E113313 Type 2 diabetes mellitus with moderate nonproliferative diabetic retinopathy with macular edema, bilateral: Secondary | ICD-10-CM

## 2021-03-20 DIAGNOSIS — H3581 Retinal edema: Secondary | ICD-10-CM

## 2021-03-20 DIAGNOSIS — H04123 Dry eye syndrome of bilateral lacrimal glands: Secondary | ICD-10-CM

## 2021-03-20 DIAGNOSIS — H25813 Combined forms of age-related cataract, bilateral: Secondary | ICD-10-CM

## 2021-03-20 DIAGNOSIS — H33321 Round hole, right eye: Secondary | ICD-10-CM

## 2021-03-20 MED ORDER — BEVACIZUMAB CHEMO INJECTION 1.25MG/0.05ML SYRINGE FOR KALEIDOSCOPE
1.2500 mg | INTRAVITREAL | Status: AC | PRN
  Administered 2021-03-20: 1.25 mg via INTRAVITREAL

## 2021-05-08 ENCOUNTER — Ambulatory Visit: Payer: PPO | Admitting: Podiatry

## 2021-05-10 NOTE — Progress Notes (Addendum)
[facility] Note  05/15/2021     CHIEF COMPLAINT Patient presents for Retina Follow Up   HISTORY OF PRESENT ILLNESS: [name] is a 70 y.o. male who presents to the clinic today for:   HPI    Retina Follow Up    Patient presents with  Diabetic Retinopathy.  In both eyes.  This started 8 weeks ago.  I, the attending physician,  performed the HPI with the patient and updated documentation appropriately.          Comments    Patient here for 8 weeks retina follow up for NPDR OU. Patient states vision sometimes in am eyes water and itch. Sees ok. No problems. No eye pain.        Last edited by [name], MD on 05/16/2021 10:57 PM. (History)    pt states his eyes water and itch a lot   Referring physician: [name], MD [id number] [place],  [place] [address]  HISTORICAL INFORMATION:   Selected notes from the MEDICAL RECORD NUMBER Referred by Dr. Wyatt Portela for concern of HTN Ret   CURRENT MEDICATIONS: Current Outpatient Medications (Ophthalmic Drugs)  Medication Sig  . Polyvinyl Alcohol-Povidone (REFRESH OP) Place 1 drop into both eyes in the morning and at bedtime.   No current facility-administered medications for this visit. (Ophthalmic Drugs)   Current Outpatient Medications (Other)  Medication Sig  . aspirin 325 MG EC tablet aspirin 325 mg tablet,delayed release  . atorvastatin (LIPITOR) 40 MG tablet Take 1 tablet (40 mg total) by mouth daily at 6 PM.  . carvedilol (COREG) 6.25 MG tablet Take 6.25 mg by mouth 2 (two) times daily with a meal.  . donepezil (ARICEPT) 10 MG tablet Take 1 tablet (10 mg total) by mouth at bedtime.  . hydrALAZINE (APRESOLINE) 50 MG tablet Take 50 mg by mouth 2 (two) times daily.  . hydrochlorothiazide (HYDRODIURIL) 25 MG tablet Take 25 mg by mouth daily.  Marland Kitchen losartan (COZAAR) 100 MG tablet Take 100 mg by mouth daily.  Marland Kitchen losartan (COZAAR) 50 MG tablet   . lovastatin (MEVACOR) 10 MG  tablet Take 1 tablet by mouth daily.  . metFORMIN (GLUCOPHAGE) 500 MG tablet Take by mouth 2 (two) times daily with a meal.  . Multiple Vitamins-Minerals (ONE-A-DAY MENS 50+ PO) Take 1 tablet by mouth daily.  Marland Kitchen PFIZER-BIONT COVID-19 VAC-TRIS SUSP injection   . PFIZER-BIONTECH COVID-19 VACC 30 MCG/0.3ML injection   . Sodium Chloride Flush (NORMAL SALINE FLUSH) 0.9 % SOLN sodium chloride 0.9 % intravenous solution   500 mL by intraven. route.  . Sodium Sulfate-Mag Sulfate-KCl (SUTAB) [phone] MG TABS Sutab 1.479-0.188-0.225 gram tablet  . spironolactone (ALDACTONE) 25 MG tablet SMARTSIG:0.5 Tablet(s) By Mouth Every Other Day  . traMADol (ULTRAM) 50 MG tablet Take 50 mg by mouth every 6 (six) hours as needed.   Current Facility-Administered Medications (Other)  Medication Route  . 0.9 %  sodium chloride infusion Intravenous  . 0.9 %  sodium chloride infusion Intravenous      REVIEW OF SYSTEMS: ROS    Positive for: Neurological, Endocrine, Eyes   Negative for: Constitutional, Gastrointestinal, Skin, Genitourinary, Musculoskeletal, HENT, Cardiovascular, Respiratory, Psychiatric, Allergic/Imm, Heme/Lymph   Last edited by Theodore Demark, COA on 05/15/2021  3:14 PM. (History)       ALLERGIES Allergies  Allergen Reactions  . Shellfish Allergy Anaphylaxis    PAST MEDICAL HISTORY Past Medical History:  Diagnosis Date  .  Acute cerebrovascular accident (CVA) ([place]) 01/20/2020  . Benign essential HTN 01/20/2020  . Cataract    Mixed form OU  . Diabetic retinopathy ([place])    NPDR OU  . Hypertensive retinopathy    OU  . Seizures ([place])    per pt- "none since teenage years"   Past Surgical History:  Procedure Laterality Date  . LOOP RECORDER INSERTION N/A 01/23/2020   Procedure: LOOP RECORDER INSERTION;  Surgeon: Deboraha Sprang, MD;  Location: [facility];  Service: Cardiovascular;  Laterality: N/A;    FAMILY HISTORY Family History  Problem Relation Age of Onset  . COPD  Mother   . Diabetes Mother   . Dementia Father   . Diabetes Father   . Cancer Father        prostate and lung  . Colon cancer Neg Hx   . Esophageal cancer Neg Hx   . Stomach cancer Neg Hx   . Rectal cancer Neg Hx     SOCIAL HISTORY Social History   Tobacco Use  . Smoking status: Never Smoker  . Smokeless tobacco: Never Used  Vaping Use  . Vaping Use: Never used  Substance Use Topics  . Alcohol use: Never  . Drug use: Never         OPHTHALMIC EXAM:  Base Eye Exam    Visual Acuity (Snellen - Linear)      Right Left   Dist [place] 20/50 20/30   Dist ph [place] NI 20/25 -2       Tonometry (Tonopen, 3:11 PM)      Right Left   Pressure 13 16       Pupils      Dark Light Shape React APD   Right 3 2 Round Minimal None   Left 3 2 Round Minimal None       Visual Fields (Counting fingers)      Left Right    Full Full       Extraocular Movement      Right Left    Full, Ortho Full, Ortho       Neuro/Psych    Oriented x3: Yes   Mood/Affect: Normal       Dilation    Both eyes: 1.0% Mydriacyl, 2.5% Phenylephrine @ 3:11 PM        Slit Lamp and Fundus Exam    Slit Lamp Exam      Right Left   Lids/Lashes Dermatochalasis - upper lid Dermatochalasis - upper lid   Conjunctiva/Sclera Nasal and temporal Pinguecula, mild Melanosis,  Nasal and temporal Pinguecula, mild Melanosis, Subconjunctival hemorrhage nasally and inferiorly   Cornea Arcus, 2-3+ Punctate epithelial erosions, Debris in tear film Mild Arcus, 3+ Punctate epithelial erosions, Debris in tear film   Anterior Chamber Deep and quiet Deep and quiet   Iris Round and dilated, No NVI Round and dilated, No NVI   Lens 2-3+ Nuclear sclerosis, 2-3+ Cortical cataract 2-3+ Nuclear sclerosis, 2-3+ Cortical cataract   Vitreous Vitreous syneresis Vitreous syneresis       Fundus Exam      Right Left   Disc sharp rim, mild pallor, +cupping Pink and Sharp   C/D Ratio 0.5 0.5   Macula Flat, good foveal reflex, scattered  MA/DBH greatest superior mac, scattered, trace cystic changes - improved (non-central), No frank edema Flat, good foveal reflex, scattered Microaneurysms and exudate--improving, scattered edema and blot hemes - improved   Vessels Vascular attenuation, Tortuous, severe attenuation temporal periphery  Vascular attenuation, Tortuous,  severe attenuation temporal perhiphery   Periphery Attached, operculated hole at 1000 with partial pigment and +cuff of SRF--good laser changes surrounding, scattered MA, focal DBH nasal to disc, White without pressure temporal periphery, blot hemes nasal to disc and inferior periphery Attached, scattered IRH and CWS, White without pressure temporally, focal pigmented CR scar at 0130 equator, good peripheral 360 PRP          IMAGING AND PROCEDURES  Imaging and Procedures for _0 @  OCT, Retina - OU - Both Eyes       Right Eye Quality was borderline. Central Foveal Thickness: 240. Progression has improved. Findings include normal foveal contour, no SRF, no IRF (Non-central cystic changes - slightly improved from prior, mild diffuse retinal thinning -- stable).   Left Eye Quality was borderline. Central Foveal Thickness: 255. Progression has been stable. Findings include normal foveal contour, intraretinal hyper-reflective material, no SRF, vitreomacular adhesion , no IRF (stable improvement in trace cystic changes).   Notes *Images captured and stored on drive  Diagnosis / Impression:  OD: NFP, no SRF -- Non-central cystic changes - slightly improved from prior, mild diffuse retinal thinning -- stable OS: NFP, no SRF/IRF, stable improvement in trace cystic changes   Clinical management:  See below  Abbreviations: NFP - Normal foveal profile. CME - cystoid macular edema. PED - pigment epithelial detachment. IRF - intraretinal fluid. SRF - subretinal fluid. EZ - ellipsoid zone. ERM - epiretinal membrane. ORA - outer retinal atrophy. ORT - outer retinal  tubulation. SRHM - subretinal hyper-reflective material        Intravitreal Injection, Pharmacologic Agent - OS - Left Eye       Time Out 05/15/2021. 3:41 PM. Confirmed correct patient, procedure, site, and patient consented.   Anesthesia Topical anesthesia was used. Anesthetic medications included Lidocaine 2%, Proparacaine 0.5%.   Procedure Preparation included 5% betadine to ocular surface, eyelid speculum. A supplied needle was used.   Injection:  1.25 mg Bevacizumab (AVASTIN) 1.50m/0.05mL SOLN   NDC: 585631-497-02 Lot:: [id number] Expiration date: 07/01/2021   Route: Intravitreal, Site: Left Eye, Waste: 0 mL  Post-op Post injection exam found visual acuity of at least counting fingers. The patient tolerated the procedure well. There were no complications. The patient received written and verbal post procedure care education.                 ASSESSMENT/PLAN:    ICD-10-CM   1. Retinal hole of right eye  H33.321   2. Moderate nonproliferative diabetic retinopathy of both eyes with macular edema associated with type 2 diabetes mellitus (HCC)  EF02.7741Intravitreal Injection, Pharmacologic Agent - OS - Left Eye    Bevacizumab (AVASTIN) SOLN 1.25 mg  3. Retinal edema  H35.81 OCT, Retina - OU - Both Eyes  4. Essential hypertension  I10   5. Hypertensive retinopathy of both eyes  H35.033   6. Combined forms of age-related cataract of both eyes  H25.813   7. Dry eyes  H04.123     1. Operculated retinal hole w/ cuff of SRF / focal RD, right eye.    - operculated hole located at 1000 with partial pigment and +cuff of SRF / focal RD  - s/p retinopexy OD (03.17.21) -- good laser changes surrounding  - monitor  2,3. Moderate non-proliferative diabetic retinopathy, OU  - s/p IVA OS #1 (05.07.21), #2 (06.04.21), #3 (07.06.21), #4 (08.03.21), #5 (08.31.21), #6 (9.28.21), #7 (11.2.21), #8 (12.07.21), #9 (02.02.22), #10 (03.30.22)  - s/p PRP OS (04.09.21) --  good laser changes  -  repeat FA (09.28.21) shows late leaking MA OU, significant capillary drop-out   - exam shows scattered MA/IRH/CWS OU -- improving  - BCVA OD stable at 20/50; OS stable at 20/25  - OCT shows OD: tr non-central cystic changes, slightly improved; OS: stable improvement in trace cystic changes at 8 wks  - recommend IVA OS #11 today, 05.25.22 -- maintenance w/ f/u at 10 wks  - pt wishes to proceed with IVA OS  - RBA of procedure discussed, questions answered  - informed consent obtained, signed and scanned, 05.07.21  - see procedure note  - f/u 10 weeks -- DFE/OCT  4,5. Hypertensive retinopathy OU  - discussed importance of tight BP control  - monitor  6. Mixed form age related cataract OU  - The symptoms of cataract, surgical options, and treatments and risks were discussed with patient.  - discussed diagnosis and progression  - under the expert management of Dr. Zenia Resides  - now that macular edema is improved and relatively stable, pt is clear from a retina standpoint to proceed with cataract surgery when pt and surgeon are ready  - will send back to Dr. Zenia Resides for cataract evaluation  7. Dry eyes OU  - recommend artificial tears and lubricating ointment as needed  Ophthalmic Meds Ordered this visit:  Meds ordered this encounter  Medications  . Bevacizumab (AVASTIN) SOLN 1.25 mg      Return in about 10 weeks (around 07/24/2021) for f/u NPDR OU, DFE, OCT.  There are no Patient Instructions on file for this visit.  Explained the diagnoses, plan, and follow up with the patient and they expressed understanding.  Patient expressed understanding of the importance of proper follow up care.   This document serves as a record of services personally performed by Gardiner Sleeper, MD, PhD. It was created on their behalf by Roselee Nova, COMT. The creation of this record is the provider's dictation and/or activities during the visit.  Electronically signed by: Roselee Nova, COMT 05/16/21 11:03  PM   This document serves as a record of services personally performed by Gardiner Sleeper, MD, PhD. It was created on their behalf by San Jetty. Owens Shark, OA an ophthalmic technician. The creation of this record is the provider's dictation and/or activities during the visit.    Electronically signed by: San Jetty. Owens Shark, [place] 05.25.2022 11:03 PM  Gardiner Sleeper, M.D., Ph.D. Diseases & Surgery of the Retina and Vitreous [facility]  I have reviewed the above documentation for accuracy and completeness, and I agree with the above. Gardiner Sleeper, M.D., Ph.D. 05/16/21 11:03 PM   Abbreviations: M myopia (nearsighted); A astigmatism; H hyperopia (farsighted); P presbyopia; Mrx spectacle prescription;  CTL contact lenses; OD right eye; OS left eye; OU both eyes  XT exotropia; ET esotropia; PEK punctate epithelial keratitis; PEE punctate epithelial erosions; DES dry eye syndrome; MGD meibomian gland dysfunction; ATs artificial tears; PFAT's preservative free artificial tears; [place] nuclear sclerotic cataract; PSC posterior subcapsular cataract; ERM epi-retinal membrane; PVD posterior vitreous detachment; RD retinal detachment; DM diabetes mellitus; DR diabetic retinopathy; NPDR non-proliferative diabetic retinopathy; PDR proliferative diabetic retinopathy; CSME clinically significant macular edema; DME diabetic macular edema; dbh dot blot hemorrhages; CWS cotton wool spot; POAG primary open angle glaucoma; C/D cup-to-disc ratio; HVF humphrey visual field; GVF goldmann visual field; OCT optical coherence tomography; IOP intraocular pressure; BRVO Branch retinal vein occlusion; CRVO central retinal vein occlusion; CRAO central retinal  artery occlusion; BRAO branch retinal artery occlusion; RT retinal tear; SB scleral buckle; PPV pars plana vitrectomy; VH Vitreous hemorrhage; PRP panretinal laser photocoagulation; IVK intravitreal kenalog; VMT vitreomacular traction; MH Macular hole;  NVD  neovascularization of the disc; NVE neovascularization elsewhere; AREDS age related eye disease study; ARMD age related macular degeneration; POAG primary open angle glaucoma; EBMD epithelial/anterior basement membrane dystrophy; ACIOL anterior chamber intraocular lens; IOL intraocular lens; PCIOL posterior chamber intraocular lens; Phaco/IOL phacoemulsification with intraocular lens placement; [place] photorefractive keratectomy; LASIK laser assisted in situ keratomileusis; HTN hypertension; DM diabetes mellitus; COPD chronic obstructive pulmonary disease

## 2021-05-15 ENCOUNTER — Ambulatory Visit (INDEPENDENT_AMBULATORY_CARE_PROVIDER_SITE_OTHER): Payer: Medicare HMO | Admitting: Ophthalmology

## 2021-05-15 ENCOUNTER — Encounter (INDEPENDENT_AMBULATORY_CARE_PROVIDER_SITE_OTHER): Payer: Self-pay | Admitting: Ophthalmology

## 2021-05-15 ENCOUNTER — Other Ambulatory Visit: Payer: Self-pay

## 2021-05-15 DIAGNOSIS — I10 Essential (primary) hypertension: Secondary | ICD-10-CM

## 2021-05-15 DIAGNOSIS — H33321 Round hole, right eye: Secondary | ICD-10-CM | POA: Diagnosis not present

## 2021-05-15 DIAGNOSIS — H3581 Retinal edema: Secondary | ICD-10-CM

## 2021-05-15 DIAGNOSIS — E113313 Type 2 diabetes mellitus with moderate nonproliferative diabetic retinopathy with macular edema, bilateral: Secondary | ICD-10-CM | POA: Diagnosis not present

## 2021-05-15 DIAGNOSIS — H35033 Hypertensive retinopathy, bilateral: Secondary | ICD-10-CM

## 2021-05-15 DIAGNOSIS — H04123 Dry eye syndrome of bilateral lacrimal glands: Secondary | ICD-10-CM

## 2021-05-15 DIAGNOSIS — H25813 Combined forms of age-related cataract, bilateral: Secondary | ICD-10-CM

## 2021-05-16 ENCOUNTER — Encounter (INDEPENDENT_AMBULATORY_CARE_PROVIDER_SITE_OTHER): Payer: Self-pay | Admitting: Ophthalmology

## 2021-05-16 DIAGNOSIS — E113313 Type 2 diabetes mellitus with moderate nonproliferative diabetic retinopathy with macular edema, bilateral: Secondary | ICD-10-CM | POA: Diagnosis not present

## 2021-05-16 MED ORDER — BEVACIZUMAB CHEMO INJECTION 1.25MG/0.05ML SYRINGE FOR KALEIDOSCOPE
1.2500 mg | INTRAVITREAL | Status: AC | PRN
  Administered 2021-05-16: 1.25 mg via INTRAVITREAL

## 2021-06-06 ENCOUNTER — Encounter: Payer: Self-pay | Admitting: Neurology

## 2021-06-06 ENCOUNTER — Ambulatory Visit: Payer: PPO | Admitting: Neurology

## 2021-06-06 VITALS — BP 174/81 | HR 94 | Ht 67.0 in | Wt 197.2 lb

## 2021-06-06 DIAGNOSIS — F0150 Vascular dementia without behavioral disturbance: Secondary | ICD-10-CM | POA: Diagnosis not present

## 2021-06-06 DIAGNOSIS — R413 Other amnesia: Secondary | ICD-10-CM | POA: Diagnosis not present

## 2021-06-06 MED ORDER — MEMANTINE HCL 28 X 5 MG & 21 X 10 MG PO TABS: ORAL_TABLET | ORAL | 12 refills | Status: DC

## 2021-06-06 MED ORDER — MEMANTINE HCL 10 MG PO TABS: 10.0000 mg | ORAL_TABLET | Freq: Two times a day (BID) | ORAL | 3 refills | Status: DC

## 2021-06-06 NOTE — Patient Instructions (Signed)
I had a long discussion with the patient and her daughter regarding his memory loss and mild dementia and he seems to be tolerating Aricept well but has not had any significant cognitive improvement.  I recommend adding Namenda starter pack and if tolerated without side effects then 10 mg twice daily.  I discussed possible side effects with the patient and daughter and nursing to call me if needed.  I also recommend he restart aspirin 325 mg daily for stroke prevention and maintain aggressive risk factor modification with strict control of hypertension with blood pressure goal below 130/90, lipids with LDL cholesterol goal below 70 mg percent and diabetes with hemoglobin A1c goal below 6.5%.  She will return for follow-up in the future in 3 months or call earlier if necessary. Memantine Tablets What is this medication? MEMANTINE (MEM an teen) is used to treat dementia caused by Alzheimer's disease. This medicine may be used for other purposes; ask your health care provider orpharmacist if you have questions. COMMON BRAND NAME(S): Namenda What should I tell my care team before I take this medication? They need to know if you have any of these conditions: difficulty passing urine kidney disease liver disease seizures an unusual or allergic reaction to memantine, other medicines, foods, dyes, or preservatives pregnant or trying to get pregnant breast-feeding How should I use this medication? Take this medicine by mouth with a glass of water. Follow the directions on the prescription label. You may take this medicine with or without food. Take your doses at regular intervals. Do not take your medicine more often than directed. Continue to take your medicine even if you feel better. Do not stop takingexcept on the advice of your doctor or health care professional. Talk to your pediatrician regarding the use of this medicine in children.Special care may be needed. Overdosage: If you think you have taken  too much of this medicine contact apoison control center or emergency room at once. NOTE: This medicine is only for you. Do not share this medicine with others. What if I miss a dose? If you miss a dose, take it as soon as you can. If it is almost time for your next dose, take only that dose. Do not take double or extra doses. If you do not take your medicine for several days, contact your health care provider.Your dose may need to be changed. What may interact with this medication? acetazolamide amantadine cimetidine dextromethorphan dofetilide hydrochlorothiazide ketamine metformin methazolamide quinidine ranitidine sodium bicarbonate triamterene This list may not describe all possible interactions. Give your health care provider a list of all the medicines, herbs, non-prescription drugs, or dietary supplements you use. Also tell them if you smoke, drink alcohol, or use illegaldrugs. Some items may interact with your medicine. What should I watch for while using this medication? Visit your doctor or health care professional for regular checks on your progress. Check with your doctor or health care professional if there is noimprovement in your symptoms or if they get worse. You may get drowsy or dizzy. Do not drive, use machinery, or do anything that needs mental alertness until you know how this drug affects you. Do not stand or sit up quickly, especially if you are an older patient. This reduces the risk of dizzy or fainting spells. Alcohol can make you more drowsy and dizzy.Avoid alcoholic drinks. What side effects may I notice from receiving this medication? Side effects that you should report to your doctor or health care professionalas soon as possible:  allergic reactions like skin rash, itching or hives, swelling of the face, lips, or tongue agitation or a feeling of restlessness depressed mood dizziness hallucinations redness, blistering, peeling or loosening of the skin,  including inside the mouth seizures vomiting Side effects that usually do not require medical attention (report to yourdoctor or health care professional if they continue or are bothersome): constipation diarrhea headache nausea trouble sleeping This list may not describe all possible side effects. Call your doctor for medical advice about side effects. You may report side effects to FDA at1-800-FDA-1088. Where should I keep my medication? Keep out of the reach of children. Store at room temperature between 15 degrees and 30 degrees C (59 degrees and86 degrees F). Throw away any unused medicine after the expiration date. NOTE: This sheet is a summary. It may not cover all possible information. If you have questions about this medicine, talk to your doctor, pharmacist, orhealth care provider.  2022 Elsevier/Gold Standard (2013-09-26 14:10:42)
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
## 2021-06-28 ENCOUNTER — Ambulatory Visit: Payer: Medicare HMO | Admitting: Podiatry

## 2021-06-30 ENCOUNTER — Other Ambulatory Visit: Payer: Self-pay | Admitting: Neurology

## 2021-07-02 ENCOUNTER — Other Ambulatory Visit: Payer: Self-pay | Admitting: Neurology

## 2021-07-12 ENCOUNTER — Ambulatory Visit: Payer: Medicare HMO | Admitting: Podiatry

## 2021-07-24 ENCOUNTER — Other Ambulatory Visit: Payer: Self-pay

## 2021-07-24 ENCOUNTER — Ambulatory Visit (INDEPENDENT_AMBULATORY_CARE_PROVIDER_SITE_OTHER): Payer: Medicare HMO | Admitting: Ophthalmology

## 2021-07-24 ENCOUNTER — Encounter (INDEPENDENT_AMBULATORY_CARE_PROVIDER_SITE_OTHER): Payer: Medicare HMO | Admitting: Ophthalmology

## 2021-07-24 ENCOUNTER — Encounter (INDEPENDENT_AMBULATORY_CARE_PROVIDER_SITE_OTHER): Payer: Self-pay | Admitting: Ophthalmology

## 2021-07-24 DIAGNOSIS — H25813 Combined forms of age-related cataract, bilateral: Secondary | ICD-10-CM

## 2021-07-24 DIAGNOSIS — I10 Essential (primary) hypertension: Secondary | ICD-10-CM

## 2021-07-24 DIAGNOSIS — H04123 Dry eye syndrome of bilateral lacrimal glands: Secondary | ICD-10-CM

## 2021-07-24 DIAGNOSIS — H33321 Round hole, right eye: Secondary | ICD-10-CM

## 2021-07-24 DIAGNOSIS — H35033 Hypertensive retinopathy, bilateral: Secondary | ICD-10-CM

## 2021-07-24 DIAGNOSIS — H3581 Retinal edema: Secondary | ICD-10-CM

## 2021-07-24 DIAGNOSIS — E113313 Type 2 diabetes mellitus with moderate nonproliferative diabetic retinopathy with macular edema, bilateral: Secondary | ICD-10-CM

## 2021-07-24 MED ORDER — BEVACIZUMAB CHEMO INJECTION 1.25MG/0.05ML SYRINGE FOR KALEIDOSCOPE
1.2500 mg | INTRAVITREAL | Status: AC | PRN
  Administered 2021-07-24: 1.25 mg via INTRAVITREAL

## 2021-07-24 NOTE — Progress Notes (Signed)
[facility] Note  07/24/2021     CHIEF COMPLAINT Patient presents for Retina Follow Up   HISTORY OF PRESENT ILLNESS: [name] is a 70 y.o. male who presents to the clinic today for:   HPI     Retina Follow Up   Patient presents with  Diabetic Retinopathy.  In both eyes.  Duration of 10 weeks.  Since onset it is stable.  I, the attending physician,  performed the HPI with the patient and updated documentation appropriately.        Comments   10 week follow up NPDR OU- Vision appears stable.  At times eyes will get watery and itchy.  Patient states when he is walking his dog he will see black floaters.  Unsure which eye.  Denies increase in floaters, FOLs, or changes in vision.  Unsure BS and A1C.       Last edited by [name], MD on 07/24/2021 10:43 AM.    pt states vision is stable, he states he is seeing occasional "black spots"   Referring physician: Sonia Side., FNP [place],  [place] [address]  HISTORICAL INFORMATION:   Selected notes from the MEDICAL RECORD NUMBER Referred by Dr. Wyatt Portela for concern of HTN Ret   CURRENT MEDICATIONS: Current Outpatient Medications (Ophthalmic Drugs)  Medication Sig   Polyvinyl Alcohol-Povidone (REFRESH OP) Place 1 drop into both eyes in the morning and at bedtime.   No current facility-administered medications for this visit. (Ophthalmic Drugs)   Current Outpatient Medications (Other)  Medication Sig   aspirin 325 MG EC tablet aspirin 325 mg tablet,delayed release   carvedilol (COREG) 6.25 MG tablet Take 6.25 mg by mouth 2 (two) times daily with a meal.   donepezil (ARICEPT) 10 MG tablet Take 1 tablet (10 mg total) by mouth at bedtime.   hydrochlorothiazide (HYDRODIURIL) 25 MG tablet Take 25 mg by mouth daily.   losartan (COZAAR) 100 MG tablet Take 100 mg by mouth daily.   lovastatin (MEVACOR) 10 MG tablet Take 1 tablet by mouth daily.   memantine (NAMENDA) 10 MG  tablet Take 1 tablet (10 mg total) by mouth 2 (two) times daily. Start only after finishing starter pack first   metFORMIN (GLUCOPHAGE) 500 MG tablet Take by mouth 2 (two) times daily with a meal.   Multiple Vitamins-Minerals (ONE-A-DAY MENS 50+ PO) Take 1 tablet by mouth daily.   PFIZER-BIONT COVID-19 VAC-TRIS SUSP injection    Sodium Chloride Flush (NORMAL SALINE FLUSH) 0.9 % SOLN sodium chloride 0.9 % intravenous solution   500 mL by intraven. route.   Sodium Sulfate-Mag Sulfate-KCl (SUTAB) [phone] MG TABS Sutab 1.479-0.188-0.225 gram tablet   spironolactone (ALDACTONE) 25 MG tablet SMARTSIG:0.5 Tablet(s) By Mouth Every Other Day   atorvastatin (LIPITOR) 40 MG tablet Take 1 tablet (40 mg total) by mouth daily at 6 PM.   hydrALAZINE (APRESOLINE) 50 MG tablet Take 50 mg by mouth 2 (two) times daily.   losartan (COZAAR) 50 MG tablet    memantine (NAMENDA TITRATION PAK) tablet pack 5 mg/day for =1 week; 5 mg twice daily for =1 week; 15 mg/day given in 5 mg and 10 mg separated doses for =1 week; then 10 mg twice daily   PFIZER-BIONTECH COVID-19 VACC 30 MCG/0.3ML injection    traMADol (ULTRAM) 50 MG tablet Take 50 mg by mouth every 6 (six) hours as needed.   Current Facility-Administered Medications (Other)  Medication Route   0.9 %  sodium chloride infusion Intravenous   0.9 %  sodium chloride infusion Intravenous      REVIEW OF SYSTEMS: ROS   Positive for: Neurological, Endocrine, Eyes Negative for: Constitutional, Gastrointestinal, Skin, Genitourinary, Musculoskeletal, HENT, Cardiovascular, Respiratory, Psychiatric, Allergic/Imm, Heme/Lymph Last edited by Leonie Douglas, COA on 07/24/2021  9:08 AM.       ALLERGIES Allergies  Allergen Reactions   Shellfish Allergy Anaphylaxis    PAST MEDICAL HISTORY Past Medical History:  Diagnosis Date   Acute cerebrovascular accident (CVA) ([place]) 01/20/2020   Benign essential HTN 01/20/2020   Cataract    Mixed form OU   Diabetic  retinopathy ([place])    NPDR OU   Hypertensive retinopathy    OU   Seizures ([place])    per pt- "none since teenage years"   Past Surgical History:  Procedure Laterality Date   LOOP RECORDER INSERTION N/A 01/23/2020   Procedure: LOOP RECORDER INSERTION;  Surgeon: Deboraha Sprang, MD;  Location: [facility];  Service: Cardiovascular;  Laterality: N/A;    FAMILY HISTORY Family History  Problem Relation Age of Onset   COPD Mother    Diabetes Mother    Dementia Father    Diabetes Father    Cancer Father        prostate and lung   Colon cancer Neg Hx    Esophageal cancer Neg Hx    Stomach cancer Neg Hx    Rectal cancer Neg Hx     SOCIAL HISTORY Social History   Tobacco Use   Smoking status: Never   Smokeless tobacco: Never  Vaping Use   Vaping Use: Never used  Substance Use Topics   Alcohol use: Never   Drug use: Never         OPHTHALMIC EXAM:  Base Eye Exam     Visual Acuity (Snellen - Linear)       Right Left   Dist [place] 20/100- 20/50   Dist ph [place] 20/40 20/25-         Tonometry (Tonopen, 9:20 AM)       Right Left   Pressure 14 15         Pupils       Dark Light Shape React APD   Right 3 2 Round Minimal None   Left 3 2 Round Minimal None         Visual Fields (Counting fingers)       Left Right    Full Full         Extraocular Movement       Right Left    Full Full         Neuro/Psych     Oriented x3: Yes   Mood/Affect: Normal         Dilation     Both eyes: 1.0% Mydriacyl, 2.5% Phenylephrine @ 9:20 AM           Slit Lamp and Fundus Exam     Slit Lamp Exam       Right Left   Lids/Lashes Dermatochalasis - upper lid Dermatochalasis - upper lid   Conjunctiva/Sclera Nasal and temporal Pinguecula, mild Melanosis,  Nasal and temporal Pinguecula, mild Melanosis, Subconjunctival hemorrhage nasally and inferiorly   Cornea Arcus, 2-3+ Punctate epithelial erosions, Debris in tear film Mild Arcus, 3+ Punctate epithelial  erosions, Debris in tear film   Anterior Chamber Deep and quiet Deep and quiet   Iris Round and dilated, No NVI Round and dilated, No NVI  Lens 2-3+ Nuclear sclerosis, 2-3+ Cortical cataract 2-3+ Nuclear sclerosis, 2-3+ Cortical cataract   Vitreous Vitreous syneresis Vitreous syneresis, Posterior vitreous detachment         Fundus Exam       Right Left   Disc sharp rim, mild pallor, +cupping Pink and Sharp   C/D Ratio 0.6 0.5   Macula Flat, good foveal reflex, scattered MA/DBH greatest superior mac, prominent dot heme along IT arcades, scattered, trace cystic changes (non-central), No frank edema Flat, good foveal reflex, scattered Microaneurysms and exudate--improving, trace cystic changes IT macula   Vessels Vascular attenuation, Tortuous, severe attenuation temporal periphery  Vascular attenuation, Tortuous, severe attenuation temporal perhiphery   Periphery Attached, operculated hole at 1000 with partial pigment and +cuff of SRF--good laser changes surrounding, scattered MA, focal DBH nasal to disc, White without pressure temporal periphery, blot hemes nasal to disc and inferior periphery Attached, scattered IRH and CWS, White without pressure temporally, focal pigmented CR scar at 0130 equator, good peripheral 360 PRP            IMAGING AND PROCEDURES  Imaging and Procedures for _0 @  OCT, Retina - OU - Both Eyes       Right Eye Quality was good. Central Foveal Thickness: 230. Progression has been stable. Findings include normal foveal contour, no SRF, no IRF (Non-central cystic changes - persistent, mild diffuse retinal thinning -- stable).   Left Eye Quality was good. Central Foveal Thickness: 240. Progression has been stable. Findings include normal foveal contour, intraretinal hyper-reflective material, no SRF, vitreomacular adhesion , intraretinal fluid (Trace persistent cystic changes IT mac).   Notes *Images captured and stored on drive  Diagnosis / Impression:   OD: NFP, no SRF -- Non-central cystic changes - persistent, mild diffuse retinal thinning -- stable OS: NFP, no SRF/IRF, Trace persistent cystic changes IT mac   Clinical management:  See below  Abbreviations: NFP - Normal foveal profile. CME - cystoid macular edema. PED - pigment epithelial detachment. IRF - intraretinal fluid. SRF - subretinal fluid. EZ - ellipsoid zone. ERM - epiretinal membrane. ORA - outer retinal atrophy. ORT - outer retinal tubulation. SRHM - subretinal hyper-reflective material      Intravitreal Injection, Pharmacologic Agent - OS - Left Eye       Time Out 07/24/2021. 9:56 AM. Confirmed correct patient, procedure, site, and patient consented.   Anesthesia Topical anesthesia was used. Anesthetic medications included Lidocaine 2%, Proparacaine 0.5%.   Procedure Preparation included 5% betadine to ocular surface, eyelid speculum. A (32g) needle was used.   Injection: 1.25 mg Bevacizumab 1.7m/0.05ml   Route: Intravitreal, Site: Left Eye   NDC: [id number] Lot:: [id number] Expiration date: 09/10/2021, Waste: 0.05 mL   Post-op Post injection exam found visual acuity of at least counting fingers. The patient tolerated the procedure well. There were no complications. The patient received written and verbal post procedure care education.               ASSESSMENT/PLAN:    ICD-10-CM   1. Moderate nonproliferative diabetic retinopathy of both eyes with macular edema associated with type 2 diabetes mellitus (HCC)  EG92.1194Intravitreal Injection, Pharmacologic Agent - OS - Left Eye    Bevacizumab (AVASTIN) SOLN 1.25 mg    2. Retinal edema  H35.81 OCT, Retina - OU - Both Eyes    3. Retinal hole of right eye  H33.321     4. Essential hypertension  I10     5. Hypertensive retinopathy of both eyes  H35.033     6. Combined forms of age-related cataract of both eyes  H25.813     7. Dry eyes  H04.123       1,2. Moderate non-proliferative diabetic  retinopathy, OU  - s/p IVA OS #1 (05.07.21), #2 (06.04.21), #3 (07.06.21), #4 (08.03.21), #5 (08.31.21), #6 (9.28.21), #7 (11.2.21), #8 (12.07.21), #9 (02.02.22), #10 (03.30.22)  - s/p PRP OS (04.09.21) -- good laser changes  - repeat FA (09.28.21) shows late leaking MA OU, significant capillary drop-out   - exam shows scattered MA/IRH/CWS OU -- improving  - BCVA OD stable at 20/50; OS stable at 20/25  - OCT shows OD: Non-central cystic changes - persistent, mild diffuse retinal thinning -- stable; OS: Trace persistent cystic changes IT mac at 10 weeks  - recommend IVA OS #11 today, 05.25.22 -- maintenance w/ f/u at 10 wks  - pt wishes to proceed with IVA OS  - RBA of procedure discussed, questions answered  - informed consent obtained, signed and scanned, 05.07.21  - see procedure note  - f/u 10 weeks -- DFE/OCT  3. Operculated retinal hole w/ cuff of SRF / focal RD, right eye  - operculated hole located at 1000 with partial pigment and +cuff of SRF / focal RD  - s/p retinopexy OD (03.17.21) -- good laser changes surrounding  - monitor  4,5. Hypertensive retinopathy OU  - discussed importance of tight BP control  - monitor  6. Mixed form age related cataract OU  - The symptoms of cataract, surgical options, and treatments and risks were discussed with patient.  - discussed diagnosis and progression  - under the expert management of Dr. Zenia Resides  - now that macular edema is improved and relatively stable, pt is clear from a retina standpoint to proceed with cataract surgery when pt and surgeon are ready  - will send back to Dr. Zenia Resides for cataract evaluation  7. Dry eyes OU  - recommend artificial tears and lubricating ointment as needed  Ophthalmic Meds Ordered this visit:  Meds ordered this encounter  Medications   Bevacizumab (AVASTIN) SOLN 1.25 mg       Return in about 10 weeks (around 10/02/2021) for f/u NPDR OU, DFE, OCT.  There are no Patient Instructions on file  for this visit.  Explained the diagnoses, plan, and follow up with the patient and they expressed understanding.  Patient expressed understanding of the importance of proper follow up care.   This document serves as a record of services personally performed by Gardiner Sleeper, MD, PhD. It was created on their behalf by San Jetty. Owens Shark, OA an ophthalmic technician. The creation of this record is the provider's dictation and/or activities during the visit.    Electronically signed by: San Jetty. Owens Shark, [place] 08.03.2022 10:50 AM  Gardiner Sleeper, M.D., Ph.D. Diseases & Surgery of the Retina and Vitreous [facility]  I have reviewed the above documentation for accuracy and completeness, and I agree with the above. Gardiner Sleeper, M.D., Ph.D. 07/24/21 10:50 AM   Abbreviations: M myopia (nearsighted); A astigmatism; H hyperopia (farsighted); P presbyopia; Mrx spectacle prescription;  CTL contact lenses; OD right eye; OS left eye; OU both eyes  XT exotropia; ET esotropia; PEK punctate epithelial keratitis; PEE punctate epithelial erosions; DES dry eye syndrome; MGD meibomian gland dysfunction; ATs artificial tears; PFAT's preservative free artificial tears; [place] nuclear sclerotic cataract; PSC posterior subcapsular cataract; ERM epi-retinal membrane; PVD posterior vitreous detachment; RD retinal  detachment; DM diabetes mellitus; DR diabetic retinopathy; NPDR non-proliferative diabetic retinopathy; PDR proliferative diabetic retinopathy; CSME clinically significant macular edema; DME diabetic macular edema; dbh dot blot hemorrhages; CWS cotton wool spot; POAG primary open angle glaucoma; C/D cup-to-disc ratio; HVF humphrey visual field; GVF goldmann visual field; OCT optical coherence tomography; IOP intraocular pressure; BRVO Branch retinal vein occlusion; CRVO central retinal vein occlusion; CRAO central retinal artery occlusion; BRAO branch retinal artery occlusion; RT retinal tear; SB  scleral buckle; PPV pars plana vitrectomy; VH Vitreous hemorrhage; PRP panretinal laser photocoagulation; IVK intravitreal kenalog; VMT vitreomacular traction; MH Macular hole;  NVD neovascularization of the disc; NVE neovascularization elsewhere; AREDS age related eye disease study; ARMD age related macular degeneration; POAG primary open angle glaucoma; EBMD epithelial/anterior basement membrane dystrophy; ACIOL anterior chamber intraocular lens; IOL intraocular lens; PCIOL posterior chamber intraocular lens; Phaco/IOL phacoemulsification with intraocular lens placement; [place] photorefractive keratectomy; LASIK laser assisted in situ keratomileusis; HTN hypertension; DM diabetes mellitus; COPD chronic obstructive pulmonary disease

## 2021-08-02 ENCOUNTER — Encounter: Payer: Self-pay | Admitting: Gastroenterology

## 2021-09-12 ENCOUNTER — Ambulatory Visit: Payer: Medicare HMO | Admitting: Adult Health

## 2021-09-12 ENCOUNTER — Encounter: Payer: Self-pay | Admitting: Adult Health

## 2021-09-12 VITALS — BP 168/72 | HR 71 | Ht 67.0 in | Wt 211.0 lb

## 2021-09-12 DIAGNOSIS — F0150 Vascular dementia without behavioral disturbance: Secondary | ICD-10-CM

## 2021-09-12 DIAGNOSIS — I639 Cerebral infarction, unspecified: Secondary | ICD-10-CM | POA: Diagnosis not present

## 2021-09-12 MED ORDER — MEMANTINE HCL 10 MG PO TABS: 10.0000 mg | ORAL_TABLET | Freq: Two times a day (BID) | ORAL | 4 refills | Status: DC

## 2021-09-12 NOTE — Patient Instructions (Signed)
Continue aspirin 325 mg daily  and atorvastatin  for secondary stroke prevention  Continue to follow up with PCP regarding cholesterol and blood pressure management  Maintain strict control of hypertension with blood pressure goal below 130/90 and cholesterol with LDL cholesterol (bad cholesterol) goal below 70 mg/dL.   Continue Namenda and Aricept    Followup in the future with me in 6 months or call earlier if needed       Thank you for coming to see [place] at [facility]. I hope we have been able to provide you high quality care today.  You may receive a patient satisfaction survey over the next few weeks. We would appreciate your feedback and comments so that we may continue to improve ourselves and the health of our patients.
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
## 2021-09-23 ENCOUNTER — Other Ambulatory Visit: Payer: Self-pay | Admitting: Neurology

## 2021-10-02 ENCOUNTER — Encounter (INDEPENDENT_AMBULATORY_CARE_PROVIDER_SITE_OTHER): Payer: Medicare HMO | Admitting: Ophthalmology

## 2021-10-02 DIAGNOSIS — H33321 Round hole, right eye: Secondary | ICD-10-CM

## 2021-10-02 DIAGNOSIS — H04123 Dry eye syndrome of bilateral lacrimal glands: Secondary | ICD-10-CM

## 2021-10-02 DIAGNOSIS — E113313 Type 2 diabetes mellitus with moderate nonproliferative diabetic retinopathy with macular edema, bilateral: Secondary | ICD-10-CM

## 2021-10-02 DIAGNOSIS — H35033 Hypertensive retinopathy, bilateral: Secondary | ICD-10-CM

## 2021-10-02 DIAGNOSIS — H25813 Combined forms of age-related cataract, bilateral: Secondary | ICD-10-CM

## 2021-10-02 DIAGNOSIS — H3581 Retinal edema: Secondary | ICD-10-CM

## 2021-10-02 DIAGNOSIS — I10 Essential (primary) hypertension: Secondary | ICD-10-CM

## 2021-10-03 NOTE — Progress Notes (Addendum)
[facility] Note  10/10/2021     CHIEF COMPLAINT Patient presents for Retina Follow Up   HISTORY OF PRESENT ILLNESS: [name] is a 70 y.o. male who presents to the clinic today for:   HPI     Retina Follow Up   Patient presents with  Diabetic Retinopathy.  In both eyes.  This started years ago.  Severity is moderate.  Duration of 11 weeks.  Since onset it is stable.  I, the attending physician,  performed the HPI with the patient and updated documentation appropriately.        Comments   70 y/o male pt here for 11 wk f/u for mod NPDR w/DME OU.  No change in [place] OU.  Denies pain, FOL, new floaters.  No gtts.      Last edited by [name], MD on 10/10/2021 10:51 AM.    pt states vision is stable, he states he is seeing occasional "black spots"  Referring physician: [name], MD [address] [place],  [place] [address]  HISTORICAL INFORMATION:   Selected notes from the MEDICAL RECORD NUMBER Referred by Dr. Wyatt Portela for concern of HTN Ret   CURRENT MEDICATIONS: Current Outpatient Medications (Ophthalmic Drugs)  Medication Sig   Polyvinyl Alcohol-Povidone (REFRESH OP) Place 1 drop into both eyes in the morning and at bedtime.   No current facility-administered medications for this visit. (Ophthalmic Drugs)   Current Outpatient Medications (Other)  Medication Sig   aspirin 325 MG EC tablet aspirin 325 mg tablet,delayed release   atorvastatin (LIPITOR) 40 MG tablet Take 1 tablet (40 mg total) by mouth daily at 6 PM.   carvedilol (COREG) 6.25 MG tablet Take 6.25 mg by mouth 2 (two) times daily with a meal.   donepezil (ARICEPT) 10 MG tablet Take 1 tablet (10 mg total) by mouth at bedtime.   hydrochlorothiazide (HYDRODIURIL) 25 MG tablet Take 25 mg by mouth daily.   losartan (COZAAR) 100 MG tablet Take 100 mg by mouth daily.   losartan (COZAAR) 50 MG tablet    lovastatin (MEVACOR) 10 MG tablet Take 1 tablet by  mouth daily.   memantine (NAMENDA) 10 MG tablet TAKE 1 TABLET TWICE DAILY   metFORMIN (GLUCOPHAGE) 500 MG tablet Take by mouth 2 (two) times daily with a meal.   Multiple Vitamins-Minerals (ONE-A-DAY MENS 50+ PO) Take 1 tablet by mouth daily.   PFIZER-BIONT COVID-19 VAC-TRIS SUSP injection    PFIZER-BIONTECH COVID-19 VACC 30 MCG/0.3ML injection    Sodium Chloride Flush (NORMAL SALINE FLUSH) 0.9 % SOLN sodium chloride 0.9 % intravenous solution   500 mL by intraven. route.   Sodium Sulfate-Mag Sulfate-KCl (SUTAB) [id number] MG TABS Sutab 1.479-0.188-0.225 gram tablet   spironolactone (ALDACTONE) 25 MG tablet SMARTSIG:0.5 Tablet(s) By Mouth Every Other Day   Current Facility-Administered Medications (Other)  Medication Route   0.9 %  sodium chloride infusion Intravenous   0.9 %  sodium chloride infusion Intravenous   REVIEW OF SYSTEMS: ROS   Positive for: Endocrine, Eyes Negative for: Constitutional, Gastrointestinal, Neurological, Skin, Genitourinary, Musculoskeletal, HENT, Cardiovascular, Respiratory, Psychiatric, Allergic/Imm, Heme/Lymph Last edited by Matthew Folks, COA on 10/10/2021  8:04 AM.    ALLERGIES Allergies  Allergen Reactions   Shellfish Allergy Anaphylaxis   PAST MEDICAL HISTORY Past Medical History:  Diagnosis Date   Acute cerebrovascular accident (CVA) ([place]) 01/20/2020   Benign essential HTN 01/20/2020   Cataract    Mixed form OU  Diabetic retinopathy ([place])    NPDR OU   Hypertensive retinopathy    OU   Seizures ([place])    per pt- "none since teenage years"   Past Surgical History:  Procedure Laterality Date   LOOP RECORDER INSERTION N/A 01/23/2020   Procedure: LOOP RECORDER INSERTION;  Surgeon: Deboraha Sprang, MD;  Location: [facility];  Service: Cardiovascular;  Laterality: N/A;   FAMILY HISTORY Family History  Problem Relation Age of Onset   COPD Mother    Diabetes Mother    Dementia Father    Diabetes Father    Cancer Father         prostate and lung   Colon cancer Neg Hx    Esophageal cancer Neg Hx    Stomach cancer Neg Hx    Rectal cancer Neg Hx     SOCIAL HISTORY Social History   Tobacco Use   Smoking status: Never   Smokeless tobacco: Never  Vaping Use   Vaping Use: Never used  Substance Use Topics   Alcohol use: Never   Drug use: Never       OPHTHALMIC EXAM: Base Eye Exam     Visual Acuity (Snellen - Linear)       Right Left   Dist [place] 20/80 - 20/30 -2   Dist ph [place] 20/40 -2 20/25 -2         Tonometry (Tonopen, 8:06 AM)       Right Left   Pressure 15 15         Pupils       Dark Light Shape React APD   Right 3 2 Round Minimal None   Left 3 2 Round Minimal None         Visual Fields (Counting fingers)       Left Right    Full Full         Extraocular Movement       Right Left    Full, Ortho Full, Ortho         Neuro/Psych     Oriented x3: Yes   Mood/Affect: Normal         Dilation     Both eyes: 1.0% Mydriacyl, 2.5% Phenylephrine @ 8:06 AM           Slit Lamp and Fundus Exam     Slit Lamp Exam       Right Left   Lids/Lashes Dermatochalasis - upper lid Dermatochalasis - upper lid   Conjunctiva/Sclera Nasal and temporal Pinguecula, mild Melanosis,  Nasal and temporal Pinguecula, mild Melanosis, Subconjunctival hemorrhage nasally and inferiorly   Cornea Arcus, 2-3+ Punctate epithelial erosions, Debris in tear film Mild Arcus, 3+ Punctate epithelial erosions, Debris in tear film   Anterior Chamber Deep and quiet Deep and quiet   Iris Round and dilated, No NVI Round and dilated, No NVI   Lens 2-3+ Nuclear sclerosis, 2-3+ Cortical cataract 2-3+ Nuclear sclerosis, 2-3+ Cortical cataract   Vitreous Vitreous syneresis Vitreous syneresis, Posterior vitreous detachment         Fundus Exam       Right Left   Disc sharp rim, mild pallor, +cupping Sharp, mild Pallor   C/D Ratio 0.6 0.5   Macula Flat, good foveal reflex, scattered MA/DBH greatest superior  mac -- improved, prominent dot heme along IT arcades, scattered, trace cystic changes (non-central) -- improving, No frank edema Flat, good foveal reflex, scattered Microaneurysms and exudate--improving, trace cystic changes IT macula -- improving  Vessels Vascular attenuation, Tortuous, severe attenuation temporal periphery  Vascular attenuation, Tortuous, severe attenuation temporal perhiphery   Periphery Attached, operculated hole at 1000 with partial pigment and +cuff of SRF--good laser changes surrounding, scattered MA, White without pressure temporal periphery Attached, scattered IRH and CWS, White without pressure temporally, focal pigmented CR scar at 0130 equator, good peripheral 360 PRP           IMAGING AND PROCEDURES  Imaging and Procedures for _0 @  OCT, Retina - OU - Both Eyes       Right Eye Quality was good. Central Foveal Thickness: 231. Progression has been stable. Findings include normal foveal contour, no SRF, no IRF (mild diffuse retinal thinning -- stable).   Left Eye Quality was good. Central Foveal Thickness: 236. Progression has improved. Findings include normal foveal contour, intraretinal hyper-reflective material, no SRF, vitreomacular adhesion , intraretinal fluid (Trace persistent cystic changes IT mac -- slightly improved).   Notes *Images captured and stored on drive  Diagnosis / Impression:  OD: NFP, no IRF/SRF -- Mild diffuse retinal thinning -- stable OS: NFP, no SRF, Trace persistent cystic changes IT mac -- slightly improved   Clinical management:  See below  Abbreviations: NFP - Normal foveal profile. CME - cystoid macular edema. PED - pigment epithelial detachment. IRF - intraretinal fluid. SRF - subretinal fluid. EZ - ellipsoid zone. ERM - epiretinal membrane. ORA - outer retinal atrophy. ORT - outer retinal tubulation. SRHM - subretinal hyper-reflective material      Intravitreal Injection, Pharmacologic Agent - OS - Left Eye        Time Out 10/10/2021. 9:11 AM. Confirmed correct patient, procedure, site, and patient consented.   Anesthesia Topical anesthesia was used. Anesthetic medications included Lidocaine 2%, Proparacaine 0.5%.   Procedure Preparation included 5% betadine to ocular surface, eyelid speculum. A supplied needle was used.   Injection: 1.25 mg Bevacizumab 1.34m/0.05ml   Route: Intravitreal, Site: Left Eye   NDC:: 46270-350-09 Lot: [id number] , Expiration date: 11/27/2021, Waste: 0 mL   Post-op Post injection exam found visual acuity of at least counting fingers. The patient tolerated the procedure well. There were no complications. The patient received written and verbal post procedure care education.            ASSESSMENT/PLAN:    ICD-10-CM   1. Moderate nonproliferative diabetic retinopathy of both eyes with macular edema associated with type 2 diabetes mellitus (HCC)  EF81.8299Intravitreal Injection, Pharmacologic Agent - OS - Left Eye    Bevacizumab (AVASTIN) SOLN 1.25 mg    2. Retinal edema  H35.81 OCT, Retina - OU - Both Eyes    3. Retinal hole of right eye  H33.321     4. Essential hypertension  I10     5. Hypertensive retinopathy of both eyes  H35.033     6. Combined forms of age-related cataract of both eyes  H25.813     7. Dry eyes  H04.123      1,2. Moderate non-proliferative diabetic retinopathy, OU  - f/u delayed to 11 wks instead of 10  - s/p IVA OS #1 (05.07.21), #2 (06.04.21), #3 (07.06.21), #4 (08.03.21), #5 (08.31.21), #6 (9.28.21), #7 (11.2.21), #8 (12.07.21), #9 (02.02.22), #10 (03.30.22), #11 (05.25.22), #12 (08.03.22)  - s/p PRP OS (04.09.21) -- good laser changes  - repeat FA (09.28.21) shows late leaking MA OU, significant capillary drop-out   - exam shows scattered MA/IRH/CWS OU -- improving  - BCVA OD stable at 20/40; OS stable at 20/25  -  OCT shows OD: mild diffuse retinal thinning -- stable; OS: Trace persistent cystic changes IT mac at 10 weeks, will  extend to 12 weeks  - recommend IVA OS #13 today, 10.20.22 -- will extend w/ f/u at 12 wks  - pt wishes to proceed with IVA OS  - RBA of procedure discussed, questions answered  - informed consent obtained, signed and scanned, 05.07.21  - see procedure note  - f/u 12 weeks -- DFE/OCT  3. Operculated retinal hole w/ cuff of SRF / focal RD, right eye  - operculated hole located at 1000 with partial pigment and +cuff of SRF / focal RD  - s/p retinopexy OD (03.17.21) -- good laser changes surrounding  - monitor  4,5. Hypertensive retinopathy OU  - discussed importance of tight BP control  - monitor  6. Mixed form age related cataract OU  - The symptoms of cataract, surgical options, and treatments and risks were discussed with patient.  - discussed diagnosis and progression  - under the expert management of Dr. Zenia Resides  - now that macular edema is improved and relatively stable, pt is clear from a retina standpoint to proceed with cataract surgery when pt and surgeon are ready  - will send back to Dr. Zenia Resides for cataract evaluation  7. Dry eyes OU  - recommend artificial tears and lubricating ointment as needed  Ophthalmic Meds Ordered this visit:  Meds ordered this encounter  Medications   Bevacizumab (AVASTIN) SOLN 1.25 mg     Return for 12 week follow up , DFE, OCT, possible injection.  There are no Patient Instructions on file for this visit.  Explained the diagnoses, plan, and follow up with the patient and they expressed understanding.  Patient expressed understanding of the importance of proper follow up care.   This document serves as a record of services personally performed by Gardiner Sleeper, MD, PhD. It was created on their behalf by San Jetty. Owens Shark, OA an ophthalmic technician. The creation of this record is the provider's dictation and/or activities during the visit.    Electronically signed by: San Jetty. Owens Shark, [place] 10.13.2022 11:25 PM  Gardiner Sleeper, M.D.,  Ph.D. Diseases & Surgery of the Retina and Vitreous [facility]  I have reviewed the above documentation for accuracy and completeness, and I agree with the above. Gardiner Sleeper, M.D., Ph.D. 10/10/21 11:25 PM  Abbreviations: M myopia (nearsighted); A astigmatism; H hyperopia (farsighted); P presbyopia; Mrx spectacle prescription;  CTL contact lenses; OD right eye; OS left eye; OU both eyes  XT exotropia; ET esotropia; PEK punctate epithelial keratitis; PEE punctate epithelial erosions; DES dry eye syndrome; MGD meibomian gland dysfunction; ATs artificial tears; PFAT's preservative free artificial tears; [place] nuclear sclerotic cataract; PSC posterior subcapsular cataract; ERM epi-retinal membrane; PVD posterior vitreous detachment; RD retinal detachment; DM diabetes mellitus; DR diabetic retinopathy; NPDR non-proliferative diabetic retinopathy; PDR proliferative diabetic retinopathy; CSME clinically significant macular edema; DME diabetic macular edema; dbh dot blot hemorrhages; CWS cotton wool spot; POAG primary open angle glaucoma; C/D cup-to-disc ratio; HVF humphrey visual field; GVF goldmann visual field; OCT optical coherence tomography; IOP intraocular pressure; BRVO Branch retinal vein occlusion; CRVO central retinal vein occlusion; CRAO central retinal artery occlusion; BRAO branch retinal artery occlusion; RT retinal tear; SB scleral buckle; PPV pars plana vitrectomy; VH Vitreous hemorrhage; PRP panretinal laser photocoagulation; IVK intravitreal kenalog; VMT vitreomacular traction; MH Macular hole;  NVD neovascularization of the disc; NVE neovascularization elsewhere; AREDS age  related eye disease study; ARMD age related macular degeneration; POAG primary open angle glaucoma; EBMD epithelial/anterior basement membrane dystrophy; ACIOL anterior chamber intraocular lens; IOL intraocular lens; PCIOL posterior chamber intraocular lens; Phaco/IOL phacoemulsification with  intraocular lens placement; [place] photorefractive keratectomy; LASIK laser assisted in situ keratomileusis; HTN hypertension; DM diabetes mellitus; COPD chronic obstructive pulmonary disease

## 2021-10-10 ENCOUNTER — Other Ambulatory Visit: Payer: Self-pay

## 2021-10-10 ENCOUNTER — Encounter (INDEPENDENT_AMBULATORY_CARE_PROVIDER_SITE_OTHER): Payer: Self-pay | Admitting: Ophthalmology

## 2021-10-10 ENCOUNTER — Ambulatory Visit (INDEPENDENT_AMBULATORY_CARE_PROVIDER_SITE_OTHER): Payer: Medicare HMO | Admitting: Ophthalmology

## 2021-10-10 DIAGNOSIS — H04123 Dry eye syndrome of bilateral lacrimal glands: Secondary | ICD-10-CM

## 2021-10-10 DIAGNOSIS — H3581 Retinal edema: Secondary | ICD-10-CM

## 2021-10-10 DIAGNOSIS — H35033 Hypertensive retinopathy, bilateral: Secondary | ICD-10-CM | POA: Diagnosis not present

## 2021-10-10 DIAGNOSIS — E113313 Type 2 diabetes mellitus with moderate nonproliferative diabetic retinopathy with macular edema, bilateral: Secondary | ICD-10-CM | POA: Diagnosis not present

## 2021-10-10 DIAGNOSIS — I10 Essential (primary) hypertension: Secondary | ICD-10-CM

## 2021-10-10 DIAGNOSIS — H3321 Serous retinal detachment, right eye: Secondary | ICD-10-CM

## 2021-10-10 DIAGNOSIS — H33321 Round hole, right eye: Secondary | ICD-10-CM | POA: Diagnosis not present

## 2021-10-10 DIAGNOSIS — H25813 Combined forms of age-related cataract, bilateral: Secondary | ICD-10-CM

## 2021-10-10 MED ORDER — BEVACIZUMAB CHEMO INJECTION 1.25MG/0.05ML SYRINGE FOR KALEIDOSCOPE
1.2500 mg | INTRAVITREAL | Status: AC | PRN
  Administered 2021-10-10: 1.25 mg via INTRAVITREAL

## 2021-12-24 ENCOUNTER — Other Ambulatory Visit: Payer: Self-pay

## 2021-12-24 ENCOUNTER — Emergency Department (HOSPITAL_COMMUNITY)
Admission: EM | Admit: 2021-12-24 | Discharge: 2021-12-25 | Discharge status: Against Medical Advice | Payer: Medicare HMO | Attending: Emergency Medicine | Admitting: Emergency Medicine

## 2021-12-24 DIAGNOSIS — X58XXXA Exposure to other specified factors, initial encounter: Secondary | ICD-10-CM | POA: Diagnosis not present

## 2021-12-24 DIAGNOSIS — S91001A Unspecified open wound, right ankle, initial encounter: Secondary | ICD-10-CM | POA: Insufficient documentation

## 2021-12-24 DIAGNOSIS — M79605 Pain in left leg: Secondary | ICD-10-CM | POA: Diagnosis not present

## 2021-12-24 DIAGNOSIS — E119 Type 2 diabetes mellitus without complications: Secondary | ICD-10-CM | POA: Insufficient documentation

## 2021-12-24 DIAGNOSIS — Z5321 Procedure and treatment not carried out due to patient leaving prior to being seen by health care provider: Secondary | ICD-10-CM | POA: Diagnosis not present

## 2021-12-24 DIAGNOSIS — M79604 Pain in right leg: Secondary | ICD-10-CM | POA: Insufficient documentation

## 2021-12-24 NOTE — ED Triage Notes (Cosign Needed)
Patient reports increase pain and swelling on BLE x3 days. Pt report unable to tolerate pain tonight even with pain meds. Pt denies N/V/D. Pt denies Fever. Pt a/ox4.

## 2021-12-25 ENCOUNTER — Emergency Department (HOSPITAL_COMMUNITY)
Admission: EM | Admit: 2021-12-25 | Discharge: 2021-12-26 | Discharge status: Against Medical Advice | Payer: Medicare HMO | Source: Home / Self Care

## 2021-12-25 ENCOUNTER — Encounter (HOSPITAL_COMMUNITY): Payer: Self-pay | Admitting: Emergency Medicine

## 2021-12-25 ENCOUNTER — Emergency Department (HOSPITAL_COMMUNITY): Payer: Medicare HMO

## 2021-12-25 ENCOUNTER — Other Ambulatory Visit: Payer: Self-pay

## 2021-12-25 DIAGNOSIS — X58XXXA Exposure to other specified factors, initial encounter: Secondary | ICD-10-CM | POA: Insufficient documentation

## 2021-12-25 DIAGNOSIS — S91001A Unspecified open wound, right ankle, initial encounter: Secondary | ICD-10-CM | POA: Insufficient documentation

## 2021-12-25 DIAGNOSIS — Z5321 Procedure and treatment not carried out due to patient leaving prior to being seen by health care provider: Secondary | ICD-10-CM | POA: Insufficient documentation

## 2021-12-25 DIAGNOSIS — E119 Type 2 diabetes mellitus without complications: Secondary | ICD-10-CM | POA: Insufficient documentation

## 2021-12-25 LAB — CBC WITH DIFFERENTIAL/PLATELET
Abs Immature Granulocytes: 0.03 10*3/uL (ref 0.00–0.07)
Basophils Absolute: 0 10*3/uL (ref 0.0–0.1)
Basophils Relative: 0 %
Eosinophils Absolute: 0.3 10*3/uL (ref 0.0–0.5)
Eosinophils Relative: 5 %
HCT: 37.8 % — ABNORMAL LOW (ref 39.0–52.0)
Hemoglobin: 12.9 g/dL — ABNORMAL LOW (ref 13.0–17.0)
Immature Granulocytes: 1 %
Lymphocytes Relative: 31 %
Lymphs Abs: 1.9 10*3/uL (ref 0.7–4.0)
MCH: 30.4 pg (ref 26.0–34.0)
MCHC: 34.1 g/dL (ref 30.0–36.0)
MCV: 88.9 fL (ref 80.0–100.0)
Monocytes Absolute: 0.6 10*3/uL (ref 0.1–1.0)
Monocytes Relative: 10 %
Neutro Abs: 3.3 10*3/uL (ref 1.7–7.7)
Neutrophils Relative %: 53 %
Platelets: 257 10*3/uL (ref 150–400)
RBC: 4.25 MIL/uL (ref 4.22–5.81)
RDW: 13.8 % (ref 11.5–15.5)
WBC: 6.2 10*3/uL (ref 4.0–10.5)
nRBC: 0 % (ref 0.0–0.2)

## 2021-12-25 LAB — BASIC METABOLIC PANEL
Anion gap: 4 — ABNORMAL LOW (ref 5–15)
Anion gap: 8 (ref 5–15)
BUN: 19 mg/dL (ref 8–23)
BUN: 22 mg/dL (ref 8–23)
CO2: 26 mmol/L (ref 22–32)
CO2: 27 mmol/L (ref 22–32)
Calcium: 8.4 mg/dL — ABNORMAL LOW (ref 8.9–10.3)
Calcium: 9 mg/dL (ref 8.9–10.3)
Chloride: 104 mmol/L (ref 98–111)
Chloride: 108 mmol/L (ref 98–111)
Creatinine, Ser: 1.18 mg/dL (ref 0.61–1.24)
Creatinine, Ser: 1.28 mg/dL — ABNORMAL HIGH (ref 0.61–1.24)
GFR, Estimated: >60 mL/min (ref >60)
GFR, Estimated: >60 mL/min (ref >60)
Glucose, Bld: 135 mg/dL — ABNORMAL HIGH (ref 70–99)
Glucose, Bld: 139 mg/dL — ABNORMAL HIGH (ref 70–99)
Potassium: 3.4 mmol/L — ABNORMAL LOW (ref 3.5–5.1)
Potassium: 3.4 mmol/L — ABNORMAL LOW (ref 3.5–5.1)
Sodium: 138 mmol/L (ref 135–145)
Sodium: 139 mmol/L (ref 135–145)

## 2021-12-25 LAB — CBC
HCT: 35 % — ABNORMAL LOW (ref 39.0–52.0)
Hemoglobin: 11.6 g/dL — ABNORMAL LOW (ref 13.0–17.0)
MCH: 29.7 pg (ref 26.0–34.0)
MCHC: 33.1 g/dL (ref 30.0–36.0)
MCV: 89.5 fL (ref 80.0–100.0)
Platelets: 255 10*3/uL (ref 150–400)
RBC: 3.91 MIL/uL — ABNORMAL LOW (ref 4.22–5.81)
RDW: 13.9 % (ref 11.5–15.5)
WBC: 6.3 10*3/uL (ref 4.0–10.5)
nRBC: 0 % (ref 0.0–0.2)

## 2021-12-25 LAB — LACTIC ACID, PLASMA
Lactic Acid, Venous: 1.2 mmol/L (ref 0.5–1.9)
Lactic Acid, Venous: 1.8 mmol/L (ref 0.5–1.9)

## 2021-12-25 NOTE — ED Triage Notes (Signed)
Patient sent to [facility] for evaluation of right ankle wound that first appeared Thanksgiving of 2021 and has not healed ,history of diabetes. Patient family states went to [facility] yesterday but left without being seen after waiting eight hours. Patient alert, oriented, and in no apparent distress at this time.

## 2021-12-25 NOTE — ED Provider Triage Note (Signed)
Emergency Medicine Provider Triage Evaluation Note  [name] , a 71 y.o. male  was evaluated in triage.  Pt complains of right ankle wound that has been present since November 2021. History of DM. He reported to [facility] ED last night but left prior to being seen. He had an x-ray at that time which was negative for signs of osteomyelitis.   Review of Systems  Positive: wound Negative: fever  Physical Exam  BP (!) 205/83 (BP Location: Left Arm)    Pulse 70    Temp 98.1 F (36.7 C)    Resp 18    SpO2 97%  Gen:   Awake, no distress   Resp:  Normal effort  MSK:   Moves extremities without difficulty  Other:  RLE wound  Medical Decision Making  Medically screening exam initiated at 6:17 PM.  Appropriate orders placed.  [name] was informed that the remainder of the evaluation will be completed by another provider, this initial triage assessment does not replace that evaluation, and the importance of remaining in the ED until their evaluation is complete.  Labs to rule out signs of systemic infection   Mannie Stabile, [place] 12/25/21 1825

## 2021-12-25 NOTE — ED Provider Notes (Signed)
Emergency Medicine Provider Triage Evaluation Note  [name] , a 71 y.o. male  was evaluated in triage.  Pt complains of pain and swelling to bilateral lower extremities x3 days.  Patient's daughter at bedside reports that patient has dementia.  States that he has had increased pain over the last 3 days.  States that swelling has been intermittent over the last year.  She states that patient has wound to right lower extremity that has gradually gotten larger over time.  Denies any recent falls or injuries.  Has been taking his Lasix medication as prescribed.  Review of Systems  Positive: Bilateral lower leg edema, bilateral lower leg pain Negative: Fever, chills, shortness of breath, hemoptysis  Physical Exam  BP (!) 186/85    Pulse 98    Temp 98.3 F (36.8 C) (Oral)    Resp 16    Ht 5\' 7"  (1.702 m)    Wt 95.7 kg    SpO2 98%    BMI 33.05 kg/m  Gen:   Awake, no distress   Resp:  Normal effort  MSK:   Moves extremities without difficulty, edema to bilateral lower extremities.  Patient has erythema and wounds to anterior right lower leg.  +1 DP pulse bilaterally.  Motor and sensation intact to bilateral feet.  Medical Decision Making  Medically screening exam initiated at 12:27 AM.  Appropriate orders placed.  [name] was informed that the remainder of the evaluation will be completed by another provider, this initial triage assessment does not replace that evaluation, and the importance of remaining in the ED until their evaluation is complete.  Will obtain BMP, CBC, lactic acid, and x-ray of right tib-fib due to patient wound and concern for infection.   [name] Fruits, PA-C 12/25/21 0029    02/22/22, MD 12/26/21 [phone]

## 2021-12-26 NOTE — ED Notes (Signed)
Pt said he did not want to wait and would just see his PCP in the AM

## 2021-12-28 ENCOUNTER — Other Ambulatory Visit: Payer: Self-pay | Admitting: Neurology

## 2021-12-31 LAB — CULTURE, BLOOD (ROUTINE X 2)
Culture: NO GROWTH
Special Requests: ADEQUATE

## 2021-12-31 NOTE — Progress Notes (Signed)
[facility] Note  01/02/2022     CHIEF COMPLAINT Patient presents for Retina Follow Up    HISTORY OF PRESENT ILLNESS: [name] is a 71 y.o. male who presents to the clinic today for:   HPI     Retina Follow Up   Patient presents with  Diabetic Retinopathy.  In both eyes.  This started years ago.  Severity is moderate.  Duration of 12 weeks.  Since onset it is stable.  I, the attending physician,  performed the HPI with the patient and updated documentation appropriately.        Comments   71 y/o male pt here for 12 wk f/u for mod NPDR OU.  No change in [place] OU noticed.  Denies pain, FOL, floaters.  No gtts.  BS and A1C unknown.      Last edited by [name], MD on 01/02/2022  8:47 AM.     pt states no change in vision, pt has not gone back to see Dr. Katy Fitch for cataract consult  Referring physician: [name], MD [address] [place],   [address]  HISTORICAL INFORMATION:   Selected notes from the MEDICAL RECORD NUMBER Referred by Dr. Wyatt Portela for concern of HTN Ret   CURRENT MEDICATIONS: Current Outpatient Medications (Ophthalmic Drugs)  Medication Sig   Polyvinyl Alcohol-Povidone (REFRESH OP) Place 1 drop into both eyes in the morning and at bedtime.   No current facility-administered medications for this visit. (Ophthalmic Drugs)   Current Outpatient Medications (Other)  Medication Sig   aspirin 325 MG EC tablet aspirin 325 mg tablet,delayed release   atorvastatin (LIPITOR) 40 MG tablet Take 1 tablet (40 mg total) by mouth daily at 6 PM.   carvedilol (COREG) 6.25 MG tablet Take 6.25 mg by mouth 2 (two) times daily with a meal.   clindamycin (CLEOCIN) 300 MG capsule Take 300 mg by mouth every 6 (six) hours.   donepezil (ARICEPT) 10 MG tablet TAKE 1 TABLET AT BEDTIME   hydrochlorothiazide (HYDRODIURIL) 25 MG tablet Take 25 mg by mouth daily.   losartan (COZAAR) 100 MG tablet Take 100 mg by mouth daily.    losartan (COZAAR) 50 MG tablet    lovastatin (MEVACOR) 10 MG tablet Take 1 tablet by mouth daily.   memantine (NAMENDA) 10 MG tablet TAKE 1 TABLET TWICE DAILY   metFORMIN (GLUCOPHAGE) 500 MG tablet Take by mouth 2 (two) times daily with a meal.   Multiple Vitamins-Minerals (ONE-A-DAY MENS 50+ PO) Take 1 tablet by mouth daily.   mupirocin ointment (BACTROBAN) 2 % SMARTSIG:1 Application Topical 2-3 Times Daily   PFIZER-BIONT COVID-19 VAC-TRIS SUSP injection    PFIZER-BIONTECH COVID-19 VACC 30 MCG/0.3ML injection    Sodium Chloride Flush (NORMAL SALINE FLUSH) 0.9 % SOLN sodium chloride 0.9 % intravenous solution   500 mL by intraven. route.   Sodium Sulfate-Mag Sulfate-KCl (SUTAB) [phone] MG TABS Sutab 1.479-0.188-0.225 gram tablet   spironolactone (ALDACTONE) 25 MG tablet SMARTSIG:0.5 Tablet(s) By Mouth Every Other Day   Current Facility-Administered Medications (Other)  Medication Route   0.9 %  sodium chloride infusion Intravenous   0.9 %  sodium chloride infusion Intravenous   REVIEW OF SYSTEMS: ROS   Positive for: Endocrine, Eyes Negative for: Constitutional, Gastrointestinal, Neurological, Skin, Genitourinary, Musculoskeletal, HENT, Cardiovascular, Respiratory, Psychiatric, Allergic/Imm, Heme/Lymph Last edited by Matthew Folks, COA on 01/02/2022  8:21 AM.     ALLERGIES Allergies  Allergen Reactions   Shellfish Allergy  Anaphylaxis   PAST MEDICAL HISTORY Past Medical History:  Diagnosis Date   Acute cerebrovascular accident (CVA) ([place]) 01/20/2020   Benign essential HTN 01/20/2020   Cataract    Mixed form OU   Diabetic retinopathy ([place])    NPDR OU   Hypertensive retinopathy    OU   Seizures ([place])    per pt- "none since teenage years"   Past Surgical History:  Procedure Laterality Date   LOOP RECORDER INSERTION N/A 01/23/2020   Procedure: LOOP RECORDER INSERTION;  Surgeon: Deboraha Sprang, MD;  Location: [facility];  Service: Cardiovascular;  Laterality:  N/A;   FAMILY HISTORY Family History  Problem Relation Age of Onset   COPD Mother    Diabetes Mother    Dementia Father    Diabetes Father    Cancer Father        prostate and lung   Colon cancer Neg Hx    Esophageal cancer Neg Hx    Stomach cancer Neg Hx    Rectal cancer Neg Hx     SOCIAL HISTORY Social History   Tobacco Use   Smoking status: Never   Smokeless tobacco: Never  Vaping Use   Vaping Use: Never used  Substance Use Topics   Alcohol use: Never   Drug use: Never       OPHTHALMIC EXAM: Base Eye Exam     Visual Acuity (Snellen - Linear)       Right Left   Dist [place] 20/80 - 20/40 -2   Dist ph [place] 20/40 - NI         Tonometry (Tonopen, 8:26 AM)       Right Left   Pressure 19 17         Pupils       Dark Light Shape React APD   Right 3 2 Round Minimal None   Left 3 2 Round Minimal None         Visual Fields (Counting fingers)       Left Right    Full Full         Extraocular Movement       Right Left    Full, Ortho Full, Ortho         Neuro/Psych     Oriented x3: Yes   Mood/Affect: Normal         Dilation     Both eyes: 1.0% Mydriacyl, 2.5% Phenylephrine @ 8:26 AM           Slit Lamp and Fundus Exam     Slit Lamp Exam       Right Left   Lids/Lashes Dermatochalasis - upper lid Dermatochalasis - upper lid   Conjunctiva/Sclera Nasal and temporal Pinguecula, mild Melanosis,  Nasal and temporal Pinguecula, mild Melanosis, Subconjunctival hemorrhage nasally and inferiorly   Cornea Arcus, 1-2+ Punctate epithelial erosions, Debris in tear film Mild Arcus, 2-3+ Punctate epithelial erosions, Debris in tear film   Anterior Chamber Deep and quiet Deep and quiet   Iris Round and dilated, No NVI Round and dilated, No NVI   Lens 2-3+ Nuclear sclerosis, 2-3+ Cortical cataract 2-3+ Nuclear sclerosis, 2-3+ Cortical cataract   Anterior Vitreous Vitreous syneresis Vitreous syneresis, Posterior vitreous detachment          Fundus Exam       Right Left   Disc sharp rim, mild pallor, +cupping Sharp, mild Pallor   C/D Ratio 0.6 0.5   Macula Flat, good foveal reflex, scattered MA/DBH  greatest superior mac -- improved, prominent dot heme along IT arcades - improved, scattered, trace cystic changes (non-central) -- improving, No frank edema Flat, good foveal reflex, scattered Microaneurysms and exudate--improving, trace cystic changes IT macula -- improved   Vessels Vascular attenuation, Tortuous, severe attenuation temporal periphery  Vascular attenuation, Tortuous, severe attenuation temporal perhiphery   Periphery Attached, operculated hole at 1000 with partial pigment and +cuff of SRF--good laser changes surrounding, scattered MA, White without pressure temporal periphery Attached, scattered IRH and CWS, White without pressure temporally, focal pigmented CR scar at 0130 equator, good peripheral 360 PRP           IMAGING AND PROCEDURES  Imaging and Procedures for _0 @  OCT, Retina - OU - Both Eyes       Right Eye Quality was good. Central Foveal Thickness: 232. Progression has been stable. Findings include normal foveal contour, no SRF, no IRF (mild diffuse retinal thinning -- stable).   Left Eye Quality was good. Central Foveal Thickness: 242. Progression has improved. Findings include normal foveal contour, intraretinal hyper-reflective material, no SRF, vitreomacular adhesion , no IRF (Trace persistent cystic changes IT mac -- slightly improved).   Notes *Images captured and stored on drive  Diagnosis / Impression:  OD: NFP, no IRF/SRF -- Mild diffuse retinal thinning -- stable OS: NFP, no SRF, Trace persistent cystic changes IT mac -- slightly improved   Clinical management:  See below  Abbreviations: NFP - Normal foveal profile. CME - cystoid macular edema. PED - pigment epithelial detachment. IRF - intraretinal fluid. SRF - subretinal fluid. EZ - ellipsoid zone. ERM - epiretinal membrane. ORA  - outer retinal atrophy. ORT - outer retinal tubulation. SRHM - subretinal hyper-reflective material      Intravitreal Injection, Pharmacologic Agent - OS - Left Eye       Time Out 01/02/2022. 9:09 AM. Confirmed correct patient, procedure, site, and patient consented.   Anesthesia Topical anesthesia was used. Anesthetic medications included Lidocaine 2%, Proparacaine 0.5%.   Procedure Preparation included 5% betadine to ocular surface, eyelid speculum. A (32g) needle was used.   Injection: 1.25 mg Bevacizumab 1.39m/0.05ml   Route: Intravitreal, Site: Left Eye   NDC:: 62952-841-32 Lot:: [id number] Expiration date: 01/30/2022, Waste: 0.05 mL   Post-op Post injection exam found visual acuity of at least counting fingers. The patient tolerated the procedure well. There were no complications. The patient received written and verbal post procedure care education.            ASSESSMENT/PLAN:    ICD-10-CM   1. Moderate nonproliferative diabetic retinopathy of both eyes with macular edema associated with type 2 diabetes mellitus (HCC)  E11.3313 OCT, Retina - OU - Both Eyes    Intravitreal Injection, Pharmacologic Agent - OS - Left Eye    Bevacizumab (AVASTIN) SOLN 1.25 mg    2. Retinal hole of right eye  H33.321     3. Essential hypertension  I10     4. Hypertensive retinopathy of both eyes  H35.033     5. Combined forms of age-related cataract of both eyes  H25.813     6. Dry eyes  H04.123      1. Moderate non-proliferative diabetic retinopathy, OU  - s/p IVA OS #1 (05.07.21), #2 (06.04.21), #3 (07.06.21), #4 (08.03.21), #5 (08.31.21), #6 (9.28.21), #7 (11.2.21), #8 (12.07.21), #9 (02.02.22), #10 (03.30.22), #11 (05.25.22), #12 (08.03.22), #13 (10.20.22)  - s/p PRP OS (04.09.21) -- good laser changes  - repeat FA (09.28.21) shows late leaking MA  OU, significant capillary drop-out   - exam shows scattered MA/IRH/CWS OU -- improving  - BCVA OD stable at 20/40; OS stable at  20/25  - OCT shows OD: mild diffuse retinal thinning -- stable; OS: Trace persistent cystic changes IT mac at 12 weeks  - recommend IVA OS #14 today, 01.12.23 -- w/ f/u ext to 14 wks  - pt wishes to proceed with IVA OS  - RBA of procedure discussed, questions answered  - informed consent obtained, signed and scanned, 05.07.21  - see procedure note  - f/u 14 weeks -- DFE/OCT  2. Operculated retinal hole w/ cuff of SRF / focal RD, right eye  - operculated hole located at 1000 with partial pigment and +cuff of SRF / focal RD  - s/p retinopexy OD (03.17.21) -- good laser changes surrounding  - monitor  3,4. Hypertensive retinopathy OU  - discussed importance of tight BP control  - monitor  5. Mixed form age related cataract OU  - The symptoms of cataract, surgical options, and treatments and risks were discussed with patient.  - discussed diagnosis and progression  - under the expert management of Dr. Zenia Resides  - now that macular edema is improved and relatively stable, pt is clear from a retina standpoint to proceed with cataract surgery when pt and surgeon are ready  - will send back to Dr. Zenia Resides for cataract evaluation  6. Dry eyes OU  - recommend artificial tears and lubricating ointment as needed  Ophthalmic Meds Ordered this visit:  Meds ordered this encounter  Medications   Bevacizumab (AVASTIN) SOLN 1.25 mg     Return in about 14 weeks (around 04/10/2022) for f/u NPDR OU, DFE, OCT.  There are no Patient Instructions on file for this visit.  Explained the diagnoses, plan, and follow up with the patient and they expressed understanding.  Patient expressed understanding of the importance of proper follow up care.   This document serves as a record of services personally performed by Gardiner Sleeper, MD, PhD. It was created on their behalf by San Jetty. Owens Shark, OA an ophthalmic technician. The creation of this record is the provider's dictation and/or activities during the  visit.    Electronically signed by: San Jetty. Owens Shark, [place] 01.10.2023 9:24 AM   Gardiner Sleeper, M.D., Ph.D. Diseases & Surgery of the Retina and Vitreous [facility]  I have reviewed the above documentation for accuracy and completeness, and I agree with the above. Gardiner Sleeper, M.D., Ph.D. 01/02/22 9:24 AM   Abbreviations: M myopia (nearsighted); A astigmatism; H hyperopia (farsighted); P presbyopia; Mrx spectacle prescription;  CTL contact lenses; OD right eye; OS left eye; OU both eyes  XT exotropia; ET esotropia; PEK punctate epithelial keratitis; PEE punctate epithelial erosions; DES dry eye syndrome; MGD meibomian gland dysfunction; ATs artificial tears; PFAT's preservative free artificial tears; [place] nuclear sclerotic cataract; PSC posterior subcapsular cataract; ERM epi-retinal membrane; PVD posterior vitreous detachment; RD retinal detachment; DM diabetes mellitus; DR diabetic retinopathy; NPDR non-proliferative diabetic retinopathy; PDR proliferative diabetic retinopathy; CSME clinically significant macular edema; DME diabetic macular edema; dbh dot blot hemorrhages; CWS cotton wool spot; POAG primary open angle glaucoma; C/D cup-to-disc ratio; HVF humphrey visual field; GVF goldmann visual field; OCT optical coherence tomography; IOP intraocular pressure; BRVO Branch retinal vein occlusion; CRVO central retinal vein occlusion; CRAO central retinal artery occlusion; BRAO branch retinal artery occlusion; RT retinal tear; SB scleral buckle; PPV pars plana vitrectomy; VH Vitreous  hemorrhage; PRP panretinal laser photocoagulation; IVK intravitreal kenalog; VMT vitreomacular traction; MH Macular hole;  NVD neovascularization of the disc; NVE neovascularization elsewhere; AREDS age related eye disease study; ARMD age related macular degeneration; POAG primary open angle glaucoma; EBMD epithelial/anterior basement membrane dystrophy; ACIOL anterior chamber intraocular lens; IOL  intraocular lens; PCIOL posterior chamber intraocular lens; Phaco/IOL phacoemulsification with intraocular lens placement; Lambert photorefractive keratectomy; LASIK laser assisted in situ keratomileusis; HTN hypertension; DM diabetes mellitus; COPD chronic obstructive pulmonary disease

## 2022-01-02 ENCOUNTER — Encounter (INDEPENDENT_AMBULATORY_CARE_PROVIDER_SITE_OTHER): Payer: Self-pay | Admitting: Ophthalmology

## 2022-01-02 ENCOUNTER — Ambulatory Visit (INDEPENDENT_AMBULATORY_CARE_PROVIDER_SITE_OTHER): Payer: Medicare HMO | Admitting: Ophthalmology

## 2022-01-02 ENCOUNTER — Other Ambulatory Visit: Payer: Self-pay

## 2022-01-02 DIAGNOSIS — E113313 Type 2 diabetes mellitus with moderate nonproliferative diabetic retinopathy with macular edema, bilateral: Secondary | ICD-10-CM

## 2022-01-02 DIAGNOSIS — I10 Essential (primary) hypertension: Secondary | ICD-10-CM

## 2022-01-02 DIAGNOSIS — H35033 Hypertensive retinopathy, bilateral: Secondary | ICD-10-CM

## 2022-01-02 DIAGNOSIS — H33321 Round hole, right eye: Secondary | ICD-10-CM | POA: Diagnosis not present

## 2022-01-02 DIAGNOSIS — H3321 Serous retinal detachment, right eye: Secondary | ICD-10-CM

## 2022-01-02 DIAGNOSIS — H04123 Dry eye syndrome of bilateral lacrimal glands: Secondary | ICD-10-CM

## 2022-01-02 DIAGNOSIS — H25813 Combined forms of age-related cataract, bilateral: Secondary | ICD-10-CM

## 2022-01-02 MED ORDER — BEVACIZUMAB CHEMO INJECTION 1.25MG/0.05ML SYRINGE FOR KALEIDOSCOPE
1.2500 mg | INTRAVITREAL | Status: AC | PRN
  Administered 2022-01-02: 1.25 mg via INTRAVITREAL

## 2022-02-06 IMAGING — CR DG KNEE COMPLETE 4+V*L*
4 series · 4 of 4 positions shown · non-contrast
Comparison: None.

CLINICAL DATA: Fall with knee pain

EXAM:
LEFT KNEE - COMPLETE 4+ VIEW

[knee ap]
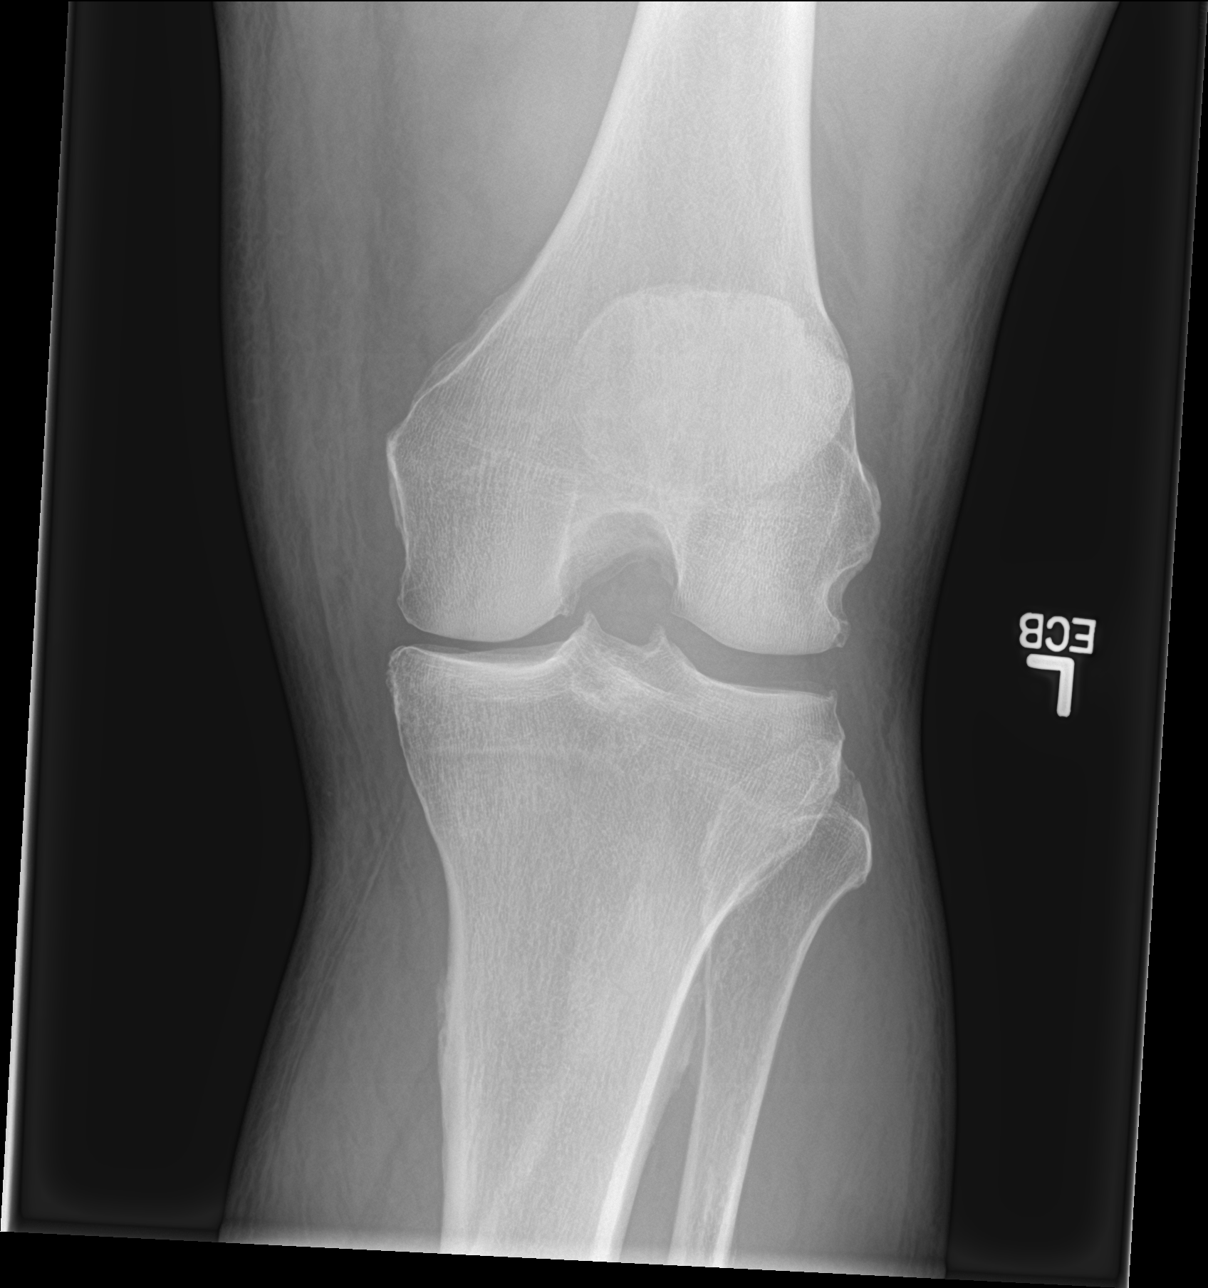

[knee lat]
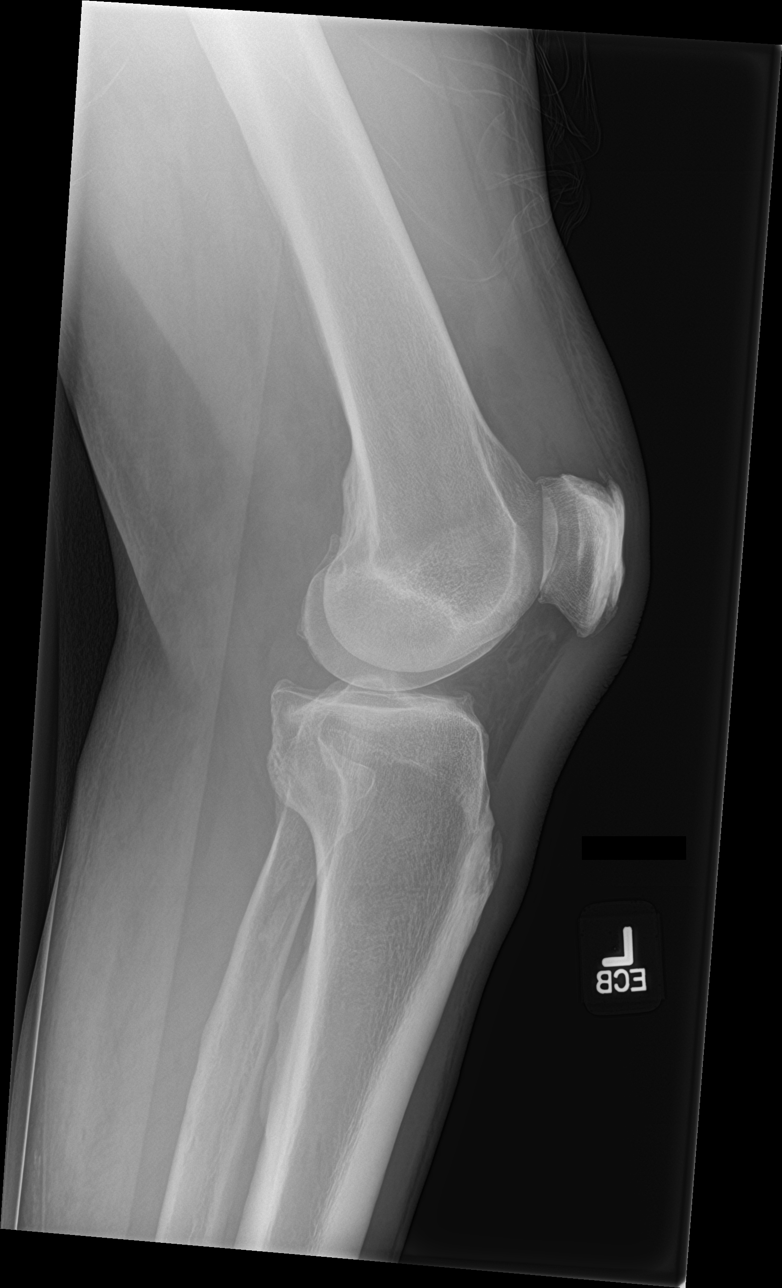

[knee obl (1 of 2)]
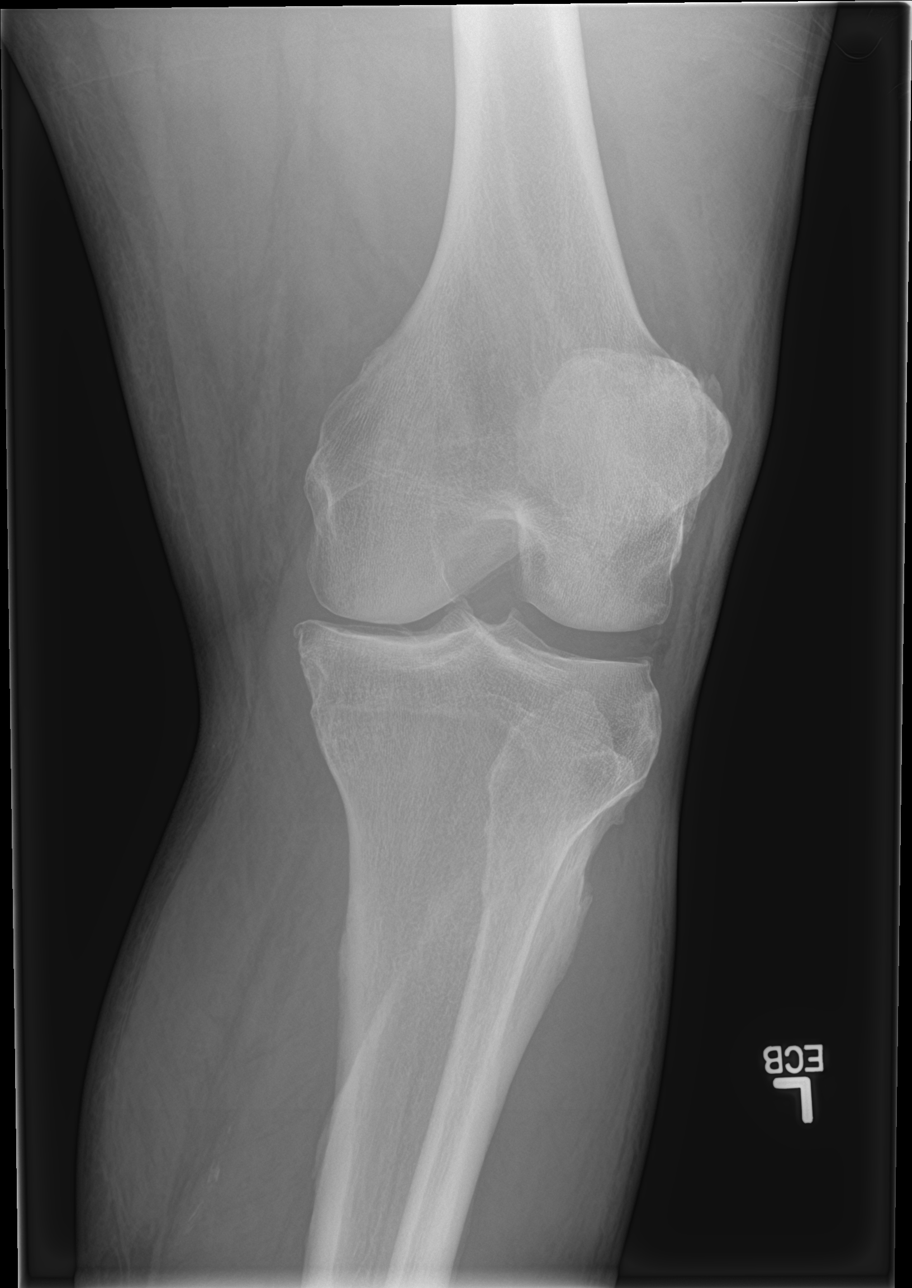

[knee obl (2 of 2)]
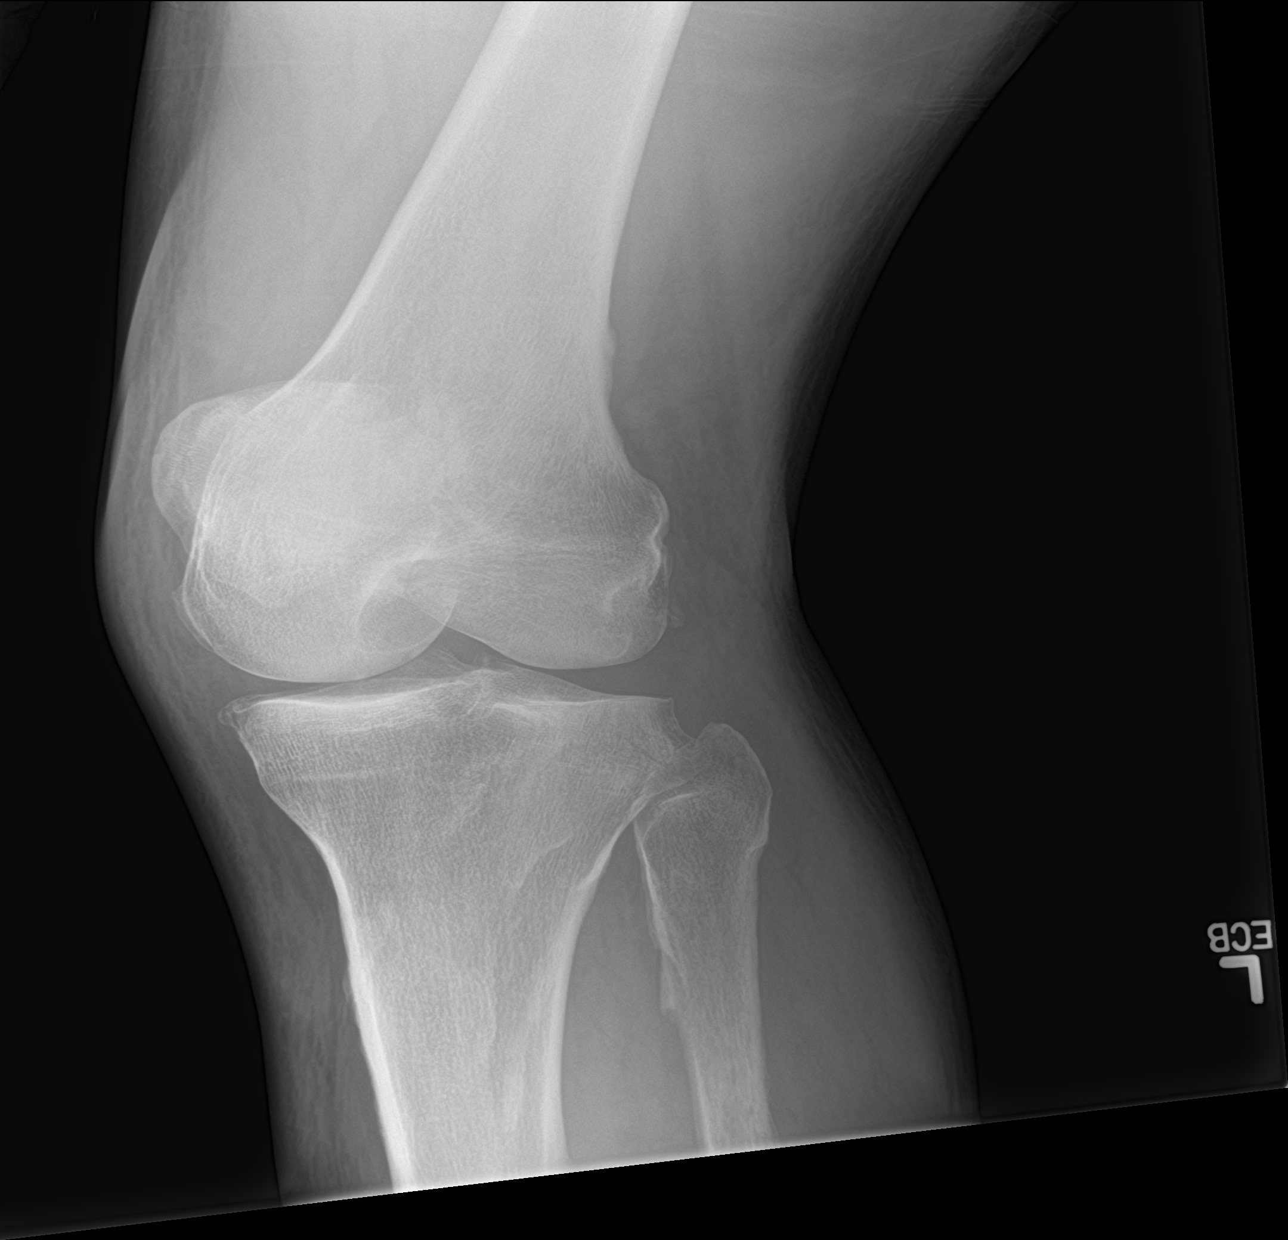

[4 of 4 positions shown; findings below may reference images not displayed]

FINDINGS: Small knee effusion. No definitive fracture or malalignment. Mild
tricompartment arthritis.
IMPRESSION: Small knee effusion. No definite acute osseous abnormality.

## 2022-02-06 IMAGING — CR DG KNEE COMPLETE 4+V*R*
4 series · 4 of 4 positions shown · non-contrast
Comparison: None.

CLINICAL DATA: Fall, abrasion

EXAM:
RIGHT KNEE - COMPLETE 4+ VIEW

[knee ap]
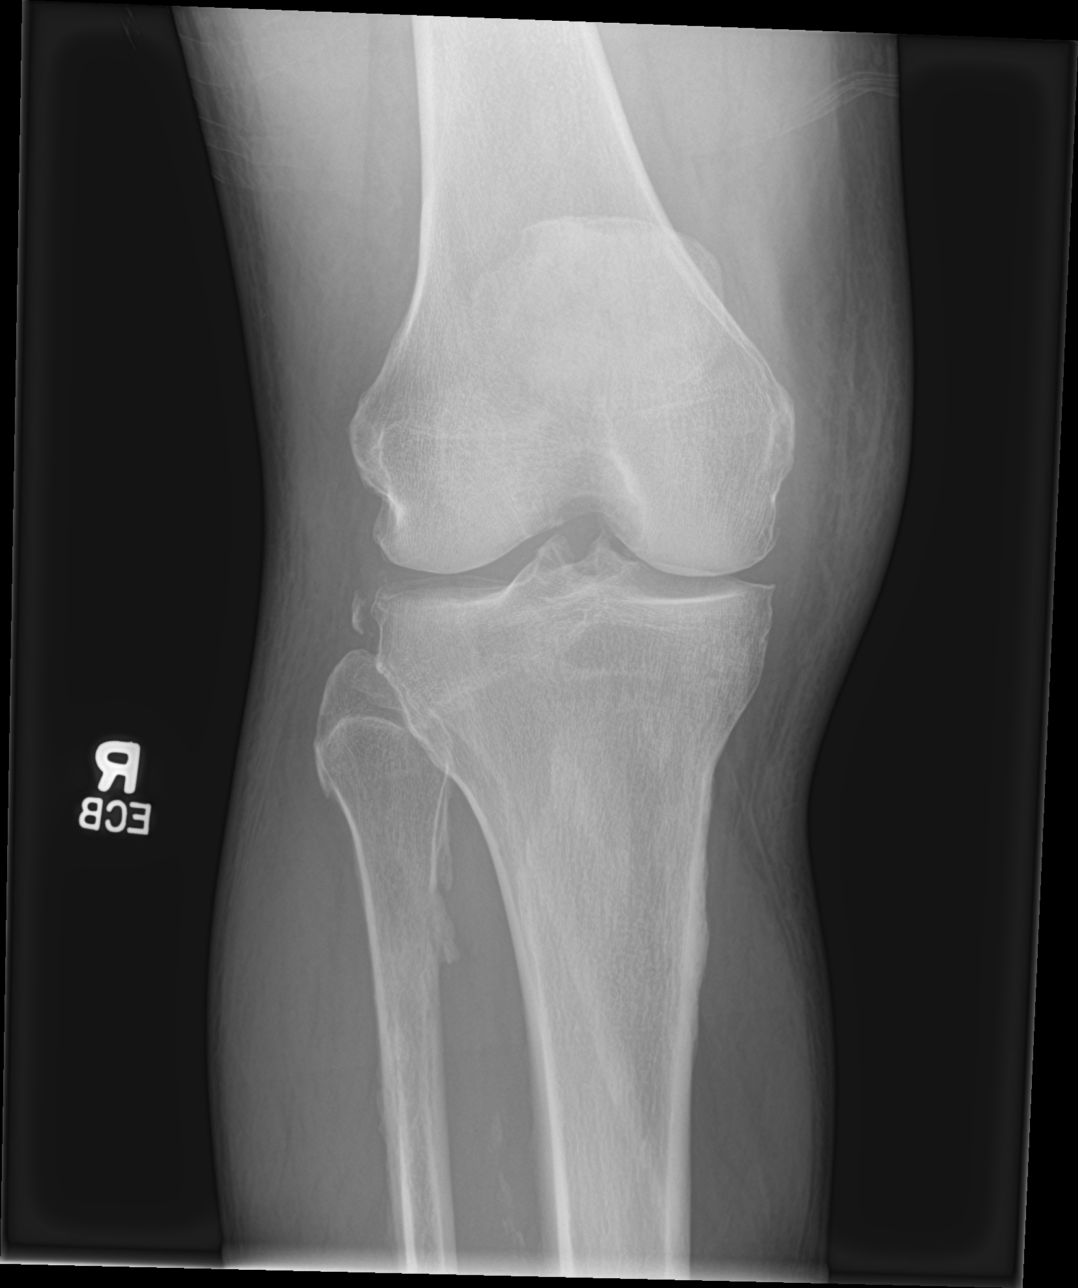

[knee lat]
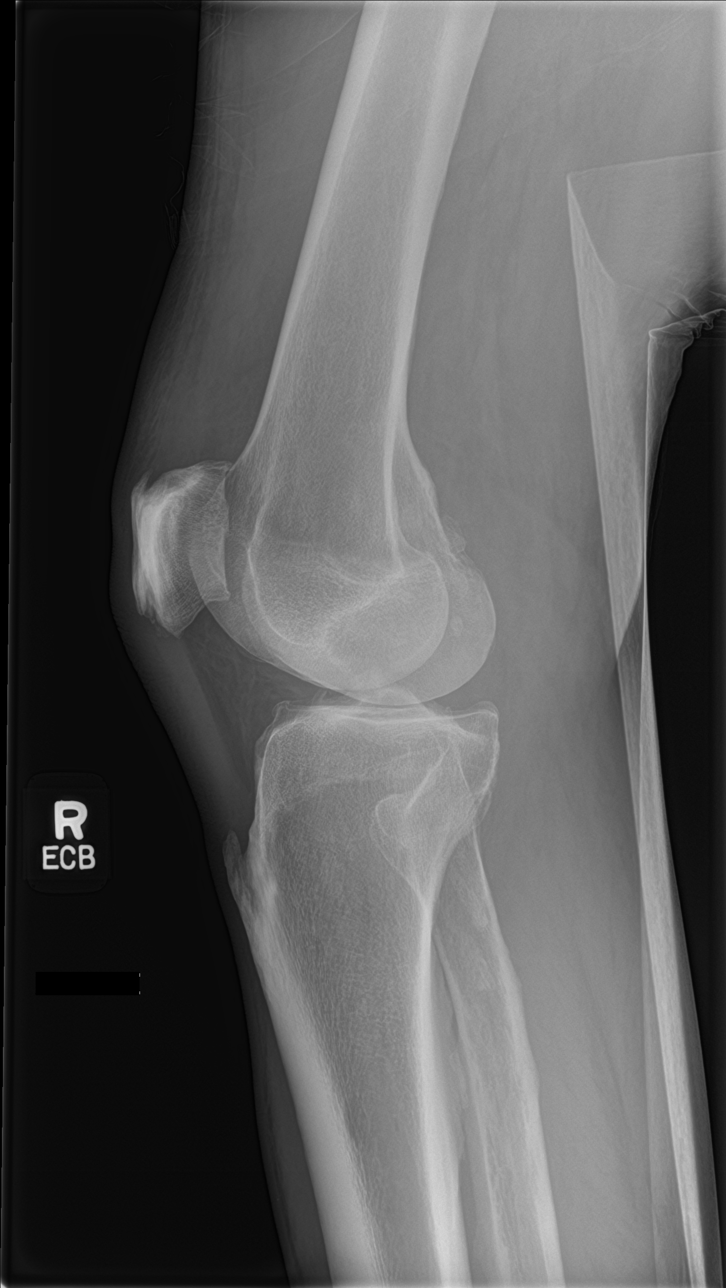

[knee obl (1 of 2)]
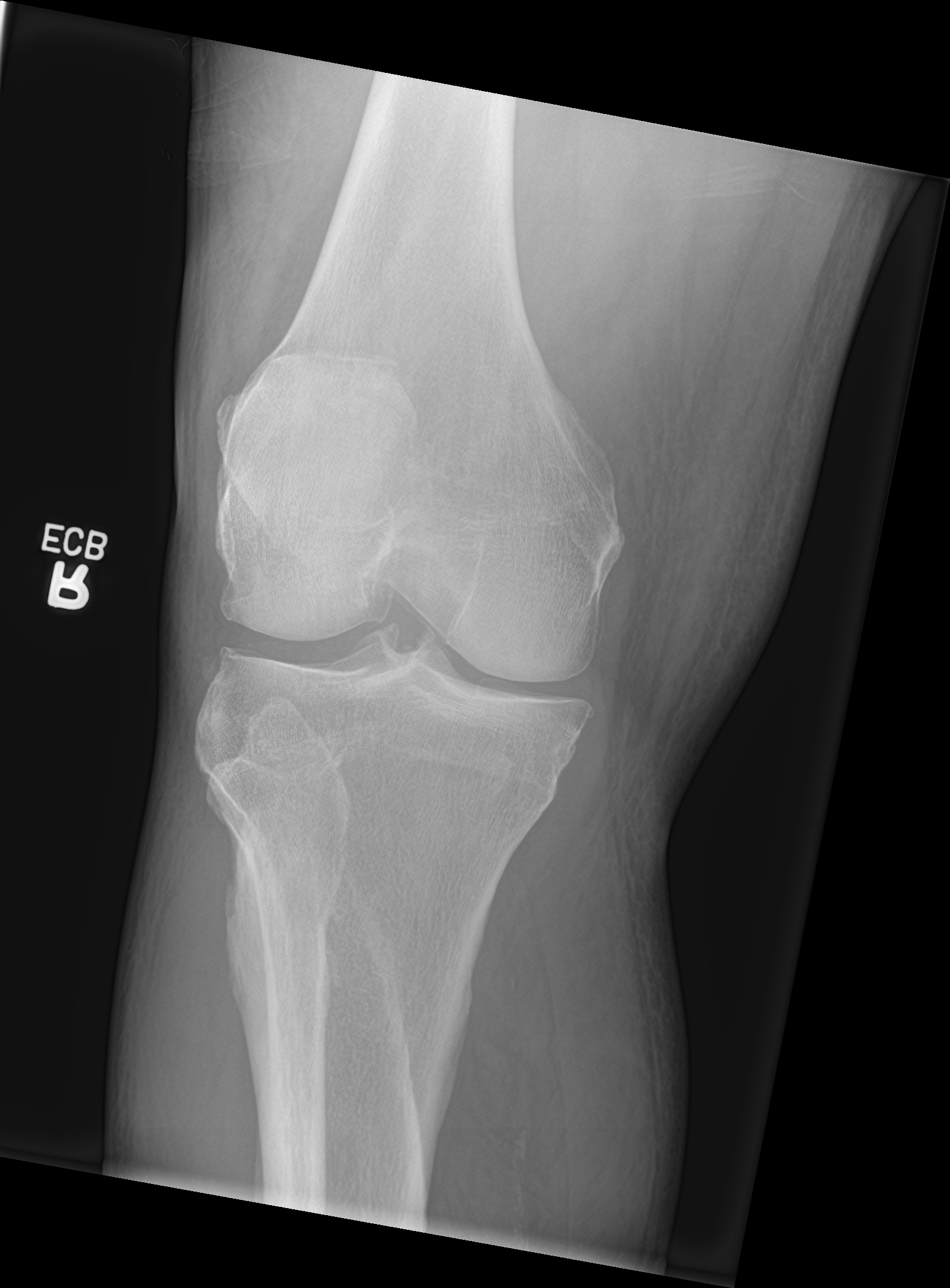

[knee obl (2 of 2)]
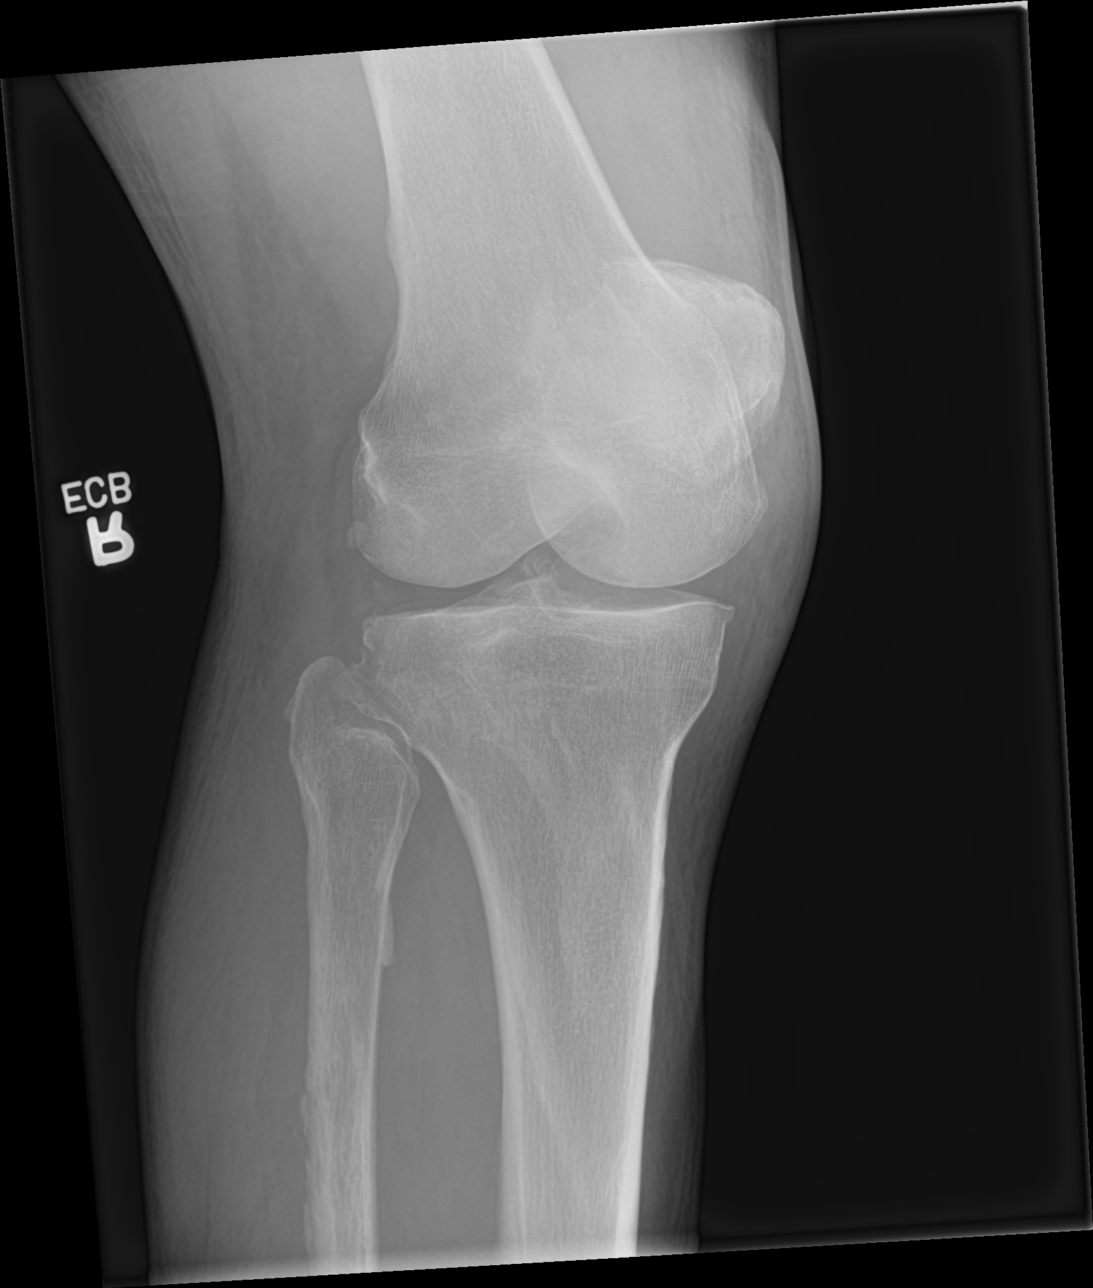

[4 of 4 positions shown; findings below may reference images not displayed]

FINDINGS: No dislocation is evident. Small knee effusion. Mild tricompartment
arthritis. Joint space calcification. Curvilinear calcification
adjacent to the lateral tibial plateau indeterminate for avulsion
fraction versus ligamentous calcification. Questionable subtle
fracture deformity at the fibular head and proximal shaft.
Generalized soft tissue swelling
IMPRESSION: 1. Curvilinear calcification adjacent to the lateral tibial plateau
indeterminate for avulsion fracture versus ligamentous
calcification.
2. Questionable subtle fracture deformity at the fibular head and
proximal shaft.
3. Small knee effusion.

## 2022-02-06 IMAGING — CT CT HEAD W/O CM
4 series · 16 of 47 positions shown, 18 images · non-contrast
Comparison: MRI 01/21/2020, CT brain 01/20/2020

CLINICAL DATA: Head trauma

EXAM:
CT HEAD WITHOUT CONTRAST
TECHNIQUE: Contiguous axial images were obtained from the base of the skull
through the vertex without intravenous contrast.

[Series 3: head wo · axial · 0.46mm/px · z∈[+1098,+1223]mm · 7 of 35 slices shown, 9 images]
[im 5/35  brain]
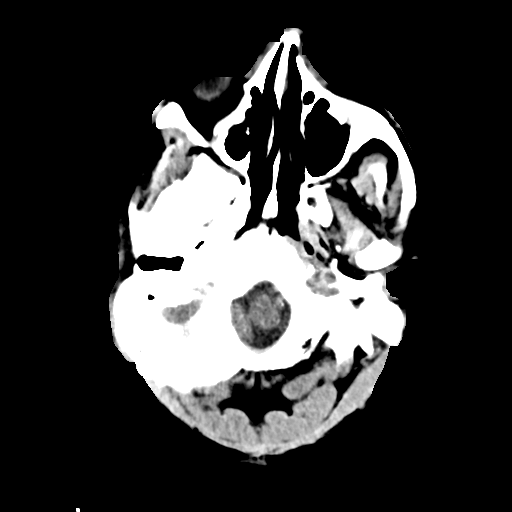
[im 5/35  bone]
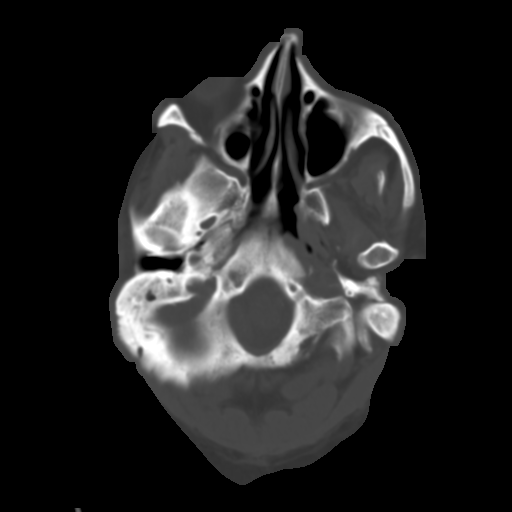
[im 9/35  brain]
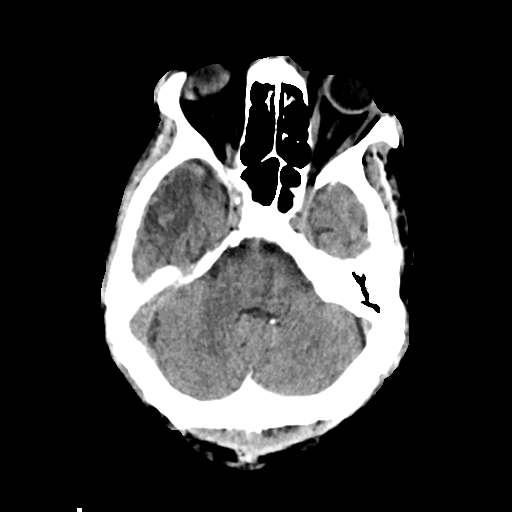
[im 13/35  brain]
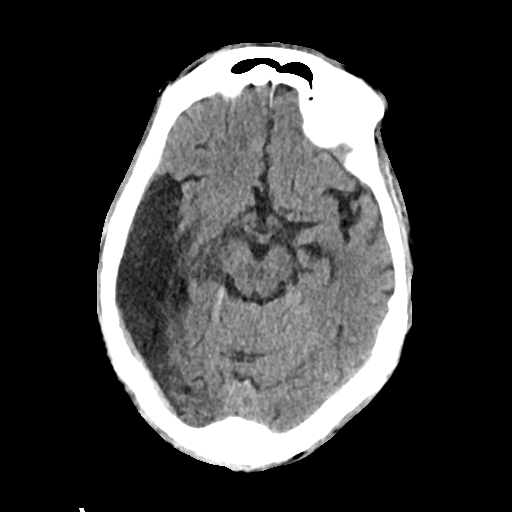
[im 18/35  brain]
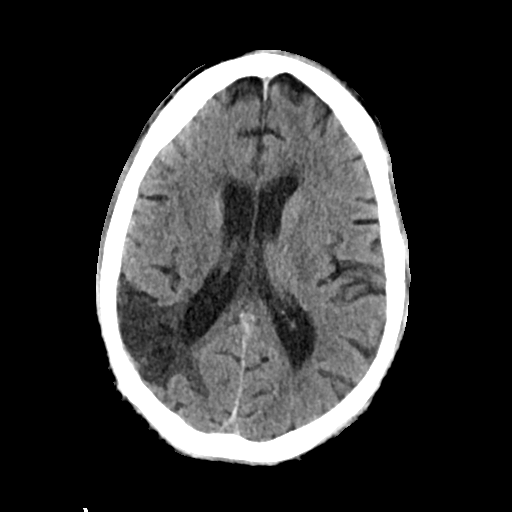
[im 22/35  brain]
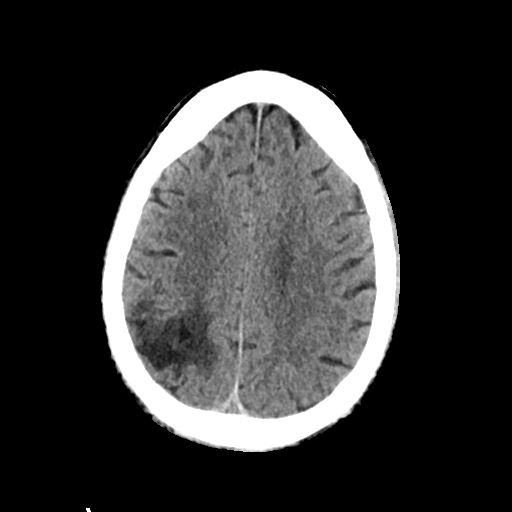
[im 22/35  bone]
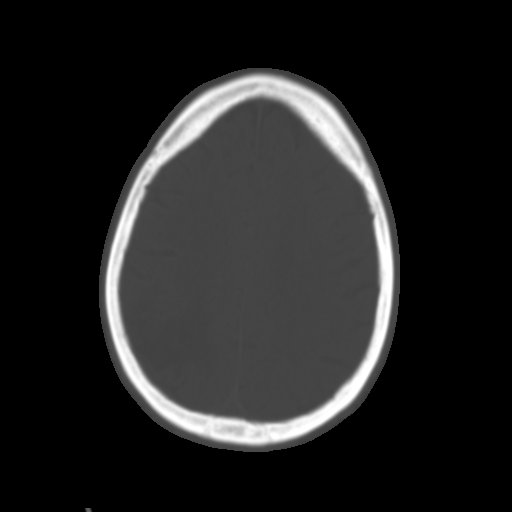
[im 26/35  brain]
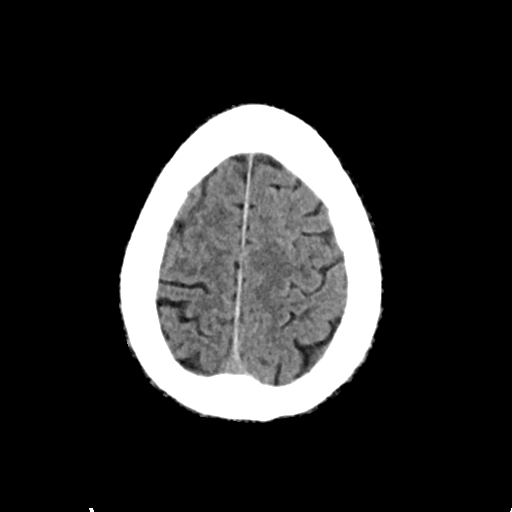
[im 30/35  brain]
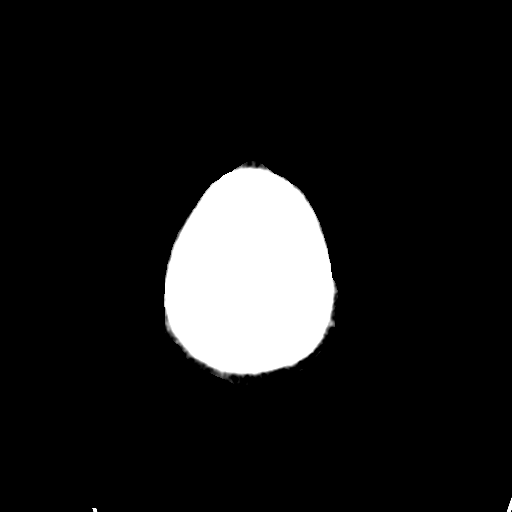

[Series 4: head bone · axial · 0.46mm/px · z∈[+1094,+1128]mm · 3 of 87 slices shown]
[im 9/87  bone]
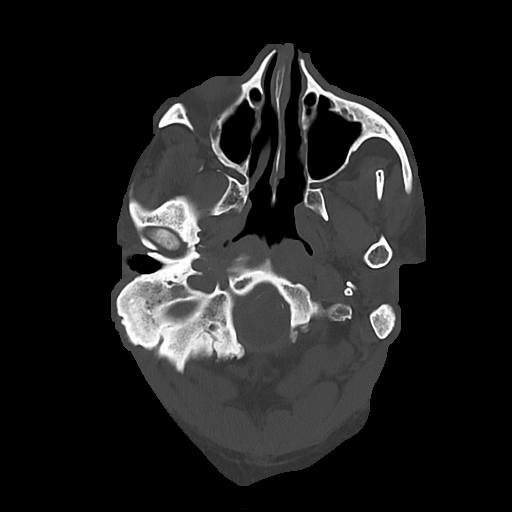
[im 18/87  bone]
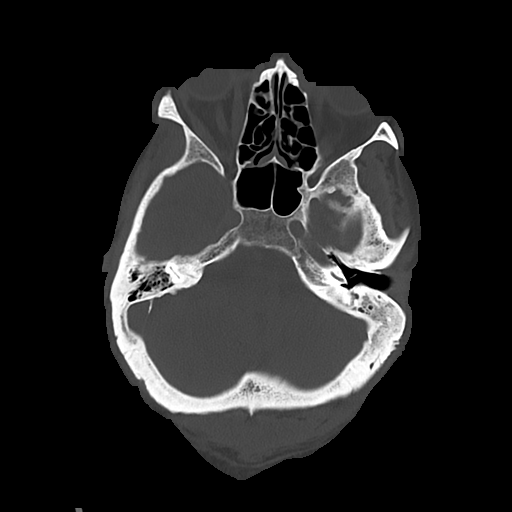
[im 26/87  bone]
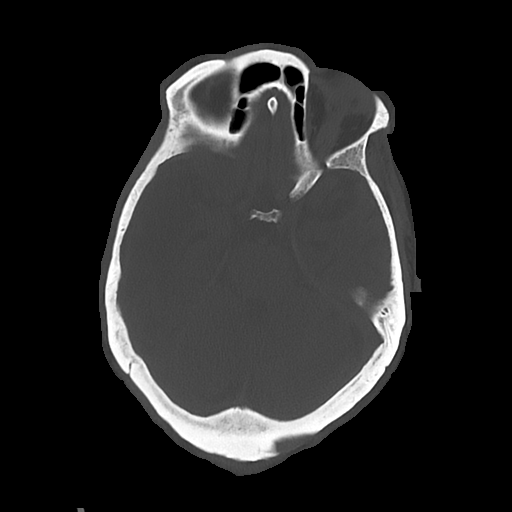

[Series 5: cor soft · coronal · 0.33mm/px · 3 of 77 slices shown]
[im 26/77  brain]
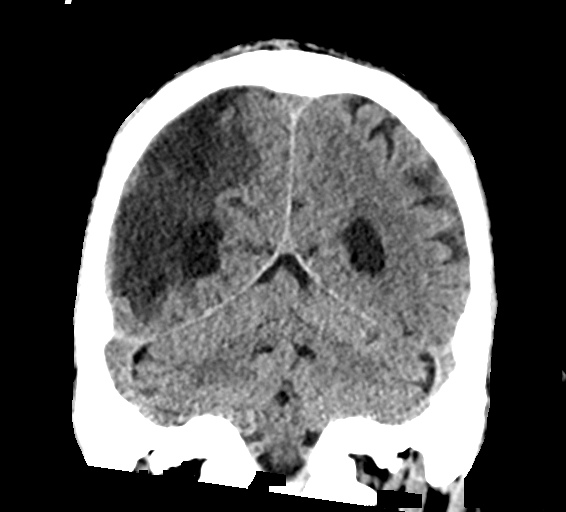
[im 34/77  brain]
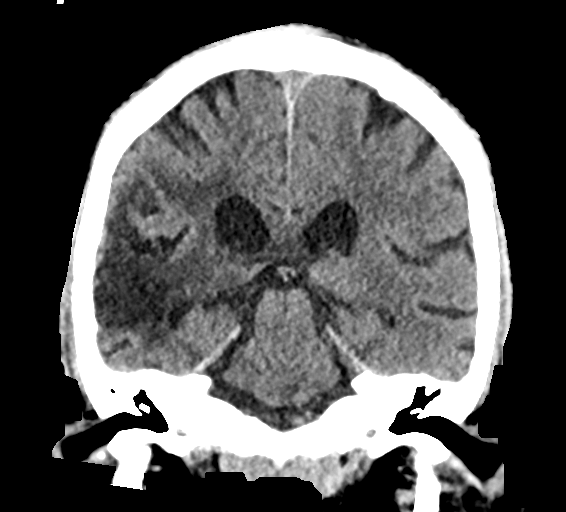
[im 43/77  brain]
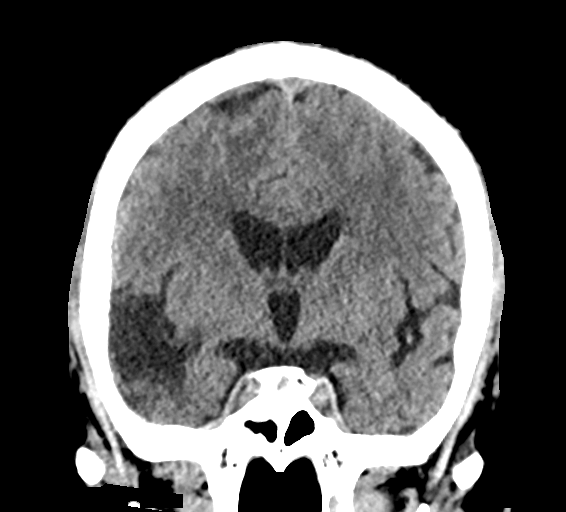

[Series 6: sag soft · sagittal · 0.33mm/px · 3 of 61 slices shown]
[im 24/61  brain]
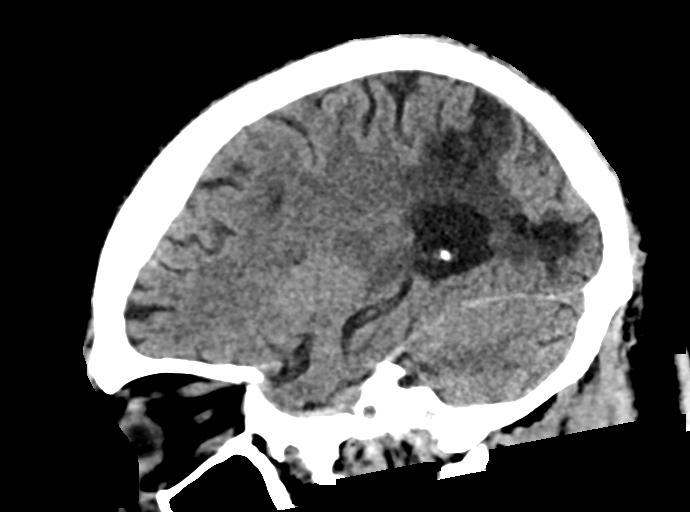
[im 32/61  brain]
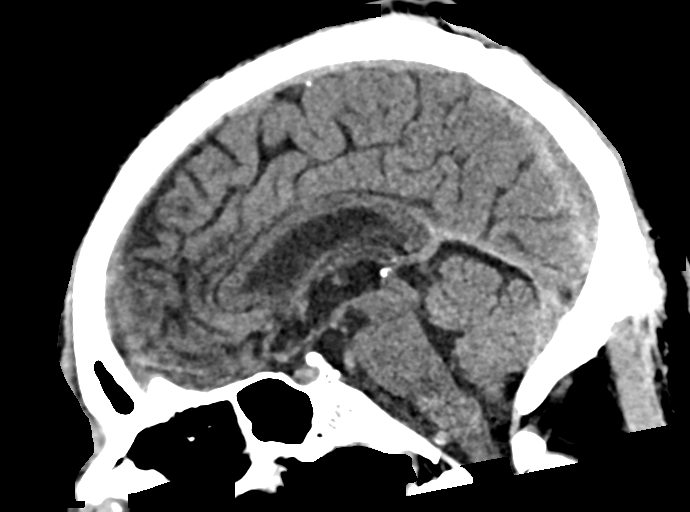
[im 39/61  brain]
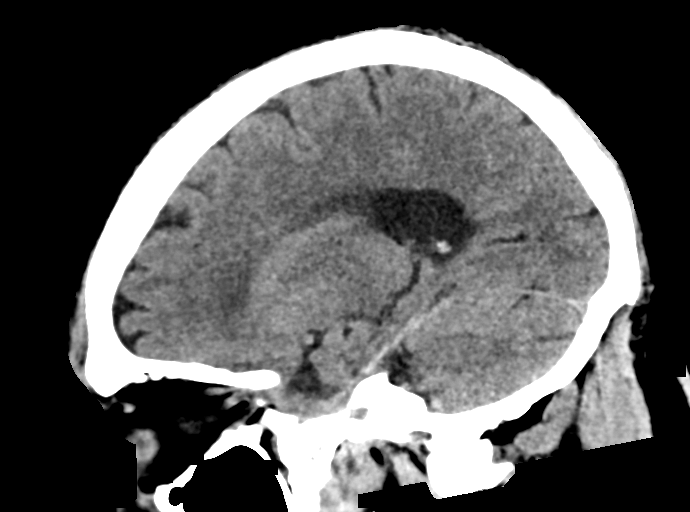

[16 of 47 positions shown; findings below may reference images not displayed]

FINDINGS: Brain: Large chronic right MCA infarct with ex vacuo dilatation of
right lateral ventricle. No acute territorial infarction, hemorrhage
or intracranial mass. Mild chronic small vessel ischemic change of
the white matter. Stable ventricle size.

Vascular: Hyperdense vessels. Vertebral and carotid vascular
calcification

Skull: Normal. Negative for fracture or focal lesion.

Sinuses/Orbits: No acute finding.

Other: None
IMPRESSION: 1. No CT evidence for acute intracranial abnormality.
2. Large chronic right MCA infarct.
3. Mild chronic small vessel ischemic change of the white matter.

## 2022-03-13 ENCOUNTER — Ambulatory Visit: Payer: Medicare HMO | Admitting: Adult Health

## 2022-03-13 ENCOUNTER — Encounter: Payer: Self-pay | Admitting: Adult Health

## 2022-03-13 VITALS — BP 193/82 | HR 81 | Ht 67.0 in | Wt 225.0 lb

## 2022-03-13 DIAGNOSIS — I639 Cerebral infarction, unspecified: Secondary | ICD-10-CM | POA: Diagnosis not present

## 2022-03-13 DIAGNOSIS — F01A0 Vascular dementia, mild, without behavioral disturbance, psychotic disturbance, mood disturbance, and anxiety: Secondary | ICD-10-CM | POA: Diagnosis not present

## 2022-03-13 MED ORDER — DONEPEZIL HCL 10 MG PO TABS: 10.0000 mg | ORAL_TABLET | Freq: Every day | ORAL | 3 refills | Status: DC

## 2022-03-13 MED ORDER — MEMANTINE HCL 10 MG PO TABS: 10.0000 mg | ORAL_TABLET | Freq: Two times a day (BID) | ORAL | 3 refills | Status: DC

## 2022-03-13 NOTE — Patient Instructions (Signed)
Your Plan: ? ?Continue current treatment regimen ? ? ? ?Follow-up in 1 year or call earlier if needed ? ? ? ? ?Thank you for coming to see [place] at [facility]. I hope we have been able to provide you high quality care today. ? ?You may receive a patient satisfaction survey over the next few weeks. We would appreciate your feedback and comments so that we may continue to improve ourselves and the health of our patients. ? ?
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
## 2022-04-07 NOTE — Progress Notes (Signed)
?[facility] Note ? ?04/10/2022 ? ?  ? ?CHIEF COMPLAINT ?Patient presents for Retina Follow Up and Dry Eye ? ? ? ?HISTORY OF PRESENT ILLNESS: ?[name] is a 71 y.o. male who presents to the clinic today for:  ? ?HPI   ? ? Retina Follow Up   ?Patient presents with  Diabetic Retinopathy (IVA 01/02/22).  In both eyes.  This started years ago.  Severity is moderate.  Duration of 14 weeks.  Since onset it is stable.  I, the attending physician,  performed the HPI with the patient and updated documentation appropriately. ? ?  ?  ? ? Dry Eye   ?In both eyes.  Associated signs and symptoms include tearing and itching.  Occurring intermittently.  It is worse throughout the day and at random times.  Duration of days.  I, the attending physician,  performed the HPI with the patient and updated documentation appropriately. ? ?  ?  ? ? Comments   ?Patient feels that there has been no changes in his vision. He has not checked his blood sugar in a while. He states that he has been seeing floaters. ? ?  ?  ?Last edited by [name], MD on 04/10/2022  2:06 PM.  ?  ?Pts A1c was 6.0 in February, he states his BP and blood sugar are controlled, his vision is "okay" ? ?Referring physician: ?Sonia Side., FNP ?[address] ?[place],  [place] [address] ? ?HISTORICAL INFORMATION:  ? ?Selected notes from the [facility] ?Referred by Dr. Wyatt Portela for concern of HTN Ret  ? ?CURRENT MEDICATIONS: ?Current Outpatient Medications (Ophthalmic Drugs)  ?Medication Sig  ? Polyvinyl Alcohol-Povidone (REFRESH OP) Place 1 drop into both eyes in the morning and at bedtime.  ? ?No current facility-administered medications for this visit. (Ophthalmic Drugs)  ? ?Current Outpatient Medications (Other)  ?Medication Sig  ? aspirin 325 MG EC tablet aspirin 325 mg tablet,delayed release  ? atorvastatin (LIPITOR) 40 MG tablet Take 1 tablet (40 mg total) by mouth daily at 6 PM.  ? carvedilol (COREG) 6.25 MG  tablet Take 6.25 mg by mouth 2 (two) times daily with a meal.  ? clindamycin (CLEOCIN) 300 MG capsule Take 300 mg by mouth every 6 (six) hours.  ? donepezil (ARICEPT) 10 MG tablet Take 1 tablet (10 mg total) by mouth at bedtime.  ? hydrochlorothiazide (HYDRODIURIL) 25 MG tablet Take 25 mg by mouth daily.  ? losartan (COZAAR) 100 MG tablet Take 100 mg by mouth daily.  ? losartan (COZAAR) 50 MG tablet   ? lovastatin (MEVACOR) 10 MG tablet Take 1 tablet by mouth daily.  ? memantine (NAMENDA) 10 MG tablet Take 1 tablet (10 mg total) by mouth 2 (two) times daily.  ? metFORMIN (GLUCOPHAGE) 500 MG tablet Take by mouth 2 (two) times daily with a meal.  ? Multiple Vitamins-Minerals (ONE-A-DAY MENS 50+ PO) Take 1 tablet by mouth daily.  ? mupirocin ointment (BACTROBAN) 2 % SMARTSIG:1 Application Topical 2-3 Times Daily  ? PFIZER-BIONT COVID-19 VAC-TRIS SUSP injection   ? PFIZER-BIONTECH COVID-19 VACC 30 MCG/0.3ML injection   ? Sodium Chloride Flush (NORMAL SALINE FLUSH) 0.9 % SOLN sodium chloride 0.9 % intravenous solution ?  500 mL by intraven. route.  ? Sodium Sulfate-Mag Sulfate-KCl (SUTAB) [phone] MG TABS Sutab 1.479-0.188-0.225 gram tablet  ? spironolactone (ALDACTONE) 25 MG tablet SMARTSIG:0.5 Tablet(s) By Mouth Every Other Day  ? ?Current Facility-Administered Medications (Other)  ?Medication Route  ?  0.9 %  sodium chloride infusion Intravenous  ? 0.9 %  sodium chloride infusion Intravenous  ? ?REVIEW OF SYSTEMS: ?ROS   ?Positive for: Endocrine, Eyes ?Negative for: Constitutional, Gastrointestinal, Neurological, Skin, Genitourinary, Musculoskeletal, HENT, Cardiovascular, Respiratory, Psychiatric, Allergic/Imm, Heme/Lymph ?Last edited by Annie Paras, COT on 04/10/2022  8:20 AM.  ?  ? ? ?ALLERGIES ?Allergies  ?Allergen Reactions  ? Shellfish Allergy Anaphylaxis  ? ?PAST MEDICAL HISTORY ?Past Medical History:  ?Diagnosis Date  ? Acute cerebrovascular accident (CVA) ([facility]) 01/20/2020  ? Benign essential HTN  01/20/2020  ? Cataract   ? Mixed form OU  ? Diabetic retinopathy ([facility])   ? NPDR OU  ? Hypertensive retinopathy   ? OU  ? Seizures ([facility])   ? per pt- "none since teenage years"  ? ?Past Surgical History:  ?Procedure Laterality Date  ? LOOP RECORDER INSERTION N/A 01/23/2020  ? Procedure: LOOP RECORDER INSERTION;  Surgeon: Deboraha Sprang, MD;  Location: [facility];  Service: Cardiovascular;  Laterality: N/A;  ? ?FAMILY HISTORY ?Family History  ?Problem Relation Age of Onset  ? COPD Mother   ? Diabetes Mother   ? Dementia Father   ? Diabetes Father   ? Cancer Father   ?     prostate and lung  ? Colon cancer Neg Hx   ? Esophageal cancer Neg Hx   ? Stomach cancer Neg Hx   ? Rectal cancer Neg Hx   ? ? ?SOCIAL HISTORY ?Social History  ? ?Tobacco Use  ? Smoking status: Never  ? Smokeless tobacco: Never  ?Vaping Use  ? Vaping Use: Never used  ?Substance Use Topics  ? Alcohol use: Never  ? Drug use: Never  ?  ? ?  ?OPHTHALMIC EXAM: ?Base Eye Exam   ? ? Visual Acuity (Snellen - Linear)   ? ?   Right Left  ? Dist [place] 20/100 -1 20/30 +2  ? Dist ph [place] 20/40 +2 NI  ? ?  ?  ? ? Tonometry (Tonopen, 8:31 AM)   ? ?   Right Left  ? Pressure 21 19  ? ?  ?  ? ? Pupils   ? ?   Pupils Dark Light Shape React APD  ? Right PERRL 3 2 Round Brisk None  ? Left PERRL 3 2 Round Brisk None  ? ?  ?  ? ? Visual Fields   ? ?   Left Right  ?  Full Full  ? ?  ?  ? ? Extraocular Movement   ? ?   Right Left  ?  Full, Ortho Full, Ortho  ? ?  ?  ? ? Neuro/Psych   ? ? Oriented x3: Yes  ? Mood/Affect: Normal  ? ?  ?  ? ? Dilation   ? ? Both eyes: 2.5% Phenylephrine, 1.0% Mydriacyl @ 8:22 AM  ? ?  ?  ? ?  ? ?Slit Lamp and Fundus Exam   ? ? Slit Lamp Exam   ? ?   Right Left  ? Lids/Lashes Dermatochalasis - upper lid Dermatochalasis - upper lid  ? Conjunctiva/Sclera Nasal and temporal Pinguecula, mild Melanosis,  Nasal and temporal Pinguecula, mild Melanosis, Subconjunctival hemorrhage nasally and inferiorly  ? Cornea Arcus, 1+ Punctate epithelial erosions,  dry tear film, decreased TBUT Mild Arcus, 1+ Punctate epithelial erosions, mild debris in tear film  ? Anterior Chamber Deep and quiet Deep and quiet  ? Iris Round and dilated, No NVI Round and dilated, No  NVI  ? Lens 2-3+ Nuclear sclerosis, 2-3+ Cortical cataract 2-3+ Nuclear sclerosis, 2-3+ Cortical cataract  ? Anterior Vitreous Vitreous syneresis Vitreous syneresis, Posterior vitreous detachment  ? ?  ?  ? ? Fundus Exam   ? ?   Right Left  ? Disc sharp rim, mild pallor, +cupping Sharp, mild Pallor  ? C/D Ratio 0.6 0.5  ? Macula Flat, good foveal reflex, scattered MA/DBH greatest superior mac -- improved, scattered, cystic changes -- improved, No frank edema Flat, good foveal reflex, scattered Microaneurysms and exudate--improved, trace cystic changes IT macula -- improved  ? Vessels Vascular attenuation, Tortuous, severe attenuation temporal periphery  Vascular attenuation, Tortuous, copper wiring, AV crossing changes  ? Periphery Attached, operculated hole at 1000 with partial pigment and +cuff of SRF--good laser changes surrounding, scattered MA, White without pressure temporal periphery Attached, scattered IRH and CWS, White without pressure temporally, focal pigmented CR scar at 0130 equator, good peripheral 360 PRP  ? ?  ?  ? ?  ? ?Refraction   ? ? Manifest Refraction   ? ?   Sphere Cylinder Axis Dist VA  ? Right -1.75 +1.00 025 20/30-1  ? Left -1.25 +1.00 160 20/25+2  ? ?  ?  ? ?  ? ?IMAGING AND PROCEDURES  ?Imaging and Procedures for _0 @ ? ?OCT, Retina - OU - Both Eyes   ? ?   ?Right Eye ?Quality was good. Central Foveal Thickness: 227. Progression has been stable. Findings include normal foveal contour, no SRF, no IRF (mild diffuse retinal thinning -- stable).  ? ?Left Eye ?Quality was good. Central Foveal Thickness: 234. Progression has improved. Findings include normal foveal contour, intraretinal hyper-reflective material, no SRF, no IRF (Trace persistent cystic changes IT mac -- improved).   ? ?Notes ?*Images captured and stored on drive ? ?Diagnosis / Impression:  ?OD: NFP, no IRF/SRF -- Mild diffuse retinal thinning -- stable ?OS: NFP, no SRF, Trace persistent cystic changes IT mac -- improved ?  ?Cli

## 2022-04-10 ENCOUNTER — Ambulatory Visit (INDEPENDENT_AMBULATORY_CARE_PROVIDER_SITE_OTHER): Payer: Medicare HMO | Admitting: Ophthalmology

## 2022-04-10 ENCOUNTER — Encounter (INDEPENDENT_AMBULATORY_CARE_PROVIDER_SITE_OTHER): Payer: Self-pay | Admitting: Ophthalmology

## 2022-04-10 DIAGNOSIS — I10 Essential (primary) hypertension: Secondary | ICD-10-CM

## 2022-04-10 DIAGNOSIS — H25813 Combined forms of age-related cataract, bilateral: Secondary | ICD-10-CM

## 2022-04-10 DIAGNOSIS — H04123 Dry eye syndrome of bilateral lacrimal glands: Secondary | ICD-10-CM

## 2022-04-10 DIAGNOSIS — E113313 Type 2 diabetes mellitus with moderate nonproliferative diabetic retinopathy with macular edema, bilateral: Secondary | ICD-10-CM | POA: Diagnosis not present

## 2022-04-10 DIAGNOSIS — H33321 Round hole, right eye: Secondary | ICD-10-CM

## 2022-04-10 DIAGNOSIS — H35033 Hypertensive retinopathy, bilateral: Secondary | ICD-10-CM | POA: Diagnosis not present

## 2022-04-15 ENCOUNTER — Encounter (INDEPENDENT_AMBULATORY_CARE_PROVIDER_SITE_OTHER): Payer: Self-pay | Admitting: Ophthalmology

## 2022-04-28 ENCOUNTER — Encounter (HOSPITAL_BASED_OUTPATIENT_CLINIC_OR_DEPARTMENT_OTHER): Payer: Medicare HMO | Attending: General Surgery | Admitting: General Surgery

## 2022-04-28 DIAGNOSIS — Z8673 Personal history of transient ischemic attack (TIA), and cerebral infarction without residual deficits: Secondary | ICD-10-CM | POA: Insufficient documentation

## 2022-04-28 DIAGNOSIS — I872 Venous insufficiency (chronic) (peripheral): Secondary | ICD-10-CM | POA: Insufficient documentation

## 2022-04-28 DIAGNOSIS — I890 Lymphedema, not elsewhere classified: Secondary | ICD-10-CM | POA: Insufficient documentation

## 2022-04-28 DIAGNOSIS — R600 Localized edema: Secondary | ICD-10-CM | POA: Diagnosis not present

## 2022-04-28 DIAGNOSIS — E11319 Type 2 diabetes mellitus with unspecified diabetic retinopathy without macular edema: Secondary | ICD-10-CM | POA: Diagnosis not present

## 2022-04-28 NOTE — Progress Notes (Signed)
[name], [name] ([id number]) ?Visit Report for 04/28/2022 ?Allergy List Details ?Patient Name: Date of Service: ?[name], [name] 04/28/2022 8:00 A M ?Medical Record Number: [id number] ?Patient Account Number: [id number] ?Date of Birth/Sex: Treating RN: ?Sep 25, 1951 (71 y.o. Jerilynn Mages) Dellie Catholic ?Primary Care Aubrey Voong: [name] Folks Other Clinician: ?Referring Johniya Durfee: ?Treating Terrea Bruster/Extender: Fredirick Maudlin ?[name] Folks ?Weeks in Treatment: 0 ?Allergies ?Active Allergies ?Shellfish Containing Products ?Severity: Severe ?Allergy Notes ?Electronic Signature(s) ?Signed: 04/28/2022 5:56:38 PM By: Dellie Catholic RN ?Entered By: Dellie Catholic on 04/28/2022 07:57:02 ?-------------------------------------------------------------------------------- ?Arrival Information Details ?Patient Name: Date of Service: ?[name], [name] 04/28/2022 8:00 A M ?Medical Record Number: [id number] ?Patient Account Number: [id number] ?Date of Birth/Sex: Treating RN: ?19-Jun-1951 (71 y.o. Jerilynn Mages) Dellie Catholic ?Primary Care Leonore Frankson: [name] Folks Other Clinician: ?Referring Embrie Mikkelsen: ?Treating Dusty Wagoner/Extender: Fredirick Maudlin ?[name] Folks ?Weeks in Treatment: 0 ?Visit Information ?Patient Arrived: Ambulatory ?Arrival Time: 08:15 ?Accompanied By: daughter ?Transfer Assistance: None ?Patient Identification Verified: Yes ?Electronic Signature(s) ?Signed: 04/28/2022 5:56:38 PM By: Dellie Catholic RN ?Entered By: Dellie Catholic on 04/28/2022 08:24:50 ?-------------------------------------------------------------------------------- ?Clinic Level of Care Assessment Details ?Patient Name: Date of Service: ?[name], [name] 04/28/2022 8:00 A M ?Medical Record Number: [id number] ?Patient Account Number: [id number] ?Date of Birth/Sex: Treating RN: ?1951/04/12 (71 y.o. Jerilynn Mages) Dellie Catholic ?Primary Care Breyona Swander: [name] Folks Other Clinician: ?Referring Maricel Swartzendruber: ?Treating Nikya Busler/Extender: Fredirick Maudlin ?[name] Folks ?Weeks in Treatment: 0 ?Clinic Level of Care  Assessment Items ?TOOL 4 Quantity Score ?X- 1 0 ?Use when only an EandM is performed on FOLLOW-UP visit ?ASSESSMENTS - Nursing Assessment / Reassessment ?X- 1 10 ?Reassessment of Co-morbidities (includes updates in patient status) ?X- 1 5 ?Reassessment of Adherence to Treatment Plan ?ASSESSMENTS - Wound and Skin A ssessment / Reassessment ?[]  - 0 ?Simple Wound Assessment / Reassessment - one wound ?[]  - 0 ?Complex Wound Assessment / Reassessment - multiple wounds ?X- 1 10 ?Dermatologic / Skin Assessment (not related to wound area) ?ASSESSMENTS - Focused Assessment ?X- 2 5 ?Circumferential Edema Measurements - multi extremities ?[]  - 0 ?Nutritional Assessment / Counseling / Intervention ?X- 1 5 ?Lower Extremity Assessment (monofilament, tuning fork, pulses) ?[]  - 0 ?Peripheral Arterial Disease Assessment (using hand held doppler) ?ASSESSMENTS - Ostomy and/or Continence Assessment and Care ?[]  - 0 ?Incontinence Assessment and Management ?[]  - 0 ?Ostomy Care Assessment and Management (repouching, etc.) ?PROCESS - Coordination of Care ?[]  - 0 ?Simple Patient / Family Education for ongoing care ?[]  - 0 ?Complex (extensive) Patient / Family Education for ongoing care ?[]  - 0 ?Staff obtains Consents, Records, T Results / Process Orders ?est ?[]  - 0 ?Staff telephones HHA, Nursing Homes / Clarify orders / etc ?X- 1 10 ?Routine Transfer to another Facility (non-emergent condition) ?X- 1 10 ?Routine Hospital Admission (non-emergent condition) ?X- 1 15 ?New Admissions / Biomedical engineer / Ordering NPWT Apligraf, etc. ?, ?[]  - 0 ?Emergency Hospital Admission (emergent condition) ?[]  - 0 ?Simple Discharge Coordination ?[]  - 0 ?Complex (extensive) Discharge Coordination ?PROCESS - Special Needs ?[]  - 0 ?Pediatric / Minor Patient Management ?[]  - 0 ?Isolation Patient Management ?[]  - 0 ?Hearing / Language / Visual special needs ?[]  - 0 ?Assessment of Community assistance (transportation, D/C planning, etc.) ?X- 1  15 ?Additional assistance / Altered mentation ?[]  - 0 ?Support Surface(s) Assessment (bed, cushion, seat, etc.) ?INTERVENTIONS - Wound Cleansing / Measurement ?[]  - 0 ?Simple Wound Cleansing - one wound ?[]  - 0 ?Complex Wound Cleansing - multiple wounds ?[]  - 0 ?Wound Imaging (photographs - any number of wounds) ?[]  - 0 ?Wound Tracing (instead  of photographs) ?[]  - 0 ?Simple Wound Measurement - one wound ?[]  - 0 ?Complex Wound Measurement - multiple wounds ?INTERVENTIONS - Wound Dressings ?[]  - 0 ?Small Wound Dressing one or multiple wounds ?[]  - 0 ?Medium Wound Dressing one or multiple wounds ?[]  - 0 ?Large Wound Dressing one or multiple wounds ?[]  - 0 ?Application of Medications - topical ?[]  - 0 ?Application of Medications - injection ?INTERVENTIONS - Miscellaneous ?[]  - 0 ?External ear exam ?[]  - 0 ?Specimen Collection (cultures, biopsies, blood, body fluids, etc.) ?[]  - 0 ?Specimen(s) / Culture(s) sent or taken to Lab for analysis ?[]  - 0 ?Patient Transfer (multiple staff / Civil Service fast streamer / Similar devices) ?[]  - 0 ?Simple Staple / Suture removal (25 or less) ?[]  - 0 ?Complex Staple / Suture removal (26 or more) ?[]  - 0 ?Hypo / Hyperglycemic Management (close monitor of Blood Glucose) ?X- 1 15 ?Ankle / Brachial Index (ABI) - do not check if billed separately ?X- 1 5 ?Vital Signs ?Has the patient been seen at the hospital within the last three years: Yes ?Total Score: 110 ?Level Of Care: New/Established - Level 3 ?Electronic Signature(s) ?Signed: 04/28/2022 5:56:38 PM By: Dellie Catholic RN ?Entered By: Dellie Catholic on 04/28/2022 16:57:14 ?-------------------------------------------------------------------------------- ?Encounter Discharge Information Details ?Patient Name: Date of Service: ?[name], [name] 04/28/2022 8:00 A M ?Medical Record Number: [id number] ?Patient Account Number: [id number] ?Date of Birth/Sex: Treating RN: ?[birth year] (71 y.o. Jerilynn Mages) Dellie Catholic ?Primary Care Dorothy Landgrebe: [name] Folks Other  Clinician: ?Referring Syndi Pua: ?Treating Abiageal Blowe/Extender: Fredirick Maudlin ?[name] Folks ?Weeks in Treatment: 0 ?Encounter Discharge Information Items ?Discharge Condition: Stable ?Ambulatory Status: Ambulatory ?Discharge Destination: Home ?Transportation: Private Auto ?Accompanied By: Spouse ?Schedule Follow-up Appointment: Yes ?Clinical Summary of Care: Patient Declined ?Electronic Signature(s) ?Signed: 04/28/2022 5:56:38 PM By: Dellie Catholic RN ?Entered By: Dellie Catholic on 04/28/2022 16:58:10 ?-------------------------------------------------------------------------------- ?Lower Extremity Assessment Details ?Patient Name: ?Date of Service: ?[name], [name] 04/28/2022 8:00 A M ?Medical Record Number: [id number] ?Patient Account Number: [id number] ?Date of Birth/Sex: ?Treating RN: ?Apr 14, 1951 (71 y.o. Jerilynn Mages) Dellie Catholic ?Primary Care Grenda Lora: [name] Folks ?Other Clinician: ?Referring Lamisha Roussell: ?Treating Carah Barrientes/Extender: Fredirick Maudlin ?[name] Folks ?Weeks in Treatment: 0 ?Edema Assessment ?Assessed: [Left: No] [Right: No] ?Edema: [Left: Yes] [Right: Yes] ?Calf ?Left: Right: ?Point of Measurement: 31 cm From Medial Instep 42 cm 40 cm ?Ankle ?Left: Right: ?Point of Measurement: 10 cm From Medial Instep 25 cm 24 cm ?Knee To Floor ?Left: Right: ?From Medial Instep 46 cm 46 cm ?Vascular Assessment ?Blood Pressure: ?Brachial: [Left:170] [Right:170] ?Ankle: ?[Left:Dorsalis Pedis: 142 0.84] ?[Right:Dorsalis Pedis: 134 0.79] ?Electronic Signature(s) ?Signed: 04/28/2022 5:56:38 PM By: Dellie Catholic RN ?Entered By: Dellie Catholic on 04/28/2022 08:41:04 ?-------------------------------------------------------------------------------- ?Multi Wound Chart Details ?Patient Name: ?Date of Service: ?[name], [name] 04/28/2022 8:00 A M ?Medical Record Number: [id number] ?Patient Account Number: [id number] ?Date of Birth/Sex: ?Treating RN: ?Jul 25, 1951 (71 y.o. Jerilynn Mages) Dellie Catholic ?Primary Care Joplin Canty: [name] Folks ?Other  Clinician: ?Referring Doreena Maulden: ?Treating Elania Crowl/Extender: Fredirick Maudlin ?[name] Folks ?Weeks in Treatment: 0 ?Vital Signs ?Height(in): 67 ?Pulse(bpm): 79 ?Weight(lbs): 250 ?Blood Pressure(mmHg): 170/74 ?Body Mass In

## 2022-04-28 NOTE — Progress Notes (Signed)
Hegstrom, Shloimy ([id number]) ?Visit Report for 04/28/2022 ?Abuse Risk Screen Details ?Patient Name: Date of Service: ?[name] 04/28/2022 8:00 A M ?Medical Record Number: [id number] ?Patient Account Number: [id number] ?Date of Birth/Sex: Treating RN: ?08/12/1951 (71 y.o. Judie Petit) Karie Schwalbe ?Primary Care [name]: [name] Other Clinician: ?Referring Malone Admire: ?Treating Isrrael Fluckiger/Extender: Duanne Guess ?[name] ?Weeks in Treatment: 0 ?Abuse Risk Screen Items ?Answer ?ABUSE RISK SCREEN: ?Has anyone close to you tried to hurt or harm you recentlyo No ?Do you feel uncomfortable with anyone in your familyo No ?Has anyone forced you do things that you didnt want to doo No ?Electronic Signature(s) ?Signed: 04/28/2022 5:56:38 PM By: Karie Schwalbe RN ?Entered By: Karie Schwalbe on 04/28/2022 08:25:50 ?-------------------------------------------------------------------------------- ?Activities of Daily Living Details ?Patient Name: Date of Service: ?[name] 04/28/2022 8:00 A M ?Medical Record Number: [id number] ?Patient Account Number: [id number] ?Date of Birth/Sex: Treating RN: ?[birth year] (71 y.o. Judie Petit) Karie Schwalbe ?Primary Care [name]: [name] Other Clinician: ?Referring Terik Haughey: ?Treating Rinaldo Macqueen/Extender: Duanne Guess ?[name] ?Weeks in Treatment: 0 ?Activities of Daily Living Items ?Answer ?Activities of Daily Living (Please select one for each item) ?Drive Automobile Not Able ?T Medications ?ake Completely Able ?Use T elephone Completely Able ?Care for Appearance Completely Able ?Use T oilet Completely Able ?Bath / Shower Completely Able ?Dress Self Completely Able ?Feed Self Completely Able ?Walk Completely Able ?Get In / Out Bed Completely Able ?Housework Completely Able ?Prepare Meals Completely Able ?Handle Money Completely Able ?Shop for Self Completely Able ?Electronic Signature(s) ?Signed: 04/28/2022 5:56:38 PM By: Karie Schwalbe RN ?Entered By: Karie Schwalbe on  04/28/2022 08:27:10 ?-------------------------------------------------------------------------------- ?Education Screening Details ?Patient Name: ?Date of Service: ?[name] 04/28/2022 8:00 A M ?Medical Record Number: [id number] ?Patient Account Number: [id number] ?Date of Birth/Sex: ?Treating RN: ?Oct 08, 1951 (71 y.o. Judie Petit) Karie Schwalbe ?Primary Care [name]: [name] ?Other Clinician: ?Referring Lucielle Vokes: ?Treating Deslyn Cavenaugh/Extender: Duanne Guess ?[name] ?Weeks in Treatment: 0 ?Learning Preferences/Education Level/Primary Language ?Learning Preference: Explanation, Demonstration, Printed Material ?Highest Education Level: High School ?Preferred Language: English ?Cognitive Barrier ?Language Barrier: No ?Translator Needed: No ?Memory Deficit: No ?Emotional Barrier: No ?Cultural/Religious Beliefs Affecting Medical Care: No ?Physical Barrier ?Impaired Vision: No ?Impaired Hearing: No ?Decreased Hand dexterity: No ?Knowledge/Comprehension ?Knowledge Level: High ?Comprehension Level: High ?Ability to understand written instructions: High ?Ability to understand verbal instructions: High ?Motivation ?Anxiety Level: Calm ?Cooperation: Cooperative ?Education Importance: Acknowledges Need ?Interest in Health Problems: Asks Questions ?Perception: Coherent ?Willingness to Engage in Self-Management High ?Activities: ?Readiness to Engage in Self-Management High ?Activities: ?Electronic Signature(s) ?Signed: 04/28/2022 5:56:38 PM By: Karie Schwalbe RN ?Entered By: Karie Schwalbe on 04/28/2022 08:27:48 ?-------------------------------------------------------------------------------- ?Fall Risk Assessment Details ?Patient Name: ?Date of Service: ?[name] 04/28/2022 8:00 A M ?Medical Record Number: [id number] ?Patient Account Number: [id number] ?Date of Birth/Sex: ?Treating RN: ?10/03/1951 (71 y.o. Judie Petit) Karie Schwalbe ?Primary Care [name]: [name] ?Other Clinician: ?Referring Reyes Aldaco: ?Treating  Minahil Quinlivan/Extender: Duanne Guess ?[name] ?Weeks in Treatment: 0 ?Fall Risk Assessment Items ?Have you had 2 or more falls in the last 12 monthso 0 No ?Have you had any fall that resulted in injury in the last 12 monthso 0 No ?FALLS RISK SCREEN ?History of falling - immediate or within 3 months 0 No ?Secondary diagnosis (Do you have 2 or more medical diagnoseso) 0 No ?Ambulatory aid ?None/bed rest/wheelchair/nurse 0 No ?Crutches/cane/walker 0 No ?Furniture 0 No ?Intravenous therapy Access/Saline/Heparin Lock 0 No ?Gait/Transferring ?Normal/ bed rest/ wheelchair 0 No ?Weak (short steps with or without shuffle, stooped but able to lift  head while walking, may seek 0 No ?support from furniture) ?Impaired (short steps with shuffle, may have difficulty arising from chair, head down, impaired 0 No ?balance) ?Mental Status ?Oriented to own ability 0 No ?Electronic Signature(s) ?Signed: 04/28/2022 5:56:38 PM By: Karie Schwalbe RN ?Entered By: Karie Schwalbe on 04/28/2022 08:28:02 ?-------------------------------------------------------------------------------- ?Foot Assessment Details ?Patient Name: ?Date of Service: ?[name] 04/28/2022 8:00 A M ?Medical Record Number: [id number] ?Patient Account Number: [id number] ?Date of Birth/Sex: ?Treating RN: ?[birth year] (71 y.o. Judie Petit) Karie Schwalbe ?Primary Care Yakir Wenke: [name] ?Other Clinician: ?Referring Arlind Klingerman: ?Treating Adaysha Dubinsky/Extender: Duanne Guess ?[name] ?Weeks in Treatment: 0 ?Foot Assessment Items ?Site Locations ?+ = Sensation present, - = Sensation absent, C = Callus, U = Ulcer ?R = Redness, W = Warmth, M = Maceration, PU = Pre-ulcerative lesion ?F = Fissure, S = Swelling, D = Dryness ?Assessment ?Right: Left: ?Other Deformity: No No ?Prior Foot Ulcer: No No ?Prior Amputation: No No ?Charcot Joint: No No ?Ambulatory Status: Ambulatory Without Help ?Gait: Steady ?Electronic Signature(s) ?Signed: 04/28/2022 5:56:38 PM By: Karie Schwalbe  RN ?Entered By: Karie Schwalbe on 04/28/2022 08:31:18 ?-------------------------------------------------------------------------------- ?Nutrition Risk Screening Details ?Patient Name: ?Date of Service: ?[name] 04/28/2022 8:00 A M ?Medical Record Number: [id number] ?Patient Account Number: [id number] ?Date of Birth/Sex: ?Treating RN: ?Apr 02, 1951 (71 y.o. Judie Petit) Karie Schwalbe ?Primary Care Aubria Vanecek: [name] ?Other Clinician: ?Referring Shedrick Sarli: ?Treating Shalona Harbour/Extender: Duanne Guess ?[name] ?Weeks in Treatment: 0 ?Height (in): 67 ?Weight (lbs): 250 ?Body Mass Index (BMI): 39.2 ?Nutrition Risk Screening Items ?Score Screening ?NUTRITION RISK SCREEN: ?I have an illness or condition that made me change the kind and/or amount of food I eat 0 No ?I eat fewer than two meals per day 0 No ?I eat few fruits and vegetables, or milk products 0 No ?I have three or more drinks of beer, liquor or wine almost every day 0 No ?I have tooth or mouth problems that make it hard for me to eat 0 No ?I don't always have enough money to buy the food I need 0 No ?I eat alone most of the time 0 No ?I take three or more different prescribed or over-the-counter drugs a day 0 No ?Without wanting to, I have lost or gained 10 pounds in the last six months 0 No ?I am not always physically able to shop, cook and/or feed myself 0 No ?Nutrition Protocols ?Good Risk Protocol 0 No interventions needed ?Moderate Risk Protocol ?High Risk Proctocol ?Risk Level: Good Risk ?Score: 0 ?Electronic Signature(s) ?Signed: 04/28/2022 5:56:38 PM By: Karie Schwalbe RN ?Entered By: Karie Schwalbe on 04/28/2022 08:28:18 ?

## 2022-04-29 NOTE — Progress Notes (Signed)
[name] [name] ([id number]) ?Visit Report for 04/28/2022 ?Chief Complaint Document Details ?Patient Name: Date of Service: ?[name] [name] [name] 04/28/2022 8:00 A M ?Medical Record Number: [id number] ?Patient Account Number: [id number] ?Date of Birth/Sex: Treating RN: ?11-May-1951 (71 y.o. Judie Petit) [name] [name] ?Primary Care Provider: Fatima [name] Other Clinician: ?Referring Provider: ?Treating Provider/Extender: [name] [name] ?[name] [name] ?Weeks in Treatment: 0 ?Information Obtained from: Patient ?Chief Complaint ?Patient presents to the wound care center due with non-wound condition(s) ?Electronic Signature(s) ?Signed: 04/28/2022 8:53:37 AM By: [name] Guess MD FACS ?Entered By: [name] [name] on 04/28/2022 08:53:37 ?-------------------------------------------------------------------------------- ?HPI Details ?Patient Name: Date of Service: ?[name] [name] [name] 04/28/2022 8:00 A M ?Medical Record Number: [id number] ?Patient Account Number: [id number] ?Date of Birth/Sex: Treating RN: ?January 24, 1951 (71 y.o. Judie Petit) [name] [name] ?Primary Care Provider: Fatima [name] Other Clinician: ?Referring Provider: ?Treating Provider/Extender: [name] [name] ?[name] [name] ?Weeks in Treatment: 0 ?History of Present Illness ?HPI Description: CONSULT ONLY ?04/28/2022 ?This is a 71 year old man with a past medical history notable for type 2 diabetes mellitus, stroke, and lower extremity edema. Apparently back in January, he ?did have a wound on his right lower extremity and was seen in the emergency department for this. I am not entirely sure how he ultimately was referred to the ?wound care center, but today he has no open wounds. He does have significant bilateral lower extremity edema, 2+ up to the knees. He does not wear ?compression stockings. He says that he applies Vaseline to his legs daily, but the skin is quite dry and scaly. ABIs in clinic today were 0.79 and 0.84. ?Electronic Signature(s) ?Signed: 04/28/2022 8:55:33 AM By: [name] Guess  MD FACS ?Entered By: [name] [name] on 04/28/2022 08:55:32 ?-------------------------------------------------------------------------------- ?Physical Exam Details ?Patient Name: Date of Service: ?[name] [name] [name] 04/28/2022 8:00 A M ?Medical Record Number: [id number] ?Patient Account Number: [id number] ?Date of Birth/Sex: Treating RN: ?Aug 07, 1951 (71 y.o. Judie Petit) [name] [name] ?Primary Care Provider: Fatima [name] Other Clinician: ?Referring Provider: ?Treating Provider/Extender: [name] [name] ?[name] [name] ?Weeks in Treatment: 0 ?Constitutional ?He is hypertensive.. . . . No acute distress. ?Respiratory ?Normal work of breathing on room air.Marland Kitchen ?Cardiovascular ?2+ pitting edema to the bilateral lower extremities.Marland Kitchen ?Notes ?04/28/2022: The patient has no open wounds at this time. He does have significant bilateral lower extremity edema with some woodiness to his skin, suggestive ?of venous insufficiency but also potential lymphedema. Skin is dry. ?Electronic Signature(s) ?Signed: 04/28/2022 8:58:59 AM By: [name] Guess MD FACS ?Entered By: [name] [name] on 04/28/2022 08:58:59 ?-------------------------------------------------------------------------------- ?Physician Orders Details ?Patient Name: Date of Service: ?[name] [name] [name] 04/28/2022 8:00 A M ?Medical Record Number: [id number] ?Patient Account Number: [id number] ?Date of Birth/Sex: Treating RN: ?1951/12/08 (71 y.o. Judie Petit) [name] [name] ?Primary Care Provider: Fatima [name] Other Clinician: ?Referring Provider: ?Treating Provider/Extender: [name] [name] ?[name] [name] ?Weeks in Treatment: 0 ?Verbal / Phone Orders: No ?Diagnosis Coding ?ICD-10 Coding ?Code Description ?I89.0 Lymphedema, not elsewhere classified ?I87.2 Venous insufficiency (chronic) (peripheral) ?E11.9 Type 2 diabetes mellitus without complications ?I63.9 Cerebral infarction, unspecified ?Discharge From [facility] ?Discharge from Wound Care Center - In the future, If you have any wounds please call  the wound clinic. A referral was sent to the Lymphdema ?Clinic. The Clinic should call you in the near future. ?Edema Control - Lymphedema / SCD / Other ?Bilateral Lower Extremities ?Avoid standing for long periods of time. ?Patient to wear own compression stockings every day. ?Moisturize legs daily. - For Example Aquaphor (lotion) is an excellent moisturizer ?Compression stocking or Garment 20-30 mm/Hg pressure to: ?Non Wound Condition ?Bilateral  Lower Extremities ?pply the following to affected area as directed: - The Lotions Aquaphor or Eucerin for example ?A ?Services and Therapies ?Lymphedema Clinic - (ICD10 I87.2 - Venous insufficiency (chronic) (peripheral)) ?Electronic Signature(s) ?Signed: 04/28/2022 12:32:15 PM By: [name] Guess MD FACS ?Signed: 04/28/2022 5:56:38 PM By: [name] Schwalbe RN ?Previous Signature: 04/28/2022 8:59:21 AM Version By: [name] Guess MD FACS ?Entered By: [name] [name] on 04/28/2022 11:21:34 ?Prescription 04/28/2022 ?-------------------------------------------------------------------------------- ?Hufstetler, Diannia Ruder MD ?Patient Name: ?Provider: ?19-Jul-1951 [id number] ?Date of Birth: ?NPI#: ?Judie Petit [id number] ?Sex: ?DEA #: ?[phone] ?Phone #: ?License #: ?Eligha Bridegroom [facility] ?Patient Address: ?[address] [place] ?[place], [place] [address] [address] ?[place], [place] [address] ?[phone] ?Allergies ?Shellfish Containing Products ?Provider's Orders ?Lymphedema Clinic - ICD10: I87.2 ?Hand Signature: ?Date(s): ?Electronic Signature(s) ?Signed: 04/28/2022 12:32:15 PM By: [name] Guess MD FACS ?Signed: 04/28/2022 5:56:38 PM By: [name] Schwalbe RN ?Entered By: [name] [name] on 04/28/2022 11:21:34 ?-------------------------------------------------------------------------------- ?Problem List Details ?Patient Name: ?Date of Service: ?[name] [name] [name] 04/28/2022 8:00 A M ?Medical Record Number: [id number] ?Patient Account  Number: [id number] ?Date of Birth/Sex: ?Treating RN: ?04/25/1951 (71 y.o. Judie Petit) [name] [name] ?Primary Care Provider: Fatima [name] ?Other Clinician: ?Referring Provider: ?Treating Provider/Extender: [name] [name] ?[name] [name] ?Weeks in Treatment: 0 ?Active Problems ?ICD-10 ?Encounter ?Code Description Active Date MDM ?Diagnosis ?I89.0 Lymphedema, not elsewhere classified 04/28/2022 No Yes ?I87.2 Venous insufficiency (chronic) (peripheral) 04/28/2022 No Yes ?E11.9 Type 2 diabetes mellitus without complications 04/28/2022 No Yes ?I63.9 Cerebral infarction, unspecified 04/28/2022 No Yes ?Inactive Problems ?Resolved Problems ?Electronic Signature(s) ?Signed: 04/28/2022 8:53:17 AM By: [name] Guess MD FACS ?Entered By: [name] [name] on 04/28/2022 08:53:17 ?-------------------------------------------------------------------------------- ?Progress Note Details ?Patient Name: ?Date of Service: ?[name] [name] [name] 04/28/2022 8:00 A M ?Medical Record Number: [id number] ?Patient Account Number: [id number] ?Date of Birth/Sex: ?Treating RN: ?June 09, 1951 (71 y.o. Judie Petit) [name] [name] ?Primary Care Provider: Fatima [name] ?Other Clinician: ?Referring Provider: ?Treating Provider/Extender: [name] [name] ?[name] [name] ?Weeks in Treatment: 0 ?Subjective ?Chief Complaint ?Information obtained from Patient ?Patient presents to the wound care center due with non-wound condition(s) ?History of Present Illness (HPI) ?CONSULT ONLY ?04/28/2022 ?This is a 71 year old man with a past medical history notable for type 2 diabetes mellitus, stroke, and lower extremity edema. Apparently back in January, he ?did have a wound on his right lower extremity and was seen in the emergency department for this. I am not entirely sure how he ultimately was referred to the ?wound care center, but today he has no open wounds. He does have significant bilateral lower extremity edema, 2+ up to the knees. He does not wear ?compression stockings. He says that he applies  Vaseline to his legs daily, but the skin is quite dry and scaly. ABIs in clinic today were 0.79 and 0.84. ?Patient History ?Information obtained from Patient. ?Allergies ?Shellfish Containing Products [facility]

## 2022-05-09 ENCOUNTER — Encounter: Payer: Self-pay | Admitting: Neurology

## 2022-05-28 ENCOUNTER — Other Ambulatory Visit: Payer: Self-pay | Admitting: Adult Health

## 2022-05-30 ENCOUNTER — Ambulatory Visit: Payer: Medicare HMO | Admitting: Podiatry

## 2022-05-30 DIAGNOSIS — B351 Tinea unguium: Secondary | ICD-10-CM | POA: Diagnosis not present

## 2022-05-30 DIAGNOSIS — M79674 Pain in right toe(s): Secondary | ICD-10-CM

## 2022-05-30 DIAGNOSIS — M79675 Pain in left toe(s): Secondary | ICD-10-CM

## 2022-06-04 NOTE — Progress Notes (Signed)
  Subjective:  Patient ID: [name], male    DOB: 18-May-1951,  MRN: [id number]  Chief Complaint  Patient presents with   Nail Problem   71 y.o. male returns for the above complaint.  Patient presents with thickened elongated dystrophic toenails x10.  Patient is a diabetic with last A1c of 5.6.  Appears the patient is well controlled diabetic.  She would like to have her nails debrided down as she is not able to do it herself.  She denies any other acute complaints.  Objective:  There were no vitals filed for this visit. Podiatric Exam: Vascular: dorsalis pedis and posterior tibial pulses are palpable bilateral. Capillary return is immediate. Temperature gradient is WNL. Skin turgor WNL  Sensorium: Normal Semmes Weinstein monofilament test. Normal tactile sensation bilaterally. Nail Exam: Pt has thick disfigured discolored nails with subungual debris noted bilateral entire nail hallux through fifth toenails.  Pain on palpation to the nails. Ulcer Exam: There is no evidence of ulcer or pre-ulcerative changes or infection. Orthopedic Exam: Muscle tone and strength are WNL. No limitations in general ROM. No crepitus or effusions noted. HAV  B/L.  Hammer toes 2-5  B/L. Skin: No Porokeratosis. No infection or ulcers    Assessment & Plan:   1. Pain due to onychomycosis of toenails of both feet      Patient was evaluated and treated and all questions answered.  Onychomycosis with pain  -Nails palliatively debrided as below. -Educated on self-care  Procedure: Nail Debridement Rationale: pain  Type of Debridement: manual, sharp debridement. Instrumentation: Nail nipper, rotary burr. Number of Nails: 10  Procedures and Treatment: Consent by patient was obtained for treatment procedures. The patient understood the discussion of treatment and procedures well. All questions were answered thoroughly reviewed. Debridement of mycotic and hypertrophic toenails, 1 through 5 bilateral and  clearing of subungual debris. No ulceration, no infection noted.  Return Visit-Office Procedure: Patient instructed to return to the office for a follow up visit 3 months for continued evaluation and treatment.  Nicholes Rough, DPM    No follow-ups on file.

## 2022-07-09 NOTE — Progress Notes (Signed)
[facility] Note  07/11/2022     CHIEF COMPLAINT Patient presents for Retina Follow Up    HISTORY OF PRESENT ILLNESS: [name] is a 71 y.o. male who presents to the clinic today for:   HPI     Retina Follow Up   Patient presents with  Diabetic Retinopathy.  In both eyes.  Duration of 13 weeks.  Since onset it is stable.  I, the attending physician,  performed the HPI with the patient and updated documentation appropriately.        Comments   13 week follow up NPDR OU-  Eyes have been tearing and itching.  BS doesn't check      Last edited by [name], MD on 07/13/2022 11:55 PM.    Pts A1c was 6.0 in February, he states his BP and blood sugar are controlled, his vision is "okay"  Referring physician: [name], MD [address] [place],  [place] [address]  HISTORICAL INFORMATION:   Selected notes from the MEDICAL RECORD NUMBER Referred by Dr. Wyatt Portela for concern of HTN Ret   CURRENT MEDICATIONS: Current Outpatient Medications (Ophthalmic Drugs)  Medication Sig   Polyvinyl Alcohol-Povidone (REFRESH OP) Place 1 drop into both eyes in the morning and at bedtime.   No current facility-administered medications for this visit. (Ophthalmic Drugs)   Current Outpatient Medications (Other)  Medication Sig   aspirin 325 MG EC tablet aspirin 325 mg tablet,delayed release   atorvastatin (LIPITOR) 40 MG tablet Take 1 tablet (40 mg total) by mouth daily at 6 PM.   carvedilol (COREG) 6.25 MG tablet Take 6.25 mg by mouth 2 (two) times daily with a meal.   clindamycin (CLEOCIN) 300 MG capsule Take 300 mg by mouth every 6 (six) hours.   donepezil (ARICEPT) 10 MG tablet Take 1 tablet (10 mg total) by mouth at bedtime.   hydrochlorothiazide (HYDRODIURIL) 25 MG tablet Take 25 mg by mouth daily.   losartan (COZAAR) 100 MG tablet Take 100 mg by mouth daily.   losartan (COZAAR) 50 MG tablet    lovastatin (MEVACOR) 10 MG tablet  Take 1 tablet by mouth daily.   memantine (NAMENDA) 10 MG tablet Take 1 tablet (10 mg total) by mouth 2 (two) times daily.   metFORMIN (GLUCOPHAGE) 500 MG tablet Take by mouth 2 (two) times daily with a meal.   Multiple Vitamins-Minerals (ONE-A-DAY MENS 50+ PO) Take 1 tablet by mouth daily.   mupirocin ointment (BACTROBAN) 2 % SMARTSIG:1 Application Topical 2-3 Times Daily   Sodium Chloride Flush (NORMAL SALINE FLUSH) 0.9 % SOLN sodium chloride 0.9 % intravenous solution   500 mL by intraven. route.   Sodium Sulfate-Mag Sulfate-KCl (SUTAB) [id number] MG TABS Sutab 1.479-0.188-0.225 gram tablet   spironolactone (ALDACTONE) 25 MG tablet SMARTSIG:0.5 Tablet(s) By Mouth Every Other Day   PFIZER-BIONT COVID-19 VAC-TRIS SUSP injection    PFIZER-BIONTECH COVID-19 VACC 30 MCG/0.3ML injection    Current Facility-Administered Medications (Other)  Medication Route   0.9 %  sodium chloride infusion Intravenous   0.9 %  sodium chloride infusion Intravenous   REVIEW OF SYSTEMS: ROS   Positive for: Endocrine, Eyes Negative for: Constitutional, Gastrointestinal, Neurological, Skin, Genitourinary, Musculoskeletal, HENT, Cardiovascular, Respiratory, Psychiatric, Allergic/Imm, Heme/Lymph Last edited by Leonie Douglas, COA on 07/11/2022  8:08 AM.     ALLERGIES Allergies  Allergen Reactions   Shellfish Allergy Anaphylaxis   PAST MEDICAL HISTORY Past Medical History:  Diagnosis Date   Acute  cerebrovascular accident (CVA) ([place]) 01/20/2020   Benign essential HTN 01/20/2020   Cataract    Mixed form OU   Diabetic retinopathy ([place])    NPDR OU   Hypertensive retinopathy    OU   Seizures ([place])    per pt- "none since teenage years"   Past Surgical History:  Procedure Laterality Date   LOOP RECORDER INSERTION N/A 01/23/2020   Procedure: LOOP RECORDER INSERTION;  Surgeon: Deboraha Sprang, MD;  Location: [facility];  Service: Cardiovascular;  Laterality: N/A;   FAMILY HISTORY Family History   Problem Relation Age of Onset   COPD Mother    Diabetes Mother    Dementia Father    Diabetes Father    Cancer Father        prostate and lung   Colon cancer Neg Hx    Esophageal cancer Neg Hx    Stomach cancer Neg Hx    Rectal cancer Neg Hx    SOCIAL HISTORY Social History   Tobacco Use   Smoking status: Never   Smokeless tobacco: Never  Vaping Use   Vaping Use: Never used  Substance Use Topics   Alcohol use: Never   Drug use: Never       OPHTHALMIC EXAM: Base Eye Exam     Visual Acuity (Snellen - Linear)       Right Left   Dist [place] 20/70 -1 20/50   Dist ph [place] 20/50 20/30 +1         Tonometry (Tonopen, 8:16 AM)       Right Left   Pressure 16 15         Pupils       Dark Light Shape React APD   Right 3 2 Round Brisk None   Left 3 2 Round Brisk None         Visual Fields (Counting fingers)       Left Right    Full Full         Extraocular Movement       Right Left    Full Full         Neuro/Psych     Oriented x3: Yes   Mood/Affect: Normal         Dilation     Both eyes: 1.0% Mydriacyl, 2.5% Phenylephrine @ 8:16 AM           Slit Lamp and Fundus Exam     Slit Lamp Exam       Right Left   Lids/Lashes Dermatochalasis - upper lid, mild MGD Dermatochalasis - upper lid   Conjunctiva/Sclera Nasal and temporal Pinguecula, mild Melanosis,  Nasal and temporal Pinguecula, mild Melanosis   Cornea Arcus, focal 2-3+Punctate epithelial erosions and irregular epi nasally, mild dry tear film Arcus, 2+ Punctate epithelial erosions with irregular epi inferiorly, mild debris in tear film   Anterior Chamber Deep and quiet Deep and quiet   Iris Round and dilated, No NVI Round and dilated, No NVI   Lens 2-3+ Nuclear sclerosis, 2-3+ Cortical cataract 2-3+ Nuclear sclerosis, 2-3+ Cortical cataract   Anterior Vitreous Vitreous syneresis Vitreous syneresis, Posterior vitreous detachment         Fundus Exam       Right Left   Disc sharp  rim, mild pallor, +cupping Sharp, mild Pallor   C/D Ratio 0.6 0.5   Macula Flat, good foveal reflex, rare MA, scattered, cystic changes -- improved, No frank edema Flat, good foveal reflex, scattered Microaneurysms  and exudate -- improved -- now just rare MA, trace cystic changes IT macula -- improved   Vessels attenuated, mild tortuosity attenuated, Tortuous   Periphery Attached, operculated hole at 1000 with partial pigment and +cuff of SRF -- good laser changes surrounding, scattered MA, White without pressure temporal periphery Attached, scattered DBH, White without pressure temporally, focal pigmented CR scar at 0130 equator, good peripheral 360 PRP           IMAGING AND PROCEDURES  Imaging and Procedures for _0 @  OCT, Retina - OU - Both Eyes       Right Eye Quality was good. Central Foveal Thickness: 229. Progression has been stable. Findings include normal foveal contour, no IRF, no SRF (mild diffuse retinal thinning -- stable).   Left Eye Quality was good. Central Foveal Thickness: 240. Progression has improved. Findings include normal foveal contour, no IRF, no SRF, intraretinal hyper-reflective material (Interval improvement in cystic changes IT mac ).   Notes *Images captured and stored on drive  Diagnosis / Impression:  OD: NFP, no IRF/SRF -- Mild diffuse retinal thinning -- stable OS: NFP, no SRF, Interval improvement in cystic changes IT mac    Clinical management:  See below  Abbreviations: NFP - Normal foveal profile. CME - cystoid macular edema. PED - pigment epithelial detachment. IRF - intraretinal fluid. SRF - subretinal fluid. EZ - ellipsoid zone. ERM - epiretinal membrane. ORA - outer retinal atrophy. ORT - outer retinal tubulation. SRHM - subretinal hyper-reflective material            ASSESSMENT/PLAN:    ICD-10-CM   1. Moderate nonproliferative diabetic retinopathy of both eyes with macular edema associated with type 2 diabetes mellitus (HCC)   E11.3313 OCT, Retina - OU - Both Eyes    2. Retinal hole of right eye  H33.321     3. Essential hypertension  I10     4. Hypertensive retinopathy of both eyes  H35.033     5. Combined forms of age-related cataract of both eyes  H25.813     6. Dry eyes  H04.123       1. Moderate non-proliferative diabetic retinopathy, OU  - s/p IVA OS #1 (05.07.21), #2 (06.04.21), #3 (07.06.21), #4 (08.03.21), #5 (08.31.21), #6 (9.28.21), #7 (11.2.21), #8 (12.07.21), #9 (02.02.22), #10 (03.30.22), #11 (05.25.22), #12 (08.03.22), #13 (10.20.22), #14 (01.12.23)  - s/p PRP OS (04.09.21) -- good laser changes  - repeat FA (09.28.21) shows late leaking MA OU, significant capillary drop-out   - exam shows scattered MA/IRH/CWS OU -- improving   - BCVA OD 20/50, OS 20/30 -- both decreased (mostly cataract)  - OCT shows OD: mild diffuse retinal thinning -- stable; OS: Interval improvement in cystic changes IT mac at 3 months  - recommend holding IVA today  - pt in agreement  - f/u 6 months -- DFE/OCT/FA (transit OS)  2. Operculated retinal hole w/ cuff of SRF / focal RD, right eye  - operculated hole located at 1000 with partial pigment and +cuff of SRF / focal RD  - s/p retinopexy OD (03.17.21) -- good laser changes surrounding  - monitor   3,4. Hypertensive retinopathy OU  - discussed importance of tight BP control  - monitor   5. Mixed form age related cataract OU  - The symptoms of cataract, surgical options, and treatments and risks were discussed with patient.  - discussed diagnosis and progression  - under the expert management of Dr. Zenia Resides  - now that macular edema  is improved and relatively stable, pt is clear from a retina standpoint to proceed with cataract surgery when pt and surgeon are ready  6. Dry eyes OU  - recommend artificial tears and lubricating ointment as needed  Ophthalmic Meds Ordered this visit:  No orders of the defined types were placed in this encounter.    Return  in about 6 months (around 01/11/2023) for f/u NPDR OU, DFE, OCT, FA (transit OS).  There are no Patient Instructions on file for this visit.  Explained the diagnoses, plan, and follow up with the patient and they expressed understanding.  Patient expressed understanding of the importance of proper follow up care.   This document serves as a record of services personally performed by Gardiner Sleeper, MD, PhD. It was created on their behalf by Leonie Douglas, an ophthalmic technician. The creation of this record is the provider's dictation and/or activities during the visit.    Electronically signed by: Leonie Douglas COA, 07/13/22  11:56 PM  This document serves as a record of services personally performed by Gardiner Sleeper, MD, PhD. It was created on their behalf by San Jetty. Owens Shark, OA an ophthalmic technician. The creation of this record is the provider's dictation and/or activities during the visit.    Electronically signed by: San Jetty. Owens Shark, [place] 07.21.2023 11:56 PM  Gardiner Sleeper, M.D., Ph.D. Diseases & Surgery of the Retina and Vitreous [facility]  I have reviewed the above documentation for accuracy and completeness, and I agree with the above. Gardiner Sleeper, M.D., Ph.D. 07/13/22 11:58 PM   Abbreviations: M myopia (nearsighted); A astigmatism; H hyperopia (farsighted); P presbyopia; Mrx spectacle prescription;  CTL contact lenses; OD right eye; OS left eye; OU both eyes  XT exotropia; ET esotropia; PEK punctate epithelial keratitis; PEE punctate epithelial erosions; DES dry eye syndrome; MGD meibomian gland dysfunction; ATs artificial tears; PFAT's preservative free artificial tears; [place] nuclear sclerotic cataract; PSC posterior subcapsular cataract; ERM epi-retinal membrane; PVD posterior vitreous detachment; RD retinal detachment; DM diabetes mellitus; DR diabetic retinopathy; NPDR non-proliferative diabetic retinopathy; PDR proliferative diabetic retinopathy;  CSME clinically significant macular edema; DME diabetic macular edema; dbh dot blot hemorrhages; CWS cotton wool spot; POAG primary open angle glaucoma; C/D cup-to-disc ratio; HVF humphrey visual field; GVF goldmann visual field; OCT optical coherence tomography; IOP intraocular pressure; BRVO Branch retinal vein occlusion; CRVO central retinal vein occlusion; CRAO central retinal artery occlusion; BRAO branch retinal artery occlusion; RT retinal tear; SB scleral buckle; PPV pars plana vitrectomy; VH Vitreous hemorrhage; PRP panretinal laser photocoagulation; IVK intravitreal kenalog; VMT vitreomacular traction; MH Macular hole;  NVD neovascularization of the disc; NVE neovascularization elsewhere; AREDS age related eye disease study; ARMD age related macular degeneration; POAG primary open angle glaucoma; EBMD epithelial/anterior basement membrane dystrophy; ACIOL anterior chamber intraocular lens; IOL intraocular lens; PCIOL posterior chamber intraocular lens; Phaco/IOL phacoemulsification with intraocular lens placement; [place] photorefractive keratectomy; LASIK laser assisted in situ keratomileusis; HTN hypertension; DM diabetes mellitus; COPD chronic obstructive pulmonary disease

## 2022-07-10 ENCOUNTER — Encounter (INDEPENDENT_AMBULATORY_CARE_PROVIDER_SITE_OTHER): Payer: Medicare HMO | Admitting: Ophthalmology

## 2022-07-10 DIAGNOSIS — H33321 Round hole, right eye: Secondary | ICD-10-CM

## 2022-07-10 DIAGNOSIS — H35033 Hypertensive retinopathy, bilateral: Secondary | ICD-10-CM

## 2022-07-10 DIAGNOSIS — I10 Essential (primary) hypertension: Secondary | ICD-10-CM

## 2022-07-10 DIAGNOSIS — H25813 Combined forms of age-related cataract, bilateral: Secondary | ICD-10-CM

## 2022-07-10 DIAGNOSIS — H04123 Dry eye syndrome of bilateral lacrimal glands: Secondary | ICD-10-CM

## 2022-07-10 DIAGNOSIS — E113313 Type 2 diabetes mellitus with moderate nonproliferative diabetic retinopathy with macular edema, bilateral: Secondary | ICD-10-CM

## 2022-07-11 ENCOUNTER — Ambulatory Visit (INDEPENDENT_AMBULATORY_CARE_PROVIDER_SITE_OTHER): Payer: Medicare HMO | Admitting: Ophthalmology

## 2022-07-11 ENCOUNTER — Other Ambulatory Visit: Payer: Self-pay | Admitting: Adult Health

## 2022-07-11 ENCOUNTER — Encounter (INDEPENDENT_AMBULATORY_CARE_PROVIDER_SITE_OTHER): Payer: Self-pay | Admitting: Ophthalmology

## 2022-07-11 DIAGNOSIS — H33321 Round hole, right eye: Secondary | ICD-10-CM

## 2022-07-11 DIAGNOSIS — I10 Essential (primary) hypertension: Secondary | ICD-10-CM

## 2022-07-11 DIAGNOSIS — H25813 Combined forms of age-related cataract, bilateral: Secondary | ICD-10-CM

## 2022-07-11 DIAGNOSIS — H35033 Hypertensive retinopathy, bilateral: Secondary | ICD-10-CM | POA: Diagnosis not present

## 2022-07-11 DIAGNOSIS — E113313 Type 2 diabetes mellitus with moderate nonproliferative diabetic retinopathy with macular edema, bilateral: Secondary | ICD-10-CM

## 2022-07-11 DIAGNOSIS — H04123 Dry eye syndrome of bilateral lacrimal glands: Secondary | ICD-10-CM

## 2022-07-13 ENCOUNTER — Encounter (INDEPENDENT_AMBULATORY_CARE_PROVIDER_SITE_OTHER): Payer: Self-pay | Admitting: Ophthalmology

## 2022-10-30 ENCOUNTER — Ambulatory Visit: Payer: Medicare HMO | Admitting: Podiatry

## 2022-10-30 DIAGNOSIS — M79674 Pain in right toe(s): Secondary | ICD-10-CM | POA: Diagnosis not present

## 2022-10-30 DIAGNOSIS — M79675 Pain in left toe(s): Secondary | ICD-10-CM | POA: Diagnosis not present

## 2022-10-30 DIAGNOSIS — B351 Tinea unguium: Secondary | ICD-10-CM | POA: Diagnosis not present

## 2022-10-30 NOTE — Progress Notes (Signed)
  Subjective:  Patient ID: [name], male    DOB: 07/07/1951,  MRN: [id number]  Chief Complaint  Patient presents with   Nail Problem    Nail trim    71 y.o. male returns for the above complaint.  Patient presents with thickened elongated dystrophic toenails x10.  Patient is a diabetic with last A1c of 5.6.  Appears the patient is well controlled diabetic.  She would like to have her nails debrided down as she is not able to do it herself.  She denies any other acute complaints.  Objective:  There were no vitals filed for this visit. Podiatric Exam: Vascular: dorsalis pedis and posterior tibial pulses are palpable bilateral. Capillary return is immediate. Temperature gradient is WNL. Skin turgor WNL  Sensorium: Normal Semmes Weinstein monofilament test. Normal tactile sensation bilaterally. Nail Exam: Pt has thick disfigured discolored nails with subungual debris noted bilateral entire nail hallux through fifth toenails.  Pain on palpation to the nails. Ulcer Exam: There is no evidence of ulcer or pre-ulcerative changes or infection. Orthopedic Exam: Muscle tone and strength are WNL. No limitations in general ROM. No crepitus or effusions noted. HAV  B/L.  Hammer toes 2-5  B/L. Skin: No Porokeratosis. No infection or ulcers    Assessment & Plan:   1. Pain due to onychomycosis of toenails of both feet       Patient was evaluated and treated and all questions answered.  Onychomycosis with pain  -Nails palliatively debrided as below. -Educated on self-care  Procedure: Nail Debridement Rationale: pain  Type of Debridement: manual, sharp debridement. Instrumentation: Nail nipper, rotary burr. Number of Nails: 10  Procedures and Treatment: Consent by patient was obtained for treatment procedures. The patient understood the discussion of treatment and procedures well. All questions were answered thoroughly reviewed. Debridement of mycotic and hypertrophic toenails, 1 through  5 bilateral and clearing of subungual debris. No ulceration, no infection noted.  Return Visit-Office Procedure: Patient instructed to return to the office for a follow up visit 3 months for continued evaluation and treatment.  Nicholes Rough, DPM    No follow-ups on file.

## 2022-11-20 NOTE — Progress Notes (Signed)
Office Visit    Patient Name: [name] Date of Encounter: 11/24/2022  Primary Care Provider:  Raymon Mutton., FNP Primary Cardiologist:  Dietrich Pates, MD Primary Electrophysiologist: None  Chief Complaint    [name] is a 71 y.o. male with PMH of CVA s/p right MCA infarct 12/2019 found to be cryptogenic ILR placed, DM type II, HTN, chronic combined systolic and diastolic CHF who presents today for evaluation of CHF.  Past Medical History    Past Medical History:  Diagnosis Date   Acute cerebrovascular accident (CVA) (HCC) 01/20/2020   Benign essential HTN 01/20/2020   Cataract    Mixed form OU   Diabetic retinopathy (HCC)    NPDR OU   Hypertensive retinopathy    OU   Seizures (HCC)    per pt- "none since teenage years"   Past Surgical History:  Procedure Laterality Date   LOOP RECORDER INSERTION N/A 01/23/2020   Procedure: LOOP RECORDER INSERTION;  Surgeon: Duke Salvia, MD;  Location: [facility] INVASIVE CV LAB;  Service: Cardiovascular;  Laterality: N/A;    Allergies  Allergies  Allergen Reactions   Shellfish Allergy Anaphylaxis    History of Present Illness    [name]  is a 71 year old male with the above mention past medical history who presents today for follow-up of CHF.  [name] was initially seen by Dr.Klein in 01/2020 following a cryptogenic stroke that required implantable loop recorder that was implanted 01/23/2020.  There were no episodes of paroxysmal AF noted.  He  suffered a subacute large right MCA infarct with residual left hemisphere and mild cognitive impairment. His 2D echo was completed 12/2019 and revealed reduced EF of 45-50% with increased LVH and LV global hypokinesis with normal LA/RA and no evidence of shunt present.  He was seen on 02/2022 by [name] Austin, NP at [facility] neurology for complaint of memory loss.  His blood pressure during visit was 172/66.  There were no other changes noted or medication adjustments.  His  cognition was noted to be stable and without any worsening.  [name] presents today with his daughter for overdue follow-up.  Since last being seen in the office patient reports that he has been feeling well but his daughter does report increased lower extremity swelling and wheezing and shortness of breath after long walk.  He is compliant with his current medication regimen and denies any adverse reactions.  His blood pressure today was initially elevated at 150/62 and was 128/64 on recheck.  He is currently not weighing daily and he is abstaining from added salt to his diet but may be eating an appropriate sodium.  His daughter notes that he has some weeping that occurs with his lower extremity swelling and he does use compression stockings but are not worn consistently.  He is euvolemic otherwise on exam and reports no cough or difficulty laying flat.  His daughter reports that he moved since his previous visit and lost his loop recorder transmitter.  I was able to have a device nurse upload and check his device while in office today.  We will also have a replacement device sent to his new mailing address.  Patient denies chest pain, palpitations, dyspnea, PND, orthopnea, nausea, vomiting, dizziness, syncope, edema, weight gain, or early satiety.    Current Outpatient Medications  Medication Sig Dispense Refill   aspirin 325 MG EC tablet aspirin 325 mg tablet,delayed release     atorvastatin (LIPITOR) 40 MG tablet  Take 1 tablet (40 mg total) by mouth daily at 6 PM. 30 tablet 2   carvedilol (COREG) 6.25 MG tablet Take 6.25 mg by mouth 2 (two) times daily with a meal.     donepezil (ARICEPT) 10 MG tablet TAKE 1 TABLET AT BEDTIME 90 tablet 3   furosemide (LASIX) 40 MG tablet Take 40 mg by mouth daily.     hydrochlorothiazide (HYDRODIURIL) 25 MG tablet Take 25 mg by mouth daily.     latanoprost (XALATAN) 0.005 % ophthalmic solution SMARTSIG:In Eye(s)     losartan (COZAAR) 100 MG tablet Take 100 mg  by mouth daily.     memantine (NAMENDA) 10 MG tablet TAKE 1 TABLET TWICE DAILY 180 tablet 3   metFORMIN (GLUCOPHAGE) 500 MG tablet Take by mouth 2 (two) times daily with a meal.     Multiple Vitamins-Minerals (ONE-A-DAY MENS 50+ PO) Take 1 tablet by mouth daily.     mupirocin ointment (BACTROBAN) 2 % SMARTSIG:1 Application Topical 2-3 Times Daily     Polyvinyl Alcohol-Povidone (REFRESH OP) Place 1 drop into both eyes in the morning and at bedtime.     Potassium Chloride ER 20 MEQ TBCR SMARTSIG:1 pill By Mouth Every Morning     clindamycin (CLEOCIN) 300 MG capsule Take 300 mg by mouth every 6 (six) hours. (Patient not taking: Reported on 11/24/2022)     PFIZER-BIONT COVID-19 VAC-TRIS SUSP injection      PFIZER-BIONTECH COVID-19 VACC 30 MCG/0.3ML injection      Sodium Chloride Flush (NORMAL SALINE FLUSH) 0.9 % SOLN sodium chloride 0.9 % intravenous solution   500 mL by intraven. route.     Current Facility-Administered Medications  Medication Dose Route Frequency Provider Last Rate Last Admin   0.9 %  sodium chloride infusion  500 mL Intravenous Once Armbruster, Willaim Rayas, MD       0.9 %  sodium chloride infusion  500 mL Intravenous Once Armbruster, Willaim Rayas, MD         Review of Systems  Please see the history of present illness.    (+) Lower extremity swelling with weeping (+) Wheezing and shortness of breath with heavy exertion  All other systems reviewed and are otherwise negative except as noted above.  Physical Exam    Wt Readings from Last 3 Encounters:  11/24/22 202 lb 8 oz (91.9 kg)  03/13/22 225 lb (102.1 kg)  12/25/21 211 lb (95.7 kg)   VS: Vitals:   11/24/22 0903 11/24/22 0949  BP: (!) 150/62 128/64  Pulse: (!) 55   SpO2: 98%   ,Body mass index is 31.72 kg/m.  Constitutional:      Appearance: Healthy appearance. Not in distress.  Neck:     Vascular: JVD normal.  Pulmonary:     Effort: Pulmonary effort is normal.     Breath sounds: No wheezing. No rales.  Diminished in the bases Cardiovascular:     Normal rate. Regular rhythm. Normal S1. Normal S2.      Murmurs: There is no murmur.  Edema:    Peripheral edema absent.  Abdominal:     Palpations: Abdomen is soft non tender. There is no hepatomegaly.  Skin:    General: Skin is warm and dry.  Neurological:     General: No focal deficit present.     Mental Status: Alert and oriented to person, place and time.     Cranial Nerves: Cranial nerves are intact.  EKG/LABS/Other Studies Reviewed    ECG personally reviewed by me  today -sinus bradycardia with rate of 55 bpm and first-degree AV block with TWI in lead III and no acute changes compared to previous EKG.  Lab Results  Component Value Date   WBC 6.2 12/25/2021   HGB 12.9 (L) 12/25/2021   HCT 37.8 (L) 12/25/2021   MCV 88.9 12/25/2021   PLT 257 12/25/2021   Lab Results  Component Value Date   CREATININE 1.18 12/25/2021   BUN 19 12/25/2021   NA 139 12/25/2021   K 3.4 (L) 12/25/2021   CL 104 12/25/2021   CO2 27 12/25/2021   Lab Results  Component Value Date   ALT 10 03/15/2020   AST 18 03/15/2020   ALKPHOS 52 03/15/2020   BILITOT 0.4 03/15/2020   Lab Results  Component Value Date   CHOL 113 11/20/2020   HDL 49 11/20/2020   LDLCALC 51 11/20/2020   TRIG 56 11/20/2020   CHOLHDL 2.3 11/20/2020    Lab Results  Component Value Date   HGBA1C 5.6 11/20/2020    Assessment & Plan    1.  Chronic combined systolic and diastolic CHF: -2D echo was completed and revealed reduced EF of 45-50% with increased LVH and LV global hypokinesis with normal LA/RA  -Today patient is euvolemic on exam but does endorse lower extremity swelling with weeping.  He is also experiencing shortness of breath with heavy exertion. -Repeat 2D echo today -Patient will increase Lasix to 40 mg twice daily x 3 days and then 40 mg daily -We will add potassium 20 mEq daily -Continue current GDMT with carvedilol 6.25 mg twice daily, losartan 150 mg daily,   -If patient's EF has decreased we will plan to add Entresto to current GDMT and discontinue losartan. -Patient advised to complete daily weights and abstain from excess salt in his diet. Low sodium diet, fluid restriction <2L, and daily weights encouraged. Educated to contact our office for weight gain of 2 lbs overnight or 5 lbs in one week.   2.  History of CVA: -Patient found to have cryptogenic right MCA stroke in 2021 with implantable loop recorder placed that has not revealed PAF. -Today patient reports no recurrence or residual since previous visit -Patient had lost his device transmitter for loop recorder and had an interrogation in office with normal battery function and histogram. -Will have new transmitter sent to patient's current address. -Continue ASA 325 mg and Lipitor 40 mg  3.  Hyperlipidemia: -Patient's last LDL was 51 2 years ago. -We will plan to have updated lipids at next visit. -Continue atorvastatin as noted above  4.  Essential hypertension: -Patient's blood pressure initially was elevated at 150/62 and was 128/64 on recheck. -We will have him recheck blood pressures twice daily and report back to our office. -Continue carvedilol 6.25 mg twice daily, HCTZ 25 mg, losartan 100 mg    5.  Lower extremity edema: -Patient noted to have bilateral lower extremity edema with weeping and healing sores. -He was advised to elevate when dependent and wear compression stockings when active. -We will have him increase Lasix as noted above.  Disposition: Follow-up with Dorris Carnes, MD or APP in 1 months   Medication Adjustments/Labs and Tests Ordered: Current medicines are reviewed at length with the patient today.  Concerns regarding medicines are outlined above.   Signed, Mable Fill, Marissa Nestle, NP 11/24/2022, 9:54 AM [facility]  Note:  This document was prepared using Dragon voice recognition software and may include unintentional dictation  errors.

## 2022-11-24 ENCOUNTER — Telehealth: Payer: Self-pay

## 2022-11-24 ENCOUNTER — Ambulatory Visit: Payer: Medicare HMO | Attending: Nurse Practitioner | Admitting: Nurse Practitioner

## 2022-11-24 ENCOUNTER — Encounter: Payer: Self-pay | Admitting: Nurse Practitioner

## 2022-11-24 VITALS — BP 128/64 | HR 55 | Ht 67.0 in | Wt 202.5 lb

## 2022-11-24 DIAGNOSIS — I5040 Unspecified combined systolic (congestive) and diastolic (congestive) heart failure: Secondary | ICD-10-CM | POA: Diagnosis not present

## 2022-11-24 DIAGNOSIS — I10 Essential (primary) hypertension: Secondary | ICD-10-CM

## 2022-11-24 DIAGNOSIS — I639 Cerebral infarction, unspecified: Secondary | ICD-10-CM | POA: Diagnosis not present

## 2022-11-24 DIAGNOSIS — R600 Localized edema: Secondary | ICD-10-CM

## 2022-11-24 DIAGNOSIS — E785 Hyperlipidemia, unspecified: Secondary | ICD-10-CM

## 2022-11-24 MED ORDER — POTASSIUM CHLORIDE ER 20 MEQ PO TBCR: 20.0000 meq | EXTENDED_RELEASE_TABLET | Freq: Every day | ORAL | 2 refills | Status: DC

## 2022-11-24 NOTE — Addendum Note (Signed)
Addended by: Kandice Robinsons T on: 11/24/2022 11:42 AM   Modules accepted: Orders

## 2022-11-24 NOTE — Patient Instructions (Addendum)
Medication Instructions:  START Potassium Take 1 tablet daily  TAKE Lasix 40mg  one tablet twice a day for 3 days then go back to one tablet once a day *If you need a refill on your cardiac medications before your next appointment, please call your pharmacy*   Lab Work: TODAY-BMET & BNP If you have labs (blood work) drawn today and your tests are completely normal, you will receive your results only by: MyChart Message (if you have MyChart) OR A paper copy in the mail If you have any lab test that is abnormal or we need to change your treatment, we will call you to review the results.   Testing/Procedures: Your physician has requested that you have an echocardiogram. Echocardiography is a painless test that uses sound waves to create images of your heart. It provides your doctor with information about the size and shape of your heart and how well your heart's chambers and valves are working. This procedure takes approximately one hour. There are no restrictions for this procedure. Please do NOT wear cologne, perfume, aftershave, or lotions (deodorant is allowed). Please arrive 15 minutes prior to your appointment time.    Follow-Up: At [facility], you and your health needs are our priority.  As part of our continuing mission to provide you with exceptional heart care, we have created designated Provider Care Teams.  These Care Teams include your primary Cardiologist (physician) and Advanced Practice Providers (APPs -  Physician Assistants and Nurse Practitioners) who all work together to provide you with the care you need, when you need it.  We recommend signing up for the patient portal called "MyChart".  Sign up information is provided on this After Visit Summary.  MyChart is used to connect with patients for Virtual Visits (Telemedicine).  Patients are able to view lab/test results, encounter notes, upcoming appointments, etc.  Non-urgent messages can be sent to your  provider as well.   To learn more about what you can do with MyChart, go to [facility].    Your next appointment:   1 month(s)  The format for your next appointment:   In Person  Provider:   [email or web address], NP       Other Instructions CHECK YOUR BLOOD PRESSURE TWICE A DAY FOR 2 WEEKS THEN CONTACT THE OFFICE WITH THE READINGS ELEVATE YOUR EXTREMITIES  WHEN AVAILABLE CHECK YOUR LEGS WHEN SITTING  SALTY 6 HANDOUT NUTRITIONAL WORKSHOP HANDOUT GIVEN  Important Information About Sugar

## 2022-11-24 NOTE — Telephone Encounter (Signed)
Patient was seen in general cardiology office today and ILR 2 monitor has been lost per patient. Patient needs a new one shipped to his current  address on file.

## 2022-11-24 NOTE — Telephone Encounter (Signed)
Monitor ordered. He should receive it in 7-10 business days.

## 2022-11-25 ENCOUNTER — Telehealth: Payer: Self-pay | Admitting: Internal Medicine

## 2022-11-25 LAB — BASIC METABOLIC PANEL
BUN/Creatinine Ratio: 24 (ref 10–24)
BUN: 42 mg/dL — ABNORMAL HIGH (ref 8–27)
CO2: 28 mmol/L (ref 20–29)
Calcium: 9.8 mg/dL (ref 8.6–10.2)
Chloride: 101 mmol/L (ref 96–106)
Creatinine, Ser: 1.78 mg/dL — ABNORMAL HIGH (ref 0.76–1.27)
Glucose: 107 mg/dL — ABNORMAL HIGH (ref 70–99)
Potassium: 3.7 mmol/L (ref 3.5–5.2)
Sodium: 143 mmol/L (ref 134–144)
eGFR: 41 mL/min/{1.73_m2} — ABNORMAL LOW (ref >59)

## 2022-11-25 LAB — PRO B NATRIURETIC PEPTIDE: NT-Pro BNP: 70 pg/mL (ref 0–376)

## 2022-11-25 NOTE — Telephone Encounter (Signed)
Daughter returned CMA's call regarding results. 

## 2022-11-25 NOTE — Telephone Encounter (Signed)
Left message for the patient daughter to contact the office before 5pm; that I will be out of the office tomorrow but I will return on Thursday.

## 2022-11-27 NOTE — Telephone Encounter (Signed)
Spoke with the patients daughter, Carney Bern, and made her aware of lab results.  See result note.

## 2022-12-02 ENCOUNTER — Encounter: Payer: Self-pay | Admitting: Adult Health

## 2022-12-16 ENCOUNTER — Other Ambulatory Visit (HOSPITAL_COMMUNITY): Payer: Medicare HMO

## 2022-12-23 ENCOUNTER — Ambulatory Visit (INDEPENDENT_AMBULATORY_CARE_PROVIDER_SITE_OTHER): Payer: Medicare HMO

## 2022-12-23 DIAGNOSIS — I639 Cerebral infarction, unspecified: Secondary | ICD-10-CM | POA: Diagnosis not present

## 2022-12-25 LAB — CUP PACEART REMOTE DEVICE CHECK
Date Time Interrogation Session: 20231229231320
Implantable Pulse Generator Implant Date: 20210201

## 2022-12-30 ENCOUNTER — Ambulatory Visit: Payer: Medicare HMO | Admitting: Nurse Practitioner

## 2023-01-07 NOTE — Progress Notes (Signed)
[facility] Note  01/12/2023     CHIEF COMPLAINT Patient presents for Retina Follow Up   HISTORY OF PRESENT ILLNESS: [name] is a 72 y.o. male who presents to the clinic today for:   HPI     Retina Follow Up   Patient presents with  Diabetic Retinopathy.  In both eyes.  This started 6 months ago.  Duration of 6 months.  I, the attending physician,  performed the HPI with the patient and updated documentation appropriately.        Comments   6 month Retina eval NPDR OU pt is reporting no vision changes noticed he denies flashes or floaters has been having some watering at times in the morning       Last edited by [name], MD on 01/12/2023  8:42 AM.    Pt has no noticed any change in vision   Referring physician: [name], MD [address] [place],  [place] [address]  HISTORICAL INFORMATION:   Selected notes from the MEDICAL RECORD NUMBER Referred by Dr. Wyatt Portela for concern of HTN Ret   CURRENT MEDICATIONS: Current Outpatient Medications (Ophthalmic Drugs)  Medication Sig   latanoprost (XALATAN) 0.005 % ophthalmic solution SMARTSIG:In Eye(s)   Polyvinyl Alcohol-Povidone (REFRESH OP) Place 1 drop into both eyes in the morning and at bedtime.   No current facility-administered medications for this visit. (Ophthalmic Drugs)   Current Outpatient Medications (Other)  Medication Sig   aspirin 325 MG EC tablet aspirin 325 mg tablet,delayed release   atorvastatin (LIPITOR) 40 MG tablet Take 1 tablet (40 mg total) by mouth daily at 6 PM.   carvedilol (COREG) 6.25 MG tablet Take 6.25 mg by mouth 2 (two) times daily with a meal.   clindamycin (CLEOCIN) 300 MG capsule Take 300 mg by mouth every 6 (six) hours. (Patient not taking: Reported on 11/24/2022)   donepezil (ARICEPT) 10 MG tablet TAKE 1 TABLET AT BEDTIME   furosemide (LASIX) 40 MG tablet Take 40 mg by mouth daily.   hydrochlorothiazide (HYDRODIURIL) 25 MG  tablet Take 25 mg by mouth daily.   losartan (COZAAR) 100 MG tablet Take 100 mg by mouth daily.   memantine (NAMENDA) 10 MG tablet TAKE 1 TABLET TWICE DAILY   metFORMIN (GLUCOPHAGE) 500 MG tablet Take by mouth 2 (two) times daily with a meal.   Multiple Vitamins-Minerals (ONE-A-DAY MENS 50+ PO) Take 1 tablet by mouth daily.   mupirocin ointment (BACTROBAN) 2 % SMARTSIG:1 Application Topical 2-3 Times Daily   PFIZER-BIONT COVID-19 VAC-TRIS SUSP injection    PFIZER-BIONTECH COVID-19 VACC 30 MCG/0.3ML injection    Potassium Chloride ER 20 MEQ TBCR Take 20 mEq by mouth daily.   Sodium Chloride Flush (NORMAL SALINE FLUSH) 0.9 % SOLN sodium chloride 0.9 % intravenous solution   500 mL by intraven. route.   Current Facility-Administered Medications (Other)  Medication Route   0.9 %  sodium chloride infusion Intravenous   0.9 %  sodium chloride infusion Intravenous   REVIEW OF SYSTEMS: ROS   Positive for: Endocrine, Eyes Negative for: Constitutional, Gastrointestinal, Neurological, Skin, Genitourinary, Musculoskeletal, HENT, Cardiovascular, Respiratory, Psychiatric, Allergic/Imm, Heme/Lymph Last edited by Parthenia Ames, COT on 01/12/2023  7:52 AM.     ALLERGIES Allergies  Allergen Reactions   Shellfish Allergy Anaphylaxis   PAST MEDICAL HISTORY Past Medical History:  Diagnosis Date   Acute cerebrovascular accident (CVA) ([place]) 01/20/2020   Benign essential HTN 01/20/2020   Cataract  Mixed form OU   Diabetic retinopathy ([place])    NPDR OU   Hypertensive retinopathy    OU   Seizures ([place])    per pt- "none since teenage years"   Past Surgical History:  Procedure Laterality Date   LOOP RECORDER INSERTION N/A 01/23/2020   Procedure: LOOP RECORDER INSERTION;  Surgeon: Deboraha Sprang, MD;  Location: [facility];  Service: Cardiovascular;  Laterality: N/A;   FAMILY HISTORY Family History  Problem Relation Age of Onset   COPD Mother    Diabetes Mother    Dementia Father     Diabetes Father    Cancer Father        prostate and lung   Colon cancer Neg Hx    Esophageal cancer Neg Hx    Stomach cancer Neg Hx    Rectal cancer Neg Hx    SOCIAL HISTORY Social History   Tobacco Use   Smoking status: Never   Smokeless tobacco: Never  Vaping Use   Vaping Use: Never used  Substance Use Topics   Alcohol use: Never   Drug use: Never       OPHTHALMIC EXAM: Base Eye Exam     Visual Acuity (Snellen - Linear)       Right Left   Dist [place] 20/80 +1 20/60 -2   Dist ph [place] 20/70 -2 20/40         Tonometry (Tonopen, 7:59 AM)       Right Left   Pressure 12 15         Pupils       Pupils Dark Light Shape React APD   Right PERRL 3 2 Round Brisk None   Left PERRL 3 2 Round Brisk None         Visual Fields       Left Right    Full Full         Extraocular Movement       Right Left    Full, Ortho Full, Ortho         Neuro/Psych     Oriented x3: Yes   Mood/Affect: Normal         Dilation     Both eyes:            Slit Lamp and Fundus Exam     Slit Lamp Exam       Right Left   Lids/Lashes Dermatochalasis - upper lid, mild MGD Dermatochalasis - upper lid   Conjunctiva/Sclera Nasal and temporal Pinguecula, , Melanosis Nasal and temporal Pinguecula, mild Melanosis   Cornea Arcus, 2-3+Punctate epithelial erosions Arcus, 2+ Punctate epithelial erosions with irregular epi inferiorly, mild debris in tear film   Anterior Chamber Deep and quiet Deep and quiet   Iris Round and dilated, No NVI Round and dilated, No NVI   Lens 3+ Nuclear sclerosis, 3+ Cortical cataract 3+ Nuclear sclerosis, 3+ Cortical cataract   Anterior Vitreous Vitreous syneresis Vitreous syneresis, Posterior vitreous detachment         Fundus Exam       Right Left   Disc sharp rim, mild pallor, +cupping Sharp, mild Pallor   C/D Ratio 0.6 0.5   Macula Flat, good foveal reflex, rare MA, scattered, cystic changes -- improved, No frank edema Flat, good  foveal reflex, scattered Microaneurysms/DBH, trace cystic changes IT macula -- slightly increased   Vessels attenuated, mild tortuosity attenuated, Tortuous   Periphery Attached, operculated hole at 1000 with partial pigment and +cuff  of SRF -- good laser changes surrounding, scattered MA/DBH, White without pressure temporal periphery Attached, scattered DBH, White without pressure temporally, focal pigmented CR scar at 0130 equator, good peripheral 360 PRP           IMAGING AND PROCEDURES  Imaging and Procedures for _0 @  OCT, Retina - OU - Both Eyes       Right Eye Quality was good. Central Foveal Thickness: 233. Progression has been stable. Findings include normal foveal contour, no IRF, no SRF (mild diffuse retinal thinning -- stable; tr noncentral cystic changes).   Left Eye Quality was good. Central Foveal Thickness: 255. Progression has worsened. Findings include normal foveal contour, no SRF, intraretinal hyper-reflective material, intraretinal fluid (Interval increase in cystic changes IT fovea and mac ).   Notes *Images captured and stored on drive  Diagnosis / Impression:  OD: NFP, no SRF -- mild diffuse retinal thinning -- stable; tr noncentral cystic changes OS: NFP, no SRF, Interval increase in cystic changes IT fovea and mac    Clinical management:  See below  Abbreviations: NFP - Normal foveal profile. CME - cystoid macular edema. PED - pigment epithelial detachment. IRF - intraretinal fluid. SRF - subretinal fluid. EZ - ellipsoid zone. ERM - epiretinal membrane. ORA - outer retinal atrophy. ORT - outer retinal tubulation. SRHM - subretinal hyper-reflective material      Fluorescein Angiography Optos (Transit OS)       Right Eye Progression has improved. Early phase findings include microaneurysm, vascular perfusion defect. Mid/Late phase findings include leakage, microaneurysm, vascular perfusion defect (Scattered patches of vascular non-perfusion; large  area of vascular non perfusion temporally, no NV).   Left Eye Progression has improved. Early phase findings include delayed filling, staining, microaneurysm, vascular perfusion defect. Mid/Late phase findings include leakage, staining, microaneurysm, vascular perfusion defect (Scattered patches of vascular non perfusion peripherally, staining of 360 laser, scattered leaking MA -- most prominent IT to fovea, no NV).   Notes **Images stored on drive**  Impression: OD: Scattered patches of vascular non-perfusion; large area of vascular non perfusion temporally, no NV OS: delayed filling; scattered patches of vascular non perfusion peripherally, staining of 360 laser, scattered leaking MA -- most prominent IT to fovea, no NV      Intravitreal Injection, Pharmacologic Agent - OS - Left Eye       Time Out 01/12/2023. 9:29 AM. Confirmed correct patient, procedure, site, and patient consented.   Anesthesia Topical anesthesia was used. Anesthetic medications included Lidocaine 2%, Proparacaine 0.5%.   Procedure Preparation included 5% betadine to ocular surface, eyelid speculum. A (32g) needle was used.   Injection: 1.25 mg Bevacizumab 1.48m/0.05ml   Route: Intravitreal, Site: Left Eye   NDC:: 18299-371-69 Lot:: [id number] Expiration date: 02/20/2023   Post-op Post injection exam found visual acuity of at least counting fingers. The patient tolerated the procedure well. There were no complications. The patient received written and verbal post procedure care education.            ASSESSMENT/PLAN:    ICD-10-CM   1. Moderate nonproliferative diabetic retinopathy of both eyes with macular edema associated with type 2 diabetes mellitus (HCC)  EO17.5102OCT, Retina - OU - Both Eyes    Fluorescein Angiography Optos (Transit OS)    Intravitreal Injection, Pharmacologic Agent - OS - Left Eye    Bevacizumab (AVASTIN) SOLN 1.25 mg    2. Retinal hole of right eye  H33.321     3. Essential  hypertension  I10  4. Hypertensive retinopathy of both eyes  H35.033 Fluorescein Angiography Optos (Transit OS)    5. Combined forms of age-related cataract of both eyes  H25.813     6. Dry eyes  H04.123      1. Moderate non-proliferative diabetic retinopathy, OU  - s/p IVA OS #1 (05.07.21), #2 (06.04.21), #3 (07.06.21), #4 (08.03.21), #5 (08.31.21), #6 (9.28.21), #7 (11.2.21), #8 (12.07.21), #9 (02.02.22), #10 (03.30.22), #11 (05.25.22), #12 (08.03.22), #13 (10.20.22), #14 (01.12.23)  - s/p PRP OS (04.09.21) -- good laser changes  - repeat FA (09.28.21) shows late leaking MA OU, significant capillary drop-out   - repeat FA (01.23.24) shows OD: Scattered patches of vascular non-perfusion; large area of vascular non perfusion temporally, no NV; OS: delayed filling; scattered patches of vascular non perfusion peripherally, staining of 360 laser, scattered leaking MA -- most prominent IT to fovea, no NV  - exam shows scattered MA/IRH/CWS OU -- improving   - BCVA OD 20/50, OS 20/30 -- both decreased (mostly cataract)  - OCT shows OD: mild diffuse retinal thinning -- stable; tr noncentral cystic changes; OS: Interval increase in cystic changes IT fovea and mac   - recommend IVA OS #15 today, 01.22.24 for increased DME  - pt in agreement  - RBA of procedure discussed, questions answered - IVA informed consent obtained and signed OS (01.22.24) - see procedure note  - f/u February 5 -- DFE/OCT fill in PRP OD  2. Operculated retinal hole w/ cuff of SRF / focal RD, right eye  - operculated hole located at 1000 with partial pigment and +cuff of SRF / focal RD  - s/p retinopexy OD (03.17.21) -- good laser changes surrounding  - monitor  3,4. Hypertensive retinopathy OU  - discussed importance of tight BP control  - monitor  5. Mixed form age related cataract OU  - The symptoms of cataract, surgical options, and treatments and risks were discussed with patient.  - discussed diagnosis and  progression  - under the expert management of Dr. Zenia Resides - clear from a retina standpoint to proceed with cataract surgery when pt and surgeon are ready   6. Dry eyes OU  - recommend artificial tears and lubricating ointment as needed  Ophthalmic Meds Ordered this visit:  Meds ordered this encounter  Medications   Bevacizumab (AVASTIN) SOLN 1.25 mg     Return in about 2 weeks (around 01/26/2023) for NPDR OU - Dilated Exam, OCT, Laser PRP OD.  There are no Patient Instructions on file for this visit.  Explained the diagnoses, plan, and follow up with the patient and they expressed understanding.  Patient expressed understanding of the importance of proper follow up care.   This document serves as a record of services personally performed by Gardiner Sleeper, MD, PhD. It was created on their behalf by Roselee Nova, COMT. The creation of this record is the provider's dictation and/or activities during the visit.  Electronically signed by: Roselee Nova, COMT 01/13/23 10:05 PM  This document serves as a record of services personally performed by Gardiner Sleeper, MD, PhD. It was created on their behalf by San Jetty. Owens Shark, OA an ophthalmic technician. The creation of this record is the provider's dictation and/or activities during the visit.    Electronically signed by: San Jetty. Owens Shark, [place] 01.22.2024 10:05 PM  Gardiner Sleeper, M.D., Ph.D. Diseases & Surgery of the Retina and Vitreous [facility]  I have reviewed the above documentation for accuracy and  completeness, and I agree with the above. Gardiner Sleeper, M.D., Ph.D. 01/13/23 10:08 PM  Abbreviations: M myopia (nearsighted); A astigmatism; H hyperopia (farsighted); P presbyopia; Mrx spectacle prescription;  CTL contact lenses; OD right eye; OS left eye; OU both eyes  XT exotropia; ET esotropia; PEK punctate epithelial keratitis; PEE punctate epithelial erosions; DES dry eye syndrome; MGD meibomian gland  dysfunction; ATs artificial tears; PFAT's preservative free artificial tears; [place] nuclear sclerotic cataract; PSC posterior subcapsular cataract; ERM epi-retinal membrane; PVD posterior vitreous detachment; RD retinal detachment; DM diabetes mellitus; DR diabetic retinopathy; NPDR non-proliferative diabetic retinopathy; PDR proliferative diabetic retinopathy; CSME clinically significant macular edema; DME diabetic macular edema; dbh dot blot hemorrhages; CWS cotton wool spot; POAG primary open angle glaucoma; C/D cup-to-disc ratio; HVF humphrey visual field; GVF goldmann visual field; OCT optical coherence tomography; IOP intraocular pressure; BRVO Branch retinal vein occlusion; CRVO central retinal vein occlusion; CRAO central retinal artery occlusion; BRAO branch retinal artery occlusion; RT retinal tear; SB scleral buckle; PPV pars plana vitrectomy; VH Vitreous hemorrhage; PRP panretinal laser photocoagulation; IVK intravitreal kenalog; VMT vitreomacular traction; MH Macular hole;  NVD neovascularization of the disc; NVE neovascularization elsewhere; AREDS age related eye disease study; ARMD age related macular degeneration; POAG primary open angle glaucoma; EBMD epithelial/anterior basement membrane dystrophy; ACIOL anterior chamber intraocular lens; IOL intraocular lens; PCIOL posterior chamber intraocular lens; Phaco/IOL phacoemulsification with intraocular lens placement; [place] photorefractive keratectomy; LASIK laser assisted in situ keratomileusis; HTN hypertension; DM diabetes mellitus; COPD chronic obstructive pulmonary disease

## 2023-01-12 ENCOUNTER — Ambulatory Visit (INDEPENDENT_AMBULATORY_CARE_PROVIDER_SITE_OTHER): Payer: Medicare HMO | Admitting: Ophthalmology

## 2023-01-12 ENCOUNTER — Encounter (INDEPENDENT_AMBULATORY_CARE_PROVIDER_SITE_OTHER): Payer: Self-pay | Admitting: Ophthalmology

## 2023-01-12 DIAGNOSIS — E113313 Type 2 diabetes mellitus with moderate nonproliferative diabetic retinopathy with macular edema, bilateral: Secondary | ICD-10-CM | POA: Diagnosis not present

## 2023-01-12 DIAGNOSIS — H35033 Hypertensive retinopathy, bilateral: Secondary | ICD-10-CM | POA: Diagnosis not present

## 2023-01-12 DIAGNOSIS — H33321 Round hole, right eye: Secondary | ICD-10-CM

## 2023-01-12 DIAGNOSIS — I10 Essential (primary) hypertension: Secondary | ICD-10-CM | POA: Diagnosis not present

## 2023-01-12 DIAGNOSIS — H25813 Combined forms of age-related cataract, bilateral: Secondary | ICD-10-CM

## 2023-01-12 DIAGNOSIS — H04123 Dry eye syndrome of bilateral lacrimal glands: Secondary | ICD-10-CM

## 2023-01-12 MED ORDER — BEVACIZUMAB CHEMO INJECTION 1.25MG/0.05ML SYRINGE FOR KALEIDOSCOPE
1.2500 mg | INTRAVITREAL | Status: AC | PRN
  Administered 2023-01-12: 1.25 mg via INTRAVITREAL

## 2023-01-13 ENCOUNTER — Ambulatory Visit (HOSPITAL_COMMUNITY): Payer: Medicare HMO | Attending: Internal Medicine

## 2023-01-13 DIAGNOSIS — E785 Hyperlipidemia, unspecified: Secondary | ICD-10-CM | POA: Diagnosis present

## 2023-01-13 DIAGNOSIS — R600 Localized edema: Secondary | ICD-10-CM | POA: Diagnosis present

## 2023-01-13 DIAGNOSIS — I639 Cerebral infarction, unspecified: Secondary | ICD-10-CM | POA: Diagnosis present

## 2023-01-13 DIAGNOSIS — I5040 Unspecified combined systolic (congestive) and diastolic (congestive) heart failure: Secondary | ICD-10-CM | POA: Diagnosis not present

## 2023-01-13 DIAGNOSIS — I10 Essential (primary) hypertension: Secondary | ICD-10-CM | POA: Insufficient documentation

## 2023-01-13 LAB — ECHOCARDIOGRAM COMPLETE
Area-P 1/2: 3.31 cm2
P 1/2 time: 514 msec
S' Lateral: 2.4 cm

## 2023-01-17 ENCOUNTER — Encounter: Payer: Self-pay | Admitting: Adult Health

## 2023-01-19 NOTE — Progress Notes (Unsigned)
Office Visit    Patient Name: [name] Date of Encounter: 01/20/2023  Primary Care Provider:  Raymon Mutton., FNP Primary Cardiologist:  Dietrich Pates, MD Primary Electrophysiologist: None  Chief Complaint    [name] is a 72 y.o. male with PMH of CVA s/p right MCA infarct 12/2019 found to be cryptogenic ILR placed, DM type II, HTN, chronic combined systolic and diastolic CHF who presents [name] for 1 month follow-up of CHF.  Past Medical History    Past Medical History:  Diagnosis Date   Acute cerebrovascular accident (CVA) ([facility]) 01/20/2020   Benign essential HTN 01/20/2020   Cataract    Mixed form OU   Diabetic retinopathy ([facility])    NPDR OU   Hypertensive retinopathy    OU   Seizures ([facility])    per pt- "none since teenage years"   Past Surgical History:  Procedure Laterality Date   LOOP RECORDER INSERTION N/A 01/23/2020   Procedure: LOOP RECORDER INSERTION;  Surgeon: Duke Salvia, MD;  [facility]: [facility];  Service: Cardiovascular;  Laterality: N/A;    Allergies  Allergies  Allergen Reactions   Shellfish Allergy Anaphylaxis    History of Present Illness    [name]  is a 72 year old male with the above mention past medical history who presents [name] for follow-up of CHF.  [name] was initially seen by Dr.Klein in 01/2020 following a cryptogenic stroke that required implantable loop recorder that was implanted 01/23/2020.  He has some residual left hemisphere and mild cognitive impairment from his subacute large right MCA stroke.  2D echo was completed 12/2019 showing reduced EF of 45-50% and increased LVH and LV global hypokinesis.  He was seen in follow-up on 11/24/2022 with shortness of breath and increased lower extremity swelling.  The patient's Lasix was increased to 40 mg twice daily x 3 days and 2D echo was completed that revealed recovered EF of 60 to 65% with trivial MV regurgitation and mild to moderate AV regurgitation with mild  dilation of ascending aorta measuring 43 mm.  There were no additional medication changes made at his visit.  [name] for 1 month follow-up with his daughter.  Since last being seen in the office patient reports that he is doing well with no new cardiac complaints.  He is euvolemic on exam [name] and has had good response to his increased Lasix dose.  His blood pressure [name] is well-controlled at 138/68.  He is compliant with his current medication regimen and denies any adverse reactions.  He also reports resolution of shortness of breath since previous visit.  During our visit we reviewed the results of his most recent 2D echo which shows recovery of his EF since previous testing.  We also discussed his mild aortic dilation at 43 mm and reviewed anatomy.  Patient was not fasting and we will patient denies chest pain, palpitations, dyspnea, PND, orthopnea, nausea, vomiting, dizziness, syncope, edema, weight gain, or early satiety.   Home Medications    Current Outpatient Medications  Medication Sig Dispense Refill   aspirin 325 MG EC tablet aspirin 325 mg tablet,delayed release     atorvastatin (LIPITOR) 40 MG tablet Take 1 tablet (40 mg total) by mouth daily at 6 PM. 30 tablet 2   carvedilol (COREG) 6.25 MG tablet Take 6.25 mg by mouth 2 (two) times daily with a meal.     donepezil (ARICEPT) 10 MG tablet TAKE 1 TABLET AT BEDTIME  90 tablet 3   furosemide (LASIX) 40 MG tablet Take 40 mg by mouth daily.     hydrochlorothiazide (HYDRODIURIL) 25 MG tablet Take 25 mg by mouth daily.     latanoprost (XALATAN) 0.005 % ophthalmic solution SMARTSIG:In Eye(s)     losartan (COZAAR) 100 MG tablet Take 100 mg by mouth daily.     memantine (NAMENDA) 10 MG tablet TAKE 1 TABLET TWICE DAILY 180 tablet 3   metFORMIN (GLUCOPHAGE) 500 MG tablet Take by mouth 2 (two) times daily with a meal.     Multiple Vitamins-Minerals (ONE-A-DAY MENS 50+ PO) Take 1 tablet by mouth daily.     mupirocin ointment  (BACTROBAN) 2 % as needed.     PFIZER-BIONT COVID-19 VAC-TRIS SUSP injection      PFIZER-BIONTECH COVID-19 VACC 30 MCG/0.3ML injection      Polyvinyl Alcohol-Povidone (REFRESH OP) Place 1 drop into both eyes in the morning and at bedtime.     Potassium Chloride ER 20 MEQ TBCR Take 20 mEq by mouth daily. 30 tablet 2   Current Facility-Administered Medications  Medication Dose Route Frequency Provider Last Rate Last Admin   0.9 %  sodium chloride infusion  500 mL Intravenous Once Armbruster, Carlota Raspberry, MD       0.9 %  sodium chloride infusion  500 mL Intravenous Once Armbruster, Carlota Raspberry, MD         Review of Systems  Please see the history of present illness.    (+) Mild lower extremity edema (+) Minimal shortness of breath with activity  All other systems reviewed and are otherwise negative except as noted above.  Physical Exam    Wt Readings from Last 3 Encounters:  01/20/23 201 lb 6.4 oz (91.4 kg)  11/24/22 202 lb 8 oz (91.9 kg)  03/13/22 225 lb (102.1 kg)   VS: Vitals:   01/20/23 0812  BP: (!) 142/70  Pulse: 64  SpO2: 96%  ,Body mass index is 31.54 kg/m.  Constitutional:      Appearance: Healthy appearance. Not in distress.  Neck:     Vascular: JVD normal.  Pulmonary:     Effort: Pulmonary effort is normal.     Breath sounds: No wheezing. No rales. Diminished in the bases Cardiovascular:     Normal rate. Regular rhythm. Normal S1. Normal S2.      Murmurs: There is no murmur.  Edema:    Peripheral edema absent.  Abdominal:     Palpations: Abdomen is soft non tender. There is no hepatomegaly.  Skin:    General: Skin is warm and dry.  Neurological:     General: No focal deficit present.     Mental Status: Alert and oriented to person, place and time.     Cranial Nerves: Cranial nerves are intact.  EKG/LABS/Other Studies Reviewed    ECG personally reviewed by me [name] -none completed [name]  Lab Results  Component Value Date   WBC 6.2 12/25/2021   HGB 12.9  (L) 12/25/2021   HCT 37.8 (L) 12/25/2021   MCV 88.9 12/25/2021   PLT 257 12/25/2021   Lab Results  Component Value Date   CREATININE 1.78 (H) 11/24/2022   BUN 42 (H) 11/24/2022   NA 143 11/24/2022   K 3.7 11/24/2022   CL 101 11/24/2022   CO2 28 11/24/2022   Lab Results  Component Value Date   ALT 10 03/15/2020   AST 18 03/15/2020   ALKPHOS 52 03/15/2020   BILITOT 0.4 03/15/2020  Lab Results  Component Value Date   CHOL 113 11/20/2020   HDL 49 11/20/2020   LDLCALC 51 11/20/2020   TRIG 56 11/20/2020   CHOLHDL 2.3 11/20/2020    Lab Results  Component Value Date   HGBA1C 5.6 11/20/2020    Assessment & Plan    1.  Chronic combined systolic and diastolic CHF: -Updated 2D echo was completed on 01/13/2023 showing improved EF of 60 to 65% with trivial MV regurgitation and mild to moderate AV regurgitation with mild dilation of ascending aorta measuring 43 mm.  -He is euvolemic on exam [name] and reports improvement to shortness of breath with exertion. -Continue current GDMT with carvedilol 6.25 mg twice daily, losartan 100 mg daily, and Lasix 40 mg daily -Low sodium diet, fluid restriction <2L, and daily weights encouraged. Educated to contact our office for weight gain of 2 lbs overnight or 5 lbs in one week.   2.  History of CVA: -Patient found to have cryptogenic right MCA stroke in 2021 with implantable loop recorder placed that has not revealed PAF. -Normal Paceart report -[name] patient reports no residual effects   3.  Hyperlipidemia: -Patient's last LDL was 51 2 years ago. -We will recheck lipids and LFTs once patient is fasting. -Continue Lipitor 40 mg daily   4.  Essential hypertension: -Patient's blood pressure [name] was well-controlled at 138/68 -Continue losartan 100 mg daily and carvedilol 6.25 mg twice daily  5.  Lower extremity edema: -Patient is euvolemic [name] with chronic lower extremity swelling that has improved since previous visit. -Patient's  shortness of breath has also improved and he was advised to continue Lasix 40 mg daily and potassium 20 mEq daily  6.  Mild aortic root dilation: -Patient's most recent 2D echo showed aortic root dilation of 43 mmHg -Results reviewed with patient and advised to maintain good BP control we -We will repeat surveillance MRI to evaluate for possible progression.  Disposition: Follow-up with Dorris Carnes, MD or APP in 6 months    Medication Adjustments/Labs and Tests Ordered: Current medicines are reviewed at length with the patient [name].  Concerns regarding medicines are outlined above.   Signed, Mable Fill, Marissa Nestle, NP 01/20/2023, 8:27 AM [facility]  Note:  This document was prepared using Dragon voice recognition software and may include unintentional dictation errors.

## 2023-01-20 ENCOUNTER — Ambulatory Visit: Payer: Medicare HMO | Attending: Nurse Practitioner | Admitting: Nurse Practitioner

## 2023-01-20 ENCOUNTER — Encounter: Payer: Self-pay | Admitting: Nurse Practitioner

## 2023-01-20 VITALS — BP 138/68 | HR 64 | Ht 67.0 in | Wt 201.4 lb

## 2023-01-20 DIAGNOSIS — I5040 Unspecified combined systolic (congestive) and diastolic (congestive) heart failure: Secondary | ICD-10-CM | POA: Diagnosis not present

## 2023-01-20 DIAGNOSIS — E785 Hyperlipidemia, unspecified: Secondary | ICD-10-CM

## 2023-01-20 DIAGNOSIS — R600 Localized edema: Secondary | ICD-10-CM

## 2023-01-20 DIAGNOSIS — I77810 Thoracic aortic ectasia: Secondary | ICD-10-CM

## 2023-01-20 DIAGNOSIS — I10 Essential (primary) hypertension: Secondary | ICD-10-CM | POA: Diagnosis not present

## 2023-01-20 NOTE — Addendum Note (Signed)
Addended by: Whitman Hero on: 01/20/2023 11:34 AM   Modules accepted: Orders

## 2023-01-20 NOTE — Telephone Encounter (Signed)
Do you have any suggestion on how to help with patient behavior ?

## 2023-01-20 NOTE — Patient Instructions (Addendum)
Medication Instructions:  Your physician recommends that you continue on your current medications as directed. Please refer to the Current Medication list given to you today. *If you need a refill on your cardiac medications before your next appointment, please call your pharmacy*   Lab Work: FASTING LFT & LIPIDS    Testing/Procedures: CHEST CT    Follow-Up: At [facility], you and your health needs are our priority.  As part of our continuing mission to provide you with exceptional heart care, we have created designated Provider Care Teams.  These Care Teams include your primary Cardiologist (physician) and Advanced Practice Providers (APPs -  Physician Assistants and Nurse Practitioners) who all work together to provide you with the care you need, when you need it.  We recommend signing up for the patient portal called "MyChart".  Sign up information is provided on this After Visit Summary.  MyChart is used to connect with patients for Virtual Visits (Telemedicine).  Patients are able to view lab/test results, encounter notes, upcoming appointments, etc.  Non-urgent messages can be sent to your provider as well.   To learn more about what you can do with MyChart, go to [email or web address].    Your next appointment:   6 month(s)  Provider:   Dorris Carnes, MD     Other Instructions

## 2023-01-22 LAB — CUP PACEART REMOTE DEVICE CHECK
Date Time Interrogation Session: 20240131231616
Implantable Pulse Generator Implant Date: 20210201

## 2023-01-22 NOTE — Progress Notes (Signed)
[facility] Note  01/26/2023     CHIEF COMPLAINT Patient presents for Retina Follow Up   HISTORY OF PRESENT ILLNESS: [name] is a 72 y.o. male who presents to the clinic today for:   HPI     Retina Follow Up   Patient presents with  Diabetic Retinopathy.  In both eyes.  This started months ago.  Duration of 2 weeks.  I, the attending physician,  performed the HPI with the patient and updated documentation appropriately.        Comments   Patient feels that the vision is the same. He is complaining that the eyes are itching. He is using AT's OU PRN and Latanoprost OU QHS. He did not check his blood sugar.       Last edited by Annie Paras, COT on 01/26/2023  9:42 AM.     Pt is here for PRP OD fill in   Referring physician: Sonia [address] [place],  [place] [address]  HISTORICAL INFORMATION:   Selected notes from the MEDICAL RECORD NUMBER Referred by Dr. Wyatt Portela for concern of HTN Ret   CURRENT MEDICATIONS: Current Outpatient Medications (Ophthalmic Drugs)  Medication Sig   prednisoLONE acetate (PRED FORTE) 1 % ophthalmic suspension Place 1 drop into the right eye 4 (four) times daily for 7 days.   latanoprost (XALATAN) 0.005 % ophthalmic solution SMARTSIG:In Eye(s)   Polyvinyl Alcohol-Povidone (REFRESH OP) Place 1 drop into both eyes in the morning and at bedtime.   No current facility-administered medications for this visit. (Ophthalmic Drugs)   Current Outpatient Medications (Other)  Medication Sig   aspirin 325 MG EC tablet aspirin 325 mg tablet,delayed release   atorvastatin (LIPITOR) 40 MG tablet Take 1 tablet (40 mg total) by mouth daily at 6 PM.   carvedilol (COREG) 6.25 MG tablet Take 6.25 mg by mouth 2 (two) times daily with a meal.   donepezil (ARICEPT) 10 MG tablet TAKE 1 TABLET AT BEDTIME   furosemide (LASIX) 40 MG tablet Take 40 mg by mouth daily.   hydrochlorothiazide (HYDRODIURIL)  25 MG tablet Take 25 mg by mouth daily.   losartan (COZAAR) 100 MG tablet Take 100 mg by mouth daily.   memantine (NAMENDA) 10 MG tablet TAKE 1 TABLET TWICE DAILY   metFORMIN (GLUCOPHAGE) 500 MG tablet Take by mouth 2 (two) times daily with a meal.   Multiple Vitamins-Minerals (ONE-A-DAY MENS 50+ PO) Take 1 tablet by mouth daily.   mupirocin ointment (BACTROBAN) 2 % as needed.   PFIZER-BIONT COVID-19 VAC-TRIS SUSP injection    PFIZER-BIONTECH COVID-19 VACC 30 MCG/0.3ML injection    Potassium Chloride ER 20 MEQ TBCR Take 20 mEq by mouth daily.   Current Facility-Administered Medications (Other)  Medication Route   0.9 %  sodium chloride infusion Intravenous   0.9 %  sodium chloride infusion Intravenous   REVIEW OF SYSTEMS: ROS   Positive for: Eyes Last edited by [name], MD on 01/26/2023 12:34 PM.      ALLERGIES Allergies  Allergen Reactions   Shellfish Allergy Anaphylaxis   PAST MEDICAL HISTORY Past Medical History:  Diagnosis Date   Acute cerebrovascular accident (CVA) ([place]) 01/20/2020   Benign essential HTN 01/20/2020   Cataract    Mixed form OU   Diabetic retinopathy ([place])    NPDR OU   Hypertensive retinopathy    OU   Seizures ([place])    per pt- "none since teenage years"  Past Surgical History:  Procedure Laterality Date   LOOP RECORDER INSERTION N/A 01/23/2020   Procedure: LOOP RECORDER INSERTION;  Surgeon: Deboraha Sprang, MD;  Location: [facility];  Service: Cardiovascular;  Laterality: N/A;   FAMILY HISTORY Family History  Problem Relation Age of Onset   COPD Mother    Diabetes Mother    Dementia Father    Diabetes Father    Cancer Father        prostate and lung   Colon cancer Neg Hx    Esophageal cancer Neg Hx    Stomach cancer Neg Hx    Rectal cancer Neg Hx    SOCIAL HISTORY Social History   Tobacco Use   Smoking status: Never   Smokeless tobacco: Never  Vaping Use   Vaping Use: Never used  Substance Use Topics   Alcohol use:  Never   Drug use: Never       OPHTHALMIC EXAM: Base Eye Exam     Visual Acuity (Snellen - Linear)       Right Left   Dist [place] 20/80 20/60   Dist ph [place] 20/70 20/40         Tonometry (Tonopen, 9:45 AM)       Right Left   Pressure 17 14         Pupils       Dark Light Shape React APD   Right 3 2 Round Brisk None   Left 3 2 Round Brisk None         Visual Fields       Left Right    Full Full         Extraocular Movement       Right Left    Full, Ortho Full, Ortho         Neuro/Psych     Oriented x3: Yes   Mood/Affect: Normal         Dilation     Both eyes: 2.5% Phenylephrine, 1.0% Mydriacyl @ 9:42 AM           Slit Lamp and Fundus Exam     Slit Lamp Exam       Right Left   Lids/Lashes Dermatochalasis - upper lid, mild MGD Dermatochalasis - upper lid   Conjunctiva/Sclera Nasal and temporal Pinguecula, , Melanosis Nasal and temporal Pinguecula, mild Melanosis   Cornea Arcus, 2-3+Punctate epithelial erosions Arcus, 2+ Punctate epithelial erosions with irregular epi inferiorly, mild debris in tear film   Anterior Chamber Deep and quiet Deep and quiet   Iris Round and dilated, No NVI Round and dilated, No NVI   Lens 3+ Nuclear sclerosis, 3+ Cortical cataract 3+ Nuclear sclerosis, 3+ Cortical cataract   Anterior Vitreous Vitreous syneresis Vitreous syneresis, Posterior vitreous detachment         Fundus Exam       Right Left   Disc sharp rim, mild pallor, +cupping Sharp, mild Pallor   C/D Ratio 0.6 0.5   Macula Flat, good foveal reflex, rare MA, scattered, cystic changes -- improved, No frank edema Flat, good foveal reflex, scattered Microaneurysms/DBH, trace cystic changes IT macula -- slightly increased   Vessels attenuated, mild tortuosity attenuated, Tortuous   Periphery Attached, operculated hole at 1000 with partial pigment and +cuff of SRF -- good laser changes surrounding, scattered MA/DBH, White without pressure temporal  periphery Attached, scattered DBH, White without pressure temporally, focal pigmented CR scar at 0130 equator, good peripheral 360 PRP  IMAGING AND PROCEDURES  Imaging and Procedures for @TODAY @  OCT, Retina - OU - Both Eyes       Right Eye Quality was good. Central Foveal Thickness: 228. Progression has been stable. Findings include normal foveal contour, no IRF, no SRF (mild diffuse retinal thinning -- stable).   Left Eye Quality was good. Central Foveal Thickness: 265. Progression has worsened. Findings include normal foveal contour, no SRF, intraretinal hyper-reflective material, intraretinal fluid (Interval increase in cystic changes IT fovea and mac ).   Notes *Images captured and stored on drive  Diagnosis / Impression:  OD: NFP, no IRF / SRF -- mild diffuse retinal thinning -- stable OS: NFP, no SRF, Interval increase in cystic changes IT fovea and mac    Clinical management:  See below  Abbreviations: NFP - Normal foveal profile. CME - cystoid macular edema. PED - pigment epithelial detachment. IRF - intraretinal fluid. SRF - subretinal fluid. EZ - ellipsoid zone. ERM - epiretinal membrane. ORA - outer retinal atrophy. ORT - outer retinal tubulation. SRHM - subretinal hyper-reflective material      Panretinal Photocoagulation - OD - Right Eye       LASER PROCEDURE NOTE  Diagnosis:   Moderate NPDR w/ vascular nonperfusion, RIGHT EYE  Procedure:  Pan-retinal photocoagulation using slit lamp laser, RIGHT EYE  Anesthesia:  Topical  Surgeon: [name], MD, PhD   Informed consent obtained, operative eye marked, and time out performed prior to initiation of laser.   Lumenis [id number] slit lamp laser Pattern: 3x3 square Power: 320-340 mW Duration: 30 msec  Spot size: 200 microns  # spots: [phone] spots   Complications: None  RTC: Feb 29 or later  Patient tolerated the procedure well and received written and verbal post-procedure care  information/education.      CUP PACEART REMOTE DEVICE CHECK      Component   Date Time Interrogation Session   [phone]     Pulse Generator Manufacturer   MERM     Pulse Gen Model   [id number] II     Pulse Gen Serial Number   [id number] [facility] Name   [facility]     Implantable Pulse Generator Type   ICM/ILR     Implantable Pulse Generator Implant Date   [id number]     Eval Rhythm   SB at 50 bpm           Linked Images                          ASSESSMENT/PLAN:    ICD-10-CM   1. Moderate nonproliferative diabetic retinopathy of both eyes with macular edema associated with type 2 diabetes mellitus (HCC)  E11.3313 OCT, Retina - OU - Both Eyes    Panretinal Photocoagulation - OD - Right Eye    2. Retinal hole of right eye  H33.321     3. Essential hypertension  I10     4. Hypertensive retinopathy of both eyes  H35.033     5. Combined forms of age-related cataract of both eyes  H25.813     6. Dry eyes  H04.123       1. Moderate non-proliferative diabetic retinopathy, OU  - s/p IVA OS #1 (05.07.21), #2 (06.04.21), #3 (07.06.21), #4 (08.03.21), #5 (08.31.21), #6 (9.28.21), #7 (11.2.21), #8 (12.07.21), #9 (02.02.22), #10 (03.30.22), #11 (05.25.22), #12 (08.03.22), #13 (10.20.22), #14 (01.12.23), #15 (01.22.24)  - s/p PRP OS (04.09.21) -- good laser  changes  - repeat FA (09.28.21) shows late leaking MA OU, significant capillary drop-out   - repeat FA (01.23.24) shows OD: Scattered patches of vascular non-perfusion; large area of vascular non perfusion temporally, no NV; OS: delayed filling; scattered patches of vascular non perfusion peripherally, staining of 360 laser, scattered leaking MA -- most prominent IT to fovea, no NV  - exam shows scattered MA/IRH/CWS OU -- improving   - BCVA OD 20/70, OS 20/40 -- both decreased (mostly cataract)  - OCT shows OD: mild diffuse retinal thinning -- stable; OS: Interval increase in cystic changes IT fovea and  mac   - recommend PRP OD today, 02.05.24 for vascular nonperfusion  - pt in agreement  - RBA of procedure discussed, questions answered  - informed consent obtained and signed OD - see procedure note - IVA informed consent obtained and signed OS (01.22.24)  - f/u 4 wks -- DFE/OCT, possible injction  2. Operculated retinal hole w/ cuff of SRF / focal RD, right eye  - operculated hole located at 1000 with partial pigment and +cuff of SRF / focal RD  - s/p retinopexy OD (03.17.21) -- good laser changes surrounding  - stable, monitor  3,4. Hypertensive retinopathy OU  - discussed importance of tight BP control  - monitor  5. Mixed form age related cataract OU  - The symptoms of cataract, surgical options, and treatments and risks were discussed with patient.  - discussed diagnosis and progression  - under the expert management of Dr. Zenia Resides - clear from a retina standpoint to proceed with cataract surgery when both patient and surgeon are ready  6. Dry eyes OU  - recommend artificial tears and lubricating ointment as needed  Ophthalmic Meds Ordered this visit:  Meds ordered this encounter  Medications   prednisoLONE acetate (PRED FORTE) 1 % ophthalmic suspension    Sig: Place 1 drop into the right eye 4 (four) times daily for 7 days.    Dispense:  10 mL    Refill:  0     Return for Feb 29 or later - NPDR OU, DFE, OCT, Possible Injxn.  There are no Patient Instructions on file for this visit.  Explained the diagnoses, plan, and follow up with the patient and they expressed understanding.  Patient expressed understanding of the importance of proper follow up care.   This document serves as a record of services personally performed by Gardiner Sleeper, MD, PhD. It was created on their behalf by Roselee Nova, COMT. The creation of this record is the provider's dictation and/or activities during the visit.  Electronically signed by: Roselee Nova, COMT 01/26/23 1:10 PM  Gardiner Sleeper, M.D., Ph.D. Diseases & Surgery of the Retina and [place]  I have reviewed the above documentation for accuracy and completeness, and I agree with the above. Gardiner Sleeper, M.D., Ph.D. 01/26/23 1:11 PM  Abbreviations: M myopia (nearsighted); A astigmatism; H hyperopia (farsighted); P presbyopia; Mrx spectacle prescription;  CTL contact lenses; OD right eye; OS left eye; OU both eyes  XT exotropia; ET esotropia; PEK punctate epithelial keratitis; PEE punctate epithelial erosions; DES dry eye syndrome; MGD meibomian gland dysfunction; ATs artificial tears; PFAT's preservative free artificial tears; [place] nuclear sclerotic cataract; PSC posterior subcapsular cataract; ERM epi-retinal membrane; PVD posterior vitreous detachment; RD retinal detachment; DM diabetes mellitus; DR diabetic retinopathy; NPDR non-proliferative diabetic retinopathy; PDR proliferative diabetic retinopathy; CSME clinically significant macular edema; DME diabetic macular edema; dbh  dot blot hemorrhages; CWS cotton wool spot; POAG primary open angle glaucoma; C/D cup-to-disc ratio; HVF humphrey visual field; GVF goldmann visual field; OCT optical coherence tomography; IOP intraocular pressure; BRVO Branch retinal vein occlusion; CRVO central retinal vein occlusion; CRAO central retinal artery occlusion; BRAO branch retinal artery occlusion; RT retinal tear; SB scleral buckle; PPV pars plana vitrectomy; VH Vitreous hemorrhage; PRP panretinal laser photocoagulation; IVK intravitreal kenalog; VMT vitreomacular traction; MH Macular hole;  NVD neovascularization of the disc; NVE neovascularization elsewhere; AREDS age related eye disease study; ARMD age related macular degeneration; POAG primary open angle glaucoma; EBMD epithelial/anterior basement membrane dystrophy; ACIOL anterior chamber intraocular lens; IOL intraocular lens; PCIOL posterior chamber intraocular lens; Phaco/IOL phacoemulsification  with intraocular lens placement; [place] photorefractive keratectomy; LASIK laser assisted in situ keratomileusis; HTN hypertension; DM diabetes mellitus; COPD chronic obstructive pulmonary disease

## 2023-01-23 NOTE — Progress Notes (Signed)
[facility] Summary Report / Loop Recorder 

## 2023-01-26 ENCOUNTER — Ambulatory Visit: Payer: Medicare HMO

## 2023-01-26 ENCOUNTER — Encounter (INDEPENDENT_AMBULATORY_CARE_PROVIDER_SITE_OTHER): Payer: Self-pay | Admitting: Ophthalmology

## 2023-01-26 ENCOUNTER — Ambulatory Visit (INDEPENDENT_AMBULATORY_CARE_PROVIDER_SITE_OTHER): Payer: Medicare HMO | Admitting: Ophthalmology

## 2023-01-26 DIAGNOSIS — I639 Cerebral infarction, unspecified: Secondary | ICD-10-CM

## 2023-01-26 DIAGNOSIS — I10 Essential (primary) hypertension: Secondary | ICD-10-CM

## 2023-01-26 DIAGNOSIS — E113313 Type 2 diabetes mellitus with moderate nonproliferative diabetic retinopathy with macular edema, bilateral: Secondary | ICD-10-CM | POA: Diagnosis not present

## 2023-01-26 DIAGNOSIS — H35033 Hypertensive retinopathy, bilateral: Secondary | ICD-10-CM

## 2023-01-26 DIAGNOSIS — H04123 Dry eye syndrome of bilateral lacrimal glands: Secondary | ICD-10-CM

## 2023-01-26 DIAGNOSIS — H33321 Round hole, right eye: Secondary | ICD-10-CM

## 2023-01-26 DIAGNOSIS — H25813 Combined forms of age-related cataract, bilateral: Secondary | ICD-10-CM

## 2023-01-26 MED ORDER — PREDNISOLONE ACETATE 1 % OP SUSP: 1.0000 [drp] | Freq: Four times a day (QID) | OPHTHALMIC | 0 refills | Status: AC

## 2023-02-02 ENCOUNTER — Other Ambulatory Visit: Payer: Self-pay | Admitting: Nurse Practitioner

## 2023-02-09 ENCOUNTER — Other Ambulatory Visit: Payer: Self-pay

## 2023-02-12 ENCOUNTER — Other Ambulatory Visit: Payer: Self-pay

## 2023-02-12 MED ORDER — POTASSIUM CHLORIDE ER 20 MEQ PO TBCR: 1.0000 | EXTENDED_RELEASE_TABLET | Freq: Every day | ORAL | 1 refills | Status: DC

## 2023-02-19 ENCOUNTER — Telehealth: Payer: Self-pay | Admitting: *Deleted

## 2023-02-19 NOTE — Telephone Encounter (Signed)
A message was [id number] his test (CT Chest).

## 2023-02-24 ENCOUNTER — Ambulatory Visit: Payer: Medicare HMO | Attending: Nurse Practitioner

## 2023-02-24 DIAGNOSIS — I10 Essential (primary) hypertension: Secondary | ICD-10-CM

## 2023-02-24 DIAGNOSIS — E785 Hyperlipidemia, unspecified: Secondary | ICD-10-CM

## 2023-02-24 DIAGNOSIS — I77810 Thoracic aortic ectasia: Secondary | ICD-10-CM

## 2023-02-24 DIAGNOSIS — I5040 Unspecified combined systolic (congestive) and diastolic (congestive) heart failure: Secondary | ICD-10-CM

## 2023-02-24 DIAGNOSIS — R600 Localized edema: Secondary | ICD-10-CM

## 2023-02-24 NOTE — Progress Notes (Signed)
[facility] Note  03/02/2023     CHIEF COMPLAINT Patient presents for Retina Follow Up   HISTORY OF PRESENT ILLNESS: [name] is a 72 y.o. male who presents to the clinic today for:   HPI     Retina Follow Up   Patient presents with  Diabetic Retinopathy.  In both eyes.  This started 5 weeks ago.  I, the attending physician,  performed the HPI with the patient and updated documentation appropriately.        Comments   Patient here for 5 weeks retina follow up for NPDR OU. Patient states vision doing fine. No eye pain. They itch and water. Uses drops.      Last edited by [name], MD on 03/02/2023 11:24 AM.      Referring physician: [name], MD [address] [place],  [place] [address]  HISTORICAL INFORMATION:   Selected notes from the MEDICAL RECORD NUMBER Referred by Dr. Wyatt Portela for concern of HTN Ret   CURRENT MEDICATIONS: Current Outpatient Medications (Ophthalmic Drugs)  Medication Sig   latanoprost (XALATAN) 0.005 % ophthalmic solution SMARTSIG:In Eye(s)   Polyvinyl Alcohol-Povidone (REFRESH OP) Place 1 drop into both eyes in the morning and at bedtime.   No current facility-administered medications for this visit. (Ophthalmic Drugs)   Current Outpatient Medications (Other)  Medication Sig   aspirin 325 MG EC tablet aspirin 325 mg tablet,delayed release   atorvastatin (LIPITOR) 40 MG tablet Take 1 tablet (40 mg total) by mouth daily at 6 PM.   carvedilol (COREG) 6.25 MG tablet Take 6.25 mg by mouth 2 (two) times daily with a meal.   donepezil (ARICEPT) 10 MG tablet TAKE 1 TABLET AT BEDTIME   furosemide (LASIX) 40 MG tablet Take 40 mg by mouth daily.   hydrochlorothiazide (HYDRODIURIL) 25 MG tablet Take 25 mg by mouth daily.   losartan (COZAAR) 100 MG tablet Take 100 mg by mouth daily.   memantine (NAMENDA) 10 MG tablet TAKE 1 TABLET TWICE DAILY   metFORMIN (GLUCOPHAGE) 500 MG tablet Take by mouth  2 (two) times daily with a meal.   Multiple Vitamins-Minerals (ONE-A-DAY MENS 50+ PO) Take 1 tablet by mouth daily.   mupirocin ointment (BACTROBAN) 2 % as needed.   PFIZER-BIONT COVID-19 VAC-TRIS SUSP injection    PFIZER-BIONTECH COVID-19 VACC 30 MCG/0.3ML injection    Potassium Chloride ER 20 MEQ TBCR Take 1 tablet (20 mEq total) by mouth daily.   Current Facility-Administered Medications (Other)  Medication Route   0.9 %  sodium chloride infusion Intravenous   0.9 %  sodium chloride infusion Intravenous   REVIEW OF SYSTEMS: ROS   Positive for: Endocrine, Eyes Negative for: Constitutional, Gastrointestinal, Neurological, Skin, Genitourinary, Musculoskeletal, HENT, Cardiovascular, Respiratory, Psychiatric, Allergic/Imm, Heme/Lymph Last edited by Theodore Demark, COA on 03/02/2023  9:43 AM.     ALLERGIES Allergies  Allergen Reactions   Shellfish Allergy Anaphylaxis   PAST MEDICAL HISTORY Past Medical History:  Diagnosis Date   Acute cerebrovascular accident (CVA) ([place]) 01/20/2020   Benign essential HTN 01/20/2020   Cataract    Mixed form OU   Diabetic retinopathy ([place])    NPDR OU   Hypertensive retinopathy    OU   Seizures ([place])    per pt- "none since teenage years"   Past Surgical History:  Procedure Laterality Date   LOOP RECORDER INSERTION N/A 01/23/2020   Procedure: LOOP RECORDER INSERTION;  Surgeon: Deboraha Sprang, MD;  Location: [facility];  Service: Cardiovascular;  Laterality: N/A;   FAMILY HISTORY Family History  Problem Relation Age of Onset   COPD Mother    Diabetes Mother    Dementia Father    Diabetes Father    Cancer Father        prostate and lung   Colon cancer Neg Hx    Esophageal cancer Neg Hx    Stomach cancer Neg Hx    Rectal cancer Neg Hx    SOCIAL HISTORY Social History   Tobacco Use   Smoking status: Never   Smokeless tobacco: Never  Vaping Use   Vaping Use: Never used  Substance Use Topics   Alcohol use: Never   Drug  use: Never       OPHTHALMIC EXAM: Base Eye Exam     Visual Acuity (Snellen - Linear)       Right Left   Dist [place] 20/70 -1 20/40 -1   Dist ph [place] 20/50 -1 20/30 +2         Tonometry (Tonopen, 9:40 AM)       Right Left   Pressure 12 10         Pupils       Dark Light Shape React APD   Right 3 2 Round Brisk None   Left 3 2 Round Brisk None         Visual Fields (Counting fingers)       Left Right    Full Full         Extraocular Movement       Right Left    Full, Ortho Full, Ortho         Neuro/Psych     Oriented x3: Yes   Mood/Affect: Normal         Dilation     Both eyes: 1.0% Mydriacyl, 2.5% Phenylephrine @ 9:40 AM           Slit Lamp and Fundus Exam     Slit Lamp Exam       Right Left   Lids/Lashes Dermatochalasis - upper lid, mild MGD Dermatochalasis - upper lid   Conjunctiva/Sclera Nasal and temporal Pinguecula, , Melanosis Nasal and temporal Pinguecula, mild Melanosis   Cornea Arcus, 2-3+Punctate epithelial erosions Arcus, 2+ Punctate epithelial erosions with irregular epi inferiorly, mild debris in tear film   Anterior Chamber Deep and quiet Deep and quiet   Iris Round and dilated, No NVI Round and dilated, No NVI   Lens 3+ Nuclear sclerosis, 3+ Cortical cataract 3+ Nuclear sclerosis, 3+ Cortical cataract   Anterior Vitreous Vitreous syneresis Vitreous syneresis, Posterior vitreous detachment         Fundus Exam       Right Left   Disc sharp rim, mild pallor, +cupping Sharp, mild Pallor   C/D Ratio 0.6 0.5   Macula Flat, good foveal reflex, rare MA, scattered cystic changes -- improved, No frank edema Flat, good foveal reflex, scattered Microaneurysms/DBH, trace cystic changes IT macula -- slightly improved   Vessels attenuated, mild tortuosity attenuated, Tortuous   Periphery Attached, operculated hole at 1000 with partial pigment and +cuff of SRF -- good laser changes surrounding, scattered MA/DBH greatest posteriorly,  White without pressure temporal periphery, early peripheral PRP changes 360 Attached, scattered DBH, White without pressure temporally, focal pigmented CR scar at 0130 equator, good peripheral 360 PRP           IMAGING AND PROCEDURES  Imaging and Procedures for @  TODAY@  OCT, Retina - OU - Both Eyes       Right Eye Quality was borderline. Central Foveal Thickness: 243. Progression has been stable. Findings include normal foveal contour, no IRF, no SRF (mild diffuse retinal thinning -- stable).   Left Eye Quality was borderline. Central Foveal Thickness: 179. Progression has improved. Findings include normal foveal contour, no SRF, intraretinal hyper-reflective material, intraretinal fluid (Interval improvment in cystic changes IT fovea and mac ).   Notes *Images captured and stored on drive  Diagnosis / Impression:  OD: NFP, no IRF / SRF -- mild diffuse retinal thinning -- stable OS: NFP, no SRF, Interval improvement in cystic changes IT fovea and mac    Clinical management:  See below  Abbreviations: NFP - Normal foveal profile. CME - cystoid macular edema. PED - pigment epithelial detachment. IRF - intraretinal fluid. SRF - subretinal fluid. EZ - ellipsoid zone. ERM - epiretinal membrane. ORA - outer retinal atrophy. ORT - outer retinal tubulation. SRHM - subretinal hyper-reflective material      Intravitreal Injection, Pharmacologic Agent - OS - Left Eye       Time Out 03/02/2023. 10:46 AM. Confirmed correct patient, procedure, site, and patient consented.   Anesthesia Topical anesthesia was used. Anesthetic medications included Lidocaine 2%, Proparacaine 0.5%.   Procedure Preparation included 5% betadine to ocular surface, eyelid speculum. A (32g) needle was used.   Injection: 1.25 mg Bevacizumab 1.25mg /0.98ml   Route: Intravitreal, Site: Left Eye   NDC: H061816, Lot: [id number], Expiration date: 05/02/2023   Post-op Post injection exam found visual acuity of at  least counting fingers. The patient tolerated the procedure well. There were no complications. The patient received written and verbal post procedure care education.            ASSESSMENT/PLAN:    ICD-10-CM   1. Moderate nonproliferative diabetic retinopathy of both eyes with macular edema associated with type 2 diabetes mellitus (HCC)  E11.3313 OCT, Retina - OU - Both Eyes    Intravitreal Injection, Pharmacologic Agent - OS - Left Eye    Bevacizumab (AVASTIN) SOLN 1.25 mg    2. Retinal hole of right eye  H33.321     3. Essential hypertension  I10     4. Hypertensive retinopathy of both eyes  H35.033     5. Combined forms of age-related cataract of both eyes  H25.813     6. Dry eyes  H04.123      1. Moderate non-proliferative diabetic retinopathy, OU - s/p IVA OS #1 (05.07.21), #2 (06.04.21), #3 (07.06.21), #4 (08.03.21), #5 (08.31.21), #6 (9.28.21), #7 (11.2.21), #8 (12.07.21), #9 (02.02.22), #10 (03.30.22), #11 (05.25.22), #12 (08.03.22), #13 (10.20.22), #14 (01.12.23), #15 (01.22.24)  - s/p PRP OS (04.09.21) -- good laser changes  - s/p PRP OD (02.05.24) for peripheral vascular nonperfusion -- good early laser changes  - repeat FA (09.28.21) shows late leaking MA OU, significant capillary drop-out  - repeat FA (01.23.24) shows OD: Scattered patches of vascular non-perfusion; large area of vascular non perfusion temporally, no NV; OS: delayed filling; scattered patches of vascular non perfusion peripherally, staining of 360 laser, scattered leaking MA -- most prominent IT to fovea, no NV  - exam shows scattered MA/IRH/CWS OU -- improving   - BCVA OD 20/50, OS 20/30 -- both improved - OCT shows OD: mild diffuse retinal thinning -- stable; OS: Interval improvement in cystic changes IT fovea and mac @ 7 weeks - recommend IVA OS #16 today, 03.11.24 w/ f/u in  6 wks  - pt in agreement  - RBA of procedure discussed, questions answered - IVA informed consent obtained and re-signed OS  (01.22.24) - see procedure note  - f/u 6 wks -- DFE/OCT, possible injction  2. Operculated retinal hole w/ cuff of SRF / focal RD, right eye - operculated hole located at 1000 with partial pigment and +cuff of SRF / focal RD  - s/p retinopexy OD (03.17.21) -- good laser changes surrounding  - stable, monitor  3,4. Hypertensive retinopathy OU  - discussed importance of tight BP control  - monitor  5. Mixed form age related cataract OU - The symptoms of cataract, surgical options, and treatments and risks were discussed with patient.  - discussed diagnosis and progression  - under the expert management of Dr. Zenia Resides - clear from a retina standpoint to proceed with cataract surgery when both patient and surgeon are ready  6. Dry eyes OU  - recommend artificial tears and lubricating ointment as needed  Ophthalmic Meds Ordered this visit:  Meds ordered this encounter  Medications   Bevacizumab (AVASTIN) SOLN 1.25 mg     Return in about 6 weeks (around 04/13/2023) for f/u NPDR OU , DFE, OCT, Possible, IVA.  There are no Patient Instructions on file for this visit.  Explained the diagnoses, plan, and follow up with the patient and they expressed understanding.  Patient expressed understanding of the importance of proper follow up care.   This document serves as a record of services personally performed by Gardiner Sleeper, MD, PhD. It was created on their behalf by Roselee Nova, COMT. The creation of this record is the provider's dictation and/or activities during the visit.  Electronically signed by: Roselee Nova, COMT 03/02/23 11:33 AM  This document serves as a record of services personally performed by Gardiner Sleeper, MD, PhD. It was created on their behalf by Renaldo Reel, Steele an ophthalmic technician. The creation of this record is the provider's dictation and/or activities during the visit.    Electronically signed by:  Renaldo Reel, COT  03.11.24 11:33  AM  Gardiner Sleeper, M.D., Ph.D. Diseases & Surgery of the Retina and Vitreous [facility]  I have reviewed the above documentation for accuracy and completeness, and I agree with the above. Gardiner Sleeper, M.D., Ph.D. 03/02/23 11:33 AM   Abbreviations: M myopia (nearsighted); A astigmatism; H hyperopia (farsighted); P presbyopia; Mrx spectacle prescription;  CTL contact lenses; OD right eye; OS left eye; OU both eyes  XT exotropia; ET esotropia; PEK punctate epithelial keratitis; PEE punctate epithelial erosions; DES dry eye syndrome; MGD meibomian gland dysfunction; ATs artificial tears; PFAT's preservative free artificial tears; [place] nuclear sclerotic cataract; PSC posterior subcapsular cataract; ERM epi-retinal membrane; PVD posterior vitreous detachment; RD retinal detachment; DM diabetes mellitus; DR diabetic retinopathy; NPDR non-proliferative diabetic retinopathy; PDR proliferative diabetic retinopathy; CSME clinically significant macular edema; DME diabetic macular edema; dbh dot blot hemorrhages; CWS cotton wool spot; POAG primary open angle glaucoma; C/D cup-to-disc ratio; HVF humphrey visual field; GVF goldmann visual field; OCT optical coherence tomography; IOP intraocular pressure; BRVO Branch retinal vein occlusion; CRVO central retinal vein occlusion; CRAO central retinal artery occlusion; BRAO branch retinal artery occlusion; RT retinal tear; SB scleral buckle; PPV pars plana vitrectomy; VH Vitreous hemorrhage; PRP panretinal laser photocoagulation; IVK intravitreal kenalog; VMT vitreomacular traction; MH Macular hole;  NVD neovascularization of the disc; NVE neovascularization elsewhere; AREDS age related eye disease study; ARMD age related macular  degeneration; POAG primary open angle glaucoma; EBMD epithelial/anterior basement membrane dystrophy; ACIOL anterior chamber intraocular lens; IOL intraocular lens; PCIOL posterior chamber intraocular lens; Phaco/IOL  phacoemulsification with intraocular lens placement; [place] photorefractive keratectomy; LASIK laser assisted in situ keratomileusis; HTN hypertension; DM diabetes mellitus; COPD chronic obstructive pulmonary disease

## 2023-02-25 LAB — LIPID PANEL
Chol/HDL Ratio: 2.6 ratio (ref 0.0–5.0)
Cholesterol, Total: 119 mg/dL (ref 100–199)
HDL: 46 mg/dL (ref >39)
LDL Chol Calc (NIH): 62 mg/dL (ref 0–99)
Triglycerides: 46 mg/dL (ref 0–149)
VLDL Cholesterol Cal: 11 mg/dL (ref 5–40)

## 2023-03-02 ENCOUNTER — Ambulatory Visit (INDEPENDENT_AMBULATORY_CARE_PROVIDER_SITE_OTHER): Payer: Medicare HMO

## 2023-03-02 ENCOUNTER — Ambulatory Visit (INDEPENDENT_AMBULATORY_CARE_PROVIDER_SITE_OTHER): Payer: Medicare HMO | Admitting: Ophthalmology

## 2023-03-02 ENCOUNTER — Encounter (INDEPENDENT_AMBULATORY_CARE_PROVIDER_SITE_OTHER): Payer: Self-pay | Admitting: Ophthalmology

## 2023-03-02 DIAGNOSIS — H33321 Round hole, right eye: Secondary | ICD-10-CM | POA: Diagnosis not present

## 2023-03-02 DIAGNOSIS — I10 Essential (primary) hypertension: Secondary | ICD-10-CM

## 2023-03-02 DIAGNOSIS — E113313 Type 2 diabetes mellitus with moderate nonproliferative diabetic retinopathy with macular edema, bilateral: Secondary | ICD-10-CM

## 2023-03-02 DIAGNOSIS — H25813 Combined forms of age-related cataract, bilateral: Secondary | ICD-10-CM

## 2023-03-02 DIAGNOSIS — I639 Cerebral infarction, unspecified: Secondary | ICD-10-CM

## 2023-03-02 DIAGNOSIS — H04123 Dry eye syndrome of bilateral lacrimal glands: Secondary | ICD-10-CM

## 2023-03-02 DIAGNOSIS — H35033 Hypertensive retinopathy, bilateral: Secondary | ICD-10-CM | POA: Diagnosis not present

## 2023-03-02 MED ORDER — BEVACIZUMAB CHEMO INJECTION 1.25MG/0.05ML SYRINGE FOR KALEIDOSCOPE
1.2500 mg | INTRAVITREAL | Status: AC | PRN
  Administered 2023-03-02: 1.25 mg via INTRAVITREAL

## 2023-03-04 LAB — CUP PACEART REMOTE DEVICE CHECK
Date Time Interrogation Session: 20240310232103
Implantable Pulse Generator Implant Date: 20210201

## 2023-03-12 NOTE — Progress Notes (Signed)
[facility] Summary Report / Loop Recorder 
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
## 2023-03-17 ENCOUNTER — Encounter: Payer: Self-pay | Admitting: Adult Health

## 2023-03-17 ENCOUNTER — Ambulatory Visit: Payer: Medicare HMO | Admitting: Adult Health

## 2023-03-17 VITALS — BP 141/69 | HR 57 | Ht 67.0 in | Wt 193.0 lb

## 2023-03-17 DIAGNOSIS — F01B11 Vascular dementia, moderate, with agitation: Secondary | ICD-10-CM

## 2023-03-17 DIAGNOSIS — I639 Cerebral infarction, unspecified: Secondary | ICD-10-CM

## 2023-03-17 NOTE — Patient Instructions (Addendum)
Your Plan:  Continue current treatment plan  Please call with concern of any worsening behaviors     Follow-up in 6 months or call earlier if needed     Thank you for coming to see [place] at [facility]. I hope we have been able to provide you high quality care today.  You may receive a patient satisfaction survey over the next few weeks. We would appreciate your feedback and comments so that we may continue to improve ourselves and the health of our patients.    Dementia Caregiver Guide Dementia is a term used to describe a number of symptoms that affect memory and thinking. The most common symptoms include: Memory loss. Trouble with language and communication. Trouble concentrating. Poor judgment and problems with reasoning. Wandering from home or public places. Extreme anxiety or depression. Being suspicious or having angry outbursts and accusations. Child-like behavior and language. Dementia can be frightening and confusing. And taking care of someone with dementia can be challenging. This guide provides tips to help you when providing care for a person with dementia. How to help manage lifestyle changes Dementia usually gets worse slowly over time. In the early stages, people with dementia can stay independent and safe with some help. In later stages, they need help with daily tasks such as dressing, grooming, and using the bathroom. There are actions you can take to help a person manage his or her life while living with this condition. Communicating When the person is talking or seems frustrated, make eye contact and hold the person's hand. Ask specific questions that need yes or no answers. Use simple words, short sentences, and a calm voice. Only give one direction at a time. When offering choices, limit the person to just one or two. Avoid correcting the person in a negative way. If the person is struggling to find the right words, gently try to help him  or her. Preventing injury  Keep floors clear of clutter. Remove rugs, magazine racks, and floor lamps. Keep hallways well lit, especially at night. Put a handrail and nonslip mat in the bathtub or shower. Put childproof locks on cabinets that contain dangerous items, such as medicines, alcohol, guns, toxic cleaning items, sharp tools or utensils, matches, and lighters. For doors to the outside of the house, put the locks in places where the person cannot see or reach them easily. This will help ensure that the person does not wander out of the house and get lost. Be prepared for emergencies. Keep a list of emergency phone numbers and addresses in a convenient area. Remove car keys and lock garage doors so that the person does not try to get in the car and drive. Have the person wear a bracelet that tracks locations and identifies the person as having memory problems. This should be worn at all times for safety. Helping with daily life  Keep the person on track with his or her routine. Try to identify areas where the person may need help. Be supportive, patient, calm, and encouraging. Gently remind the person that adjusting to changes takes time. Help with the tasks that the person has asked for help with. Keep the person involved in daily tasks and decisions as much as possible. Encourage conversation, but try not to get frustrated if the person struggles to find words or does not seem to appreciate your help. How to recognize stress Look for signs of stress in yourself and in the person you are caring for. If you  notice signs of stress, take steps to manage it. Symptoms of stress include: Feeling anxious, irritable, frustrated, or angry. Denying that the person has dementia or that his or her symptoms will not improve. Feeling depressed, hopeless, or unappreciated. Difficulty sleeping. Difficulty concentrating. Developing stress-related health problems. Feeling like you have too little  time for your own life. Follow these instructions at home: Take care of your health Make sure that you and the person you are caring for: Get regular sleep. Exercise regularly. Eat regular, nutritious meals. Take over-the-counter and prescription medicines only as told by your health care providers. Drink enough fluid to keep your urine pale yellow. Attend all scheduled health care appointments.  General instructions Join a support group with others who are caregivers. Ask about respite care resources. Respite care can provide short-term care for the person so that you can have a regular break from the stress of caregiving. Consider any safety risks and take steps to avoid them. Organize medicines in a pill box for each day of the week. Create a plan to handle any legal or financial matters. Get legal or financial advice if needed. Keep a calendar in a central location to remind the person of appointments or other activities. Where to find support: Many individuals and organizations offer support. These include: Support groups for people with dementia. Support groups for caregivers. Counselors or therapists. Home health care services. Adult day care centers. Where to find more information Centers for Disease Control and Prevention: http://www.wolf.info/ Alzheimer's Association: [employer].co.nz Family Caregiver Alliance: www.caregiver.[place]: www.alzfdn.org Contact a health care provider if: The person's health is rapidly getting worse. You are no longer able to care for the person. Caring for the person is affecting your physical and emotional health. You are feeling depressed or anxious about caring for the person. Get help right away if: The person threatens himself or herself, you, or anyone else. You feel depressed or sad, or feel that you want to harm yourself. If you ever feel like your loved one may hurt himself or herself or others, or if he or she  shares thoughts about taking his or her own life, get help right away. You can go to your nearest emergency department or: Call your local emergency services (911 in the [place] Call a suicide crisis helpline, such as the [place] at 201-424-2724 or 988 in the [place]. This is open 24 hours a day in the [place] Text the Crisis Text Line at 919-610-9817 (in the [place].). Summary Dementia is a term used to describe a number of symptoms that affect memory and thinking. Dementia usually gets worse slowly over time. Take steps to reduce the person's risk of injury and to plan for future care. Caregivers need support, relief from caregiving, and time for their own lives. This information is not intended to replace advice given to you by your health care provider. Make sure you discuss any questions you have with your health care provider. Document Revised: 07/03/2021 Document Reviewed: 04/23/2020 Elsevier Patient Education  [place].

## 2023-04-06 ENCOUNTER — Ambulatory Visit (INDEPENDENT_AMBULATORY_CARE_PROVIDER_SITE_OTHER): Payer: Medicare HMO

## 2023-04-06 DIAGNOSIS — I639 Cerebral infarction, unspecified: Secondary | ICD-10-CM

## 2023-04-07 LAB — CUP PACEART REMOTE DEVICE CHECK
Date Time Interrogation Session: 20240414230927
Implantable Pulse Generator Implant Date: 20210201

## 2023-04-08 NOTE — Progress Notes (Signed)
[facility] - Clinic Note  04/13/2023     CHIEF COMPLAINT Patient presents for Retina Follow Up   HISTORY OF PRESENT ILLNESS: [name] is a 72 y.o. male who presents to the clinic today for:   HPI     Retina Follow Up   Patient presents with  Diabetic Retinopathy.  In both eyes.  This started 6 weeks ago.  Duration of 6 weeks.  Since onset it is stable.  I, the attending physician,  performed the HPI with the patient and updated documentation appropriately.        Comments   6 week retina follow up NPDR OU pt is reporting no vision changes noticed he is having some watering and itching pt denies any flashes or floaters       Last edited by Rennis Chris, MD on 04/13/2023  1:46 PM.     Referring physician: Raymon Mutton., FNP [address] [place],  [place] [address]  HISTORICAL INFORMATION:   Selected notes from the MEDICAL RECORD NUMBER Referred by Dr. Fabian Sharp for concern of HTN Ret   CURRENT MEDICATIONS: Current Outpatient Medications (Ophthalmic Drugs)  Medication Sig   latanoprost (XALATAN) 0.005 % ophthalmic solution SMARTSIG:In Eye(s)   Polyvinyl Alcohol-Povidone (REFRESH OP) Place 1 drop into both eyes in the morning and at bedtime.   No current facility-administered medications for this visit. (Ophthalmic Drugs)   Current Outpatient Medications (Other)  Medication Sig   aspirin 325 MG EC tablet aspirin 325 mg tablet,delayed release   atorvastatin (LIPITOR) 40 MG tablet Take 1 tablet (40 mg total) by mouth daily at 6 PM.   carvedilol (COREG) 6.25 MG tablet Take 6.25 mg by mouth 2 (two) times daily with a meal.   donepezil (ARICEPT) 10 MG tablet TAKE 1 TABLET AT BEDTIME   furosemide (LASIX) 40 MG tablet Take 40 mg by mouth daily.   hydrochlorothiazide (HYDRODIURIL) 25 MG tablet Take 25 mg by mouth daily.   losartan (COZAAR) 100 MG tablet Take 100 mg by mouth daily.   memantine (NAMENDA) 10 MG tablet TAKE 1 TABLET TWICE DAILY    metFORMIN (GLUCOPHAGE) 500 MG tablet Take by mouth 2 (two) times daily with a meal.   Multiple Vitamins-Minerals (ONE-A-DAY MENS 50+ PO) Take 1 tablet by mouth daily.   mupirocin ointment (BACTROBAN) 2 % as needed.   PFIZER-BIONT COVID-19 VAC-TRIS SUSP injection    PFIZER-BIONTECH COVID-19 VACC 30 MCG/0.3ML injection    Potassium Chloride ER 20 MEQ TBCR Take 1 tablet (20 mEq total) by mouth daily.   traZODone (DESYREL) 100 MG tablet Take 100 mg by mouth at bedtime.   Current Facility-Administered Medications (Other)  Medication Route   0.9 %  sodium chloride infusion Intravenous   0.9 %  sodium chloride infusion Intravenous   REVIEW OF SYSTEMS: ROS   Positive for: Endocrine, Eyes Negative for: Constitutional, Gastrointestinal, Neurological, Skin, Genitourinary, Musculoskeletal, HENT, Cardiovascular, Respiratory, Psychiatric, Allergic/Imm, Heme/Lymph Last edited by Etheleen Mayhew, COT on 04/13/2023  9:31 AM.      ALLERGIES Allergies  Allergen Reactions   Shellfish Allergy Anaphylaxis   PAST MEDICAL HISTORY Past Medical History:  Diagnosis Date   Acute cerebrovascular accident (CVA) 01/20/2020   Benign essential HTN 01/20/2020   Cataract    Mixed form OU   Diabetic retinopathy    NPDR OU   Hypertensive retinopathy    OU   Seizures    per pt- "none since teenage years"   Past Surgical  History:  Procedure Laterality Date   LOOP RECORDER INSERTION N/A 01/23/2020   Procedure: LOOP RECORDER INSERTION;  Surgeon: Duke Salvia, MD;  Location: [facility] INVASIVE CV LAB;  Service: Cardiovascular;  Laterality: N/A;   FAMILY HISTORY Family History  Problem Relation Age of Onset   COPD Mother    Diabetes Mother    Dementia Father    Diabetes Father    Cancer Father        prostate and lung   Colon cancer Neg Hx    Esophageal cancer Neg Hx    Stomach cancer Neg Hx    Rectal cancer Neg Hx    SOCIAL HISTORY Social History   Tobacco Use   Smoking status: Never    Smokeless tobacco: Never  Vaping Use   Vaping Use: Never used  Substance Use Topics   Alcohol use: Never   Drug use: Never       OPHTHALMIC EXAM: Base Eye Exam     Visual Acuity (Snellen - Linear)       Right Left   Dist [place] 20/70 -2 20/50 -2   Dist ph [place] 20/60 20/40 -2         Tonometry (Tonopen, 9:35 AM)       Right Left   Pressure 12 12         Pupils       Pupils Dark Light Shape React APD   Right PERRL 3 2 Round Brisk None   Left PERRL 3 2 Round Brisk None         Visual Fields       Left Right    Full Full         Extraocular Movement       Right Left    Full, Ortho Full, Ortho         Neuro/Psych     Oriented x3: Yes   Mood/Affect: Normal         Dilation     Both eyes: 2.5% Phenylephrine @ 9:35 AM           Slit Lamp and Fundus Exam     Slit Lamp Exam       Right Left   Lids/Lashes Dermatochalasis - upper lid, mild MGD Dermatochalasis - upper lid   Conjunctiva/Sclera Nasal and temporal Pinguecula, , Melanosis Nasal and temporal Pinguecula, mild Melanosis   Cornea Arcus, 2-3+Punctate epithelial erosions Arcus, 2+ Punctate epithelial erosions with irregular epi inferiorly, mild debris in tear film   Anterior Chamber Deep and quiet Deep and quiet   Iris Round and dilated, No NVI Round and dilated, No NVI   Lens 3+ Nuclear sclerosis, 3+ Cortical cataract 3+ Nuclear sclerosis, 3+ Cortical cataract   Anterior Vitreous Vitreous syneresis Vitreous syneresis, Posterior vitreous detachment         Fundus Exam       Right Left   Disc sharp rim, mild pallor, +cupping Sharp, mild Pallor   C/D Ratio 0.6 0.5   Macula Flat, good foveal reflex, rare MA, scattered cystic changes -- improved, No frank edema Flat, good foveal reflex, scattered Microaneurysms/DBH, trace cystic changes IT macula   Vessels attenuated, mild tortuosity attenuated, Tortuous   Periphery Attached, operculated hole at 1000 with partial pigment and +cuff of SRF  -- good laser changes surrounding, scattered MA/DBH greatest posteriorly, White without pressure temporal periphery, good peripheral PRP changes 360 Attached, scattered DBH, focal pigmented CR scar at 0130 equator, good peripheral 360 PRP  IMAGING AND PROCEDURES  Imaging and Procedures for @TODAY @  OCT, Retina - OU - Both Eyes       Right Eye Quality was good. Central Foveal Thickness: 239. Progression has been stable. Findings include normal foveal contour, no IRF, no SRF (mild diffuse retinal thinning -- stable).   Left Eye Quality was borderline. Central Foveal Thickness: 261. Progression has been stable. Findings include normal foveal contour, no SRF, intraretinal hyper-reflective material, intraretinal fluid (Persistent cystic changes IT fovea and mac ).   Notes *Images captured and stored on drive  Diagnosis / Impression:  OD: NFP, no IRF / SRF -- mild diffuse retinal thinning -- stable OS: NFP, no SRF, Persistent cystic changes IT fovea and mac    Clinical management:  See below  Abbreviations: NFP - Normal foveal profile. CME - cystoid macular edema. PED - pigment epithelial detachment. IRF - intraretinal fluid. SRF - subretinal fluid. EZ - ellipsoid zone. ERM - epiretinal membrane. ORA - outer retinal atrophy. ORT - outer retinal tubulation. SRHM - subretinal hyper-reflective material      Intravitreal Injection, Pharmacologic Agent - OS - Left Eye       Time Out 04/13/2023. 10:30 AM. Confirmed correct patient, procedure, site, and patient consented.   Anesthesia Topical anesthesia was used. Anesthetic medications included Lidocaine 2%, Proparacaine 0.5%.   Procedure Preparation included 5% betadine to ocular surface, eyelid speculum. A (32g) needle was used.   Injection: 1.25 mg Bevacizumab 1.25mg /0.34ml   Route: Intravitreal, Site: Left Eye   NDC: [id number], Lot: [id number], Expiration date: 07/04/2023   Post-op Post injection exam found visual  acuity of at least counting fingers. The patient tolerated the procedure well. There were no complications. The patient received written and verbal post procedure care education.             ASSESSMENT/PLAN:    ICD-10-CM   1. Moderate nonproliferative diabetic retinopathy of both eyes with macular edema associated with type 2 diabetes mellitus  E11.3313 OCT, Retina - OU - Both Eyes    Intravitreal Injection, Pharmacologic Agent - OS - Left Eye    Bevacizumab (AVASTIN) SOLN 1.25 mg    2. Retinal hole of right eye  H33.321     3. Essential hypertension  I10     4. Hypertensive retinopathy of both eyes  H35.033     5. Combined forms of age-related cataract of both eyes  H25.813     6. Dry eyes  H04.123       1. Moderate non-proliferative diabetic retinopathy, OU - s/p IVA OS #1 (05.07.21), #2 (06.04.21), #3 (07.06.21), #4 (08.03.21), #5 (08.31.21), #6 (9.28.21), #7 (11.2.21), #8 (12.07.21), #9 (02.02.22), #10 (03.30.22), #11 (05.25.22), #12 (08.03.22), #13 (10.20.22), #14 (01.12.23), #15 (01.22.24), #16 (03.11.24), #17 (03.11.24)  - s/p PRP OS (04.09.21) -- good laser changes  - s/p PRP OD (02.05.24) for peripheral vascular nonperfusion -- good early laser changes  - repeat FA (09.28.21) shows late leaking MA OU, significant capillary drop-out  - repeat FA (01.23.24) shows OD: Scattered patches of vascular non-perfusion; large area of vascular non perfusion temporally, no NV; OS: delayed filling; scattered patches of vascular non perfusion peripherally, staining of 360 laser, scattered leaking MA -- most prominent IT to fovea, no NV  - exam shows scattered MA/IRH/CWS OU -- improving   - BCVA OD 20/50, OS 20/30 -- both improved - OCT shows OD: mild diffuse retinal thinning -- stable; OS: Persistent cystic changes IT fovea and mac @ 6 weeks - recommend  IVA OS #18 today, 04.22.24 w/ f/u in 6 wks  - pt in agreement  - RBA of procedure discussed, questions answered - IVA informed  consent obtained and re-signed OS (01.22.24) - see procedure note  - f/u 6 wks -- DFE/OCT, possible injction  2. Operculated retinal hole w/ cuff of SRF / focal RD, right eye - operculated hole located at 1000 with partial pigment and +cuff of SRF / focal RD  - s/p retinopexy OD (03.17.21) -- good laser changes surrounding  - stable, monitor  3,4. Hypertensive retinopathy OU  - discussed importance of tight BP control  - monitor  5. Mixed form age related cataract OU - The symptoms of cataract, surgical options, and treatments and risks were discussed with patient.  - discussed diagnosis and progression  - under the expert management of Dr. Laruth Bouchard - clear from a retina standpoint to proceed with cataract surgery when both patient and surgeon are ready  6. Dry eyes OU  - recommend artificial tears and lubricating ointment as needed  Ophthalmic Meds Ordered this visit:  Meds ordered this encounter  Medications   Bevacizumab (AVASTIN) SOLN 1.25 mg     Return in about 6 weeks (around 05/25/2023) for NPDR OU, DFE, OCT, likely injection OS.  There are no Patient Instructions on file for this visit.  Explained the diagnoses, plan, and follow up with the patient and they expressed understanding.  Patient expressed understanding of the importance of proper follow up care.   This document serves as a record of services personally performed by Karie Chimera, MD, PhD. It was created on their behalf by Annalee Genta, COMT. The creation of this record is the provider's dictation and/or activities during the visit.  Electronically signed by: Annalee Genta, COMT 04/14/23 9:41 PM  Karie Chimera, M.D., Ph.D. Diseases & Surgery of the Retina and Vitreous [facility]  I have reviewed the above documentation for accuracy and completeness, and I agree with the above. Karie Chimera, M.D., Ph.D. 04/14/23 9:42 PM   Abbreviations: M myopia (nearsighted); A astigmatism;  H hyperopia (farsighted); P presbyopia; Mrx spectacle prescription;  CTL contact lenses; OD right eye; OS left eye; OU both eyes  XT exotropia; ET esotropia; PEK punctate epithelial keratitis; PEE punctate epithelial erosions; DES dry eye syndrome; MGD meibomian gland dysfunction; ATs artificial tears; PFAT's preservative free artificial tears; NSC nuclear sclerotic cataract; PSC posterior subcapsular cataract; ERM epi-retinal membrane; PVD posterior vitreous detachment; RD retinal detachment; DM diabetes mellitus; DR diabetic retinopathy; NPDR non-proliferative diabetic retinopathy; PDR proliferative diabetic retinopathy; CSME clinically significant macular edema; DME diabetic macular edema; dbh dot blot hemorrhages; CWS cotton wool spot; POAG primary open angle glaucoma; C/D cup-to-disc ratio; HVF humphrey visual field; GVF goldmann visual field; OCT optical coherence tomography; IOP intraocular pressure; BRVO Branch retinal vein occlusion; CRVO central retinal vein occlusion; CRAO central retinal artery occlusion; BRAO branch retinal artery occlusion; RT retinal tear; SB scleral buckle; PPV pars plana vitrectomy; VH Vitreous hemorrhage; PRP panretinal laser photocoagulation; IVK intravitreal kenalog; VMT vitreomacular traction; MH Macular hole;  NVD neovascularization of the disc; NVE neovascularization elsewhere; AREDS age related eye disease study; ARMD age related macular degeneration; POAG primary open angle glaucoma; EBMD epithelial/anterior basement membrane dystrophy; ACIOL anterior chamber intraocular lens; IOL intraocular lens; PCIOL posterior chamber intraocular lens; Phaco/IOL phacoemulsification with intraocular lens placement; PRK photorefractive keratectomy; LASIK laser assisted in situ keratomileusis; HTN hypertension; DM diabetes mellitus; COPD chronic obstructive pulmonary disease

## 2023-04-13 ENCOUNTER — Encounter (INDEPENDENT_AMBULATORY_CARE_PROVIDER_SITE_OTHER): Payer: Self-pay | Admitting: Ophthalmology

## 2023-04-13 ENCOUNTER — Ambulatory Visit (INDEPENDENT_AMBULATORY_CARE_PROVIDER_SITE_OTHER): Payer: Medicare HMO | Admitting: Ophthalmology

## 2023-04-13 DIAGNOSIS — H33321 Round hole, right eye: Secondary | ICD-10-CM | POA: Diagnosis not present

## 2023-04-13 DIAGNOSIS — I10 Essential (primary) hypertension: Secondary | ICD-10-CM | POA: Diagnosis not present

## 2023-04-13 DIAGNOSIS — H35033 Hypertensive retinopathy, bilateral: Secondary | ICD-10-CM

## 2023-04-13 DIAGNOSIS — E113313 Type 2 diabetes mellitus with moderate nonproliferative diabetic retinopathy with macular edema, bilateral: Secondary | ICD-10-CM

## 2023-04-13 DIAGNOSIS — H04123 Dry eye syndrome of bilateral lacrimal glands: Secondary | ICD-10-CM

## 2023-04-13 DIAGNOSIS — H25813 Combined forms of age-related cataract, bilateral: Secondary | ICD-10-CM

## 2023-04-13 MED ORDER — BEVACIZUMAB CHEMO INJECTION 1.25MG/0.05ML SYRINGE FOR KALEIDOSCOPE
1.2500 mg | INTRAVITREAL | Status: AC | PRN
Start: 2023-04-13 — End: 2023-04-13
  Administered 2023-04-13: 1.25 mg via INTRAVITREAL

## 2023-04-13 NOTE — Progress Notes (Signed)
[facility] Summary Report / Loop Recorder 

## 2023-04-28 ENCOUNTER — Other Ambulatory Visit: Payer: Self-pay

## 2023-04-28 MED ORDER — POTASSIUM CHLORIDE ER 20 MEQ PO TBCR: 1.0000 | EXTENDED_RELEASE_TABLET | Freq: Every day | ORAL | 3 refills | Status: DC

## 2023-04-30 ENCOUNTER — Ambulatory Visit
Admission: EM | Admit: 2023-04-30 | Discharge: 2023-04-30 | Disposition: A | Discharge status: Home / Self Care | Payer: Medicare HMO

## 2023-04-30 DIAGNOSIS — M7989 Other specified soft tissue disorders: Secondary | ICD-10-CM

## 2023-04-30 NOTE — Discharge Instructions (Signed)
Go to the emergency department as soon as you leave urgent care for further evaluation and management. 

## 2023-04-30 NOTE — ED Triage Notes (Signed)
Pt has a open wound to LLE for the past 2 days.  States when he gets too much fluid on his legs he gets a blister and it bursts. Large open wound with redness noted.

## 2023-04-30 NOTE — ED Provider Notes (Signed)
[facility]    CSN: [id number] Arrival date & time: 04/30/23  1322      History   Chief Complaint Chief Complaint  Patient presents with   Wound Check    HPI [name] is a 72 y.o. male.   Patient presents for evaluation of swelling and erythema to the left lower leg.  Patient presents with his son who helps provide history.  They report this is an intermittent issue due to leg swelling.  They state that his leg swells up, blister forms, then it pops and it looks like this.  They deny any associated fever or injury to the leg.  Denies numbness or tingling.  Reports that family medicine doctor typically prescribes mupirocin ointment and an oral antibiotic and it resolves.  Patient reports that it has never been this severe before but son reports that he has seen similar appearance in the past.  The symptoms started a few days prior.   Wound Check    Past Medical History:  Diagnosis Date   Acute cerebrovascular accident (CVA) (HCC) 01/20/2020   Benign essential HTN 01/20/2020   Cataract    Mixed form OU   Diabetic retinopathy (HCC)    NPDR OU   Hypertensive retinopathy    OU   Seizures (HCC)    per pt- "none since teenage years"    Patient Active Problem List   Diagnosis Date Noted   Memory change 11/20/2020   Acute cerebrovascular accident (CVA) (HCC) 01/20/2020   Benign essential HTN 01/20/2020   Left homonymous hemianopsia 12/23/2019    Past Surgical History:  Procedure Laterality Date   LOOP RECORDER INSERTION N/A 01/23/2020   Procedure: LOOP RECORDER INSERTION;  Surgeon: Duke Salvia, MD;  Location: [facility] INVASIVE CV LAB;  Service: Cardiovascular;  Laterality: N/A;       Home Medications    Prior to Admission medications   Medication Sig Start Date End Date Taking? Authorizing Provider  aspirin 325 MG EC tablet aspirin 325 mg tablet,delayed release 01/24/20   [provider]  atorvastatin (LIPITOR) 40 MG tablet Take 1 tablet (40  mg total) by mouth daily at 6 PM. 01/23/20   Noralee Stain, DO  carvedilol (COREG) 6.25 MG tablet Take 6.25 mg by mouth 2 (two) times daily with a meal.    [provider]  donepezil (ARICEPT) 10 MG tablet TAKE 1 TABLET AT BEDTIME 07/14/22   Ihor Austin, NP  furosemide (LASIX) 40 MG tablet Take 40 mg by mouth daily. 11/11/22   [provider]  hydrochlorothiazide (HYDRODIURIL) 25 MG tablet Take 25 mg by mouth daily. 03/18/20   [provider]  latanoprost (XALATAN) 0.005 % ophthalmic solution SMARTSIG:In Eye(s) 10/31/22   [provider]  losartan (COZAAR) 100 MG tablet Take 100 mg by mouth daily. 11/10/20   [provider]  memantine (NAMENDA) 10 MG tablet TAKE 1 TABLET TWICE DAILY 07/14/22   Ihor Austin, NP  metFORMIN (GLUCOPHAGE) 500 MG tablet Take by mouth 2 (two) times daily with a meal.    [provider]  Multiple Vitamins-Minerals (ONE-A-DAY MENS 50+ PO) Take 1 tablet by mouth daily.    [provider]  mupirocin ointment (BACTROBAN) 2 % as needed. 12/17/21   [provider]  PFIZER-BIONT COVID-19 VAC-TRIS SUSP injection  02/07/21   [provider]  PFIZER-BIONTECH COVID-19 VACC 30 MCG/0.3ML injection  01/17/21   [provider]  Polyvinyl Alcohol-Povidone (REFRESH OP) Place 1 drop into both eyes in the  morning and at bedtime.    [provider]  Potassium Chloride ER 20 MEQ TBCR Take 1 tablet (20 mEq total) by mouth daily. 04/28/23   Gaston Islam., NP  traZODone (DESYREL) 100 MG tablet Take 100 mg by mouth at bedtime. 03/09/23   [provider]    Family History Family History  Problem Relation Age of Onset   COPD Mother    Diabetes Mother    Dementia Father    Diabetes Father    Cancer Father        prostate and lung   Colon cancer Neg Hx    Esophageal cancer Neg Hx    Stomach cancer Neg Hx    Rectal cancer Neg Hx     Social History Social History   Tobacco Use    Smoking status: Never   Smokeless tobacco: Never  Vaping Use   Vaping Use: Never used  Substance Use Topics   Alcohol use: Never   Drug use: Never     Allergies   Shellfish allergy   Review of Systems Review of Systems Per HPI  Physical Exam Triage Vital Signs ED Triage Vitals  Enc Vitals Group     BP 04/30/23 1401 (!) 169/78     Pulse Rate 04/30/23 1401 70     Resp --      Temp 04/30/23 1401 98.1 F (36.7 C)     Temp Source 04/30/23 1401 Oral     SpO2 04/30/23 1401 99 %     Weight --      Height --      Head Circumference --      Peak Flow --      Pain Score 04/30/23 1400 0     Pain Loc --      Pain Edu? --      Excl. in GC? --    No data found.  Updated Vital Signs BP (!) 169/78 (BP Location: Left Arm)   Pulse 70   Temp 98.1 F (36.7 C) (Oral)   SpO2 99%   Visual Acuity Right Eye Distance:   Left Eye Distance:   Bilateral Distance:    Right Eye Near:   Left Eye Near:    Bilateral Near:     Physical Exam Constitutional:      General: He is not in acute distress.    Appearance: Normal appearance. He is not toxic-appearing or diaphoretic.  HENT:     Head: Normocephalic and atraumatic.  Eyes:     Extraocular Movements: Extraocular movements intact.     Conjunctiva/sclera: Conjunctivae normal.  Pulmonary:     Effort: Pulmonary effort is normal.  Skin:    Comments: Patient has nonpitting edema present throughout the left lower leg.  Patient has broken skin/erythema present to the anterior portion of the lower leg that wraps medially and laterally.  Appears consistent with broken blister possibly.  No swelling to foot.  Although, I am not able to palpate pedal pulses or evaluate capillary refill.  Neurological:     General: No focal deficit present.     Mental Status: He is alert and oriented to person, place, and time. Mental status is at baseline.  Psychiatric:        Mood and Affect: Mood normal.        Behavior: Behavior normal.         Thought Content: Thought content normal.        Judgment: Judgment normal.  UC Treatments / Results  Labs (all labs ordered are listed, but only abnormal results are displayed) Labs Reviewed - No data to display  EKG   Radiology No results found.  Procedures Procedures (including critical care time)  Medications Ordered in UC Medications - No data to display  Initial Impression / Assessment and Plan / UC Course  I have reviewed the triage vital signs and the nursing notes.  Pertinent labs & imaging results that were available during my care of the patient were reviewed by me and considered in my medical decision making (see chart for details).     Has significant amount of swelling and broken skin present to left lower leg.  I am very concerned given that I am not able to palpate pedal pulses and I am not sure patient's baseline.  I do think that patient needs higher level of care for more extensive evaluation at the emergency department.  Advised patient and son to go to the ER today for further evaluation and management and they were agreeable.  Vital signs stable at discharge. Agree with the patient's family member transporting him to the ER. Final Clinical Impressions(s) / UC Diagnoses   Final diagnoses:  Left leg swelling     Discharge Instructions      Go to the emergency department as soon as you leave urgent care for further evaluation and management.    ED Prescriptions   None    PDMP not reviewed this encounter.   Gustavus Bryant, [place] 04/30/23 [phone]

## 2023-05-01 ENCOUNTER — Other Ambulatory Visit: Payer: Self-pay

## 2023-05-01 ENCOUNTER — Emergency Department (HOSPITAL_COMMUNITY)
Admission: EM | Admit: 2023-05-01 | Discharge: 2023-05-01 | Disposition: A | Discharge status: Home / Self Care | Payer: Medicare HMO | Attending: Emergency Medicine | Admitting: Emergency Medicine

## 2023-05-01 ENCOUNTER — Encounter (HOSPITAL_COMMUNITY): Payer: Self-pay

## 2023-05-01 DIAGNOSIS — Z7984 Long term (current) use of oral hypoglycemic drugs: Secondary | ICD-10-CM | POA: Diagnosis not present

## 2023-05-01 DIAGNOSIS — M7989 Other specified soft tissue disorders: Secondary | ICD-10-CM | POA: Diagnosis not present

## 2023-05-01 DIAGNOSIS — F0390 Unspecified dementia without behavioral disturbance: Secondary | ICD-10-CM | POA: Diagnosis not present

## 2023-05-01 DIAGNOSIS — E119 Type 2 diabetes mellitus without complications: Secondary | ICD-10-CM | POA: Diagnosis not present

## 2023-05-01 DIAGNOSIS — Z7982 Long term (current) use of aspirin: Secondary | ICD-10-CM | POA: Diagnosis not present

## 2023-05-01 DIAGNOSIS — R600 Localized edema: Secondary | ICD-10-CM | POA: Diagnosis not present

## 2023-05-01 DIAGNOSIS — Z4800 Encounter for change or removal of nonsurgical wound dressing: Secondary | ICD-10-CM | POA: Diagnosis present

## 2023-05-01 DIAGNOSIS — Z5189 Encounter for other specified aftercare: Secondary | ICD-10-CM

## 2023-05-01 LAB — COMPREHENSIVE METABOLIC PANEL
ALT: 11 U/L (ref 0–44)
AST: 12 U/L — ABNORMAL LOW (ref 15–41)
Albumin: 3.7 g/dL (ref 3.5–5.0)
Alkaline Phosphatase: 38 U/L (ref 38–126)
Anion gap: 9 (ref 5–15)
BUN: 15 mg/dL (ref 8–23)
CO2: 26 mmol/L (ref 22–32)
Calcium: 9.2 mg/dL (ref 8.9–10.3)
Chloride: 101 mmol/L (ref 98–111)
Creatinine, Ser: 1.19 mg/dL (ref 0.61–1.24)
GFR, Estimated: >60 mL/min (ref >60)
Glucose, Bld: 100 mg/dL — ABNORMAL HIGH (ref 70–99)
Potassium: 3.6 mmol/L (ref 3.5–5.1)
Sodium: 136 mmol/L (ref 135–145)
Total Bilirubin: 0.8 mg/dL (ref 0.3–1.2)
Total Protein: 7.2 g/dL (ref 6.5–8.1)

## 2023-05-01 LAB — CBC WITH DIFFERENTIAL/PLATELET
Abs Immature Granulocytes: 0.03 10*3/uL (ref 0.00–0.07)
Basophils Absolute: 0 10*3/uL (ref 0.0–0.1)
Basophils Relative: 0 %
Eosinophils Absolute: 0.1 10*3/uL (ref 0.0–0.5)
Eosinophils Relative: 2 %
HCT: 35.5 % — ABNORMAL LOW (ref 39.0–52.0)
Hemoglobin: 11.6 g/dL — ABNORMAL LOW (ref 13.0–17.0)
Immature Granulocytes: 1 %
Lymphocytes Relative: 27 %
Lymphs Abs: 1.6 10*3/uL (ref 0.7–4.0)
MCH: 28.9 pg (ref 26.0–34.0)
MCHC: 32.7 g/dL (ref 30.0–36.0)
MCV: 88.3 fL (ref 80.0–100.0)
Monocytes Absolute: 0.6 10*3/uL (ref 0.1–1.0)
Monocytes Relative: 10 %
Neutro Abs: 3.7 10*3/uL (ref 1.7–7.7)
Neutrophils Relative %: 60 %
Platelets: 211 10*3/uL (ref 150–400)
RBC: 4.02 MIL/uL — ABNORMAL LOW (ref 4.22–5.81)
RDW: 12.3 % (ref 11.5–15.5)
WBC: 6.1 10*3/uL (ref 4.0–10.5)
nRBC: 0 % (ref 0.0–0.2)

## 2023-05-01 LAB — CBG MONITORING, ED: Glucose-Capillary: 105 mg/dL — ABNORMAL HIGH (ref 70–99)

## 2023-05-01 MED ORDER — POTASSIUM CHLORIDE CRYS ER 20 MEQ PO TBCR
20.0000 meq | EXTENDED_RELEASE_TABLET | Freq: Once | ORAL | Status: AC
  Administered 2023-05-01: 20 meq via ORAL
  Filled 2023-05-01: qty 1

## 2023-05-01 MED ORDER — FUROSEMIDE 20 MG PO TABS
40.0000 mg | ORAL_TABLET | ORAL | Status: AC
  Administered 2023-05-01: 40 mg via ORAL
  Filled 2023-05-01: qty 2

## 2023-05-01 MED ORDER — POTASSIUM CHLORIDE ER 20 MEQ PO TBCR: EXTENDED_RELEASE_TABLET | ORAL | 0 refills | Status: DC

## 2023-05-01 MED ORDER — FUROSEMIDE 40 MG PO TABS: ORAL_TABLET | ORAL | 0 refills | Status: DC

## 2023-05-01 MED ORDER — BACITRACIN ZINC 500 UNIT/GM EX OINT: TOPICAL_OINTMENT | Freq: Two times a day (BID) | CUTANEOUS | Status: DC

## 2023-05-01 MED ORDER — BACITRACIN ZINC 500 UNIT/GM EX OINT: 1.0000 | TOPICAL_OINTMENT | Freq: Two times a day (BID) | CUTANEOUS | 0 refills | Status: DC

## 2023-05-01 NOTE — ED Triage Notes (Signed)
Pt arrives via POV with son. Pt has dementia. Son reports he has a wound on left lower leg that has had some drainage and swelling for the past 2 days. Wound started out as a blister. CMS is intact distal to the wound.

## 2023-05-01 NOTE — Discharge Instructions (Signed)
You were seen for your wound in the emergency department.   At home, please change the dressing once daily.  Please make sure that the dressing is not wrapped too tight when you change it.  Use the antibiotic ointment we have prescribed you (bacitracin).  To treat your leg swelling please take the Lasix and potassium twice a day for the next 4 days and then return to your normal once a day dosing of these medications.  Check your MyChart online for the results of any tests that had not resulted by the time you left the emergency department.   Follow-up with your primary doctor in 2-3 days regarding your visit.  Follow-up with the wound care clinic listed in this packet.  They may call you as well about an appointment.  Return immediately to the emergency department if you experience any of the following: Severe leg pain, fevers, or any other concerning symptoms.    Thank you for visiting our Emergency Department. It was a pleasure taking care of you today.

## 2023-05-01 NOTE — ED Provider Notes (Signed)
[place] EMERGENCY DEPARTMENT AT [facility] Provider Note   CSN: [id number] Arrival date & time: 05/01/23  1336     History {Add pertinent medical, surgical, social history, OB history to HPI:1} Chief Complaint  Patient presents with   Wound Check    [name] is a 72 y.o. male.  72 year old male with a history of lower extremity edema, dementia, diabetes, and stroke who presents to the emergency department with left lower extremity wound.  History provided by the patient's daughters who live with the patient.  States that he will occasionally get a wound of his left lower extremity which is believed to be from chronic lower extremity edema.  Says that they noticed 1 yesterday that was in place for an unknown duration of time.  Patient is unsure of how long it has been there.  No significant left leg pain.  Does have bilateral lower extremity swelling and takes Lasix and reports compliance with this medication.  Went to urgent care and they were unable to feel a pulse in his foot and saw the wound and referred him to the emergency department yesterday but they decided to come in today because of long wait times yesterday.  No fevers no significant pain of the leg.  Does not follow with wound care currently.       Home Medications Prior to Admission medications   Medication Sig Start Date End Date Taking? Authorizing Provider  aspirin 325 MG EC tablet aspirin 325 mg tablet,delayed release 01/24/20   [provider]  atorvastatin (LIPITOR) 40 MG tablet Take 1 tablet (40 mg total) by mouth daily at 6 PM. 01/23/20   Noralee Stain, DO  carvedilol (COREG) 6.25 MG tablet Take 6.25 mg by mouth 2 (two) times daily with a meal.    [provider]  donepezil (ARICEPT) 10 MG tablet TAKE 1 TABLET AT BEDTIME 07/14/22   Ihor Austin, NP  furosemide (LASIX) 40 MG tablet Take 40 mg by mouth daily. 11/11/22   [provider]  hydrochlorothiazide (HYDRODIURIL)  25 MG tablet Take 25 mg by mouth daily. 03/18/20   [provider]  latanoprost (XALATAN) 0.005 % ophthalmic solution SMARTSIG:In Eye(s) 10/31/22   [provider]  losartan (COZAAR) 100 MG tablet Take 100 mg by mouth daily. 11/10/20   [provider]  memantine (NAMENDA) 10 MG tablet TAKE 1 TABLET TWICE DAILY 07/14/22   Ihor Austin, NP  metFORMIN (GLUCOPHAGE) 500 MG tablet Take by mouth 2 (two) times daily with a meal.    [provider]  Multiple Vitamins-Minerals (ONE-A-DAY MENS 50+ PO) Take 1 tablet by mouth daily.    [provider]  mupirocin ointment (BACTROBAN) 2 % as needed. 12/17/21   [provider]  PFIZER-BIONT COVID-19 VAC-TRIS SUSP injection  02/07/21   [provider]  PFIZER-BIONTECH COVID-19 VACC 30 MCG/0.3ML injection  01/17/21   [provider]  Polyvinyl Alcohol-Povidone (REFRESH OP) Place 1 drop into both eyes in the morning and at bedtime.    [provider]  Potassium Chloride ER 20 MEQ TBCR Take 1 tablet (20 mEq total) by mouth daily. 04/28/23   Gaston Islam., NP  traZODone (DESYREL) 100 MG tablet Take 100 mg by mouth at bedtime. 03/09/23   [provider]      Allergies    Shellfish allergy    Review of Systems   Review of Systems  Physical Exam Updated Vital Signs BP (!) 165/84   Pulse 71  Temp 98.6 F (37 C)   Resp 20   SpO2 99%  Physical Exam Vitals and nursing note reviewed.  Constitutional:      General: He is not in acute distress.    Appearance: He is well-developed.  HENT:     Head: Normocephalic and atraumatic.     Right Ear: External ear normal.     Left Ear: External ear normal.     Nose: Nose normal.  Eyes:     Extraocular Movements: Extraocular movements intact.     Conjunctiva/sclera: Conjunctivae normal.     Pupils: Pupils are equal, round, and reactive to light.  Pulmonary:     Effort: Pulmonary effort is normal. No respiratory distress.   Abdominal:     Palpations: Abdomen is soft.  Musculoskeletal:     Cervical back: Normal range of motion and neck supple.     Right lower leg: Edema present.     Left lower leg: Edema present.     Comments: No significant erythema or warmth of the left lower extremity.  No crepitance palpated.  No tenderness to palpation past the edges of the wound.  No discharge noted.  See below for images.  Skin:    General: Skin is warm and dry.  Neurological:     Mental Status: He is alert. Mental status is at baseline.  Psychiatric:        Mood and Affect: Mood normal.        Behavior: Behavior normal.    LLE wound    ED Results / Procedures / Treatments   Labs (all labs ordered are listed, but only abnormal results are displayed) Labs Reviewed  CBC WITH DIFFERENTIAL/PLATELET - Abnormal; Notable for the following components:      Result Value   RBC 4.02 (*)    Hemoglobin 11.6 (*)    HCT 35.5 (*)    All other components within normal limits  COMPREHENSIVE METABOLIC PANEL - Abnormal; Notable for the following components:   Glucose, Bld 100 (*)    AST 12 (*)    All other components within normal limits  CBG MONITORING, ED - Abnormal; Notable for the following components:   Glucose-Capillary 105 (*)    All other components within normal limits    EKG None  Radiology No results found.  Procedures Procedures   Medications Ordered in ED Medications - No data to display  ED Course/ Medical Decision Making/ A&P                             Medical Decision Making Amount and/or Complexity of Data Reviewed Labs: ordered.  Risk OTC drugs. Prescription drug management.   [name] is a 72 y.o. male with comorbidities that complicate the patient evaluation including lower extremity edema, dementia, diabetes, and stroke who presents to the emergency department with left lower extremity wound.    Initial Ddx:  ***   MDM:  ***  Plan:  ***  ED  Summary/Re-evaluation:  ***  This patient presents to the ED for concern of complaints listed in HPI, this involves an extensive number of treatment options, and is a complaint that carries with it a high risk of complications and morbidity. Disposition including potential need for admission considered.   Dispo: {Disposition:28069}  Additional history obtained from {Additional History:28067} Records reviewed {Records Reviewed:28068} The following labs were independently interpreted: {labs interpreted:28064} and show {lab findings:28250} I independently reviewed the following imaging with  scope of interpretation limited to determining acute life threatening conditions related to emergency care: {imaging interpreted:28065} and agree with the radiologist interpretation with the following exceptions: none I personally reviewed and interpreted cardiac monitoring: {cardiac monitoring:28251} I personally reviewed and interpreted the pt's EKG: see above for interpretation  I have reviewed the patients home medications and made adjustments as needed Consults: {Consultants:28063} Social Determinants of health:  ***   {Document critical care time when appropriate:1} {Document review of labs and clinical decision tools ie heart score, Chads2Vasc2 etc:1}  {Document your independent review of radiology images, and any outside records:1} {Document your discussion with family members, caretakers, and with consultants:1} {Document social determinants of health affecting pt's care:1} {Document your decision making why or why not admission, treatments were needed:1} Final Clinical Impression(s) / ED Diagnoses Final diagnoses:  None    Rx / DC Orders ED Discharge Orders     None

## 2023-05-05 ENCOUNTER — Encounter (HOSPITAL_BASED_OUTPATIENT_CLINIC_OR_DEPARTMENT_OTHER): Payer: Medicare HMO | Attending: Internal Medicine | Admitting: Internal Medicine

## 2023-05-05 DIAGNOSIS — I890 Lymphedema, not elsewhere classified: Secondary | ICD-10-CM | POA: Insufficient documentation

## 2023-05-05 DIAGNOSIS — S80822A Blister (nonthermal), left lower leg, initial encounter: Secondary | ICD-10-CM | POA: Diagnosis present

## 2023-05-05 DIAGNOSIS — E11622 Type 2 diabetes mellitus with other skin ulcer: Secondary | ICD-10-CM | POA: Diagnosis not present

## 2023-05-05 DIAGNOSIS — I110 Hypertensive heart disease with heart failure: Secondary | ICD-10-CM | POA: Insufficient documentation

## 2023-05-05 DIAGNOSIS — I87312 Chronic venous hypertension (idiopathic) with ulcer of left lower extremity: Secondary | ICD-10-CM | POA: Insufficient documentation

## 2023-05-05 DIAGNOSIS — X58XXXA Exposure to other specified factors, initial encounter: Secondary | ICD-10-CM | POA: Insufficient documentation

## 2023-05-05 DIAGNOSIS — S81802A Unspecified open wound, left lower leg, initial encounter: Secondary | ICD-10-CM | POA: Insufficient documentation

## 2023-05-05 DIAGNOSIS — Z8673 Personal history of transient ischemic attack (TIA), and cerebral infarction without residual deficits: Secondary | ICD-10-CM | POA: Diagnosis not present

## 2023-05-05 DIAGNOSIS — L97829 Non-pressure chronic ulcer of other part of left lower leg with unspecified severity: Secondary | ICD-10-CM | POA: Insufficient documentation

## 2023-05-05 DIAGNOSIS — I5042 Chronic combined systolic (congestive) and diastolic (congestive) heart failure: Secondary | ICD-10-CM | POA: Diagnosis not present

## 2023-05-06 ENCOUNTER — Other Ambulatory Visit: Payer: Self-pay

## 2023-05-06 ENCOUNTER — Emergency Department (HOSPITAL_COMMUNITY)
Admission: EM | Admit: 2023-05-06 | Discharge: 2023-05-07 | Disposition: A | Discharge status: Home / Self Care | Payer: Medicare HMO | Attending: Student | Admitting: Student

## 2023-05-06 ENCOUNTER — Encounter (HOSPITAL_COMMUNITY): Payer: Self-pay

## 2023-05-06 DIAGNOSIS — I110 Hypertensive heart disease with heart failure: Secondary | ICD-10-CM | POA: Diagnosis not present

## 2023-05-06 DIAGNOSIS — N179 Acute kidney failure, unspecified: Secondary | ICD-10-CM | POA: Insufficient documentation

## 2023-05-06 DIAGNOSIS — F0390 Unspecified dementia without behavioral disturbance: Secondary | ICD-10-CM | POA: Diagnosis not present

## 2023-05-06 DIAGNOSIS — E860 Dehydration: Secondary | ICD-10-CM | POA: Insufficient documentation

## 2023-05-06 DIAGNOSIS — Z79899 Other long term (current) drug therapy: Secondary | ICD-10-CM | POA: Diagnosis not present

## 2023-05-06 DIAGNOSIS — E119 Type 2 diabetes mellitus without complications: Secondary | ICD-10-CM | POA: Diagnosis not present

## 2023-05-06 DIAGNOSIS — I509 Heart failure, unspecified: Secondary | ICD-10-CM | POA: Diagnosis not present

## 2023-05-06 DIAGNOSIS — Z7982 Long term (current) use of aspirin: Secondary | ICD-10-CM | POA: Insufficient documentation

## 2023-05-06 DIAGNOSIS — Z7984 Long term (current) use of oral hypoglycemic drugs: Secondary | ICD-10-CM | POA: Insufficient documentation

## 2023-05-06 DIAGNOSIS — R55 Syncope and collapse: Secondary | ICD-10-CM | POA: Insufficient documentation

## 2023-05-06 HISTORY — DX: Unspecified dementia, unspecified severity, without behavioral disturbance, psychotic disturbance, mood disturbance, and anxiety: F03.90

## 2023-05-06 NOTE — ED Triage Notes (Signed)
Pt from home BIB EMS after having a syncopal episode at the kitchen table, pt fell and hit his head on a plastic self, came too a few seconds later, pt appear at baseline, hx of dementia. Pt does not remember LOC. VSS per EMS, CBG 130, negative stroke screen. Pt currently being treated for a wound to LLE, some drainage and welling noted. Pt alert on arrival, answer questions and following commands.

## 2023-05-07 ENCOUNTER — Emergency Department (HOSPITAL_COMMUNITY): Payer: Medicare HMO

## 2023-05-07 LAB — COMPREHENSIVE METABOLIC PANEL
ALT: 18 U/L (ref 0–44)
AST: 22 U/L (ref 15–41)
Albumin: 3.3 g/dL — ABNORMAL LOW (ref 3.5–5.0)
Alkaline Phosphatase: 37 U/L — ABNORMAL LOW (ref 38–126)
Anion gap: 10 (ref 5–15)
BUN: 23 mg/dL (ref 8–23)
CO2: 30 mmol/L (ref 22–32)
Calcium: 9.1 mg/dL (ref 8.9–10.3)
Chloride: 99 mmol/L (ref 98–111)
Creatinine, Ser: 1.74 mg/dL — ABNORMAL HIGH (ref 0.61–1.24)
GFR, Estimated: 42 mL/min — ABNORMAL LOW (ref >60)
Glucose, Bld: 112 mg/dL — ABNORMAL HIGH (ref 70–99)
Potassium: 4.1 mmol/L (ref 3.5–5.1)
Sodium: 139 mmol/L (ref 135–145)
Total Bilirubin: 1 mg/dL (ref 0.3–1.2)
Total Protein: 6.7 g/dL (ref 6.5–8.1)

## 2023-05-07 LAB — URINALYSIS, ROUTINE W REFLEX MICROSCOPIC
Bilirubin Urine: NEGATIVE
Glucose, UA: NEGATIVE mg/dL
Ketones, ur: NEGATIVE mg/dL
Leukocytes,Ua: NEGATIVE
Nitrite: NEGATIVE
Protein, ur: NEGATIVE mg/dL
Specific Gravity, Urine: 1.006 (ref 1.005–1.030)
pH: 5 (ref 5.0–8.0)

## 2023-05-07 LAB — CBC WITH DIFFERENTIAL/PLATELET
Abs Immature Granulocytes: 0.03 10*3/uL (ref 0.00–0.07)
Basophils Absolute: 0 10*3/uL (ref 0.0–0.1)
Basophils Relative: 1 %
Eosinophils Absolute: 0.2 10*3/uL (ref 0.0–0.5)
Eosinophils Relative: 3 %
HCT: 33.3 % — ABNORMAL LOW (ref 39.0–52.0)
Hemoglobin: 11 g/dL — ABNORMAL LOW (ref 13.0–17.0)
Immature Granulocytes: 1 %
Lymphocytes Relative: 33 %
Lymphs Abs: 1.8 10*3/uL (ref 0.7–4.0)
MCH: 29.2 pg (ref 26.0–34.0)
MCHC: 33 g/dL (ref 30.0–36.0)
MCV: 88.3 fL (ref 80.0–100.0)
Monocytes Absolute: 0.7 10*3/uL (ref 0.1–1.0)
Monocytes Relative: 13 %
Neutro Abs: 2.6 10*3/uL (ref 1.7–7.7)
Neutrophils Relative %: 49 %
Platelets: 266 10*3/uL (ref 150–400)
RBC: 3.77 MIL/uL — ABNORMAL LOW (ref 4.22–5.81)
RDW: 12.3 % (ref 11.5–15.5)
WBC: 5.3 10*3/uL (ref 4.0–10.5)
nRBC: 0 % (ref 0.0–0.2)

## 2023-05-07 LAB — TROPONIN I (HIGH SENSITIVITY): Troponin I (High Sensitivity): 12 ng/L (ref <18)

## 2023-05-07 MED ORDER — LACTATED RINGERS IV BOLUS
500.0000 mL | Freq: Once | INTRAVENOUS | Status: AC
  Administered 2023-05-07: 500 mL via INTRAVENOUS

## 2023-05-07 NOTE — ED Notes (Signed)
Pt to CT scanner at this time after return from [facility], NAD noted. Steady gait was observed with walker.

## 2023-05-07 NOTE — ED Notes (Signed)
Pt's pacemaker was interrogated, [employer] rep [employer] called to advised NO EPISODES IN THE PAST 15 DAYS. Report to be faxed, Dr. Posey Rea notified.

## 2023-05-07 NOTE — ED Provider Notes (Signed)
[place] EMERGENCY DEPARTMENT AT [facility] Provider Note  CSN: [id number] Arrival date & time: 05/06/23 2321  Chief Complaint(s) Loss of Consciousness  HPI [name] is a 72 y.o. male with PMH previous cryptogenic stroke status post loop recorder placement in 2021, T2DM, CHF, HTN who presents emergency room for evaluation of a syncopal episode.  Patient recently seen in the emergency room for evaluation of a venous stasis dermatitis wound and instructed to double his Lasix for the next 4 days.  Patient has been on a double dose of his Lasix for at least the last 48 hours and today had a syncopal episode while sitting at his dinner table.  Patient fell and struck his head on a plastic bin on the ground.  No postictal period and patient returned to normal mental status baseline immediately.  No shaking noted. Patient was found to be hypotensive with diastolic blood pressures in the 30s by EMS.  Blood pressures 147/64 on arrival.  Here in the emergency room, patient denies chest pain, shortness of breath, abdominal pain, nausea, vomiting or any other systemic symptoms.  Past Medical History Past Medical History:  Diagnosis Date   Acute cerebrovascular accident (CVA) (HCC) 01/20/2020   Benign essential HTN 01/20/2020   Cataract    Mixed form OU   Dementia (HCC)    Diabetic retinopathy (HCC)    NPDR OU   Hypertensive retinopathy    OU   Seizures (HCC)    per pt- "none since teenage years"   Patient Active Problem List   Diagnosis Date Noted   Memory change 11/20/2020   Acute cerebrovascular accident (CVA) (HCC) 01/20/2020   Benign essential HTN 01/20/2020   Left homonymous hemianopsia 12/23/2019   Home Medication(s) Prior to Admission medications   Medication Sig Start Date End Date Taking? Authorizing Provider  aspirin 325 MG EC tablet aspirin 325 mg tablet,delayed release 01/24/20   [provider]  atorvastatin (LIPITOR) 40 MG tablet Take 1 tablet (40 mg  total) by mouth daily at 6 PM. 01/23/20   Noralee Stain, DO  bacitracin ointment Apply 1 Application topically 2 (two) times daily. 05/01/23   Rondel Baton, MD  carvedilol (COREG) 6.25 MG tablet Take 6.25 mg by mouth 2 (two) times daily with a meal.    [provider]  donepezil (ARICEPT) 10 MG tablet TAKE 1 TABLET AT BEDTIME 07/14/22   Ihor Austin, NP  furosemide (LASIX) 40 MG tablet Take 1 tablet (40 mg total) by mouth 2 (two) times daily for 4 days, THEN 1 tablet (40 mg total) daily. 05/01/23 06/04/23  Rondel Baton, MD  hydrochlorothiazide (HYDRODIURIL) 25 MG tablet Take 25 mg by mouth daily. 03/18/20   [provider]  latanoprost (XALATAN) 0.005 % ophthalmic solution SMARTSIG:In Eye(s) 10/31/22   [provider]  losartan (COZAAR) 100 MG tablet Take 100 mg by mouth daily. 11/10/20   [provider]  memantine (NAMENDA) 10 MG tablet TAKE 1 TABLET TWICE DAILY 07/14/22   Ihor Austin, NP  metFORMIN (GLUCOPHAGE) 500 MG tablet Take by mouth 2 (two) times daily with a meal.    [provider]  Multiple Vitamins-Minerals (ONE-A-DAY MENS 50+ PO) Take 1 tablet by mouth daily.    [provider]  mupirocin ointment (BACTROBAN) 2 % as needed. 12/17/21   [provider]  PFIZER-BIONT COVID-19 VAC-TRIS SUSP injection  02/07/21   [provider]  PFIZER-BIONTECH COVID-19 VACC 30 MCG/0.3ML injection  01/17/21   [provider]  Polyvinyl Alcohol-Povidone (REFRESH OP) Place 1 drop into both eyes in the morning and at bedtime.    [provider]  Potassium Chloride ER 20 MEQ TBCR Take 1 tablet (20 mEq total) by mouth in the morning and at bedtime for 4 days, THEN 1 tablet (20 mEq total) daily. 05/01/23 06/04/23  Rondel Baton, MD  traZODone (DESYREL) 100 MG tablet Take 100 mg by mouth at bedtime. 03/09/23   [provider]                                                                                                                                     Past Surgical History Past Surgical History:  Procedure Laterality Date   LOOP RECORDER INSERTION N/A 01/23/2020   Procedure: LOOP RECORDER INSERTION;  Surgeon: Duke Salvia, MD;  Location: [facility] INVASIVE CV LAB;  Service: Cardiovascular;  Laterality: N/A;   Family History Family History  Problem Relation Age of Onset   COPD Mother    Diabetes Mother    Dementia Father    Diabetes Father    Cancer Father        prostate and lung   Colon cancer Neg Hx    Esophageal cancer Neg Hx    Stomach cancer Neg Hx    Rectal cancer Neg Hx     Social History Social History   Tobacco Use   Smoking status: Never   Smokeless tobacco: Never  Vaping Use   Vaping Use: Never used  Substance Use Topics   Alcohol use: Never   Drug use: Never   Allergies Shellfish allergy  Review of Systems Review of Systems  Neurological:  Positive for syncope.    Physical Exam Vital Signs  I have reviewed the triage vital signs BP (!) 131/58 (BP Location: Right Arm)   Pulse 61   Temp 98.7 F (37.1 C) (Oral)   Resp 13   Ht 5\' 7"  (1.702 m)   Wt 87.5 kg   SpO2 100%   BMI 30.21 kg/m   Physical Exam Constitutional:      General: He is not in acute distress.    Appearance: Normal appearance.  HENT:     Head: Normocephalic and atraumatic.     Nose: No congestion or rhinorrhea.  Eyes:     General:        Right eye: No discharge.        Left eye: No discharge.     Extraocular Movements: Extraocular movements intact.     Pupils: Pupils are equal, round, and reactive to light.  Cardiovascular:     Rate and Rhythm: Normal rate and regular rhythm.     Heart sounds: No murmur heard. Pulmonary:     Effort: No respiratory distress.     Breath sounds: No wheezing or rales.  Abdominal:     General: There is no distension.     Tenderness: There is  no abdominal tenderness.  Musculoskeletal:        General: Normal range of motion.     Cervical back:  Normal range of motion.  Skin:    General: Skin is warm and dry.  Neurological:     General: No focal deficit present.     Mental Status: He is alert.     ED Results and Treatments Labs (all labs ordered are listed, but only abnormal results are displayed) Labs Reviewed  COMPREHENSIVE METABOLIC PANEL  CBC WITH DIFFERENTIAL/PLATELET  URINALYSIS, ROUTINE W REFLEX MICROSCOPIC  TROPONIN I (HIGH SENSITIVITY)                                                                                                                          Radiology No results found.  Pertinent labs & imaging results that were available during my care of the patient were reviewed by me and considered in my medical decision making (see MDM for details).  Medications Ordered in ED Medications - No data to display                                                                                                                                   Procedures Procedures  (including critical care time)  Medical Decision Making / ED Course   This patient presents to the ED for concern of syncope, this involves an extensive number of treatment options, and is a complaint that carries with it a high risk of complications and morbidity.  The differential diagnosis includes orthostatic syncope, vasovagal syncope, dehydration, cardiogenic syncope, seizure  MDM: Patient seen emergency room for evaluation of a syncopal episode.  Physical exam is unremarkable.  Laboratory evaluation with a new mild AKI with creatinine 1.74 up from a baseline of 1.19.  Hemoglobin 11.0 which is not far from baseline.  Urinalysis without evidence of infection.  High-sensitivity opponent is negative.  ECG nonischemic with no evidence of WPW or Brugada.  CT head and C-spine unremarkable.  Patient's loop recorder successfully interrogated with no acute events around his syncopal event.  Patient gently fluid resuscitated and orthostatic vital signs were  normal.  Suspect overdiuresis, dehydration and orthostatic syncope.  Patient was instructed to return to his normal Lasix dosing and will follow-up outpatient.  Patient then discharged with outpatient follow-up.   Additional history obtained: -Additional history obtained from multiple family numbers -External records from outside source  obtained and reviewed including: Chart review including previous notes, labs, imaging, consultation notes   Lab Tests: -I ordered, reviewed, and interpreted labs.   The pertinent results include:   Labs Reviewed  COMPREHENSIVE METABOLIC PANEL  CBC WITH DIFFERENTIAL/PLATELET  URINALYSIS, ROUTINE W REFLEX MICROSCOPIC  TROPONIN I (HIGH SENSITIVITY)      EKG   EKG Interpretation  Date/Time:  Wednesday May 06 2023 23:34:05 EDT Ventricular Rate:  58 PR Interval:  206 QRS Duration: 93 QT Interval:  396 QTC Calculation: 389 R Axis:   27 Text Interpretation: Sinus rhythm Confirmed by Caide Campi ([id number]) on 05/07/2023 12:08:05 AM         Imaging Studies ordered: I ordered imaging studies including CT head, C-spine I independently visualized and interpreted imaging. I agree with the radiologist interpretation   Medicines ordered and prescription drug management: No orders of the defined types were placed in this encounter.   -I have reviewed the patients home medicines and have made adjustments as needed  Critical interventions none    Cardiac Monitoring: The patient was maintained on a cardiac monitor.  I personally viewed and interpreted the cardiac monitored which showed an underlying rhythm of: NSR  Social Determinants of Health:  Factors impacting patients care include: none   Reevaluation: After the interventions noted above, I reevaluated the patient and found that they have :improved  Co morbidities that complicate the patient evaluation  Past Medical History:  Diagnosis Date   Acute cerebrovascular accident (CVA) (HCC)  01/20/2020   Benign essential HTN 01/20/2020   Cataract    Mixed form OU   Dementia (HCC)    Diabetic retinopathy (HCC)    NPDR OU   Hypertensive retinopathy    OU   Seizures (HCC)    per pt- "none since teenage years"      Dispostion: I considered admission for this patient, but he does not meet inpatient criteria for admission he is safe for discharge with outpatient follow-up     Final Clinical Impression(s) / ED Diagnoses Final diagnoses:  None     @PCDICTATION @    Glendora Score, MD 05/07/23 1704

## 2023-05-11 ENCOUNTER — Ambulatory Visit (INDEPENDENT_AMBULATORY_CARE_PROVIDER_SITE_OTHER): Payer: Medicare HMO

## 2023-05-11 DIAGNOSIS — I639 Cerebral infarction, unspecified: Secondary | ICD-10-CM

## 2023-05-11 LAB — CUP PACEART REMOTE DEVICE CHECK
Date Time Interrogation Session: 20240517230807
Implantable Pulse Generator Implant Date: 20210201

## 2023-05-12 ENCOUNTER — Encounter (HOSPITAL_BASED_OUTPATIENT_CLINIC_OR_DEPARTMENT_OTHER): Payer: Medicare HMO | Admitting: Internal Medicine

## 2023-05-12 DIAGNOSIS — E11622 Type 2 diabetes mellitus with other skin ulcer: Secondary | ICD-10-CM | POA: Diagnosis not present

## 2023-05-12 DIAGNOSIS — S81802A Unspecified open wound, left lower leg, initial encounter: Secondary | ICD-10-CM | POA: Diagnosis not present

## 2023-05-12 DIAGNOSIS — I890 Lymphedema, not elsewhere classified: Secondary | ICD-10-CM

## 2023-05-12 DIAGNOSIS — I87312 Chronic venous hypertension (idiopathic) with ulcer of left lower extremity: Secondary | ICD-10-CM

## 2023-05-12 NOTE — Progress Notes (Signed)
[facility] Summary Report / Loop Recorder 

## 2023-05-20 NOTE — Progress Notes (Signed)
Office Visit    Patient Name: [name] Date of Encounter: 05/20/2023  Primary Care Provider:  Raymon Mutton., FNP Primary Cardiologist:  Dietrich Pates, MD Primary Electrophysiologist: None   Past Medical History    Past Medical History:  Diagnosis Date   Acute cerebrovascular accident (CVA) (HCC) 01/20/2020   Benign essential HTN 01/20/2020   Cataract    Mixed form OU   Dementia (HCC)    Diabetic retinopathy (HCC)    NPDR OU   Hypertensive retinopathy    OU   Seizures (HCC)    per pt- "none since teenage years"   Past Surgical History:  Procedure Laterality Date   LOOP RECORDER INSERTION N/A 01/23/2020   Procedure: LOOP RECORDER INSERTION;  Surgeon: Duke Salvia, MD;  Location: [facility] INVASIVE CV LAB;  Service: Cardiovascular;  Laterality: N/A;    Allergies  Allergies  Allergen Reactions   Shellfish Allergy Anaphylaxis     History of Present Illness    [name] is a 72 y.o. male with PMH of CVA s/p right MCA infarct 12/2019 found to be cryptogenic ILR placed, DM type II, HTN, chronic combined systolic and diastolic CHF who presents today for follow-up of recent ED visit for hypotension and syncope.  [name] was initially seen by Dr.Klein in 01/2020 following a cryptogenic stroke that required implantable loop recorder that was implanted 01/23/2020.  He has some residual left hemisphere and mild cognitive impairment from his subacute large right MCA stroke.  2D echo was completed 12/2019 showing reduced EF of 45-50% and increased LVH and LV global hypokinesis.  He was seen in follow-up on 11/24/2022 with shortness of breath and increased lower extremity swelling.  The patient's Lasix was increased to 40 mg twice daily x 3 days and 2D echo was completed that revealed recovered EF of 60 to 65% with trivial MV regurgitation and mild to moderate AV regurgitation with mild dilation of ascending aorta measuring 43 mm.  There were no additional medication changes  made at his visit.  He was seen in follow-up on 01/20/2023 and was doing well with no new cardiac complaints.  His blood pressure was found to be well-controlled.  He was admitted 05/07/2023 with report of loss of consciousness.  He had previously been seen for venous stasis and dermatitis and was directed to increase his Lasix x 4 days.  Patient reportedly fell and struck his head on a plastic bin on the ground.  EKG was normal with no evidence of WPW or Brugada's.  CT of the head and C-spine were also unremarkable.  Patient's loop recorder was interrogated and showed no evidence of acute events during syncopal episode.  Patient was advised to return to normal dosing of Lasix and was discharged in stable condition.   [name] presents today with his son for post ED follow-up.  Since last being seen in the office patient reports that he has been doing well with no recurrence of dizziness or presyncope since his ED visit.  His blood pressure today is well-controlled at 112/60 and heart rate was 53 bpm.  Prior to today's visit he had taken his home medications.  He denies any adverse reactions with his current medication regimen.  In discussing his current state of health with his son he has been staying hydrated with Gatorade and watching his water intake.  His weights have been stable and patient is euvolemic on examination today.  Patient was also encouraged to abstain from excess  salt in his current diet.  He is currently being treated by wound clinic due to left lower leg wound that is healing well.  He was encouraged to contact our office if he has recurrence of our feels episodes of dizziness are fainting.  Patient denies chest pain, palpitations, dyspnea, PND, orthopnea, nausea, vomiting, dizziness, syncope, edema, weight gain, or early satiety.  Home Medications    Current Outpatient Medications  Medication Sig Dispense Refill   aspirin 325 MG EC tablet aspirin 325 mg tablet,delayed release      atorvastatin (LIPITOR) 40 MG tablet Take 1 tablet (40 mg total) by mouth daily at 6 PM. 30 tablet 2   bacitracin ointment Apply 1 Application topically 2 (two) times daily. 120 g 0   carvedilol (COREG) 6.25 MG tablet Take 6.25 mg by mouth 2 (two) times daily with a meal.     donepezil (ARICEPT) 10 MG tablet TAKE 1 TABLET AT BEDTIME 90 tablet 3   furosemide (LASIX) 40 MG tablet Take 1 tablet (40 mg total) by mouth 2 (two) times daily for 4 days, THEN 1 tablet (40 mg total) daily. 30 tablet 0   hydrochlorothiazide (HYDRODIURIL) 25 MG tablet Take 25 mg by mouth daily.     latanoprost (XALATAN) 0.005 % ophthalmic solution SMARTSIG:In Eye(s)     losartan (COZAAR) 100 MG tablet Take 100 mg by mouth daily.     memantine (NAMENDA) 10 MG tablet TAKE 1 TABLET TWICE DAILY 180 tablet 3   metFORMIN (GLUCOPHAGE) 500 MG tablet Take by mouth 2 (two) times daily with a meal.     Multiple Vitamins-Minerals (ONE-A-DAY MENS 50+ PO) Take 1 tablet by mouth daily.     mupirocin ointment (BACTROBAN) 2 % as needed.     PFIZER-BIONT COVID-19 VAC-TRIS SUSP injection      PFIZER-BIONTECH COVID-19 VACC 30 MCG/0.3ML injection      Polyvinyl Alcohol-Povidone (REFRESH OP) Place 1 drop into both eyes in the morning and at bedtime.     Potassium Chloride ER 20 MEQ TBCR Take 1 tablet (20 mEq total) by mouth in the morning and at bedtime for 4 days, THEN 1 tablet (20 mEq total) daily. 38 tablet 0   traZODone (DESYREL) 100 MG tablet Take 100 mg by mouth at bedtime.     Current Facility-Administered Medications  Medication Dose Route Frequency Provider Last Rate Last Admin   0.9 %  sodium chloride infusion  500 mL Intravenous Once Armbruster, Willaim Rayas, MD       0.9 %  sodium chloride infusion  500 mL Intravenous Once Armbruster, Willaim Rayas, MD         Review of Systems  Please see the history of present illness.    (+) Healing wound on left leg (+) Memory deficits  All other systems reviewed and are otherwise negative except  as noted above.  Physical Exam    Wt Readings from Last 3 Encounters:  05/06/23 192 lb 14.4 oz (87.5 kg)  03/17/23 193 lb (87.5 kg)  01/20/23 201 lb 6.4 oz (91.4 kg)   JX:BJYNW were no vitals filed for this visit.,There is no height or weight on file to calculate BMI.  Constitutional:      Appearance: Healthy appearance. Not in distress.  Neck:     Vascular: JVD normal.  Pulmonary:     Effort: Pulmonary effort is normal.     Breath sounds: No wheezing. No rales. Diminished in the bases Cardiovascular:     Normal rate. Regular rhythm.  Normal S1. Normal S2.      Murmurs: There is no murmur.  Edema:    Patient has trace lower extremity edema present with healing wound on left lower leg Abdominal:     Palpations: Abdomen is soft non tender. There is no hepatomegaly.  Skin:    General: Skin is warm and dry.  Neurological:     General: No focal deficit present.     Mental Status: Alert and oriented to person, place and time.     Cranial Nerves: Cranial nerves are intact.  EKG/LABS/ Recent Cardiac Studies    ECG personally reviewed by me today -none completed today  Cardiac Studies & Procedures       ECHOCARDIOGRAM  ECHOCARDIOGRAM COMPLETE 01/13/2023  Narrative ECHOCARDIOGRAM REPORT    Patient Name:   Klint Kaaihue Date of Exam: 01/13/2023 Medical Rec #:  [id number]        Height:       67.0 in Accession #:    [id number]       Weight:       202.5 lb Date of Birth:  July 03, 1951       BSA:          2.033 m Patient Age:    71 years         BP:           128/64 mmHg Patient Gender: M                HR:           68 bpm. Exam Location:  [address]  Procedure: 2D Echo, 3D Echo, Cardiac Doppler and Color Doppler  Indications:    I50.40 CHF  History:        Patient has prior history of Echocardiogram examinations, most recent 01/21/2020. CHF, Stroke, Signs/Symptoms:Edema; Risk Factors:Hypertension, Diabetes and Dyslipidemia. CHF (prior EF 45-50%).  Sonographer:     Farrel Conners RDCS Referring Phys: Ames Coupe Moesha Sarchet  IMPRESSIONS   1. Left ventricular ejection fraction, by estimation, is 60 to 65%. Left ventricular ejection fraction by 3D volume is 62 %. The left ventricle has normal function. The left ventricle has no regional wall motion abnormalities. There is mild left ventricular hypertrophy. Left ventricular diastolic parameters were grossly normal. 2. Right ventricular systolic function is normal. The right ventricular size is normal. There is normal pulmonary artery systolic pressure. The estimated right ventricular systolic pressure is 30.7 mmHg. 3. Left atrial size was mildly dilated. 4. The mitral valve is grossly normal. Trivial mitral valve regurgitation. No evidence of mitral stenosis. 5. The aortic valve is tricuspid. There is mild calcification of the aortic valve. Aortic valve regurgitation is mild to moderate. No aortic stenosis is present. 6. Aortic dilatation noted. There is mild dilatation of the ascending aorta, measuring 43 mm. 7. The inferior vena cava is normal in size with greater than 50% respiratory variability, suggesting right atrial pressure of 3 mmHg.  FINDINGS Left Ventricle: Left ventricular ejection fraction, by estimation, is 60 to 65%. Left ventricular ejection fraction by 3D volume is 62 %. The left ventricle has normal function. The left ventricle has no regional wall motion abnormalities. The left ventricular internal cavity size was normal in size. There is mild left ventricular hypertrophy. Left ventricular diastolic parameters were normal.  Right Ventricle: The right ventricular size is normal. No increase in right ventricular wall thickness. Right ventricular systolic function is normal. There is normal pulmonary artery systolic pressure. The tricuspid regurgitant velocity is 2.63  m/s, and with an assumed right atrial pressure of 3 mmHg, the estimated right ventricular systolic pressure is 30.7 mmHg.  Left  Atrium: Left atrial size was mildly dilated.  Right Atrium: Right atrial size was normal in size.  Pericardium: Trivial pericardial effusion is present.  Mitral Valve: The mitral valve is grossly normal. Trivial mitral valve regurgitation. No evidence of mitral valve stenosis.  Tricuspid Valve: The tricuspid valve is normal in structure. Tricuspid valve regurgitation is mild . No evidence of tricuspid stenosis.  Aortic Valve: The aortic valve is tricuspid. There is mild calcification of the aortic valve. Aortic valve regurgitation is mild to moderate. Aortic regurgitation PHT measures 514 msec. No aortic stenosis is present.  Pulmonic Valve: The pulmonic valve was normal in structure. Pulmonic valve regurgitation is trivial. No evidence of pulmonic stenosis.  Aorta: Aortic dilatation noted. There is mild dilatation of the ascending aorta, measuring 43 mm.  Venous: The inferior vena cava is normal in size with greater than 50% respiratory variability, suggesting right atrial pressure of 3 mmHg.  IAS/Shunts: No atrial level shunt detected by color flow Doppler.   LEFT VENTRICLE PLAX 2D LVIDd:         4.60 cm         Diastology LVIDs:         2.40 cm         LV e' medial:    8.38 cm/s LV PW:         0.90 cm         LV E/e' medial:  9.0 LV IVS:        1.20 cm         LV e' lateral:   12.00 cm/s LVOT diam:     2.30 cm         LV E/e' lateral: 6.3 LV SV:         92 LV SV Index:   45 LVOT Area:     4.15 cm        3D Volume EF LV 3D EF:    Left ventricul ar ejection fraction by 3D volume is 62 %.  3D Volume EF: 3D EF:        62 % LV EDV:       144 ml LV ESV:       54 ml LV SV:        89 ml  RIGHT VENTRICLE RV Basal diam:  4.20 cm RV Mid diam:    3.40 cm RV S prime:     11.90 cm/s TAPSE (M-mode): 2.2 cm  LEFT ATRIUM             Index        RIGHT ATRIUM           Index LA diam:        4.40 cm 2.16 cm/m   RA Area:     21.30 cm LA Vol (A2C):   82.7 ml 40.68 ml/m  RA  Volume:   65.90 ml  32.42 ml/m LA Vol (A4C):   97.9 ml 48.16 ml/m LA Biplane Vol: 90.8 ml 44.66 ml/m AORTIC VALVE LVOT Vmax:   105.50 cm/s LVOT Vmean:  66.800 cm/s LVOT VTI:    0.222 m AI PHT:      514 msec  AORTA Ao Root diam: 3.50 cm Ao Asc diam:  4.25 cm  MITRAL VALVE               TRICUSPID VALVE MV  Area (PHT)  cm         TR Peak grad:   27.7 mmHg MV Decel Time: 229 msec    TR Vmax:        263.00 cm/s MV E velocity: 75.50 cm/s MV A velocity: 82.30 cm/s  SHUNTS MV E/A ratio:  0.92        Systemic VTI:  0.22 m Systemic Diam: 2.30 cm  Weston Brass MD Electronically signed by Weston Brass MD Signature Date/Time: 01/13/2023/10:10:17 AM    Final              Lab Results  Component Value Date   WBC 5.3 05/06/2023   HGB 11.0 (L) 05/06/2023   HCT 33.3 (L) 05/06/2023   MCV 88.3 05/06/2023   PLT 266 05/06/2023   Lab Results  Component Value Date   CREATININE 1.74 (H) 05/06/2023   BUN 23 05/06/2023   NA 139 05/06/2023   K 4.1 05/06/2023   CL 99 05/06/2023   CO2 30 05/06/2023   Lab Results  Component Value Date   ALT 18 05/06/2023   AST 22 05/06/2023   ALKPHOS 37 (L) 05/06/2023   BILITOT 1.0 05/06/2023   Lab Results  Component Value Date   CHOL 119 02/24/2023   HDL 46 02/24/2023   LDLCALC 62 02/24/2023   TRIG 46 02/24/2023   CHOLHDL 2.6 02/24/2023    Lab Results  Component Value Date   HGBA1C 5.6 11/20/2020     Assessment & Plan    1.  Syncope and collapse: -Patient recently admitted on 05/07/2023 with complaint of syncope and collapse. -Today patient reports that he has not experienced any further dizziness or syncope. -He was advised to stay hydrated and no changes will be made to his current diuretic regimen.  2.Chronic combined systolic and diastolic CHF: -Updated 2D echo was completed on 01/13/2023 showing improved EF of 60 to 65% with trivial MV regurgitation and mild to moderate AV regurgitation with mild dilation of ascending aorta  measuring 43 mm. -Patient is euvolemic on examination today. -Continue Lasix 40 mg daily, carvedilol 6.25 mg twice daily, potassium 20 mEq daily and losartan 100 mg daily -Low sodium diet, fluid restriction <2L, and daily weights encouraged. Educated to contact our office for weight gain of 2 lbs overnight or 5 lbs in one week.   3.  Hyperlipidemia: -Patient's last LDL was 51 2 years ago. -We will recheck lipids and LFTs once patient is fasting. -Continue Lipitor 40 mg daily   4.  Essential hypertension: -Patient's blood pressure today was well-controlled at 112/60 -Continue losartan 100 mg daily and carvedilol 6.25 mg twice daily   5.  Lower extremity edema: -Patient is currently being treated for left lower extremity skin wound. -He has trace lower extremity edema present today. -Patient's shortness of breath has also improved and he was advised to continue Lasix 40 mg daily and potassium 20 mEq daily   6.  Mild aortic root dilation: -Patient's most recent 2D echo showed aortic root dilation of 43 mmHg -Results reviewed with patient and advised to maintain good BP control we -We will repeat surveillance MRI to evaluate for possible progression.   Disposition: Follow-up with Dietrich Pates, MD or APP in 6 months    Medication Adjustments/Labs and Tests Ordered: Current medicines are reviewed at length with the patient today.  Concerns regarding medicines are outlined above.   Signed, Napoleon Form, Leodis Rains, NP 05/20/2023, 7:10 PM [facility]

## 2023-05-21 ENCOUNTER — Encounter (HOSPITAL_BASED_OUTPATIENT_CLINIC_OR_DEPARTMENT_OTHER): Payer: Medicare HMO | Admitting: Internal Medicine

## 2023-05-21 DIAGNOSIS — I87312 Chronic venous hypertension (idiopathic) with ulcer of left lower extremity: Secondary | ICD-10-CM | POA: Diagnosis not present

## 2023-05-21 DIAGNOSIS — I890 Lymphedema, not elsewhere classified: Secondary | ICD-10-CM | POA: Diagnosis not present

## 2023-05-21 DIAGNOSIS — S81802A Unspecified open wound, left lower leg, initial encounter: Secondary | ICD-10-CM

## 2023-05-21 DIAGNOSIS — E11622 Type 2 diabetes mellitus with other skin ulcer: Secondary | ICD-10-CM | POA: Diagnosis not present

## 2023-05-21 NOTE — Progress Notes (Signed)
[facility] - Clinic Note  05/25/2023     CHIEF COMPLAINT Patient presents for Retina Follow Up   HISTORY OF PRESENT ILLNESS: [name] is a 72 y.o. male who presents to the clinic today for:   HPI     Retina Follow Up   Patient presents with  Diabetic Retinopathy.  In both eyes.  This started 6 weeks ago.  I, the attending physician,  performed the HPI with the patient and updated documentation appropriately.        Comments   Patient here for 6 weeks retina follow up for NPDR OU. Patient states vision pretty good. Able to walk dog three times a day. No eye pain.       Last edited by Rennis Chris, MD on 05/25/2023 12:15 PM.    Patient feels that the vision is improving. He did better reading the eye chart.   Referring physician: Raymon Mutton., FNP [address] [place],  [place] [address]  HISTORICAL INFORMATION:   Selected notes from the MEDICAL RECORD NUMBER Referred by Dr. Fabian Sharp for concern of HTN Ret   CURRENT MEDICATIONS: Current Outpatient Medications (Ophthalmic Drugs)  Medication Sig   latanoprost (XALATAN) 0.005 % ophthalmic solution SMARTSIG:In Eye(s)   Polyvinyl Alcohol-Povidone (REFRESH OP) Place 1 drop into both eyes in the morning and at bedtime.   No current facility-administered medications for this visit. (Ophthalmic Drugs)   Current Outpatient Medications (Other)  Medication Sig   aspirin 325 MG EC tablet aspirin 325 mg tablet,delayed release   atorvastatin (LIPITOR) 40 MG tablet Take 1 tablet (40 mg total) by mouth daily at 6 PM.   bacitracin ointment Apply 1 Application topically 2 (two) times daily.   carvedilol (COREG) 6.25 MG tablet Take 6.25 mg by mouth 2 (two) times daily with a meal.   donepezil (ARICEPT) 10 MG tablet TAKE 1 TABLET AT BEDTIME   furosemide (LASIX) 40 MG tablet Take 1 tablet (40 mg total) by mouth 2 (two) times daily for 4 days, THEN 1 tablet (40 mg total) daily.   hydrochlorothiazide  (HYDRODIURIL) 25 MG tablet Take 25 mg by mouth daily.   losartan (COZAAR) 100 MG tablet Take 100 mg by mouth daily.   memantine (NAMENDA) 10 MG tablet TAKE 1 TABLET TWICE DAILY   metFORMIN (GLUCOPHAGE) 500 MG tablet Take by mouth 2 (two) times daily with a meal.   Multiple Vitamins-Minerals (ONE-A-DAY MENS 50+ PO) Take 1 tablet by mouth daily.   mupirocin ointment (BACTROBAN) 2 % as needed.   PFIZER-BIONT COVID-19 VAC-TRIS SUSP injection    PFIZER-BIONTECH COVID-19 VACC 30 MCG/0.3ML injection    Potassium Chloride ER 20 MEQ TBCR Take 1 tablet (20 mEq total) by mouth in the morning and at bedtime for 4 days, THEN 1 tablet (20 mEq total) daily.   traZODone (DESYREL) 100 MG tablet Take 100 mg by mouth at bedtime.   Current Facility-Administered Medications (Other)  Medication Route   0.9 %  sodium chloride infusion Intravenous   0.9 %  sodium chloride infusion Intravenous   REVIEW OF SYSTEMS: ROS   Positive for: Endocrine, Eyes Negative for: Constitutional, Gastrointestinal, Neurological, Skin, Genitourinary, Musculoskeletal, HENT, Cardiovascular, Respiratory, Psychiatric, Allergic/Imm, Heme/Lymph Last edited by Laddie Aquas, COA on 05/25/2023  9:54 AM.       ALLERGIES Allergies  Allergen Reactions   Shellfish Allergy Anaphylaxis   PAST MEDICAL HISTORY Past Medical History:  Diagnosis Date   Acute cerebrovascular accident (CVA) (HCC) 01/20/2020  Benign essential HTN 01/20/2020   Cataract    Mixed form OU   Dementia (HCC)    Diabetic retinopathy (HCC)    NPDR OU   Hypertensive retinopathy    OU   Seizures (HCC)    per pt- "none since teenage years"   Past Surgical History:  Procedure Laterality Date   LOOP RECORDER INSERTION N/A 01/23/2020   Procedure: LOOP RECORDER INSERTION;  Surgeon: Duke Salvia, MD;  Location[facility] INVASIVE CV LAB;  Service: Cardiovascular;  Laterality: N/A;   FAMILY HISTORY Family History  Problem Relation Age of Onset   COPD Mother     Diabetes Mother    Dementia Father    Diabetes Father    Cancer Father        prostate and lung   Colon cancer Neg Hx    Esophageal cancer Neg Hx    Stomach cancer Neg Hx    Rectal cancer Neg Hx    SOCIAL HISTORY Social History   Tobacco Use   Smoking status: Never   Smokeless tobacco: Never  Vaping Use   Vaping Use: Never used  Substance Use Topics   Alcohol use: Never   Drug use: Never       OPHTHALMIC EXAM: Base Eye Exam     Visual Acuity (Snellen - Linear)       Right Left   Dist [place] 20/40 20/40 +1   Dist ph [place] NI 20/25         Tonometry (Tonopen, 9:52 AM)       Right Left   Pressure 13 13         Pupils       Dark Light Shape React APD   Right 3 2 Round Brisk None   Left 3 2 Round Brisk None         Visual Fields       Left Right    Full Full         Extraocular Movement       Right Left    Full, Ortho Full, Ortho         Neuro/Psych     Oriented x3: Yes   Mood/Affect: Normal         Dilation     Both eyes: 1.0% Mydriacyl, 2.5% Phenylephrine @ 9:52 AM           Slit Lamp and Fundus Exam     Slit Lamp Exam       Right Left   Lids/Lashes Dermatochalasis - upper lid, mild MGD Dermatochalasis - upper lid   Conjunctiva/Sclera Nasal and temporal Pinguecula, , Melanosis Nasal and temporal Pinguecula, mild Melanosis   Cornea Arcus, 2-3+Punctate epithelial erosions Arcus, 2+ Punctate epithelial erosions with irregular epi inferiorly, mild debris in tear film   Anterior Chamber Deep and quiet Deep and quiet   Iris Round and dilated, No NVI Round and dilated, No NVI   Lens 3+ Nuclear sclerosis, 3+ Cortical cataract 3+ Nuclear sclerosis, 3+ Cortical cataract   Anterior Vitreous Vitreous syneresis Vitreous syneresis, Posterior vitreous detachment         Fundus Exam       Right Left   Disc sharp rim, mild pallor, +cupping Sharp, mild Pallor   C/D Ratio 0.6 0.5   Macula Flat, good foveal reflex, rare MA, scattered cystic  changes -- improved, No frank edema, focal DBH superiorly Flat, good foveal reflex, scattered Microaneurysms/DBH, trace cystic changes IT macula, focal blot heme IN fovea  Vessels attenuated, mild tortuosity attenuated, Tortuous   Periphery Attached, operculated hole at 1000 with partial pigment and +cuff of SRF -- good laser changes surrounding, scattered MA/DBH greatest posteriorly, White without pressure temporal periphery, good peripheral PRP changes 360 Attached, scattered DBH, focal pigmented CR scar at 0130 equator, good peripheral 360 PRP           IMAGING AND PROCEDURES  Imaging and Procedures for @TODAY @  OCT, Retina - OU - Both Eyes       Right Eye Quality was good. Central Foveal Thickness: 239. Progression has been stable. Findings include normal foveal contour, no IRF, no SRF (mild diffuse retinal thinning -- stable).   Left Eye Quality was borderline. Central Foveal Thickness: 242. Progression has improved. Findings include normal foveal contour, no SRF, intraretinal hyper-reflective material, intraretinal fluid (Persistent cystic changes IT fovea and mac -- slightly improved).   Notes *Images captured and stored on drive  Diagnosis / Impression:  OD: NFP, no IRF / SRF -- mild diffuse retinal thinning -- stable OS: NFP, no SRF, Persistent cystic changes IT fovea and mac -- slightly improved   Clinical management:  See below  Abbreviations: NFP - Normal foveal profile. CME - cystoid macular edema. PED - pigment epithelial detachment. IRF - intraretinal fluid. SRF - subretinal fluid. EZ - ellipsoid zone. ERM - epiretinal membrane. ORA - outer retinal atrophy. ORT - outer retinal tubulation. SRHM - subretinal hyper-reflective material      Intravitreal Injection, Pharmacologic Agent - OS - Left Eye       Time Out 05/25/2023. 10:36 AM. Confirmed correct patient, procedure, site, and patient consented.   Anesthesia Topical anesthesia was used. Anesthetic  medications included Lidocaine 2%, Proparacaine 0.5%.   Procedure Preparation included 5% betadine to ocular surface, eyelid speculum. A (32g) needle was used.   Injection: 1.25 mg Bevacizumab 1.25mg /0.44ml   Route: Intravitreal, Site: Left Eye   NDC: [id number], Lot: [id number], Expiration date: 08/22/2023   Post-op Post injection exam found visual acuity of at least counting fingers. The patient tolerated the procedure well. There were no complications. The patient received written and verbal post procedure care education.            ASSESSMENT/PLAN:    ICD-10-CM   1. Moderate nonproliferative diabetic retinopathy of both eyes with macular edema associated with type 2 diabetes mellitus (HCC)  E11.3313 OCT, Retina - OU - Both Eyes    Intravitreal Injection, Pharmacologic Agent - OS - Left Eye    Bevacizumab (AVASTIN) SOLN 1.25 mg    2. Retinal hole of right eye  H33.321     3. Essential hypertension  I10     4. Hypertensive retinopathy of both eyes  H35.033     5. Combined forms of age-related cataract of both eyes  H25.813     6. Dry eyes  H04.123      1. Moderate non-proliferative diabetic retinopathy, OU - s/p IVA OS #1 (05.07.21), #2 (06.04.21), #3 (07.06.21), #4 (08.03.21), #5 (08.31.21), #6 (9.28.21), #7 (11.2.21), #8 (12.07.21), #9 (02.02.22), #10 (03.30.22), #11 (05.25.22), #12 (08.03.22), #13 (10.20.22), #14 (01.12.23), #15 (01.22.24), #16 (03.11.24), #17 (03.11.24), #18 (04.22.24)  - s/p PRP OS (04.09.21) -- good laser changes - s/p PRP OD (02.05.24) for peripheral vascular nonperfusion -- good early laser changes  - repeat FA (09.28.21) shows late leaking MA OU, significant capillary drop-out  - repeat FA (01.23.24) shows OD: Scattered patches of vascular non-perfusion; large area of vascular non perfusion temporally, no NV; OS:  delayed filling; scattered patches of vascular non perfusion peripherally, staining of 360 laser, scattered leaking MA -- most prominent IT  to fovea, no NV  - exam shows scattered MA/IRH/CWS OU -- improving   - BCVA OD 20/40, OS 20/25 -- both improved - OCT shows OD: mild diffuse retinal thinning -- stable; OS: Persistent cystic changes IT fovea and mac -- slightly improved @ 6 weeks - recommend IVA OS #19 today, 06.03.24 w/ f/u in 6 wks  - pt in agreement  - RBA of procedure discussed, questions answered - IVA informed consent obtained and re-signed OS (01.22.24) - see procedure note  - f/u 6 wks -- DFE/OCT, possible injction  2. Operculated retinal hole w/ cuff of SRF / focal RD, right eye - operculated hole located at 1000 with partial pigment and +cuff of SRF / focal RD  - s/p retinopexy OD (03.17.21) -- good laser changes surrounding  - stable, monitor  3,4. Hypertensive retinopathy OU  - discussed importance of tight BP control  - monitor  5. Mixed form age related cataract OU - The symptoms of cataract, surgical options, and treatments and risks were discussed with patient.  - discussed diagnosis and progression  - under the expert management of Dr. Laruth Bouchard - clear from a retina standpoint to proceed with cataract surgery when both patient and surgeon are ready  6. Dry eyes OU  - recommend artificial tears and lubricating ointment as needed  Ophthalmic Meds Ordered this visit:  Meds ordered this encounter  Medications   Bevacizumab (AVASTIN) SOLN 1.25 mg     Return in about 6 weeks (around 07/06/2023) for f/u Mod NPDR OU, DFE, OCT, Possible, IVA, OS.  There are no Patient Instructions on file for this visit.  Explained the diagnoses, plan, and follow up with the patient and they expressed understanding.  Patient expressed understanding of the importance of proper follow up care.   This document serves as a record of services personally performed by Karie Chimera, MD, PhD. It was created on their behalf by Annalee Genta, COMT. The creation of this record is the provider's dictation and/or activities during  the visit.  Electronically signed by: Annalee Genta, COMT 05/25/23 12:16 PM  This document serves as a record of services personally performed by Karie Chimera, MD, PhD. It was created on their behalf by Gerilyn Nestle, COT an ophthalmic technician. The creation of this record is the provider's dictation and/or activities during the visit.    Electronically signed by:  Gerilyn Nestle, COT  6.3.24 12:16 PM   Karie Chimera, M.D., Ph.D. Diseases & Surgery of the Retina and Vitreous [facility]  I have reviewed the above documentation for accuracy and completeness, and I agree with the above. Karie Chimera, M.D., Ph.D. 05/25/23 12:16 PM   Abbreviations: M myopia (nearsighted); A astigmatism; H hyperopia (farsighted); P presbyopia; Mrx spectacle prescription;  CTL contact lenses; OD right eye; OS left eye; OU both eyes  XT exotropia; ET esotropia; PEK punctate epithelial keratitis; PEE punctate epithelial erosions; DES dry eye syndrome; MGD meibomian gland dysfunction; ATs artificial tears; PFAT's preservative free artificial tears; NSC nuclear sclerotic cataract; PSC posterior subcapsular cataract; ERM epi-retinal membrane; PVD posterior vitreous detachment; RD retinal detachment; DM diabetes mellitus; DR diabetic retinopathy; NPDR non-proliferative diabetic retinopathy; PDR proliferative diabetic retinopathy; CSME clinically significant macular edema; DME diabetic macular edema; dbh dot blot hemorrhages; CWS cotton wool spot; POAG primary open angle glaucoma; C/D cup-to-disc ratio;  HVF humphrey visual field; GVF goldmann visual field; OCT optical coherence tomography; IOP intraocular pressure; BRVO Branch retinal vein occlusion; CRVO central retinal vein occlusion; CRAO central retinal artery occlusion; BRAO branch retinal artery occlusion; RT retinal tear; SB scleral buckle; PPV pars plana vitrectomy; VH Vitreous hemorrhage; PRP panretinal laser photocoagulation;  IVK intravitreal kenalog; VMT vitreomacular traction; MH Macular hole;  NVD neovascularization of the disc; NVE neovascularization elsewhere; AREDS age related eye disease study; ARMD age related macular degeneration; POAG primary open angle glaucoma; EBMD epithelial/anterior basement membrane dystrophy; ACIOL anterior chamber intraocular lens; IOL intraocular lens; PCIOL posterior chamber intraocular lens; Phaco/IOL phacoemulsification with intraocular lens placement; PRK photorefractive keratectomy; LASIK laser assisted in situ keratomileusis; HTN hypertension; DM diabetes mellitus; COPD chronic obstructive pulmonary disease

## 2023-05-22 ENCOUNTER — Ambulatory Visit: Payer: Medicare HMO | Attending: Nurse Practitioner | Admitting: Nurse Practitioner

## 2023-05-22 ENCOUNTER — Encounter: Payer: Self-pay | Admitting: Nurse Practitioner

## 2023-05-22 VITALS — BP 112/60 | HR 53 | Ht 67.0 in | Wt 178.2 lb

## 2023-05-22 DIAGNOSIS — R600 Localized edema: Secondary | ICD-10-CM | POA: Diagnosis not present

## 2023-05-22 DIAGNOSIS — I10 Essential (primary) hypertension: Secondary | ICD-10-CM | POA: Diagnosis not present

## 2023-05-22 DIAGNOSIS — R55 Syncope and collapse: Secondary | ICD-10-CM | POA: Diagnosis not present

## 2023-05-22 DIAGNOSIS — I77810 Thoracic aortic ectasia: Secondary | ICD-10-CM

## 2023-05-22 DIAGNOSIS — E785 Hyperlipidemia, unspecified: Secondary | ICD-10-CM

## 2023-05-22 NOTE — Patient Instructions (Signed)
Medication Instructions:  Your physician recommends that you continue on your current medications as directed. Please refer to the Current Medication list given to you today. *If you need a refill on your cardiac medications before your next appointment, please call your pharmacy*   Lab Work: None ordered If you have labs (blood work) drawn today and your tests are completely normal, you will receive your results only by: MyChart Message (if you have MyChart) OR A paper copy in the mail If you have any lab test that is abnormal or we need to change your treatment, we will call you to review the results.   Testing/Procedures: None ordered   Follow-Up: At [facility], you and your health needs are our priority.  As part of our continuing mission to provide you with exceptional heart care, we have created designated Provider Care Teams.  These Care Teams include your primary Cardiologist (physician) and Advanced Practice Providers (APPs -  Physician Assistants and Nurse Practitioners) who all work together to provide you with the care you need, when you need it.  We recommend signing up for the patient portal called "MyChart".  Sign up information is provided on this After Visit Summary.  MyChart is used to connect with patients for Virtual Visits (Telemedicine).  Patients are able to view lab/test results, encounter notes, upcoming appointments, etc.  Non-urgent messages can be sent to your provider as well.   To learn more about what you can do with MyChart, go to [email or web address].    Your next appointment:   6 month(s)  Provider:   Dietrich Pates, MD     Other Instructions

## 2023-05-25 ENCOUNTER — Encounter (INDEPENDENT_AMBULATORY_CARE_PROVIDER_SITE_OTHER): Payer: Self-pay | Admitting: Ophthalmology

## 2023-05-25 ENCOUNTER — Ambulatory Visit (INDEPENDENT_AMBULATORY_CARE_PROVIDER_SITE_OTHER): Payer: Medicare HMO | Admitting: Ophthalmology

## 2023-05-25 DIAGNOSIS — E113313 Type 2 diabetes mellitus with moderate nonproliferative diabetic retinopathy with macular edema, bilateral: Secondary | ICD-10-CM | POA: Diagnosis not present

## 2023-05-25 DIAGNOSIS — H35033 Hypertensive retinopathy, bilateral: Secondary | ICD-10-CM

## 2023-05-25 DIAGNOSIS — H04123 Dry eye syndrome of bilateral lacrimal glands: Secondary | ICD-10-CM

## 2023-05-25 DIAGNOSIS — I10 Essential (primary) hypertension: Secondary | ICD-10-CM | POA: Diagnosis not present

## 2023-05-25 DIAGNOSIS — H33321 Round hole, right eye: Secondary | ICD-10-CM | POA: Diagnosis not present

## 2023-05-25 DIAGNOSIS — H25813 Combined forms of age-related cataract, bilateral: Secondary | ICD-10-CM

## 2023-05-25 MED ORDER — BEVACIZUMAB CHEMO INJECTION 1.25MG/0.05ML SYRINGE FOR KALEIDOSCOPE
1.2500 mg | INTRAVITREAL | Status: AC | PRN
Start: 2023-05-25 — End: 2023-05-25
  Administered 2023-05-25: 1.25 mg via INTRAVITREAL

## 2023-05-29 ENCOUNTER — Encounter (HOSPITAL_BASED_OUTPATIENT_CLINIC_OR_DEPARTMENT_OTHER): Payer: Medicare HMO | Attending: Internal Medicine | Admitting: Internal Medicine

## 2023-05-29 DIAGNOSIS — X58XXXA Exposure to other specified factors, initial encounter: Secondary | ICD-10-CM | POA: Insufficient documentation

## 2023-05-29 DIAGNOSIS — G40909 Epilepsy, unspecified, not intractable, without status epilepticus: Secondary | ICD-10-CM | POA: Diagnosis not present

## 2023-05-29 DIAGNOSIS — I5042 Chronic combined systolic (congestive) and diastolic (congestive) heart failure: Secondary | ICD-10-CM | POA: Insufficient documentation

## 2023-05-29 DIAGNOSIS — S81802A Unspecified open wound, left lower leg, initial encounter: Secondary | ICD-10-CM | POA: Diagnosis present

## 2023-05-29 DIAGNOSIS — I87313 Chronic venous hypertension (idiopathic) with ulcer of bilateral lower extremity: Secondary | ICD-10-CM | POA: Diagnosis not present

## 2023-05-29 DIAGNOSIS — S81801A Unspecified open wound, right lower leg, initial encounter: Secondary | ICD-10-CM | POA: Diagnosis not present

## 2023-05-29 DIAGNOSIS — E11622 Type 2 diabetes mellitus with other skin ulcer: Secondary | ICD-10-CM | POA: Diagnosis not present

## 2023-05-29 DIAGNOSIS — Z8673 Personal history of transient ischemic attack (TIA), and cerebral infarction without residual deficits: Secondary | ICD-10-CM | POA: Diagnosis not present

## 2023-05-29 DIAGNOSIS — I890 Lymphedema, not elsewhere classified: Secondary | ICD-10-CM | POA: Diagnosis not present

## 2023-05-29 DIAGNOSIS — I110 Hypertensive heart disease with heart failure: Secondary | ICD-10-CM | POA: Insufficient documentation

## 2023-05-31 ENCOUNTER — Encounter: Payer: Self-pay | Admitting: Adult Health

## 2023-06-02 NOTE — Progress Notes (Signed)
Adell, Avner ([id number]) [id number] Page 1 of 4 Visit Report for 05/05/2023 Abuse Risk Screen Details Patient Name: Date of Service: [name], [name] 05/05/2023 8:00 A M Medical Record Number: [id number] Patient Account Number: [id number] Date of Birth/Sex: Treating RN: 12/12/1951 (72 y.o. [name] Primary Care Annalisa Colonna: [name] Other Clinician: Referring Fabianna Keats: Treating Fizza Scales/Extender: Ebony Cargo in Treatment: 0 Abuse Risk Screen Items Answer ABUSE RISK SCREEN: Has anyone close to you tried to hurt or harm you recentlyo No Do you feel uncomfortable with anyone in your familyo No Has anyone forced you do things that you didnt want to doo No Electronic Signature(s) Signed: 06/02/2023 7:49:05 AM By: Brenton Grills Entered By: Brenton Grills on 05/05/2023 08:14:46 -------------------------------------------------------------------------------- Activities of Daily Living Details Patient Name: Date of Service: [name], [name] 05/05/2023 8:00 A M Medical Record Number: [id number] Patient Account Number: [id number] Date of Birth/Sex: Treating RN: 17-Dec-1951 (72 y.o. [name] Primary Care Gurnoor Sloop: [name] Other Clinician: Referring Jonathan Corpus: Treating Abron Neddo/Extender: Ebony Cargo in Treatment: 0 Activities of Daily Living Items Answer Activities of Daily Living (Please select one for each item) Drive Automobile Not Able T Medications ake Completely Able Use T elephone Completely Able Care for Appearance Completely Able Use T oilet Completely Able Bath / Shower Completely Able Dress Self Completely Able Feed Self Completely Able Walk Completely Able Get In / Out Bed Completely Able Housework Completely Able Prepare Meals Completely Able Handle Money Completely Able Shop for Self Completely Able Electronic Signature(s) Signed: 06/02/2023 7:49:05 AM By:  Brenton Grills Entered By: Brenton Grills on 05/05/2023 08:15:35 -------------------------------------------------------------------------------- Education Screening Details Patient Name: Date of Service: [name], [name] 05/05/2023 8:00 A M Medical Record Number: [id number] Patient Account Number: [id number] Date of Birth/Sex: Treating RN: 22-Jun-1951 (72 y.o. [name] Primary Care Myca Perno: [name] Other Clinician: Referring Dade Rodin: Treating Halvor Behrend/Extender: Ebony Cargo in Treatment: 0 [place], Jacqlyn Krauss ([id number]) [id number] Page 2 of 4 Learning Preferences/Education Level/Primary Language Highest Education Level: High School Preferred Language: Economist Language Barrier: No Translator Needed: No Memory Deficit: No Emotional Barrier: No Cultural/Religious Beliefs Affecting Medical Care: No Physical Barrier Impaired Vision: No Impaired Hearing: No Decreased Hand dexterity: No Knowledge/Comprehension Knowledge Level: Medium Comprehension Level: Medium Ability to understand written instructions: Medium Ability to understand verbal instructions: Medium Motivation Anxiety Level: Calm Cooperation: Cooperative Education Importance: Acknowledges Need Interest in Health Problems: Asks Questions Perception: Coherent Willingness to Engage in Self-Management High Activities: Readiness to Engage in Self-Management High Activities: Electronic Signature(s) Signed: 06/02/2023 7:49:05 AM By: Brenton Grills Entered By: Brenton Grills on 05/05/2023 08:16:26 -------------------------------------------------------------------------------- Fall Risk Assessment Details Patient Name: Date of Service: [name] [name] 05/05/2023 8:00 A M Medical Record Number: [id number] Patient Account Number: [id number] Date of Birth/Sex: Treating RN: 1951-07-15 (72 y.o. [name] Primary Care Suhey Radford:  [name] Other Clinician: Referring Luane Rochon: Treating Auburn Hert/Extender: Ebony Cargo in Treatment: 0 Fall Risk Assessment Items Have you had 2 or more falls in the last 12 monthso 0 No Have you had any fall that resulted in injury in the last 12 monthso 0 No FALLS RISK SCREEN History of falling - immediate or within 3 months 0 No Secondary diagnosis (Do you have 2 or more medical diagnoseso) 0 No Ambulatory aid None/bed rest/wheelchair/nurse 0 No Crutches/cane/walker 0 No Furniture 0 No Intravenous therapy Access/Saline/Heparin Lock 0 No Gait/Transferring Normal/ bed rest/ wheelchair 0 No Weak (short steps with or without  shuffle, stooped but able to lift head while walking, may seek 0 No support from furniture) Impaired (short steps with shuffle, may have difficulty arising from chair, head down, impaired 0 No balance) Mental Status Oriented to own ability 0 No Electronic Signature(s) [name], [name] ([id number]) [id number] Page 3 of 4 Signed: 06/02/2023 7:49:05 AM By: Brenton Grills Entered By: Brenton Grills on 05/05/2023 08:16:52 -------------------------------------------------------------------------------- Foot Assessment Details Patient Name: Date of Service: [name], [name] 05/05/2023 8:00 A M Medical Record Number: [id number] Patient Account Number: [id number] Date of Birth/Sex: Treating RN: February 15, 1951 (72 y.o. [name] Primary Care Mycheal V[place]: [name] Other Clinician: Referring Leib Elahi: Treating Doni Bacha/Extender: Ebony Cargo in Treatment: 0 Foot Assessment Items Site Locations + = Sensation present, - = Sensation absent, C = Callus, U = Ulcer R = Redness, W = Warmth, M = Maceration, PU = Pre-ulcerative lesion F = Fissure, S = Swelling, D = Dryness Assessment Right: Left: Other Deformity: No No Prior Foot Ulcer: No No Prior Amputation: No No Charcot Joint: No  No Ambulatory Status: Ambulatory Without Help Gait: Steady Electronic Signature(s) Signed: 06/02/2023 7:49:05 AM By: Brenton Grills Entered By: Brenton Grills on 05/05/2023 08:26:21 -------------------------------------------------------------------------------- Nutrition Risk Screening Details Patient Name: Date of Service: [name], [name] 05/05/2023 8:00 A M Medical Record Number: [id number] Patient Account Number: [id number] Date of Birth/Sex: Treating RN: 01/08/1951 (71 y.o. [name] Primary Care Jahmiya Guidotti: [name] Other Clinician: Referring Kadesia Robel: Treating Mohd. Derflinger/Extender: Ebony Cargo in Treatment: 0 Height (in): 67 Weight (lbs): 193 Body Mass Index (BMI): 30.2 Ulibarri, Brace ([id number]) [id number] Page 4 of 4 Nutrition Risk Screening Items Score Screening NUTRITION RISK SCREEN: I have an illness or condition that made me change the kind and/or amount of food I eat 0 No I eat fewer than two meals per day 0 No I eat few fruits and vegetables, or milk products 0 No I have three or more drinks of beer, liquor or wine almost every day 0 No I have tooth or mouth problems that make it hard for me to eat 0 No I don't always have enough money to buy the food I need 0 No I eat alone most of the time 0 No I take three or more different prescribed or over-the-counter drugs a day 0 No Without wanting to, I have lost or gained 10 pounds in the last six months 0 No I am not always physically able to shop, cook and/or feed myself 0 No Nutrition Protocols Good Risk Protocol Moderate Risk Protocol High Risk Proctocol Risk Level: Good Risk Score: 0 Electronic Signature(s) Signed: 06/02/2023 7:49:05 AM By: Brenton Grills Entered By: Brenton Grills on 05/05/2023 08:17:00

## 2023-06-02 NOTE — Progress Notes (Signed)
[name] ([id number]) [id number] Page 1 of 6 Visit Report for 05/05/2023 Allergy List Details Patient Name: Date of Service: [name], [name] 05/05/2023 8:00 A M Medical Record Number: [id number] Patient Account Number: [id number] Date of Birth/Sex: Treating RN: 09-01-1951 (72 y.o. Yates Decamp Primary Care Ceirra Belli: [name] Other Clinician: Referring Eusevio Schriver: Treating Gertrude Tarbet/Extender: Loraine Maple Weeks in Treatment: 0 Allergies Active Allergies Shellfish Containing Products Severity: Severe Allergy Notes Electronic Signature(s) Signed: 06/02/2023 7:49:05 AM By: Brenton Grills Entered By: Brenton Grills on 05/05/2023 08:12:15 -------------------------------------------------------------------------------- Arrival Information Details Patient Name: Date of Service: [name], [name] 05/05/2023 8:00 A M Medical Record Number: [id number] Patient Account Number: [id number] Date of Birth/Sex: Treating RN: 08/15/1951 (72 y.o. Yates Decamp Primary Care Angelyna Henderson: [name] Other Clinician: Referring Jere Vanburen: Treating Nishka Heide/Extender: Ebony Cargo in Treatment: 0 Visit Information Patient Arrived: Ambulatory Arrival Time: 08:04 Accompanied By: son Transfer Assistance: None Patient Identification Verified: Yes Secondary Verification Process Completed: Yes Patient Requires Transmission-Based Precautions: No Patient Has Alerts: No History Since Last Visit All ordered tests and consults were completed: Yes Added or deleted any medications: No Any new allergies or adverse reactions: No Had a fall or experienced change in activities of daily living that may affect risk of falls: No Signs or symptoms of abuse/neglect since last visito No Hospitalized since last visit: No Implantable device outside of the clinic excluding cellular tissue based products placed in the center since last visit:  No Pain Present Now: No Electronic Signature(s) Signed: 06/02/2023 7:49:05 AM By: Brenton Grills Entered By: Brenton Grills on 05/05/2023 08:05:30 -------------------------------------------------------------------------------- Compression Therapy Details Patient Name: Date of Service: [name] 05/05/2023 8:00 A M Medical Record Number: [id number] Patient Account Number: [id number] Date of Birth/Sex: Treating RN: 1951-05-13 (72 y.o. Yates Decamp Primary Care [name]: [name] Other Clinician: Referring Kyheem Bathgate: Treating Krysta Bloomfield/Extender: Ebony Cargo in Treatment: 0 Compression Therapy Performed for Wound Assessment: Wound #1 Left,Anterior Lower Leg Performed By: Clinician Brenton Grills, RN Compression Type: Three Layer Pre Treatment ABI: 1.1 Nolden, Damiel ([id number]) [id number] Page 2 of 6 Post Procedure Diagnosis Same as Pre-procedure Electronic Signature(s) Signed: 06/02/2023 7:49:05 AM By: Brenton Grills Entered By: Brenton Grills on 05/05/2023 09:23:44 -------------------------------------------------------------------------------- Encounter Discharge Information Details Patient Name: Date of Service: [name], [name] 05/05/2023 8:00 A M Medical Record Number: [id number] Patient Account Number: [id number] Date of Birth/Sex: Treating RN: [name] 15, 1952 (72 y.o. Yates Decamp Primary Care [name]: [name] Other Clinician: Referring Hassie Mandt: Treating Jakye Mullens/Extender: Ebony Cargo in Treatment: 0 Encounter Discharge Information Items Discharge Condition: Stable Ambulatory Status: Ambulatory Discharge Destination: Home Transportation: Private Auto Accompanied By: son Schedule Follow-up Appointment: Yes Clinical Summary of Care: Patient Declined Electronic Signature(s) Signed: 06/02/2023 7:49:05 AM By: Brenton Grills Entered By: Brenton Grills on 05/05/2023  09:27:35 -------------------------------------------------------------------------------- Lower Extremity Assessment Details Patient Name: Date of Service: [name], [name] 05/05/2023 8:00 A M Medical Record Number: [id number] Patient Account Number: [id number] Date of Birth/Sex: Treating RN: 09-10-1951 (72 y.o. Yates Decamp Primary Care Shanele Nissan: [name] Other Clinician: Referring Arren Laminack: Treating Dryden Tapley/Extender: Ebony Cargo in Treatment: 0 Edema Assessment Assessed: Kyra Searles: Yes] Franne Forts: Yes] [Left: Edema] [Right: :] Calf Left: Right: Point of Measurement: From Medial Instep 41.8 cm 33.5 cm Ankle Left: Right: Point of Measurement: From Medial Instep 25 cm 22.8 cm Vascular Assessment Pulses: Dorsalis Pedis Palpable: [Left:Yes] [Right:Yes] Blood Pressure: Brachial: [Left:134] [Right:134] Ankle: [Left:Dorsalis Pedis: 144 1.07] [Right:Dorsalis Pedis: 141 1.05] Electronic Signature(s)  Signed: 06/02/2023 7:49:05 AM By: Kinnie Feil, Cristal ([id number]) [id number] Page 3 of 6 Entered By: Brenton Grills on 05/05/2023 08:37:03 -------------------------------------------------------------------------------- Multi Wound Chart Details Patient Name: Date of Service: [name], [name] 05/05/2023 8:00 A M Medical Record Number: [id number] Patient Account Number: [id number] Date of Birth/Sex: Treating RN: Sep 11, 1951 (72 y.o. M) Primary Care [name]: [name] Other Clinician: Referring [name]: Treating Kamylah Manzo/Extender: Ebony Cargo in Treatment: 0 Vital Signs Height(in): 67 Pulse(bpm): 79 Weight(lbs): 193 Blood Pressure(mmHg): 134/80 Body Mass Index(BMI): 30.2 Temperature(F): 98 Respiratory Rate(breaths/min): 18 [1:Photos:] [N/A:N/A] Left, Anterior Lower Leg N/A N/A Wound Location: Gradually Appeared N/A N/A Wounding Event: Diabetic Wound/Ulcer of the Lower N/A  N/A Primary Etiology: Extremity Cataracts, Hypertension, Type II N/A N/A Comorbid History: Diabetes, Seizure Disorder 04/28/2023 N/A N/A Date Acquired: 0 N/A N/A Weeks of Treatment: Open N/A N/A Wound Status: No N/A N/A Wound Recurrence: 15x19x0.1 N/A N/A Measurements L x W x D (cm) 223.838 N/A N/A A (cm) : rea 22.384 N/A N/A Volume (cm) : Grade 2 N/A N/A Classification: Medium N/A N/A Exudate A mount: Serosanguineous N/A N/A Exudate Type: red, brown N/A N/A Exudate Color: Distinct, outline attached N/A N/A Wound Margin: Medium (34-66%) N/A N/A Granulation A mount: Red, Pink N/A N/A Granulation Quality: Medium (34-66%) N/A N/A Necrotic A mount: Fat Layer (Subcutaneous Tissue): Yes N/A N/A Exposed Structures: None N/A N/A Epithelialization: No Abnormalities Noted N/A N/A Periwound Skin Texture: Hemosiderin Staining: Yes N/A N/A Periwound Skin Color: No Abnormality N/A N/A Temperature: Yes N/A N/A Tenderness on Palpation: Treatment Notes Electronic Signature(s) Signed: 05/05/2023 2:24:30 PM By: Geralyn Corwin DO Entered By: Geralyn Corwin on 05/05/2023 09:03:04 -------------------------------------------------------------------------------- Multi-Disciplinary Care Plan Details Patient Name: Date of Service: ELIJA, HOWORTH [name] 05/05/2023 8:00 A M Medical Record Number: [id number] Patient Account Number: [id number] Date of Birth/Sex: Treating RN: 08-21-1951 (72 y.o. Yates Decamp Primary Care Lua Feng: [name] Other Clinician: Referring Orva Gwaltney: Treating Shamekia Tippets/Extender: Loraine Maple [place], Jackson ([id number]) [id number] Page 4 of 6 Weeks in Treatment: 0 Active Inactive Wound/Skin Impairment Nursing Diagnoses: Impaired tissue integrity Goals: Patient/caregiver will verbalize understanding of skin care regimen Date Initiated: 05/05/2023 Target Resolution Date: 06/21/2023 Goal Status:  Active Interventions: Assess patient/caregiver ability to obtain necessary supplies Assess patient/caregiver ability to perform ulcer/skin care regimen upon admission and as needed Assess ulceration(s) every visit Provide education on ulcer and skin care Screen for HBO Treatment Activities: Skin care regimen initiated : 05/05/2023 Topical wound management initiated : 05/05/2023 Notes: Electronic Signature(s) Signed: 06/02/2023 7:49:05 AM By: Brenton Grills Entered By: Brenton Grills on 05/05/2023 08:45:16 -------------------------------------------------------------------------------- Pain Assessment Details Patient Name: Date of Service: [name], [name] 05/05/2023 8:00 A M Medical Record Number: [id number] Patient Account Number: [id number] Date of Birth/Sex: Treating RN: 1951/06/10 (72 y.o. Yates Decamp Primary Care Marcie Shearon: [name] Other Clinician: Referring Sael Furches: Treating Lacee Grey/Extender: Ebony Cargo in Treatment: 0 Active Problems Location of Pain Severity and Description of Pain Patient Has Paino No Site Locations Pain Management and Medication Current Pain Management: Electronic Signature(s) Signed: 06/02/2023 7:49:05 AM By: Kinnie Feil, Jaylene ([id number]) By: Brenton Grills [id number] Page 5 of 6 Signed: 06/02/2023 7:49:05 AM Entered By: Brenton Grills on 05/05/2023 08:43:51 -------------------------------------------------------------------------------- Patient/Caregiver Education Details Patient Name: Date of Service: [name], [name] 5/14/2024andnbsp8:00 A M Medical Record Number: [id number] Patient Account Number: [id number] Date of Birth/Gender: Treating RN: 25-Aug-1951 (72 y.o. Yates Decamp Primary Care Physician: [name] Other Clinician: Referring Physician: Treating Physician/Extender: Loraine Maple  Weeks in Treatment: 0 Education Assessment Education  Provided To: Patient and Caregiver Education Topics Provided Wound/Skin Impairment: Methods: Explain/Verbal Responses: State content correctly Electronic Signature(s) Signed: 06/02/2023 7:49:05 AM By: Brenton Grills Entered By: Brenton Grills on 05/05/2023 08:45:33 -------------------------------------------------------------------------------- Wound Assessment Details Patient Name: Date of Service: [name], [name] 05/05/2023 8:00 A M Medical Record Number: [id number] Patient Account Number: [id number] Date of Birth/Sex: Treating RN: 06-Apr-1951 (72 y.o. Yates Decamp Primary Care Philopater Mucha: [name] Other Clinician: Referring Loyde Orth: Treating Okey Zelek/Extender: Ebony Cargo in Treatment: 0 Wound Status Wound Number: 1 Primary Etiology: Diabetic Wound/Ulcer of the Lower Extremity Wound Location: Left, Anterior Lower Leg Wound Status: Open Wounding Event: Gradually Appeared Comorbid Cataracts, Hypertension, Type II Diabetes, Seizure History: Disorder Date Acquired: 04/28/2023 Weeks Of Treatment: 0 Clustered Wound: No Photos Wound Measurements Length: (cm) 15 Width: (cm) 19 Depth: (cm) 0.1 Area: (cm) 223.838 Volume: (cm) 22.384 % Reduction in Area: % Reduction in Volume: Epithelialization: None Tunneling: No Undermining: No Wound Description Vaquerano, Aarish ([id number]) Classification: Grade 2 Wound Margin: Distinct, outline attached Exudate Amount: Medium Exudate Type: Serosanguineous Exudate Color: red, brown [id number] Page 6 of 6 Foul Odor After Cleansing: No Slough/Fibrino No Wound Bed Granulation Amount: Medium (34-66%) Exposed Structure Granulation Quality: Red, Pink Fat Layer (Subcutaneous Tissue) Exposed: Yes Necrotic Amount: Medium (34-66%) Necrotic Quality: Adherent Slough Periwound Skin Texture Texture Color No Abnormalities Noted: Yes No Abnormalities Noted: No Hemosiderin Staining:  Yes Moisture No Abnormalities Noted: No Temperature / Pain Temperature: No Abnormality Tenderness on Palpation: Yes Electronic Signature(s) Signed: 06/02/2023 7:49:05 AM By: Brenton Grills Entered By: Brenton Grills on 05/05/2023 08:43:23 -------------------------------------------------------------------------------- Vitals Details Patient Name: Date of Service: [name] 05/05/2023 8:00 A M Medical Record Number: [id number] Patient Account Number: [id number] Date of Birth/Sex: Treating RN: 1951/04/04 (72 y.o. Yates Decamp Primary Care Alivya Wegman: [name] Other Clinician: Referring Marthena Whitmyer: Treating Tateanna Bach/Extender: Ebony Cargo in Treatment: 0 Vital Signs Time Taken: 08:00 Temperature (F): 98 Height (in): 67 Pulse (bpm): 79 Weight (lbs): 193 Respiratory Rate (breaths/min): 18 Body Mass Index (BMI): 30.2 Blood Pressure (mmHg): 134/80 Reference Range: 80 - 120 mg / dl Electronic Signature(s) Signed: 06/02/2023 7:49:05 AM By: Brenton Grills Entered By: Brenton Grills on 05/05/2023 08:11:44

## 2023-06-02 NOTE — Progress Notes (Signed)
**Note [name]-Identified via Obfuscation** [name], [name] ([id number]) [id number] Page 1 of 8 Visit Report for 05/05/2023 Chief Complaint Document Details Patient Name: Date of Service: [name], [name] 05/05/2023 8:00 A M Medical Record Number: [id number] Patient Account Number: [id number] Date of Birth/Sex: Treating RN: [birth year] (72 y.o. M) Primary Care Provider: [name] Other Clinician: Referring Provider: Treating Provider/Extender: Ebony Cargo in Treatment: 0 Information Obtained from: Patient Chief Complaint 05/05/2023; left lower extremity wound Electronic Signature(s) Signed: 05/05/2023 2:24:30 PM By: Geralyn Corwin DO Entered By: Geralyn Corwin on 05/05/2023 09:03:32 -------------------------------------------------------------------------------- HPI Details Patient Name: Date of Service: [name], [name] 05/05/2023 8:00 A M Medical Record Number: [id number] Patient Account Number: [id number] Date of Birth/Sex: Treating RN: 04-03-1951 (72 y.o. M) Primary Care Provider: [name] Other Clinician: Referring Provider: Treating Provider/Extender: Ebony Cargo in Treatment: 0 History of Present Illness HPI Description: CONSULT ONLY 04/28/2022 This is a 72 year old man with a past medical history notable for type 2 diabetes mellitus, stroke, and lower extremity edema. Apparently back in January, he did have a wound on his right lower extremity and was seen in the emergency department for this. I am not entirely sure how he ultimately was referred to the wound care center, but today he has no open wounds. He does have significant bilateral lower extremity edema, 2+ up to the knees. He does not wear compression stockings. He says that he applies Vaseline to his legs daily, but the skin is quite dry and scaly. ABIs in clinic today were 0.79 and 0.84. 05/05/2023 [name] is a 72 year old male with a past medical history of  cryptogenic right MCA stroke in 2021, chronic combined systolic and diastolic congestive heart failure, controlled type 2 diabetes on oral agents and lymphedema/chronic venous insufficiency that presents the clinic for a 1 week history of wound to his left lower extremity. He states it started as a blister and has ruptured creating the wound. He has been keeping the area covered. He does not wear compression stockings. He reports taking his Lasix as prescribed but has recently run out over the past day or 2. They are waiting on a refill at the pharmacy. Currently denies signs of infection. Denies shortness of breath or orthopnea. Electronic Signature(s) Signed: 05/05/2023 2:24:30 PM By: Geralyn Corwin DO Entered By: Geralyn Corwin on 05/05/2023 09:06:31 -------------------------------------------------------------------------------- Physical Exam Details Patient Name: Date of Service: [name], [name] 05/05/2023 8:00 A M Medical Record Number: [id number] Patient Account Number: [id number] Date of Birth/Sex: Treating RN: [birth year] (72 y.o. M) Primary Care Provider: [name] Other Clinician: Referring Provider: Treating Provider/Extender: Ebony Cargo in Treatment: 0 Constitutional respirations regular, non-labored and within target range for patient.. Cardiovascular 2+ dorsalis pedis/posterior tibialis pulses. [name], [name] ([id number]) [id number] Page 2 of 8 Psychiatric pleasant and cooperative. Notes T the left lower extremity there is a large open wound with granulation tissue throughout circumferentially to the left lower extremity. No signs of surrounding o infection including increased warmth, erythema or purulent drainage. 2+ pitting edema to the knee. Venous stasis dermatitis and lymphedema skin changes noted Electronic Signature(s) Signed: 05/05/2023 2:24:30 PM By: Geralyn Corwin DO Entered By: Geralyn Corwin on  05/05/2023 09:07:02 -------------------------------------------------------------------------------- Physician Orders Details Patient Name: Date of Service: [name], [name] 05/05/2023 8:00 A M Medical Record Number: [id number] Patient Account Number: [id number] Date of Birth/Sex: Treating RN: Jun 18, 1951 (72 y.o. M) Primary Care Provider: [name] Other Clinician: Referring Provider: Treating Provider/Extender: Ebony Cargo in Treatment: 0 Verbal /  Phone Orders: No Diagnosis Coding ICD-10 Coding Code Description S81.802A Unspecified open wound, left lower leg, initial encounter I87.312 Chronic venous hypertension (idiopathic) with ulcer of left lower extremity I89.0 Lymphedema, not elsewhere classified E11.622 Type 2 diabetes mellitus with other skin ulcer I50.42 Chronic combined systolic (congestive) and diastolic (congestive) heart failure Follow-up Appointments Return Appointment in 1 week. Anesthetic (In clinic) Topical Lidocaine 4% applied to wound bed Bathing/ Shower/ Hygiene May shower with protection but do not get wound dressing(s) wet. Protect dressing(s) with water repellant cover (for example, large plastic bag) or a cast cover and may then take shower. Edema Control - Lymphedema / SCD / Other Bilateral Lower Extremities A void standing for long periods of time. Patient to wear own compression stockings every day. - Right side only If compression wraps slide down please call wound center and speak with a nurse. - Left side Wound Treatment Wound #1 - Lower Leg Wound Laterality: Left, Anterior Cleanser: Vashe 5.8 (oz) Discharge Instructions: Cleanse the wound with Vashe prior to applying a clean dressing using gauze sponges, not tissue or cotton balls. Topical: Mupirocin Ointment Discharge Instructions: Apply Mupirocin (Bactroban) as instructed Prim Dressing: Xeroform Occlusive Gauze Dressing, 4x4 in ary Discharge Instructions: Apply to  wound bed as instructed Secondary Dressing: Bordered Gauze, 4x4 in Discharge Instructions: Apply over primary dressing as directed. Compression Wrap: Kerlix Roll 4.5x3.1 (in/yd) Discharge Instructions: Apply Kerlix and Coban compression as directed. Compression Wrap: Coban Self-Adherent Wrap 4x5 (in/yd) Discharge Instructions: Apply over Kerlix as directed. Electronic Signature(s) Brian, [place] ([id number]) [id number] Page 3 of 8 Signed: 05/05/2023 2:24:30 PM By: Geralyn Corwin DO Signed: 06/02/2023 7:49:05 AM By: Brenton Grills Entered By: Brenton Grills on 05/05/2023 09:22:24 -------------------------------------------------------------------------------- Problem List Details Patient Name: Date of Service: [name], [name] 05/05/2023 8:00 A M Medical Record Number: [id number] Patient Account Number: [id number] Date of Birth/Sex: Treating RN: 1951/04/13 (72 y.o. Yates Decamp Primary Care Provider: [name] Other Clinician: Referring Provider: Treating Provider/Extender: Ebony Cargo in Treatment: 0 Active Problems ICD-10 Encounter Code Description Active Date MDM Diagnosis S81.802A Unspecified open wound, left lower leg, initial encounter 05/05/2023 No Yes I87.312 Chronic venous hypertension (idiopathic) with ulcer of left lower extremity 05/05/2023 No Yes I89.0 Lymphedema, not elsewhere classified 05/05/2023 No Yes E11.622 Type 2 diabetes mellitus with other skin ulcer 05/05/2023 No Yes I50.42 Chronic combined systolic (congestive) and diastolic (congestive) heart failure 05/05/2023 No Yes Inactive Problems Resolved Problems Electronic Signature(s) Signed: 05/05/2023 2:24:30 PM By: Geralyn Corwin DO Entered By: Geralyn Corwin on 05/05/2023 09:03:00 -------------------------------------------------------------------------------- Progress Note Details Patient Name: Date of Service: [name], [name] 05/05/2023 8:00  A M Medical Record Number: [id number] Patient Account Number: [id number] Date of Birth/Sex: Treating RN: 07/29/1951 (72 y.o. M) Primary Care Provider: [name] Other Clinician: Referring Provider: Treating Provider/Extender: Ebony Cargo in Treatment: 0 Subjective Chief Complaint Information obtained from Patient 05/05/2023; left lower extremity wound History of Present Illness (HPI) CONSULT ONLY 04/28/2022 This is a 72 year old man with a past medical history notable for type 2 diabetes mellitus, stroke, and lower extremity edema. Apparently back in January, he did have a wound on his right lower extremity and was seen in the emergency department for this. I am not entirely sure how he ultimately was referred to the wound care center, but today he has no open wounds. He does have significant bilateral lower extremity edema, 2+ up to the knees. He does not wear Suhr, [place] ([id number]) [id number] Page 4 of 8 compression  stockings. He says that he applies Vaseline to his legs daily, but the skin is quite dry and scaly. ABIs in clinic today were 0.79 and 0.84. 05/05/2023 [name] is a 72 year old male with a past medical history of cryptogenic right MCA stroke in 2021, chronic combined systolic and diastolic congestive heart failure, controlled type 2 diabetes on oral agents and lymphedema/chronic venous insufficiency that presents the clinic for a 1 week history of wound to his left lower extremity. He states it started as a blister and has ruptured creating the wound. He has been keeping the area covered. He does not wear compression stockings. He reports taking his Lasix as prescribed but has recently run out over the past day or 2. They are waiting on a refill at the pharmacy. Currently denies signs of infection. Denies shortness of breath or orthopnea. Patient History Information obtained from Patient,  Chart. Allergies Shellfish Containing Products (Severity: Severe) Family History Unknown History, Cancer - Father, Diabetes - Mother, Lung Disease - Mother. Social History Never smoker, Marital Status - Single, Alcohol Use - Never, Drug Use - No History, Caffeine Use - Daily - coffee. Medical History Eyes Patient has history of Cataracts Denies history of Glaucoma, Optic Neuritis Cardiovascular Patient has history of Hypertension Endocrine Patient has history of Type II Diabetes Integumentary (Skin) Denies history of History of Burn Neurologic Patient has history of Seizure Disorder - CVA Denies history of Dementia, Neuropathy, Quadriplegia, Paraplegia Hospitalization/Surgery History - Loop Recorder insertion 01/23/2020. Medical A Surgical History Notes nd Eyes Diabetic Retinopathy Hypertensive Retinopathy Left Homonymous Hemianopsia Cardiovascular Acute Cerebrovascular Accident Neurologic Memory Change Review of Systems (ROS) Eyes Denies complaints or symptoms of Dry Eyes, Vision Changes, Glasses / Contacts. Ear/Nose/Mouth/Throat Denies complaints or symptoms of Chronic sinus problems or rhinitis, Cataract, Diabetic Retinopathy Respiratory Denies complaints or symptoms of Chronic or frequent coughs, Shortness of Breath. Cardiovascular Denies complaints or symptoms of Chest pain. Endocrine Denies complaints or symptoms of Heat/cold intolerance. Genitourinary Denies complaints or symptoms of Frequent urination. Integumentary (Skin) Complains or has symptoms of Wounds - left lower extremity, Stasis Dermatitis Musculoskeletal Denies complaints or symptoms of Muscle Pain, Muscle Weakness. Psychiatric Denies complaints or symptoms of Claustrophobia. Objective Constitutional respirations regular, non-labored and within target range for patient.. Vitals Time Taken: 8:00 AM, Height: 67 in, Weight: 193 lbs, BMI: 30.2, Temperature: 98 F, Pulse: 79 bpm, Respiratory Rate: 18  breaths/min, Blood Pressure: 134/80 mmHg. Cardiovascular 2+ dorsalis pedis/posterior tibialis pulses. Psychiatric pleasant and cooperative. General Notes: T the left lower extremity there is a large open wound with granulation tissue throughout circumferentially to the left lower extremity. No signs o [name], [name] ([id number]) [id number] Page 5 of 8 of surrounding infection including increased warmth, erythema or purulent drainage. 2+ pitting edema to the knee. Venous stasis dermatitis and lymphedema skin changes noted Integumentary (Hair, Skin) Wound #1 status is Open. Original cause of wound was Gradually Appeared. The date acquired was: 04/28/2023. The wound is located on the Left,Anterior Lower Leg. The wound measures 15cm length x 19cm width x 0.1cm depth; 223.838cm^2 area and 22.384cm^3 volume. There is Fat Layer (Subcutaneous Tissue) exposed. There is no tunneling or undermining noted. There is a medium amount of serosanguineous drainage noted. The wound margin is distinct with the outline attached to the wound base. There is medium (34-66%) red, pink granulation within the wound bed. There is a medium (34-66%) amount of necrotic tissue within the wound bed including Adherent Slough. The periwound skin appearance had no abnormalities noted for  texture. The periwound skin appearance exhibited: Hemosiderin Staining. Periwound temperature was noted as No Abnormality. The periwound has tenderness on palpation. Assessment Active Problems ICD-10 Unspecified open wound, left lower leg, initial encounter Chronic venous hypertension (idiopathic) with ulcer of left lower extremity Lymphedema, not elsewhere classified Type 2 diabetes mellitus with other skin ulcer Chronic combined systolic (congestive) and diastolic (congestive) heart failure Patient presents with a 1 week history of wound to his left lower extremity in the setting of lymphedema/chronic venous  insufficiency and complicated by chronic systolic and diastolic heart failure. No signs of acute CHF exacerbation today. ABIs in office were 1.07 on the left And he has palpable pedal pulses suggesting adequate blood flow for healing. We discussed the importance of edema control for his wound healing. He states he is going to pick up his Lasix from the pharmacy today. We discussed compression therapy and patient was agreeable to an in office wrap. I recommended Xeroform and antibiotic ointment under the wrap. He knows to not get this wet or keep this on for more than 7 days. Follow-up in 1 week. Procedures Wound #1 Pre-procedure diagnosis of Wound #1 is a Diabetic Wound/Ulcer of the Lower Extremity located on the Left,Anterior Lower Leg . There was a Three Layer Compression Therapy Procedure with a pre-treatment ABI of 1.1 by Brenton Grills, RN. Post procedure Diagnosis Wound #1: Same as Pre-Procedure Plan Follow-up Appointments: Return Appointment in 1 week. Anesthetic: (In clinic) Topical Lidocaine 4% applied to wound bed Bathing/ Shower/ Hygiene: May shower with protection but do not get wound dressing(s) wet. Protect dressing(s) with water repellant cover (for example, large plastic bag) or a cast cover and may then take shower. Edema Control - Lymphedema / SCD / Other: Avoid standing for long periods of time. Patient to wear own compression stockings every day. - Right side only If compression wraps slide down please call wound center and speak with a nurse. - Left side WOUND #1: - Lower Leg Wound Laterality: Left, Anterior Cleanser: Vashe 5.8 (oz) Discharge Instructions: Cleanse the wound with Vashe prior to applying a clean dressing using gauze sponges, not tissue or cotton balls. Topical: Mupirocin Ointment Discharge Instructions: Apply Mupirocin (Bactroban) as instructed Prim Dressing: Xeroform Occlusive Gauze Dressing, 4x4 in ary Discharge Instructions: Apply to wound bed as  instructed Secondary Dressing: Bordered Gauze, 4x4 in Discharge Instructions: Apply over primary dressing as directed. Com pression Wrap: Kerlix Roll 4.5x3.1 (in/yd) Discharge Instructions: Apply Kerlix and Coban compression as directed. Com pression Wrap: Coban Self-Adherent Wrap 4x5 (in/yd) Discharge Instructions: Apply over Kerlix as directed. 1. Xeroform and antibiotic ointment under Kerlix/Coban to the left lower extremity 2. Follow-up in 1 week Electronic Signature(s) Signed: 05/06/2023 11:01:57 AM By: Geralyn Corwin DO Signed: 05/06/2023 5:20:14 PM By: Shawn Stall RN, BSN Previous Signature: 05/05/2023 2:24:30 PM Version By: Geralyn Corwin DO [name], [name] ([id number]) [id number] Page 6 of 8 Previous Signature: 05/05/2023 2:24:30 PM Version By: Geralyn Corwin DO Entered By: Shawn Stall on 05/06/2023 08:24:34 -------------------------------------------------------------------------------- HxROS Details Patient Name: Date of Service: [name], [name] 05/05/2023 8:00 A M Medical Record Number: [id number] Patient Account Number: [id number] Date of Birth/Sex: Treating RN: 04/15/1951 (72 y.o. Yates Decamp Primary Care Provider: [name] Other Clinician: Referring Provider: Treating Provider/Extender: Ebony Cargo in Treatment: 0 Information Obtained From Patient Chart Eyes Complaints and Symptoms: Negative for: Dry Eyes; Vision Changes; Glasses / Contacts Medical History: Positive for: Cataracts Negative for: Glaucoma; Optic Neuritis Past Medical History Notes: Diabetic Retinopathy Hypertensive  Retinopathy Left Homonymous Hemianopsia Ear/Nose/Mouth/Throat Complaints and Symptoms: Negative for: Chronic sinus problems or rhinitis Review of System Notes: Cataract, Diabetic Retinopathy Respiratory Complaints and Symptoms: Negative for: Chronic or frequent coughs; Shortness of  Breath Cardiovascular Complaints and Symptoms: Negative for: Chest pain Medical History: Positive for: Hypertension Past Medical History Notes: Acute Cerebrovascular Accident Endocrine Complaints and Symptoms: Negative for: Heat/cold intolerance Medical History: Positive for: Type II Diabetes Treated with: Oral agents Blood sugar tested every day: Yes Tested : every day Genitourinary Complaints and Symptoms: Negative for: Frequent urination Integumentary (Skin) Complaints and Symptoms: Positive for: Wounds - left lower extremity Review of System Notes: Stasis Dermatitis Medical History: Negative for: History of Burn Musculoskeletal Complaints and Symptoms: Strickling, [place] ([id number]) [id number] Page 7 of 8 Negative for: Muscle Pain; Muscle Weakness Psychiatric Complaints and Symptoms: Negative for: Claustrophobia Hematologic/Lymphatic Immunological Neurologic Medical History: Positive for: Seizure Disorder - CVA Negative for: Dementia; Neuropathy; Quadriplegia; Paraplegia Past Medical History Notes: Memory Change Oncologic HBO Extended History Items Eyes: Cataracts Immunizations Pneumococcal Vaccine: Received Pneumococcal Vaccination: No Implantable Devices None Hospitalization / Surgery History Type of Hospitalization/Surgery Loop Recorder insertion 01/23/2020 Family and Social History Unknown History: Yes; Cancer: Yes - Father; Diabetes: Yes - Mother; Lung Disease: Yes - Mother; Never smoker; Marital Status - Single; Alcohol Use: Never; Drug Use: No History; Caffeine Use: Daily - coffee; Financial Concerns: No; Food, Clothing or Shelter Needs: No; Support System Lacking: No; Transportation Concerns: No Electronic Signature(s) Signed: 05/05/2023 2:24:30 PM By: Geralyn Corwin DO Signed: 06/02/2023 7:49:05 AM By: Brenton Grills Entered By: Brenton Grills on 05/05/2023  08:14:32 -------------------------------------------------------------------------------- SuperBill Details Patient Name: Date of Service: [name], [name] 05/05/2023 Medical Record Number: [id number] Patient Account Number: [id number] Date of Birth/Sex: Treating RN: 25-May-1951 (72 y.o. M) Primary Care Provider: [name] Other Clinician: Referring Provider: Treating Provider/Extender: Ebony Cargo in Treatment: 0 Diagnosis Coding ICD-10 Codes Code Description [phone] Unspecified open wound, left lower leg, initial encounter I87.312 Chronic venous hypertension (idiopathic) with ulcer of left lower extremity I89.0 Lymphedema, not elsewhere classified E11.622 Type 2 diabetes mellitus with other skin ulcer I50.42 Chronic combined systolic (congestive) and diastolic (congestive) heart failure Facility Procedures : [place], SY 36 CPT4 Code: LVESTER ([id number] (Fa IC S Description: 8) [phone] cility Use Only) [id number] - APPLY MULTLAY COMPRS LWR LT LEG D-10 Diagnosis Description 81.802A Unspecified open wound, left lower leg, initial encounter Modifier: [id number] Quantity: 227.pdf Page 8 of 8 Physician Procedures : CPT4 Code Description Modifier [phone] [id number] - WC PHYS LEVEL 4 - EST PT ICD-10 Diagnosis Description S81.802A Unspecified open wound, left lower leg, initial encounter I87.312 Chronic venous hypertension (idiopathic) with ulcer of left lower extremity  I89.0 Lymphedema, not elsewhere classified E11.622 Type 2 diabetes mellitus with other skin ulcer Quantity: 1 Electronic Signature(s) Signed: 05/05/2023 2:24:30 PM By: Geralyn Corwin DO Signed: 06/02/2023 7:49:05 AM By: Brenton Grills Entered By: Brenton Grills on 05/05/2023 09:24:18

## 2023-06-02 NOTE — Progress Notes (Signed)
Grizzle, Lucio ([id number]) [id number] Page 1 of 6 Visit Report for 05/12/2023 Arrival Information Details Patient Name: Date of Service: [name], [name] 05/12/2023 1:15 PM Medical Record Number: [id number] Patient Account Number: [id number] Date of Birth/Sex: Treating RN: December 20, 1951 (72 y.o. M) Primary Care Yash Cacciola: [name] Other Clinician: Referring Guy Seese: Treating Zohar Maroney/Extender: Ebony Cargo in Treatment: 1 Visit Information History Since Last Visit Added or deleted any medications: No Patient Arrived: Ambulatory Any new allergies or adverse reactions: No Arrival Time: 13:14 Had a fall or experienced change in No Accompanied By: son activities of daily living that may affect Transfer Assistance: None risk of falls: Patient Identification Verified: Yes Signs or symptoms of abuse/neglect since last visito No Secondary Verification Process Completed: Yes Hospitalized since last visit: No Patient Requires Transmission-Based Precautions: No Implantable device outside of the clinic excluding No Patient Has Alerts: No cellular tissue based products placed in the center since last visit: Has Dressing in Place as Prescribed: Yes Has Compression in Place as Prescribed: Yes Pain Present Now: Yes Electronic Signature(s) Signed: 05/15/2023 11:53:23 AM By: Thayer Dallas Entered By: Thayer Dallas on 05/12/2023 13:15:35 -------------------------------------------------------------------------------- Compression Therapy Details Patient Name: Date of Service: [name], [name] 05/12/2023 1:15 PM Medical Record Number: [id number] Patient Account Number: [id number] Date of Birth/Sex: Treating RN: [birth year] (72 y.o. Yates Decamp Primary Care Ruqaya Strauss: [name] Other Clinician: Referring Danarius Mcconathy: Treating Kysa Calais/Extender: Ebony Cargo in Treatment: 1 Compression Therapy Performed for Wound  Assessment: Wound #1 Left,Anterior Lower Leg Performed By: Clinician Brenton Grills, RN Compression Type: Three Layer Post Procedure Diagnosis Same as Pre-procedure Electronic Signature(s) Signed: 06/02/2023 7:50:28 AM By: Brenton Grills Entered By: Brenton Grills on 05/12/2023 13:59:02 -------------------------------------------------------------------------------- Encounter Discharge Information Details Patient Name: Date of Service: [name], [name] [name] 05/12/2023 1:15 PM Medical Record Number: [id number] Patient Account Number: [id number] Date of Birth/Sex: Treating RN: [birth year] (72 y.o. Yates Decamp Primary Care Lucan Riner: [name] Other Clinician: Referring Dailee Manalang: Treating Hoover Grewe/Extender: Ebony Cargo in Treatment: 1 Encounter Discharge Information Items Discharge Condition: Stable Ambulatory Status: Ambulatory Discharge Destination: Home Transportation: Private [address] [place], [place] ([id number]) [phone].pdf Page 2 of 6 Accompanied By: son Schedule Follow-up Appointment: Yes Clinical Summary of Care: Patient Declined Electronic Signature(s) Signed: 06/02/2023 7:50:28 AM By: Brenton Grills Entered By: Brenton Grills on 05/12/2023 14:02:47 -------------------------------------------------------------------------------- Lower Extremity Assessment Details Patient Name: Date of Service: [name], [name] [name] 05/12/2023 1:15 PM Medical Record Number: [id number] Patient Account Number: [id number] Date of Birth/Sex: Treating RN: November 16, 1951 (72 y.o. Yates Decamp Primary Care Mikaelyn Arthurs: [name] Other Clinician: Referring Kenric Ginger: Treating Ebba Goll/Extender: Ebony Cargo in Treatment: 1 Edema Assessment Assessed: Kyra Searles: No] Franne Forts: No] [Left: Edema] [Right: :] Calf Left: Right: Point of Measurement: From Medial Instep 41.8 cm 33.5 cm Ankle Left: Right: Point of Measurement: From Medial  Instep 25 cm 22.8 cm Vascular Assessment Pulses: Dorsalis Pedis Palpable: [Left:Yes] [Right:Yes] Electronic Signature(s) Signed: 06/02/2023 7:50:28 AM By: Brenton Grills Entered By: Brenton Grills on 05/12/2023 13:36:15 -------------------------------------------------------------------------------- Multi Wound Chart Details Patient Name: Date of Service: [name], [name] 05/12/2023 1:15 PM Medical Record Number: [id number] Patient Account Number: [id number] Date of Birth/Sex: Treating RN: 1951-11-05 (73 y.o. M) Primary Care Amorina Doerr: [name] Other Clinician: Referring Sasuke Yaffe: Treating Randale Carvalho/Extender: Ebony Cargo in Treatment: 1 Vital Signs Height(in): 67 Pulse(bpm): 80 Weight(lbs): 193 Blood Pressure(mmHg): 99/63 Body Mass Index(BMI): 30.2 Temperature(F): 98.9 Respiratory Rate(breaths/min): 18 [1:Photos:] [N/A:N/A] Left, Anterior Lower Leg N/A N/A Wound Location: Gradually Appeared  N/A N/A Wounding Event: Diabetic Wound/Ulcer of the Lower N/A N/A Primary Etiology: Extremity Cataracts, Hypertension, Type II N/A N/A Comorbid History: Diabetes, Seizure Disorder 04/28/2023 N/A N/A Date Acquired: 1 N/A N/A Weeks of Treatment: Open N/A N/A Wound Status: No N/A N/A Wound Recurrence: 12.5x17x0.1 N/A N/A Measurements L x W x D (cm) 166.897 N/A N/A A (cm) : rea 16.69 N/A N/A Volume (cm) : 25.40% N/A N/A % Reduction in A rea: 25.40% N/A N/A % Reduction in Volume: Grade 2 N/A N/A Classification: Medium N/A N/A Exudate A mount: Serosanguineous N/A N/A Exudate Type: red, brown N/A N/A Exudate Color: Distinct, outline attached N/A N/A Wound Margin: Medium (34-66%) N/A N/A Granulation A mount: Red, Pink N/A N/A Granulation Quality: Medium (34-66%) N/A N/A Necrotic A mount: Fat Layer (Subcutaneous Tissue): Yes N/A N/A Exposed Structures: Small (1-33%) N/A N/A Epithelialization: No Abnormalities Noted N/A N/A Periwound Skin  Texture: Hemosiderin Staining: Yes N/A N/A Periwound Skin Color: No Abnormality N/A N/A Temperature: Yes N/A N/A Tenderness on Palpation: Treatment Notes Electronic Signature(s) Signed: 05/12/2023 4:32:32 PM By: Geralyn Corwin DO Entered By: Geralyn Corwin on 05/12/2023 13:57:55 -------------------------------------------------------------------------------- Multi-Disciplinary Care Plan Details Patient Name: Date of Service: [name], [name] 05/12/2023 1:15 PM Medical Record Number: [id number] Patient Account Number: [id number] Date of Birth/Sex: Treating RN: 1951/02/22 (72 y.o. Yates Decamp Primary Care Leory Allinson: [name] Other Clinician: Referring Simara Rhyner: Treating Tishina Lown/Extender: Ebony Cargo in Treatment: 1 Active Inactive Wound/Skin Impairment Nursing Diagnoses: Impaired tissue integrity Goals: Patient/caregiver will verbalize understanding of skin care regimen Date Initiated: 05/05/2023 Target Resolution Date: 06/21/2023 Goal Status: Active Interventions: Assess patient/caregiver ability to obtain necessary supplies Assess patient/caregiver ability to perform ulcer/skin care regimen upon admission and as needed Assess ulceration(s) every visit Provide education on ulcer and skin care Screen for HBO Treatment Activities: Skin care regimen initiated : 05/05/2023 Topical wound management initiated : 05/05/2023 Notes: Canny, Cole ([id number]) [id number] Page 4 of 6 Electronic Signature(s) Signed: 06/02/2023 7:50:28 AM By: Brenton Grills Entered By: Brenton Grills on 05/12/2023 13:41:44 -------------------------------------------------------------------------------- Pain Assessment Details Patient Name: Date of Service: [name], [name] [name] 05/12/2023 1:15 PM Medical Record Number: [id number] Patient Account Number: [id number] Date of Birth/Sex: Treating RN: [birth year] (72 y.o. M) Primary Care Jadae Steinke:  [name] Other Clinician: Referring Laynie Espy: Treating Taniesha Glanz/Extender: Ebony Cargo in Treatment: 1 Active Problems Location of Pain Severity and Description of Pain Patient Has Paino Yes Site Locations Pain Location: Pain in Ulcers Rate the pain. Current Pain Level: 10 Pain Management and Medication Current Pain Management: How does your wound impact your activities of daily livingo Sleep: Yes Electronic Signature(s) Signed: 05/15/2023 11:53:23 AM By: Thayer Dallas Entered By: Thayer Dallas on 05/12/2023 13:17:40 -------------------------------------------------------------------------------- Patient/Caregiver Education Details Patient Name: Date of Service: [name], [name] 5/21/2024andnbsp1:15 PM Medical Record Number: [id number] Patient Account Number: [id number] Date of Birth/Gender: Treating RN: [birth year] (72 y.o. Yates Decamp Primary Care Physician: [name] Other Clinician: Referring Physician: Treating Physician/Extender: Ebony Cargo in Treatment: 1 Education Assessment Education Provided To: Patient and Caregiver Education Topics Provided Wound/Skin Impairment: Methods: Explain/Verbal Responses: State content correctly [place], Jacqlyn Krauss ([id number]) [id number] Page 5 of 6 Electronic Signature(s) Signed: 06/02/2023 7:50:28 AM By: Brenton Grills Entered By: Brenton Grills on 05/12/2023 13:42:21 -------------------------------------------------------------------------------- Wound Assessment Details Patient Name: Date of Service: [name], [name] 05/12/2023 1:15 PM Medical Record Number: [id number] Patient Account Number: [id number] Date of Birth/Sex: Treating RN: 08/08/1951 (72 y.o. Yates Decamp Primary Care Daissy Yerian: [name] Other  Clinician: Referring Khilynn Borntreger: Treating Dezhane Staten/Extender: Ebony Cargo in Treatment: 1 Wound Status Wound  Number: 1 Primary Etiology: Diabetic Wound/Ulcer of the Lower Extremity Wound Location: Left, Anterior Lower Leg Wound Status: Open Wounding Event: Gradually Appeared Comorbid Cataracts, Hypertension, Type II Diabetes, Seizure History: Disorder Date Acquired: 04/28/2023 Weeks Of Treatment: 1 Clustered Wound: No Photos Wound Measurements Length: (cm) 12.5 Width: (cm) 17 Depth: (cm) 0.1 Area: (cm) 166.897 Volume: (cm) 16.69 % Reduction in Area: 25.4% % Reduction in Volume: 25.4% Epithelialization: Small (1-33%) Tunneling: No Undermining: No Wound Description Classification: Grade 2 Wound Margin: Distinct, outline attached Exudate Amount: Medium Exudate Type: Serosanguineous Exudate Color: red, brown Foul Odor After Cleansing: No Slough/Fibrino No Wound Bed Granulation Amount: Medium (34-66%) Exposed Structure Granulation Quality: Red, Pink Fat Layer (Subcutaneous Tissue) Exposed: Yes Necrotic Amount: Medium (34-66%) Necrotic Quality: Adherent Slough Periwound Skin Texture Texture Color No Abnormalities Noted: Yes No Abnormalities Noted: No Hemosiderin Staining: Yes Moisture No Abnormalities Noted: No Temperature / Pain Temperature: No Abnormality Tenderness on Palpation: Yes Electronic Signature(s) Signed: 06/02/2023 7:50:28 AM By: Brenton Grills Entered By: Brenton Grills on 05/12/2023 13:37:39 Cude, Jacqlyn Krauss ([id number]) [id number] Page 6 of 6 -------------------------------------------------------------------------------- Vitals Details Patient Name: Date of Service: [name], [name] 05/12/2023 1:15 PM Medical Record Number: [id number] Patient Account Number: [id number] Date of Birth/Sex: Treating RN: 08/31/1951 (72 y.o. M) Primary Care Anel Creighton: [name] Other Clinician: Referring Zayda Angell: Treating Yuko Coventry/Extender: Ebony Cargo in Treatment: 1 Vital Signs Time Taken: 13:15 Temperature (F):  98.9 Height (in): 67 Pulse (bpm): 80 Weight (lbs): 193 Respiratory Rate (breaths/min): 18 Body Mass Index (BMI): 30.2 Blood Pressure (mmHg): 99/63 Reference Range: 80 - 120 mg / dl Electronic Signature(s) Signed: 05/15/2023 11:53:23 AM By: Thayer Dallas Entered By: Thayer Dallas on 05/12/2023 13:17:24

## 2023-06-08 ENCOUNTER — Ambulatory Visit (HOSPITAL_BASED_OUTPATIENT_CLINIC_OR_DEPARTMENT_OTHER): Payer: Medicare HMO | Admitting: Internal Medicine

## 2023-06-08 NOTE — Progress Notes (Signed)
[facility] Summary Report / Loop Recorder 

## 2023-06-11 ENCOUNTER — Encounter (HOSPITAL_BASED_OUTPATIENT_CLINIC_OR_DEPARTMENT_OTHER): Payer: Medicare HMO | Admitting: Internal Medicine

## 2023-06-11 DIAGNOSIS — E11622 Type 2 diabetes mellitus with other skin ulcer: Secondary | ICD-10-CM

## 2023-06-11 DIAGNOSIS — I87313 Chronic venous hypertension (idiopathic) with ulcer of bilateral lower extremity: Secondary | ICD-10-CM

## 2023-06-11 DIAGNOSIS — S81802A Unspecified open wound, left lower leg, initial encounter: Secondary | ICD-10-CM

## 2023-06-11 DIAGNOSIS — S81801A Unspecified open wound, right lower leg, initial encounter: Secondary | ICD-10-CM | POA: Diagnosis not present

## 2023-06-11 NOTE — Progress Notes (Addendum)
[name], [name] ([id number]) [id number] Page 1 of 8 Visit Report for 06/11/2023 Chief Complaint Document Details Patient Name: Date of Service: [name], [name] 06/11/2023 3:00 PM Medical Record Number: [id number] Patient Account Number: [id number] Date of Birth/Sex: Treating RN: [birth year] (72 y.o. M) Primary Care Provider: Fatima [name] Other Clinician: Referring Provider: Treating Provider/Extender: [name] in Treatment: 5 Information Obtained from: Patient Chief Complaint 05/05/2023; left lower extremity wound Electronic Signature(s) Signed: 06/11/2023 4:07:01 PM By: [name] Corwin DO Entered By: [name] on 06/11/2023 15:48:29 -------------------------------------------------------------------------------- HPI Details Patient Name: Date of Service: [name], [name] 06/11/2023 3:00 PM Medical Record Number: [id number] Patient Account Number: [id number] Date of Birth/Sex: Treating RN: January 13, 1951 (72 y.o. M) Primary Care Provider: Fatima [name] Other Clinician: Referring Provider: Treating Provider/Extender: [name] in Treatment: 5 History of Present Illness HPI Description: CONSULT ONLY 04/28/2022 This is a 72 year old man with a past medical history notable for type 2 diabetes mellitus, stroke, and lower extremity edema. Apparently back in January, he did have a wound on his right lower extremity and was seen in the emergency department for this. I am not entirely sure how he ultimately was referred to the wound care center, but today he has no open wounds. He does have significant bilateral lower extremity edema, 2+ up to the knees. He does not wear compression stockings. He says that he applies Vaseline to his legs daily, but the skin is quite dry and scaly. ABIs in clinic today were 0.79 and 0.84. 05/05/2023 [name] is a 72 year old male with a past medical history of  cryptogenic right MCA stroke in 2021, chronic combined systolic and diastolic congestive heart failure, controlled type 2 diabetes on oral agents and lymphedema/chronic venous insufficiency that presents the clinic for a 1 week history of wound to his left lower extremity. He states it started as a blister and has ruptured creating the wound. He has been keeping the area covered. He does not wear compression stockings. He reports taking his Lasix as prescribed but has recently run out over the past day or 2. They are waiting on a refill at the pharmacy. Currently denies signs of infection. Denies shortness of breath or orthopnea. 5/21; patient presents for follow-up. We have been using Xeroform with antibiotic ointment under Kerlix/Coban to the left lower extremity. He has tolerated this well. He has no issues or complaints today. 5/30; patient presents for follow up. We have been using Xeroform and anitbiotic ointment under Kerlix/lower extremity. He took the wrap off due to increased itching to the periwound. Wounds are much smaller. No signs of infection. 6/7; we have been using Xeroform topical antibiotic under kerlix Coban. Unfortunately the patient simply will not leave the wrap in place. He came in with this rolled around his ankle. He says it is "itchy" his swelling is not well-controlled. 6/20; patient presents for follow-up. The dressing was changed at last clinic visit to Tubigrip and Hydrofera Blue to the left leg. This is much larger today. Unfortunately has developed a skin tear vs blister to the right lateral leg. Electronic Signature(s) Signed: 06/11/2023 4:07:01 PM By: [name] Corwin DO [name], [name] ([id number]) [id number] Page 2 of 8 Entered By: [name] on 06/11/2023 15:49:40 -------------------------------------------------------------------------------- Physical Exam Details Patient Name: Date of Service: [name] 06/11/2023  3:00 PM Medical Record Number: [id number] Patient Account Number: [id number] Date of Birth/Sex: Treating RN: 11/12/1951 (72 y.o. M) Primary Care Provider: Fatima [name] Other Clinician: Referring Provider: Treating Provider/Extender:  [name] in Treatment: 5 Constitutional respirations regular, non-labored and within target range for patient.. Cardiovascular 2+ dorsalis pedis/posterior tibialis pulses. Psychiatric pleasant and cooperative. Notes T the left lower extremity there is open wound to the anterior aspect with granulation tissue throughout. For edema control. T the right lower extremity there is o o an open wound to the lateral aspect with granulation tissue present. No signs of surrounding infection. 2+ pitting edema to the knees bilaterally. Electronic Signature(s) Signed: 06/11/2023 4:07:01 PM By: [name] Corwin DO Entered By: [name] on 06/11/2023 15:50:26 -------------------------------------------------------------------------------- Physician Orders Details Patient Name: Date of Service: [name] 06/11/2023 3:00 PM Medical Record Number: [id number] Patient Account Number: [id number] Date of Birth/Sex: Treating RN: 1951-08-29 (72 y.o. Harlon Flor, Millard.Loa Primary Care Provider: Fatima [name] Other Clinician: Referring Provider: Treating Provider/Extender: [name] in Treatment: 5 Verbal / Phone Orders: No Diagnosis Coding ICD-10 Coding Code Description [id number] Unspecified open wound, left lower leg, initial encounter I87.312 Chronic venous hypertension (idiopathic) with ulcer of left lower extremity I89.0 Lymphedema, not elsewhere classified E11.622 Type 2 diabetes mellitus with other skin ulcer I50.42 Chronic combined systolic (congestive) and diastolic (congestive) heart failure Follow-up Appointments ppointment in 1 week. - Dr. Mikey Bussing 06/16/2023 Tuesday 1100 room 9 Return A ppointment in 2  [name]. - Dr. Mikey Bussing Monday 06/22/2023 330pm room 9 Return A Other: - Will try to get home health started twice a week. Bathing/ Shower/ Hygiene May shower with protection but do not get wound dressing(s) wet. Protect dressing(s) with water repellant cover (for example, large plastic bag) or a cast cover and may then take shower. Edema Control - Lymphedema / SCD / Other Marut, Hulbert ([id number]) [phone].pdf Page 3 of 8 Bilateral Lower Extremities Elevate legs to the level of the heart or above for 30 minutes daily and/or when sitting for 3-4 times a day throughout the day. A void standing for long periods of time. If compression wraps slide down please call wound center and speak with a nurse. Home Health Admit to Home Health for skilled nursing wound care. May utilize formulary equivalent dressing for wound treatment orders unless otherwise specified. New wound care orders this week; continue Home Health for wound care. May utilize formulary equivalent dressing for wound treatment orders unless otherwise specified. - Wednesday and Friday dressing changes. xeroform kerlix and coban to bilateral lower legs. Wound Treatment Wound #1 - Lower Leg Wound Laterality: Left, Anterior Cleanser: Soap and Water 3 x Per Week/30 Days Discharge Instructions: May shower and wash wound with dial antibacterial soap and water prior to dressing change. Peri-Wound Care: Sween Lotion (Moisturizing lotion) 3 x Per Week/30 Days Discharge Instructions: Apply moisturizing lotion as directed Topical: Gentamicin 3 x Per Week/30 Days Discharge Instructions: applied in clinic. Topical: Mupirocin Ointment 3 x Per Week/30 Days Discharge Instructions: applied in clinic. Prim Dressing: Xeroform Occlusive Gauze Dressing, 4x4 in 3 x Per Week/30 Days ary Discharge Instructions: Apply to wound bed as instructed Secondary Dressing: ABD Pad, 8x10 3 x Per Week/30 Days Discharge Instructions:  Apply over primary dressing as directed. Compression Wrap: Kerlix Roll 4.5x3.1 (in/yd) 3 x Per Week/30 Days Discharge Instructions: Apply Kerlix and Coban compression as directed. Compression Wrap: Coban Self-Adherent Wrap 4x5 (in/yd) 3 x Per Week/30 Days Discharge Instructions: Apply over Kerlix as directed. Wound #2 - Lower Leg Wound Laterality: Right, Lateral Cleanser: Soap and Water 3 x Per Week/30 Days Discharge Instructions: May shower and wash wound with dial antibacterial soap and  water prior to dressing change. Peri-Wound Care: Sween Lotion (Moisturizing lotion) 3 x Per Week/30 Days Discharge Instructions: Apply moisturizing lotion as directed Topical: Gentamicin 3 x Per Week/30 Days Discharge Instructions: applied in clinic. Topical: Mupirocin Ointment 3 x Per Week/30 Days Discharge Instructions: applied in clinic. Prim Dressing: Xeroform Occlusive Gauze Dressing, 4x4 in 3 x Per Week/30 Days ary Discharge Instructions: Apply to wound bed as instructed Secondary Dressing: ABD Pad, 8x10 3 x Per Week/30 Days Discharge Instructions: Apply over primary dressing as directed. Compression Wrap: Kerlix Roll 4.5x3.1 (in/yd) 3 x Per Week/30 Days Discharge Instructions: Apply Kerlix and Coban compression as directed. Compression Wrap: Coban Self-Adherent Wrap 4x5 (in/yd) 3 x Per Week/30 Days Discharge Instructions: Apply over Kerlix as directed. Electronic Signature(s) Signed: 06/11/2023 4:07:01 PM By: [name] Corwin DO Entered By: [name] on 06/11/2023 15:50:50 Problem List Details -------------------------------------------------------------------------------- [name] ([id number]) [id number] Page 4 of 8 Patient Name: Date of Service: [name], [name] 06/11/2023 3:00 PM Medical Record Number: [id number] Patient Account Number: [id number] Date of Birth/Sex: Treating RN: 01-Feb-1951 (72 y.o. [name] Primary Care Provider: Fatima [name] Other Clinician: Referring Provider: Treating Provider/Extender: [name] in Treatment: 5 Active Problems ICD-10 Encounter Code Description Active Date MDM Diagnosis S81.802A Unspecified open wound, left lower leg, initial encounter 05/05/2023 No Yes S81.801A Unspecified open wound, right lower leg, initial encounter 06/11/2023 No Yes I87.313 Chronic venous hypertension (idiopathic) with ulcer of bilateral lower extremity 06/11/2023 No Yes I89.0 Lymphedema, not elsewhere classified 05/05/2023 No Yes E11.622 Type 2 diabetes mellitus with other skin ulcer 05/05/2023 No Yes I50.42 Chronic combined systolic (congestive) and diastolic (congestive) heart failure 05/05/2023 No Yes Inactive Problems Resolved Problems Electronic Signature(s) Signed: 06/11/2023 4:07:01 PM By: [name] Corwin DO Entered By: [name] on 06/11/2023 15:48:12 -------------------------------------------------------------------------------- Progress Note Details Patient Name: Date of Service: [name], [name] 06/11/2023 3:00 PM Medical Record Number: [id number] Patient Account Number: [id number] Date of Birth/Sex: Treating RN: 1951/11/18 (72 y.o. M) Primary Care Provider: Fatima [name] Other Clinician: Referring Provider: Treating Provider/Extender: [name] in Treatment: 5 Subjective Chief Complaint Information obtained from Patient 05/05/2023; left lower extremity wound History of Present Illness (HPI) CONSULT ONLY 04/28/2022 This is a 72 year old man with a past medical history notable for type 2 diabetes mellitus, stroke, and lower extremity edema. Apparently back in January, he did have a wound on his right lower extremity and was seen in the emergency department for this. I am not entirely sure how he ultimately was referred to the wound care center, but today he has no open wounds. He does have significant bilateral lower extremity edema, 2+ up to  the knees. He does not wear Merlin, Subhan ([id number]) [id number] Page 5 of 8 compression stockings. He says that he applies Vaseline to his legs daily, but the skin is quite dry and scaly. ABIs in clinic today were 0.79 and 0.84. 05/05/2023 Mr. Iyan Martines is a 72 year old male with a past medical history of cryptogenic right MCA stroke in 2021, chronic combined systolic and diastolic congestive heart failure, controlled type 2 diabetes on oral agents and lymphedema/chronic venous insufficiency that presents the clinic for a 1 week history of wound to his left lower extremity. He states it started as a blister and has ruptured creating the wound. He has been keeping the area covered. He does not wear compression stockings. He reports taking his Lasix as prescribed but has recently run out over the past day or 2. They are  waiting on a refill at the pharmacy. Currently denies signs of infection. Denies shortness of breath or orthopnea. 5/21; patient presents for follow-up. We have been using Xeroform with antibiotic ointment under Kerlix/Coban to the left lower extremity. He has tolerated this well. He has no issues or complaints today. 5/30; patient presents for follow up. We have been using Xeroform and anitbiotic ointment under Kerlix/lower extremity. He took the wrap off due to increased itching to the periwound. Wounds are much smaller. No signs of infection. 6/7; we have been using Xeroform topical antibiotic under kerlix Coban. Unfortunately the patient simply will not leave the wrap in place. He came in with this rolled around his ankle. He says it is "itchy" his swelling is not well-controlled. 6/20; patient presents for follow-up. The dressing was changed at last clinic visit to Tubigrip and Hydrofera Blue to the left leg. This is much larger today. Unfortunately has developed a skin tear vs blister to the right lateral leg. Patient History Information  obtained from Patient, Chart. Family History Unknown History, Cancer - Father, Diabetes - Mother, Lung Disease - Mother. Social History Never smoker, Marital Status - Single, Alcohol Use - Never, Drug Use - No History, Caffeine Use - Daily - coffee. Medical History Eyes Patient has history of Cataracts Denies history of Glaucoma, Optic Neuritis Cardiovascular Patient has history of Hypertension Endocrine Patient has history of Type II Diabetes Integumentary (Skin) Denies history of History of Burn Neurologic Patient has history of Seizure Disorder - CVA Denies history of Dementia, Neuropathy, Quadriplegia, Paraplegia Hospitalization/Surgery History - Loop Recorder insertion 01/23/2020. Medical A Surgical History Notes nd Eyes Diabetic Retinopathy Hypertensive Retinopathy Left Homonymous Hemianopsia Cardiovascular Acute Cerebrovascular Accident Neurologic Memory Change Objective Constitutional respirations regular, non-labored and within target range for patient.. Vitals Time Taken: 3:17 PM, Height: 67 in, Weight: 193 lbs, BMI: 30.2, Temperature: 97.7 F, Pulse: 61 bpm, Respiratory Rate: 18 breaths/min, Blood Pressure: 119/57 mmHg. Cardiovascular 2+ dorsalis pedis/posterior tibialis pulses. Psychiatric pleasant and cooperative. General Notes: T the left lower extremity there is open wound to the anterior aspect with granulation tissue throughout. For edema control. T the right lower o o extremity there is an open wound to the lateral aspect with granulation tissue present. No signs of surrounding infection. 2+ pitting edema to the knees bilaterally. Integumentary (Hair, Skin) Wound #1 status is Open. Original cause of wound was Gradually Appeared. The date acquired was: 04/28/2023. The wound has been in treatment 5 [name]. The wound is located on the Left,Anterior Lower Leg. The wound measures 7cm length x 5.1cm width x 0.1cm depth; 28.039cm^2 area and 2.804cm^3 volume. There is  Fat Layer (Subcutaneous Tissue) exposed. There is no tunneling or undermining noted. There is a medium amount of serosanguineous drainage noted. The wound margin is distinct with the outline attached to the wound base. There is large (67-100%) pink, pale granulation within the wound bed. There is a small (1-33%) amount of necrotic tissue within the wound bed including Adherent Slough. The periwound skin appearance exhibited: Callus, Scarring, Dry/Scaly. The periwound skin appearance did not exhibit: Crepitus, Excoriation, Induration, Rash, Maceration, Atrophie Blanche, Cyanosis, Ecchymosis, Hemosiderin Hillis, Roemello ([id number]) [phone].pdf Page 6 of 8 Staining, Mottled, Pallor, Rubor, Erythema. Periwound temperature was noted as No Abnormality. The periwound has tenderness on palpation. Wound #2 status is Open. Original cause of wound was Gradually Appeared. The date acquired was: 06/10/2023. The wound is located on the Right,Lateral Lower Leg. The wound measures 3.4cm length x 4.2cm width x 0.1cm depth;  11.215cm^2 area and 1.122cm^3 volume. There is Fat Layer (Subcutaneous Tissue) exposed. There is no tunneling or undermining noted. There is a medium amount of serosanguineous drainage noted. The wound margin is distinct with the outline attached to the wound base. There is large (67-100%) red granulation within the wound bed. There is no necrotic tissue within the wound bed. The periwound skin appearance did not exhibit: Callus, Crepitus, Excoriation, Induration, Rash, Scarring, Dry/Scaly, Maceration, Atrophie Blanche, Cyanosis, Ecchymosis, Hemosiderin Staining, Mottled, Pallor, Rubor, Erythema. Periwound temperature was noted as No Abnormality. Assessment Active Problems ICD-10 Unspecified open wound, left lower leg, initial encounter Unspecified open wound, right lower leg, initial encounter Chronic venous hypertension (idiopathic) with ulcer of bilateral lower  extremity Lymphedema, not elsewhere classified Type 2 diabetes mellitus with other skin ulcer Chronic combined systolic (congestive) and diastolic (congestive) heart failure Patient has bilateral lower extremity wounds in the setting of venous insufficiency. New wound on the right leg. Tubigrip was attempted last week to the left lower extremity however wound declined. I recommended going back to Xeroform and adding antibiotic ointment and continuing compression wraps. He does have an issue with taking these off. It is not quite clear why he takes them off other than he finds them to be a disturbance. I recommended home health so at least these can be changed 2 more times weekly in case he does take them off. Patient was agreeable with this. He needs better edema control in order to heal these wounds and he is aware of this. Plan Follow-up Appointments: Return Appointment in 1 week. - Dr. Mikey Bussing 06/16/2023 Tuesday 1100 room 9 Return Appointment in 2 [name]. - Dr. Mikey Bussing Monday 06/22/2023 330pm room 9 Other: - Will try to get home health started twice a week. Bathing/ Shower/ Hygiene: May shower with protection but do not get wound dressing(s) wet. Protect dressing(s) with water repellant cover (for example, large plastic bag) or a cast cover and may then take shower. Edema Control - Lymphedema / SCD / Other: Elevate legs to the level of the heart or above for 30 minutes daily and/or when sitting for 3-4 times a day throughout the day. Avoid standing for long periods of time. If compression wraps slide down please call wound center and speak with a nurse. Home Health: Admit to Home Health for skilled nursing wound care. May utilize formulary equivalent dressing for wound treatment orders unless otherwise specified. New wound care orders this week; continue Home Health for wound care. May utilize formulary equivalent dressing for wound treatment orders unless otherwise specified. - Wednesday and  Friday dressing changes. xeroform kerlix and coban to bilateral lower legs. WOUND #1: - Lower Leg Wound Laterality: Left, Anterior Cleanser: Soap and Water 3 x Per Week/30 Days Discharge Instructions: May shower and wash wound with dial antibacterial soap and water prior to dressing change. Peri-Wound Care: Sween Lotion (Moisturizing lotion) 3 x Per Week/30 Days Discharge Instructions: Apply moisturizing lotion as directed Topical: Gentamicin 3 x Per Week/30 Days Discharge Instructions: applied in clinic. Topical: Mupirocin Ointment 3 x Per Week/30 Days Discharge Instructions: applied in clinic. Prim Dressing: Xeroform Occlusive Gauze Dressing, 4x4 in 3 x Per Week/30 Days ary Discharge Instructions: Apply to wound bed as instructed Secondary Dressing: ABD Pad, 8x10 3 x Per Week/30 Days Discharge Instructions: Apply over primary dressing as directed. Com pression Wrap: Kerlix Roll 4.5x3.1 (in/yd) 3 x Per Week/30 Days Discharge Instructions: Apply Kerlix and Coban compression as directed. Com pression Wrap: Coban Self-Adherent Wrap 4x5 (in/yd) 3  x Per Week/30 Days Discharge Instructions: Apply over Kerlix as directed. WOUND #2: - Lower Leg Wound Laterality: Right, Lateral Cleanser: Soap and Water 3 x Per Week/30 Days Discharge Instructions: May shower and wash wound with dial antibacterial soap and water prior to dressing change. Peri-Wound Care: Sween Lotion (Moisturizing lotion) 3 x Per Week/30 Days Discharge Instructions: Apply moisturizing lotion as directed Topical: Gentamicin 3 x Per Week/30 Days Discharge Instructions: applied in clinic. Topical: Mupirocin Ointment 3 x Per Week/30 Days Discharge Instructions: applied in clinic. Prim Dressing: Xeroform Occlusive Gauze Dressing, 4x4 in 3 x Per Week/30 Days ary Discharge Instructions: Apply to wound bed as instructed Secondary Dressing: ABD Pad, 8x10 3 x Per Week/30 Days Discharge Instructions: Apply over primary dressing as  directed. Com pression Wrap: Kerlix Roll 4.5x3.1 (in/yd) 3 x Per Week/30 Days Discharge Instructions: Apply Kerlix and Coban compression as directed. Com pression Wrap: Coban Self-Adherent Wrap 4x5 (in/yd) 3 x Per Week/30 Days Discharge Instructions: Apply over Kerlix as directed. [name], [name] ([id number]) [id number] Page 7 of 8 1. Xeroform with antibiotic ointment under Kerlix/Coban to the lower extremities bilaterally 2. Follow-up in 1 week 3. Order home health Electronic Signature(s) Signed: 06/11/2023 4:07:01 PM By: [name] Corwin DO Entered By: [name] on 06/11/2023 15:54:06 -------------------------------------------------------------------------------- HxROS Details Patient Name: Date of Service: [name], [name] 06/11/2023 3:00 PM Medical Record Number: [id number] Patient Account Number: [id number] Date of Birth/Sex: Treating RN: 07/31/1951 (72 y.o. M) Primary Care Provider: Fatima [name] Other Clinician: Referring Provider: Treating Provider/Extender: [name] in Treatment: 5 Information Obtained From Patient Chart Eyes Medical History: Positive for: Cataracts Negative for: Glaucoma; Optic Neuritis Past Medical History Notes: Diabetic Retinopathy Hypertensive Retinopathy Left Homonymous Hemianopsia Cardiovascular Medical History: Positive for: Hypertension Past Medical History Notes: Acute Cerebrovascular Accident Endocrine Medical History: Positive for: Type II Diabetes Treated with: Oral agents Blood sugar tested every day: Yes Tested : every day Integumentary (Skin) Medical History: Negative for: History of Burn Neurologic Medical History: Positive for: Seizure Disorder - CVA Negative for: Dementia; Neuropathy; Quadriplegia; Paraplegia Past Medical History Notes: Memory Change HBO Extended History Items Eyes: Cataracts Immunizations Pneumococcal Vaccine: Received Pneumococcal  Vaccination: No Implantable Devices Drum, Nahshon ([id number]) [id number] Page 8 of 8 None Hospitalization / Surgery History Type of Hospitalization/Surgery Loop Recorder insertion 01/23/2020 Family and Social History Unknown History: Yes; Cancer: Yes - Father; Diabetes: Yes - Mother; Lung Disease: Yes - Mother; Never smoker; Marital Status - Single; Alcohol Use: Never; Drug Use: No History; Caffeine Use: Daily - coffee; Financial Concerns: No; Food, Clothing or Shelter Needs: No; Support System Lacking: No; Transportation Concerns: No Electronic Signature(s) Signed: 06/11/2023 4:07:01 PM By: [name] Corwin DO Entered By: [name] on 06/11/2023 15:49:46 -------------------------------------------------------------------------------- SuperBill Details Patient Name: Date of Service: [name], [name] 06/11/2023 Medical Record Number: [id number] Patient Account Number: [id number] Date of Birth/Sex: Treating RN: 08/01/1951 (72 y.o. M) Primary Care Provider: Fatima [name] Other Clinician: Referring Provider: Treating Provider/Extender: [name] in Treatment: 5 Diagnosis Coding ICD-10 Codes Code Description [phone] Unspecified open wound, left lower leg, initial encounter S81.801A Unspecified open wound, right lower leg, initial encounter I87.313 Chronic venous hypertension (idiopathic) with ulcer of bilateral lower extremity I89.0 Lymphedema, not elsewhere classified E11.622 Type 2 diabetes mellitus with other skin ulcer I50.42 Chronic combined systolic (congestive) and diastolic (congestive) heart failure Facility Procedures : CPT4 Code: [id number] Description: 27253 - WOUND CARE VISIT-LEV 3 EST PT Modifier: Quantity: 1 Physician Procedures : CPT4 Code Description Modifier [id number] [phone] -  WC PHYS LEVEL 3 - EST PT ICD-10 Diagnosis Description S81.802A Unspecified open wound, left lower leg, initial encounter S81.801A  Unspecified open wound, right lower leg, initial encounter I87.313 Chronic  venous hypertension (idiopathic) with ulcer of bilateral lower extremity E11.622 Type 2 diabetes mellitus with other skin ulcer Quantity: 1 Electronic Signature(s) Signed: 06/22/2023 11:45:46 AM By: Pearletha Alfred Signed: 06/22/2023 1:38:23 PM By: [name] Corwin DO Previous Signature: 06/11/2023 4:07:01 PM Version By: [name] Corwin DO Entered By: Pearletha Alfred on 06/22/2023 11:45:46

## 2023-06-12 NOTE — Progress Notes (Addendum)
Spies, Cordelle ([id number]) [id number] Page 1 of 9 Visit Report for 06/11/2023 Arrival Information Details Patient Name: Date of Service: [name], [name] 06/11/2023 3:00 PM Medical Record Number: [id number] Patient Account Number: [id number] Date of Birth/Sex: Treating RN: 09-09-1951 (72 y.o. M) Primary Care Akua Blethen: [name] Other Clinician: Referring Daman Steffenhagen: Treating Tangala Wiegert/Extender: Ebony Cargo in Treatment: 5 Visit Information History Since Last Visit Added or deleted any medications: No Patient Arrived: Ambulatory Any new allergies or adverse reactions: No Arrival Time: 15:10 Had a fall or experienced change in No Accompanied By: son activities of daily living that may affect Transfer Assistance: None risk of falls: Patient Identification Verified: Yes Signs or symptoms of abuse/neglect since last visito No Secondary Verification Process Completed: Yes Hospitalized since last visit: No Patient Requires Transmission-Based Precautions: No Implantable device outside of the clinic excluding No Patient Has Alerts: No cellular tissue based products placed in the center since last visit: Has Dressing in Place as Prescribed: Yes Has Compression in Place as Prescribed: Yes Pain Present Now: No Electronic Signature(s) Signed: 06/12/2023 2:31:37 PM By: Thayer Dallas Entered By: Thayer Dallas on 06/11/2023 15:16:50 -------------------------------------------------------------------------------- Clinic Level of Care Assessment Details Patient Name: Date of Service: [name], [name] 06/11/2023 3:00 PM Medical Record Number: [id number] Patient Account Number: [id number] Date of Birth/Sex: Treating RN: 20-Dec-1951 (72 y.o. M) Primary Care Tedford Berg: [name] Other Clinician: Referring Jackqueline Aquilar: Treating Luddie Boghosian/Extender: Ebony Cargo in Treatment: 5 Clinic Level of Care Assessment Items TOOL 4  Quantity Score []  - 0 Use when only an EandM is performed on FOLLOW-UP visit ASSESSMENTS - Nursing Assessment / Reassessment X- 1 10 Reassessment of Co-morbidities (includes updates in patient status) X- 1 5 Reassessment of Adherence to Treatment Plan ASSESSMENTS - Wound and Skin A ssessment / Reassessment []  - 0 Simple Wound Assessment / Reassessment - one wound X- 2 5 Complex Wound Assessment / Reassessment - multiple wounds []  - 0 Dermatologic / Skin Assessment (not related to wound area) ASSESSMENTS - Focused Assessment X- 1 5 Circumferential Edema Measurements - multi extremities []  - 0 Nutritional Assessment / Counseling / Intervention Kotecki, Claxton ([id number]) [id number] Page 2 of 9 []  - 0 Lower Extremity Assessment (monofilament, tuning fork, pulses) []  - 0 Peripheral Arterial Disease Assessment (using hand held doppler) ASSESSMENTS - Ostomy and/or Continence Assessment and Care []  - 0 Incontinence Assessment and Management []  - 0 Ostomy Care Assessment and Management (repouching, etc.) PROCESS - Coordination of Care X - Simple Patient / Family Education for ongoing care 1 15 []  - 0 Complex (extensive) Patient / Family Education for ongoing care X- 1 10 Staff obtains Chiropractor, Records, T Results / Process Orders est []  - 0 Staff telephones HHA, Nursing Homes / Clarify orders / etc []  - 0 Routine Transfer to another Facility (non-emergent condition) []  - 0 Routine Hospital Admission (non-emergent condition) []  - 0 New Admissions / Manufacturing engineer / Ordering NPWT Apligraf, etc. , []  - 0 Emergency Hospital Admission (emergent condition) []  - 0 Simple Discharge Coordination []  - 0 Complex (extensive) Discharge Coordination PROCESS - Special Needs []  - 0 Pediatric / Minor Patient Management []  - 0 Isolation Patient Management []  - 0 Hearing / Language / Visual special needs []  - 0 Assessment of Community assistance  (transportation, D/C planning, etc.) []  - 0 Additional assistance / Altered mentation []  - 0 Support Surface(s) Assessment (bed, cushion, seat, etc.) INTERVENTIONS - Wound Cleansing / Measurement []  - 0 Simple Wound Cleansing - one wound  X- 2 5 Complex Wound Cleansing - multiple wounds X- 1 5 Wound Imaging (photographs - any number of wounds) []  - 0 Wound Tracing (instead of photographs) []  - 0 Simple Wound Measurement - one wound X- 2 5 Complex Wound Measurement - multiple wounds INTERVENTIONS - Wound Dressings []  - 0 Small Wound Dressing one or multiple wounds X- 2 15 Medium Wound Dressing one or multiple wounds []  - 0 Large Wound Dressing one or multiple wounds []  - 0 Application of Medications - topical []  - 0 Application of Medications - injection INTERVENTIONS - Miscellaneous []  - 0 External ear exam []  - 0 Specimen Collection (cultures, biopsies, blood, body fluids, etc.) []  - 0 Specimen(s) / Culture(s) sent or taken to Lab for analysis []  - 0 Patient Transfer (multiple staff / Nurse, adult / Similar devices) []  - 0 Simple Staple / Suture removal (25 or less) []  - 0 Complex Staple / Suture removal (26 or more) []  - 0 Hypo / Hyperglycemic Management (close monitor of Blood Glucose) [name], [name] ([id number]) [id number] Page 3 of 9 []  - 0 Ankle / Brachial Index (ABI) - do not check if billed separately X- 1 5 Vital Signs Has the patient been seen at the hospital within the last three years: Yes Total Score: 115 Level Of Care: New/Established - Level 3 Electronic Signature(s) Signed: 06/22/2023 3:00:01 PM By: Pearletha Alfred Entered By: Pearletha Alfred on 06/22/2023 11:45:37 -------------------------------------------------------------------------------- Encounter Discharge Information Details Patient Name: Date of Service: [name], [name] 06/11/2023 3:00 PM Medical Record Number: [id number] Patient Account Number: [id number] Date  of Birth/Sex: Treating RN: [birth year] (72 y.o. [name] Primary Care Sherrol Vicars: [name] Other Clinician: Referring Evella Kasal: Treating Rayder Sullenger/Extender: Ebony Cargo in Treatment: 5 Encounter Discharge Information Items Discharge Condition: Stable Ambulatory Status: Ambulatory Discharge Destination: Home Transportation: Private Auto Accompanied By: family Schedule Follow-up Appointment: Yes Clinical Summary of Care: Electronic Signature(s) Signed: 06/11/2023 6:06:31 PM By: Shawn Stall RN, BSN Entered By: Shawn Stall on 06/11/2023 15:51:21 -------------------------------------------------------------------------------- Lower Extremity Assessment Details Patient Name: Date of Service: JIM, MYNHIER [name] 06/11/2023 3:00 PM Medical Record Number: [id number] Patient Account Number: [id number] Date of Birth/Sex: Treating RN: 1951/10/16 (71 y.o. M) Primary Care Anoop Hemmer: [name] Other Clinician: Referring Terron Merfeld: Treating Karl Erway/Extender: Ebony Cargo in Treatment: 5 Edema Assessment Assessed: [Left: No] [Right: No] Edema: [Left: Ye] [Right: s] Calf Left: Right: Point of Measurement: From Medial Instep 38.2 cm 39.3 cm Ankle Left: Right: Point of Measurement: From Medial Instep 23.4 cm 22.5 cm Electronic Signature(s) [name], [name] ([id number]) [id number] Page 4 of 9 Signed: 06/12/2023 2:31:37 PM By: Thayer Dallas Entered By: Thayer Dallas on 06/11/2023 15:19:34 -------------------------------------------------------------------------------- Multi Wound Chart Details Patient Name: Date of Service: [name], [name] 06/11/2023 3:00 PM Medical Record Number: [id number] Patient Account Number: [id number] Date of Birth/Sex: Treating RN: [birth year] (72 y.o. M) Primary Care Abhijot Straughter: [name] Other Clinician: Referring Fern Asmar: Treating Darey Hershberger/Extender: Ebony Cargo in Treatment: 5 Vital Signs Height(in): 67 Pulse(bpm): 61 Weight(lbs): 193 Blood Pressure(mmHg): 119/57 Body Mass Index(BMI): 30.2 Temperature(F): 97.7 Respiratory Rate(breaths/min): 18 [1:Photos:] [N/A:N/A] Left, Anterior Lower Leg Right, Lateral Lower Leg N/A Wound Location: Gradually Appeared Gradually Appeared N/A Wounding Event: Diabetic Wound/Ulcer of the Lower Diabetic Wound/Ulcer of the Lower N/A Primary Etiology: Extremity Extremity Cataracts, Hypertension, Type II Cataracts, Hypertension, Type II N/A Comorbid History: Diabetes, Seizure Disorder Diabetes, Seizure Disorder 04/28/2023 06/10/2023 N/A Date Acquired: 5 0 N/A Weeks of Treatment: Open Open N/A Wound Status: No  No N/A Wound Recurrence: Yes No N/A Clustered Wound: 2 N/A N/A Clustered Quantity: 7x5.1x0.1 3.4x4.2x0.1 N/A Measurements L x W x D (cm) 28.039 11.215 N/A A (cm) : rea 2.804 1.122 N/A Volume (cm) : 87.50% N/A N/A % Reduction in A rea: 87.50% N/A N/A % Reduction in Volume: Grade 2 Grade 1 N/A Classification: Medium Medium N/A Exudate A mount: Serosanguineous Serosanguineous N/A Exudate Type: red, brown red, brown N/A Exudate Color: Distinct, outline attached Distinct, outline attached N/A Wound Margin: Large (67-100%) Large (67-100%) N/A Granulation A mount: Pink, Pale Red N/A Granulation Quality: Small (1-33%) None Present (0%) N/A Necrotic A mount: Fat Layer (Subcutaneous Tissue): Yes Fat Layer (Subcutaneous Tissue): Yes N/A Exposed Structures: Fascia: No Tendon: No Muscle: No Joint: No Bone: No Small (1-33%) None N/A Epithelialization: Callus: Yes Excoriation: No N/A Periwound Skin Texture: Scarring: Yes Induration: No Excoriation: No Callus: No Induration: No Crepitus: No Crepitus: No Rash: No Rash: No Scarring: No Dry/Scaly: Yes Maceration: No N/A Periwound Skin Moisture: Maceration: No Dry/Scaly: No Atrophie Blanche: No Atrophie Blanche: No  N/A Periwound Skin Color: Cyanosis: No Cyanosis: No [name], Kendarrius ([id number]) [id number] Page 5 of 9 Ecchymosis: No Ecchymosis: No Erythema: No Erythema: No Hemosiderin Staining: No Hemosiderin Staining: No Mottled: No Mottled: No Pallor: No Pallor: No Rubor: No Rubor: No No Abnormality No Abnormality N/A Temperature: Yes N/A N/A Tenderness on Palpation: Treatment Notes Electronic Signature(s) Signed: 06/11/2023 4:07:01 PM By: Geralyn Corwin DO Entered By: Geralyn Corwin on 06/11/2023 15:48:18 -------------------------------------------------------------------------------- Multi-Disciplinary Care Plan Details Patient Name: Date of Service: JAHZIAH, SICLARI [name] 06/11/2023 3:00 PM Medical Record Number: [id number] Patient Account Number: [id number] Date of Birth/Sex: Treating RN: 05/19/1951 (72 y.o. [name] Primary Care Coal Nearhood: [name] Other Clinician: Referring Ayan Yankey: Treating Jonathyn Carothers/Extender: Ebony Cargo in Treatment: 5 Active Inactive Wound/Skin Impairment Nursing Diagnoses: Impaired tissue integrity Goals: Patient/caregiver will verbalize understanding of skin care regimen Date Initiated: 05/05/2023 Target Resolution Date: 07/24/2023 Goal Status: Active Interventions: Assess patient/caregiver ability to obtain necessary supplies Assess patient/caregiver ability to perform ulcer/skin care regimen upon admission and as needed Assess ulceration(s) every visit Provide education on ulcer and skin care Screen for HBO Treatment Activities: Skin care regimen initiated : 05/05/2023 Topical wound management initiated : 05/05/2023 Notes: Electronic Signature(s) Signed: 06/11/2023 6:06:31 PM By: Shawn Stall RN, BSN Entered By: Shawn Stall on 06/11/2023 15:33:05 -------------------------------------------------------------------------------- Pain Assessment Details Patient Name: Date of  Service: [name], [name] 06/11/2023 3:00 PM Medical Record Number: [id number] Patient Account Number: [id number] JGUADALUPE, FINNIGAN ([id number]) [id number] Page 6 of 9 Date of Birth/Sex: Treating RN: 09-30-1951 (72 y.o. M) Primary Care Perel Hauschild: [name] Other Clinician: Referring Lynda Wanninger: Treating Simren Popson/Extender: Ebony Cargo in Treatment: 5 Active Problems Location of Pain Severity and Description of Pain Patient Has Paino No Site Locations Pain Management and Medication Current Pain Management: Electronic Signature(s) Signed: 06/12/2023 2:31:37 PM By: Thayer Dallas Entered By: Thayer Dallas on 06/11/2023 15:18:10 -------------------------------------------------------------------------------- Patient/Caregiver Education Details Patient Name: Date of Service: [name], [name] 6/20/2024andnbsp3:00 PM Medical Record Number: [id number] Patient Account Number: [id number] Date of Birth/Gender: Treating RN: 16-Apr-1951 (72 y.o. [name] Primary Care Physician: [name] Other Clinician: Referring Physician: Treating Physician/Extender: Ebony Cargo in Treatment: 5 Education Assessment Education Provided To: Patient Education Topics Provided Wound/Skin Impairment: Handouts: Caring for Your Ulcer Methods: Explain/Verbal Responses: Reinforcements needed Electronic Signature(s) Signed: 06/11/2023 6:06:31 PM By: Shawn Stall RN, BSN Entered By: Shawn Stall on 06/11/2023 15:33:15 Stith, Jacqlyn Krauss ([id number]) [id number]  Page 7 of 9 -------------------------------------------------------------------------------- Wound Assessment Details Patient Name: Date of Service: [name], [name] 06/11/2023 3:00 PM Medical Record Number: [id number] Patient Account Number: [id number] Date of Birth/Sex: Treating RN: Aug 01, 1951 (72 y.o. M) Primary Care Janicia Monterrosa: [name]  Other Clinician: Referring Mary-Anne Polizzi: Treating Topanga Alvelo/Extender: Ebony Cargo in Treatment: 5 Wound Status Wound Number: 1 Primary Etiology: Diabetic Wound/Ulcer of the Lower Extremity Wound Location: Left, Anterior Lower Leg Wound Status: Open Wounding Event: Gradually Appeared Comorbid Cataracts, Hypertension, Type II Diabetes, Seizure History: Disorder Date Acquired: 04/28/2023 Weeks Of Treatment: 5 Clustered Wound: Yes Photos Wound Measurements Length: (cm) Width: (cm) Depth: (cm) Clustered Quantity: Area: (cm) Volume: (cm) 7 % Reduction in Area: 87.5% 5.1 % Reduction in Volume: 87.5% 0.1 Epithelialization: Small (1-33%) 2 Tunneling: No 28.039 Undermining: No 2.804 Wound Description Classification: Grade 2 Wound Margin: Distinct, outline attached Exudate Amount: Medium Exudate Type: Serosanguineous Exudate Color: red, brown Foul Odor After Cleansing: No Slough/Fibrino Yes Wound Bed Granulation Amount: Large (67-100%) Exposed Structure Granulation Quality: Pink, Pale Fat Layer (Subcutaneous Tissue) Exposed: Yes Necrotic Amount: Small (1-33%) Necrotic Quality: Adherent Slough Periwound Skin Texture Texture Color No Abnormalities Noted: No No Abnormalities Noted: No Callus: Yes Atrophie Blanche: No Crepitus: No Cyanosis: No Excoriation: No Ecchymosis: No Induration: No Erythema: No Rash: No Hemosiderin Staining: No Scarring: Yes Mottled: No Pallor: No Moisture Rubor: No No Abnormalities Noted: No Dry / Scaly: Yes Temperature / Pain Maceration: No Temperature: No Abnormality Tenderness on Palpation: Yes [name], [name] ([id number]) [id number] Page 8 of 9 Electronic Signature(s) Signed: 06/12/2023 2:31:37 PM By: Thayer Dallas Entered By: Thayer Dallas on 06/11/2023 15:25:36 -------------------------------------------------------------------------------- Wound Assessment Details Patient Name: Date  of Service: [name], [name] 06/11/2023 3:00 PM Medical Record Number: [id number] Patient Account Number: [id number] Date of Birth/Sex: Treating RN: Oct 15, 1951 (72 y.o. M) Primary Care Lathen Seal: [name] Other Clinician: Referring Damaris Geers: Treating Zorana Brockwell/Extender: Ebony Cargo in Treatment: 5 Wound Status Wound Number: 2 Primary Etiology: Diabetic Wound/Ulcer of the Lower Extremity Wound Location: Right, Lateral Lower Leg Wound Status: Open Wounding Event: Gradually Appeared Comorbid Cataracts, Hypertension, Type II Diabetes, Seizure History: Disorder Date Acquired: 06/10/2023 Weeks Of Treatment: 0 Clustered Wound: No Photos Wound Measurements Length: (cm) 3.4 Width: (cm) 4.2 Depth: (cm) 0.1 Area: (cm) 11.215 Volume: (cm) 1.122 % Reduction in Area: % Reduction in Volume: Epithelialization: None Tunneling: No Undermining: No Wound Description Classification: Grade 1 Wound Margin: Distinct, outline attached Exudate Amount: Medium Exudate Type: Serosanguineous Exudate Color: red, brown Foul Odor After Cleansing: No Slough/Fibrino Yes Wound Bed Granulation Amount: Large (67-100%) Exposed Structure Granulation Quality: Red Fascia Exposed: No Necrotic Amount: None Present (0%) Fat Layer (Subcutaneous Tissue) Exposed: Yes Tendon Exposed: No Muscle Exposed: No Joint Exposed: No Bone Exposed: No Periwound Skin Texture Texture Color No Abnormalities Noted: No No Abnormalities Noted: No Callus: No Atrophie Blanche: No Crepitus: No Cyanosis: No Excoriation: No Ecchymosis: No Induration: No Erythema: No Rash: No Hemosiderin Staining: No Riches, Romond ([id number]) [id number] Page 9 of 9 Scarring: No Mottled: No Pallor: No Moisture Rubor: No No Abnormalities Noted: No Dry / Scaly: No Temperature / Pain Maceration: No Temperature: No Abnormality Electronic Signature(s) Signed: 06/11/2023 6:06:31 PM By:  Shawn Stall RN, BSN Entered By: Shawn Stall on 06/11/2023 15:32:46 -------------------------------------------------------------------------------- Vitals Details Patient Name: Date of Service: RAVIN, EDLER [name] 06/11/2023 3:00 PM Medical Record Number: [id number] Patient Account Number: [id number] Date of Birth/Sex: Treating RN: [birth year] (72 y.o. M) Primary Care Danella Philson: [name] Other Clinician: Referring Journey Ratterman:  Treating Mckinley Olheiser/Extender: Ebony Cargo in Treatment: 5 Vital Signs Time Taken: 15:17 Temperature (F): 97.7 Height (in): 67 Pulse (bpm): 61 Weight (lbs): 193 Respiratory Rate (breaths/min): 18 Body Mass Index (BMI): 30.2 Blood Pressure (mmHg): 119/57 Reference Range: 80 - 120 mg / dl Electronic Signature(s) Signed: 06/12/2023 2:31:37 PM By: Thayer Dallas Entered By: Thayer Dallas on 06/11/2023 15:18:04

## 2023-06-15 ENCOUNTER — Ambulatory Visit (INDEPENDENT_AMBULATORY_CARE_PROVIDER_SITE_OTHER): Payer: Medicare HMO

## 2023-06-15 DIAGNOSIS — R55 Syncope and collapse: Secondary | ICD-10-CM

## 2023-06-15 LAB — CUP PACEART REMOTE DEVICE CHECK
Date Time Interrogation Session: 20240623231135
Implantable Pulse Generator Implant Date: 20210201

## 2023-06-16 ENCOUNTER — Encounter (HOSPITAL_BASED_OUTPATIENT_CLINIC_OR_DEPARTMENT_OTHER): Payer: Medicare HMO | Admitting: Internal Medicine

## 2023-06-16 DIAGNOSIS — E11622 Type 2 diabetes mellitus with other skin ulcer: Secondary | ICD-10-CM

## 2023-06-16 DIAGNOSIS — I87313 Chronic venous hypertension (idiopathic) with ulcer of bilateral lower extremity: Secondary | ICD-10-CM | POA: Diagnosis not present

## 2023-06-16 DIAGNOSIS — S81801A Unspecified open wound, right lower leg, initial encounter: Secondary | ICD-10-CM

## 2023-06-16 DIAGNOSIS — S81802A Unspecified open wound, left lower leg, initial encounter: Secondary | ICD-10-CM

## 2023-06-16 NOTE — Progress Notes (Signed)
[name] ([id number]) [id number] Page 1 of 8 Visit Report for 06/16/2023 Chief Complaint Document Details Patient Name: Date of Service: [name], [name] ESTER 06/16/2023 11:00 A M Medical Record Number: [id number] Patient Account Number: [id number] Date of Birth/Sex: Treating RN: [birth year] (72 y.o. M) Primary Care Provider: Fatima [name] Other Clinician: Referring Provider: Treating Provider/Extender: [name] in Treatment: 6 Information Obtained from: Patient Chief Complaint 05/05/2023; left lower extremity wound Electronic Signature(s) Signed: 06/16/2023 4:25:00 PM By: [name] Corwin DO Entered By: [name] on 06/16/2023 13:36:00 -------------------------------------------------------------------------------- HPI Details Patient Name: Date of Service: [name], [name] ESTER 06/16/2023 11:00 A M Medical Record Number: [id number] Patient Account Number: [id number] Date of Birth/Sex: Treating RN: 12-16-1951 (72 y.o. M) Primary Care Provider: Fatima [name] Other Clinician: Referring Provider: Treating Provider/Extender: [name] in Treatment: 6 History of Present Illness HPI Description: CONSULT ONLY 04/28/2022 This is a 72 year old man with a past medical history notable for type 2 diabetes mellitus, stroke, and lower extremity edema. Apparently back in January, he did have a wound on his right lower extremity and was seen in the emergency department for this. I am not entirely sure how he ultimately was referred to the wound care center, but today he has no open wounds. He does have significant bilateral lower extremity edema, 2+ up to the knees. He does not wear compression stockings. He says that he applies Vaseline to his legs daily, but the skin is quite dry and scaly. ABIs in clinic today were 0.79 and 0.84. 05/05/2023 [name] is a 72 year old male with a past medical history of  cryptogenic right MCA stroke in 2021, chronic combined systolic and diastolic congestive heart failure, controlled type 2 diabetes on oral agents and lymphedema/chronic venous insufficiency that presents the clinic for a 1 week history of wound to his left lower extremity. He states it started as a blister and has ruptured creating the wound. He has been keeping the area covered. He does not wear compression stockings. He reports taking his Lasix as prescribed but has recently run out over the past day or 2. They are waiting on a refill at the pharmacy. Currently denies signs of infection. Denies shortness of breath or orthopnea. 5/21; patient presents for follow-up. We have been using Xeroform with antibiotic ointment under Kerlix/Coban to the left lower extremity. He has tolerated this well. He has no issues or complaints today. 5/30; patient presents for follow up. We have been using Xeroform and anitbiotic ointment under Kerlix/lower extremity. He took the wrap off due to increased itching to the periwound. Wounds are much smaller. No signs of infection. 6/7; we have been using Xeroform topical antibiotic under kerlix Coban. Unfortunately the patient simply will not leave the wrap in place. He came in with this rolled around his ankle. He says it is "itchy" his swelling is not well-controlled. 6/20; patient presents for follow-up. The dressing was changed at last clinic visit to Tubigrip and Hydrofera Blue to the left leg. This is much larger today. Unfortunately has developed a skin tear vs blister to the right lateral leg. 6/25; patient presents for follow-up. We have been using antibiotic ointment with Xeroform under Kerlix/Coban to the lower extremities bilaterally. Wounds are smaller. Unfortunately he keeps trying to take the wraps off. We are trying to get him home health but have not heard back yet. [name] ([id number]) [id number] Page 2 of  8 Electronic Signature(s) Signed: 06/16/2023 4:25:00 PM By: [name] Corwin DO Entered By:  [name] on 06/16/2023 13:37:05 -------------------------------------------------------------------------------- Physical Exam Details Patient Name: Date of Service: [name], [name] ESTER 06/16/2023 11:00 A M Medical Record Number: [id number] Patient Account Number: [id number] Date of Birth/Sex: Treating RN: [birth year] (72 y.o. M) Primary Care Provider: Fatima [name] Other Clinician: Referring Provider: Treating Provider/Extender: [name] in Treatment: 6 Constitutional respirations regular, non-labored and within target range for patient.. Cardiovascular 2+ dorsalis pedis/posterior tibialis pulses. Psychiatric pleasant and cooperative. Notes T the left lower extremity there is an open wound to the anterior aspect with granulation tissue throughout. T the right lower extremity there is an open wound o o to the lateral aspect with granulation tissue present. No signs of surrounding infection. 2+ pitting edema to the knees bilaterally. Electronic Signature(s) Signed: 06/16/2023 4:25:00 PM By: [name] Corwin DO Entered By: [name] on 06/16/2023 13:41:26 -------------------------------------------------------------------------------- Physician Orders Details Patient Name: Date of Service: [name], [name] ESTER 06/16/2023 11:00 A M Medical Record Number: [id number] Patient Account Number: [id number] Date of Birth/Sex: Treating RN: 11-09-1951 (72 y.o. M) Primary Care Provider: Fatima [name] Other Clinician: Referring Provider: Treating Provider/Extender: [name] in Treatment: 6 Verbal / Phone Orders: No Diagnosis Coding Follow-up Appointments ppointment in 1 week. - Dr. Mikey Bussing Monday 06/22/2023 330pm room 9 Return A Nurse Visit: - change dressings Tuesday 06/23/23 Other: - Will try to get home health started twice a week. Bathing/  Shower/ Hygiene May shower with protection but do not get wound dressing(s) wet. Protect dressing(s) with water repellant cover (for example, large plastic bag) or a cast cover and may then take shower. Edema Control - Lymphedema / SCD / Other Bilateral Lower Extremities Elevate legs to the level of the heart or above for 30 minutes daily and/or when sitting for 3-4 times a day throughout the day. A void standing for long periods of time. If compression wraps slide down please call wound center and speak with a nurse. Home Health Admit to Home Health for skilled nursing wound care. May utilize formulary equivalent dressing for wound treatment orders unless otherwise specified. [name], [name] ([id number]) [id number] Page 3 of 8 No change in wound care orders this week; continue Home Health for wound care. May utilize formulary equivalent dressing for wound treatment orders unless otherwise specified. - As of 06/16/23-Wednesday and Friday dressing changes. xeroform kerlix and Coban to bilateral lower legs. New wound care orders this week; continue Home Health for wound care. May utilize formulary equivalent dressing for wound treatment orders unless otherwise specified. - Wednesday and Friday dressing changes. xeroform kerlix and coban to bilateral lower legs. Wound Treatment Wound #1 - Lower Leg Wound Laterality: Left, Anterior Cleanser: Soap and Water 3 x Per Week/30 Days Discharge Instructions: May shower and wash wound with dial antibacterial soap and water prior to dressing change. Peri-Wound Care: Sween Lotion (Moisturizing lotion) 3 x Per Week/30 Days Discharge Instructions: Apply moisturizing lotion as directed Topical: Gentamicin 3 x Per Week/30 Days Discharge Instructions: applied in clinic. Topical: Mupirocin Ointment 3 x Per Week/30 Days Discharge Instructions: applied in clinic. Prim Dressing: Xeroform Occlusive Gauze Dressing, 4x4 in 3 x Per  Week/30 Days ary Discharge Instructions: Apply to wound bed as instructed Secondary Dressing: ABD Pad, 8x10 3 x Per Week/30 Days Discharge Instructions: Apply over primary dressing as directed. Compression Wrap: Kerlix Roll 4.5x3.1 (in/yd) 3 x Per Week/30 Days Discharge Instructions: Apply Kerlix and Coban compression as directed. Compression Wrap: Coban Self-Adherent Wrap 4x5 (in/yd) 3 x Per Week/30 Days Discharge Instructions: Apply over  Kerlix as directed. Wound #2 - Lower Leg Wound Laterality: Right, Lateral Cleanser: Soap and Water 3 x Per Week/30 Days Discharge Instructions: May shower and wash wound with dial antibacterial soap and water prior to dressing change. Peri-Wound Care: Sween Lotion (Moisturizing lotion) 3 x Per Week/30 Days Discharge Instructions: Apply moisturizing lotion as directed Topical: Gentamicin 3 x Per Week/30 Days Discharge Instructions: applied in clinic. Topical: Mupirocin Ointment 3 x Per Week/30 Days Discharge Instructions: applied in clinic. Prim Dressing: Xeroform Occlusive Gauze Dressing, 4x4 in 3 x Per Week/30 Days ary Discharge Instructions: Apply to wound bed as instructed Secondary Dressing: ABD Pad, 8x10 3 x Per Week/30 Days Discharge Instructions: Apply over primary dressing as directed. Compression Wrap: Kerlix Roll 4.5x3.1 (in/yd) 3 x Per Week/30 Days Discharge Instructions: Apply Kerlix and Coban compression as directed. Compression Wrap: Coban Self-Adherent Wrap 4x5 (in/yd) 3 x Per Week/30 Days Discharge Instructions: Apply over Kerlix as directed. Electronic Signature(s) Signed: 06/16/2023 4:25:00 PM By: [name] Corwin DO Entered By: [name] on 06/16/2023 13:41:37 -------------------------------------------------------------------------------- Problem List Details Patient Name: Date of Service: TRAYQUAN, KOLAKOWSKI ESTER 06/16/2023 11:00 A M Medical Record Number: [id number] Patient Account Number: [id number] Date of Birth/Sex:  Treating RN: September 16, 1951 (72 y.o. M) Primary Care Provider: Fatima [name] Other Clinician: [place], Jacqlyn Krauss ([id number]) [id number] Page 4 of 8 Referring Provider: Treating Provider/Extender: [name] in Treatment: 6 Active Problems ICD-10 Encounter Code Description Active Date MDM Diagnosis S81.802A Unspecified open wound, left lower leg, initial encounter 05/05/2023 No Yes S81.801A Unspecified open wound, right lower leg, initial encounter 06/11/2023 No Yes I87.313 Chronic venous hypertension (idiopathic) with ulcer of bilateral lower extremity 06/11/2023 No Yes I89.0 Lymphedema, not elsewhere classified 05/05/2023 No Yes E11.622 Type 2 diabetes mellitus with other skin ulcer 05/05/2023 No Yes I50.42 Chronic combined systolic (congestive) and diastolic (congestive) heart failure 05/05/2023 No Yes Inactive Problems Resolved Problems Electronic Signature(s) Signed: 06/16/2023 4:25:00 PM By: [name] Corwin DO Entered By: [name] on 06/16/2023 13:35:46 -------------------------------------------------------------------------------- Progress Note Details Patient Name: Date of Service: [name], [name] ESTER 06/16/2023 11:00 A M Medical Record Number: [id number] Patient Account Number: [id number] Date of Birth/Sex: Treating RN: 17-Jun-1951 (72 y.o. M) Primary Care Provider: Fatima [name] Other Clinician: Referring Provider: Treating Provider/Extender: [name] in Treatment: 6 Subjective Chief Complaint Information obtained from Patient 05/05/2023; left lower extremity wound History of Present Illness (HPI) CONSULT ONLY 04/28/2022 This is a 72 year old man with a past medical history notable for type 2 diabetes mellitus, stroke, and lower extremity edema. Apparently back in January, he did have a wound on his right lower extremity and was seen in the emergency department for this. I am not entirely sure how  he ultimately was referred to the wound care center, but today he has no open wounds. He does have significant bilateral lower extremity edema, 2+ up to the knees. He does not wear compression stockings. He says that he applies Vaseline to his legs daily, but the skin is quite dry and scaly. ABIs in clinic today were 0.79 and 0.84. 05/05/2023 Mr. Marquavion Venhuizen is a 72 year old male with a past medical history of cryptogenic right MCA stroke in 2021, chronic combined systolic and diastolic congestive heart failure, controlled type 2 diabetes on oral agents and lymphedema/chronic venous insufficiency that presents the clinic for a 1 week history [name], [name] ([id number]) [phone].pdf Page 5 of 8 of wound to his left lower extremity. He states it started as a blister and has ruptured creating the wound.  He has been keeping the area covered. He does not wear compression stockings. He reports taking his Lasix as prescribed but has recently run out over the past day or 2. They are waiting on a refill at the pharmacy. Currently denies signs of infection. Denies shortness of breath or orthopnea. 5/21; patient presents for follow-up. We have been using Xeroform with antibiotic ointment under Kerlix/Coban to the left lower extremity. He has tolerated this well. He has no issues or complaints today. 5/30; patient presents for follow up. We have been using Xeroform and anitbiotic ointment under Kerlix/lower extremity. He took the wrap off due to increased itching to the periwound. Wounds are much smaller. No signs of infection. 6/7; we have been using Xeroform topical antibiotic under kerlix Coban. Unfortunately the patient simply will not leave the wrap in place. He came in with this rolled around his ankle. He says it is "itchy" his swelling is not well-controlled. 6/20; patient presents for follow-up. The dressing was changed at last clinic visit to Tubigrip and Hydrofera Blue  to the left leg. This is much larger today. Unfortunately has developed a skin tear vs blister to the right lateral leg. 6/25; patient presents for follow-up. We have been using antibiotic ointment with Xeroform under Kerlix/Coban to the lower extremities bilaterally. Wounds are smaller. Unfortunately he keeps trying to take the wraps off. We are trying to get him home health but have not heard back yet. Patient History Information obtained from Patient, Chart. Family History Unknown History, Cancer - Father, Diabetes - Mother, Lung Disease - Mother. Social History Never smoker, Marital Status - Single, Alcohol Use - Never, Drug Use - No History, Caffeine Use - Daily - coffee. Medical History Eyes Patient has history of Cataracts Denies history of Glaucoma, Optic Neuritis Cardiovascular Patient has history of Hypertension Endocrine Patient has history of Type II Diabetes Integumentary (Skin) Denies history of History of Burn Neurologic Patient has history of Seizure Disorder - CVA Denies history of Dementia, Neuropathy, Quadriplegia, Paraplegia Hospitalization/Surgery History - Loop Recorder insertion 01/23/2020. Medical A Surgical History Notes nd Eyes Diabetic Retinopathy Hypertensive Retinopathy Left Homonymous Hemianopsia Cardiovascular Acute Cerebrovascular Accident Neurologic Memory Change Objective Constitutional respirations regular, non-labored and within target range for patient.. Vitals Time Taken: 11:31 AM, Height: 67 in, Weight: 193 lbs, BMI: 30.2, Temperature: 98.9 F, Pulse: 52 bpm, Respiratory Rate: 18 breaths/min, Blood Pressure: 131/66 mmHg. Cardiovascular 2+ dorsalis pedis/posterior tibialis pulses. Psychiatric pleasant and cooperative. General Notes: T the left lower extremity there is an open wound to the anterior aspect with granulation tissue throughout. T the right lower extremity there is o o an open wound to the lateral aspect with granulation tissue  present. No signs of surrounding infection. 2+ pitting edema to the knees bilaterally. Integumentary (Hair, Skin) Wound #1 status is Open. Original cause of wound was Gradually Appeared. The date acquired was: 04/28/2023. The wound has been in treatment 6 weeks. The wound is located on the Left,Anterior Lower Leg. The wound measures 6cm length x 4.5cm width x 0.1cm depth; 21.206cm^2 area and 2.121cm^3 volume. There is Fat Layer (Subcutaneous Tissue) exposed. There is no tunneling or undermining noted. There is a medium amount of serosanguineous drainage noted. The wound margin is distinct with the outline attached to the wound base. There is large (67-100%) pink, pale granulation within the wound bed. There is a small (1-33%) amount of necrotic tissue within the wound bed including Adherent Slough. The periwound skin appearance exhibited: Callus, Scarring, Dry/Scaly. The periwound skin  appearance did not exhibit: Crepitus, Excoriation, Induration, Rash, Maceration, Atrophie Blanche, Cyanosis, Ecchymosis, Hemosiderin Staining, Mottled, Pallor, Rubor, Erythema. Periwound temperature was noted as No Abnormality. The periwound has tenderness on palpation. Wound #2 status is Open. Original cause of wound was Gradually Appeared. The date acquired was: 06/10/2023. The wound is located on the [address] [place], [place] ([id number]) [id number] Page 6 of 8 Leg. The wound measures 4.3cm length x 3cm width x 0.1cm depth; 10.132cm^2 area and 1.013cm^3 volume. There is Fat Layer (Subcutaneous Tissue) exposed. There is no tunneling or undermining noted. There is a medium amount of serosanguineous drainage noted. The wound margin is distinct with the outline attached to the wound base. There is large (67-100%) red granulation within the wound bed. There is no necrotic tissue within the wound bed. The periwound skin appearance did not exhibit: Callus, Crepitus, Excoriation,  Induration, Rash, Scarring, Dry/Scaly, Maceration, Atrophie Blanche, Cyanosis, Ecchymosis, Hemosiderin Staining, Mottled, Pallor, Rubor, Erythema. Periwound temperature was noted as No Abnormality. Assessment Active Problems ICD-10 Unspecified open wound, left lower leg, initial encounter Unspecified open wound, right lower leg, initial encounter Chronic venous hypertension (idiopathic) with ulcer of bilateral lower extremity Lymphedema, not elsewhere classified Type 2 diabetes mellitus with other skin ulcer Chronic combined systolic (congestive) and diastolic (congestive) heart failure Patient's wounds appear well-healing. I recommended continue the course with antibiotic ointment and Xeroform under Kerlix/Coban to the lower extremities bilaterally. No signs of soft tissue infection on exam. Unfortunately patient has underlying dementia that interferes with keeping the wraps on. We have tried it without the wraps however wounds do decline in size and appearance when this happens. We are trying to get him home health as this will help address this issue. For the most part he is able to tolerate the wraps enough to show improvement. He expresses no issues today. Follow-up in 1 week. Plan Follow-up Appointments: Return Appointment in 1 week. - Dr. Mikey Bussing Monday 06/22/2023 330pm room 9 Nurse Visit: - change dressings Tuesday 06/23/23 Other: - Will try to get home health started twice a week. Bathing/ Shower/ Hygiene: May shower with protection but do not get wound dressing(s) wet. Protect dressing(s) with water repellant cover (for example, large plastic bag) or a cast cover and may then take shower. Edema Control - Lymphedema / SCD / Other: Elevate legs to the level of the heart or above for 30 minutes daily and/or when sitting for 3-4 times a day throughout the day. Avoid standing for long periods of time. If compression wraps slide down please call wound center and speak with a nurse. Home  Health: Admit to Home Health for skilled nursing wound care. May utilize formulary equivalent dressing for wound treatment orders unless otherwise specified. No change in wound care orders this week; continue Home Health for wound care. May utilize formulary equivalent dressing for wound treatment orders unless otherwise specified. - As of 06/16/23-Wednesday and Friday dressing changes. xeroform kerlix and Coban to bilateral lower legs. New wound care orders this week; continue Home Health for wound care. May utilize formulary equivalent dressing for wound treatment orders unless otherwise specified. - Wednesday and Friday dressing changes. xeroform kerlix and coban to bilateral lower legs. WOUND #1: - Lower Leg Wound Laterality: Left, Anterior Cleanser: Soap and Water 3 x Per Week/30 Days Discharge Instructions: May shower and wash wound with dial antibacterial soap and water prior to dressing change. Peri-Wound Care: Sween Lotion (Moisturizing lotion) 3 x Per Week/30 Days Discharge Instructions: Apply moisturizing lotion as directed Topical:  Gentamicin 3 x Per Week/30 Days Discharge Instructions: applied in clinic. Topical: Mupirocin Ointment 3 x Per Week/30 Days Discharge Instructions: applied in clinic. Prim Dressing: Xeroform Occlusive Gauze Dressing, 4x4 in 3 x Per Week/30 Days ary Discharge Instructions: Apply to wound bed as instructed Secondary Dressing: ABD Pad, 8x10 3 x Per Week/30 Days Discharge Instructions: Apply over primary dressing as directed. Com pression Wrap: Kerlix Roll 4.5x3.1 (in/yd) 3 x Per Week/30 Days Discharge Instructions: Apply Kerlix and Coban compression as directed. Com pression Wrap: Coban Self-Adherent Wrap 4x5 (in/yd) 3 x Per Week/30 Days Discharge Instructions: Apply over Kerlix as directed. WOUND #2: - Lower Leg Wound Laterality: Right, Lateral Cleanser: Soap and Water 3 x Per Week/30 Days Discharge Instructions: May shower and wash wound with dial  antibacterial soap and water prior to dressing change. Peri-Wound Care: Sween Lotion (Moisturizing lotion) 3 x Per Week/30 Days Discharge Instructions: Apply moisturizing lotion as directed Topical: Gentamicin 3 x Per Week/30 Days Discharge Instructions: applied in clinic. Topical: Mupirocin Ointment 3 x Per Week/30 Days Discharge Instructions: applied in clinic. Prim Dressing: Xeroform Occlusive Gauze Dressing, 4x4 in 3 x Per Week/30 Days ary Discharge Instructions: Apply to wound bed as instructed Secondary Dressing: ABD Pad, 8x10 3 x Per Week/30 Days Discharge Instructions: Apply over primary dressing as directed. Com pression Wrap: Kerlix Roll 4.5x3.1 (in/yd) 3 x Per Week/30 Days Discharge Instructions: Apply Kerlix and Coban compression as directed. Com pression Wrap: Coban Self-Adherent Wrap 4x5 (in/yd) 3 x Per Week/30 Days Discharge Instructions: Apply over Kerlix as directed. 1. Xeroform and antibiotic ointment under Kerlix/Coban to the lower extremities bilaterally [name], [name] ([id number]) [phone].pdf Page 7 of 8 2. Follow-up in 1 week Electronic Signature(s) Signed: 06/16/2023 4:25:00 PM By: [name] Corwin DO Entered By: [name] on 06/16/2023 13:44:11 -------------------------------------------------------------------------------- HxROS Details Patient Name: Date of Service: [name], [name] ESTER 06/16/2023 11:00 A M Medical Record Number: [id number] Patient Account Number: [id number] Date of Birth/Sex: Treating RN: 16-Feb-1951 (72 y.o. M) Primary Care Provider: Fatima [name] Other Clinician: Referring Provider: Treating Provider/Extender: [name] in Treatment: 6 Information Obtained From Patient Chart Eyes Medical History: Positive for: Cataracts Negative for: Glaucoma; Optic Neuritis Past Medical History Notes: Diabetic Retinopathy Hypertensive Retinopathy Left Homonymous  Hemianopsia Cardiovascular Medical History: Positive for: Hypertension Past Medical History Notes: Acute Cerebrovascular Accident Endocrine Medical History: Positive for: Type II Diabetes Treated with: Oral agents Blood sugar tested every day: Yes Tested : every day Integumentary (Skin) Medical History: Negative for: History of Burn Neurologic Medical History: Positive for: Seizure Disorder - CVA Negative for: Dementia; Neuropathy; Quadriplegia; Paraplegia Past Medical History Notes: Memory Change HBO Extended History Items Eyes: Cataracts Immunizations Pneumococcal Vaccine: Received Pneumococcal Vaccination: No Implantable Devices None [name], [name] ([id number]) [phone].pdf Page 8 of 8 Hospitalization / Surgery History Type of Hospitalization/Surgery Loop Recorder insertion 01/23/2020 Family and Social History Unknown History: Yes; Cancer: Yes - Father; Diabetes: Yes - Mother; Lung Disease: Yes - Mother; Never smoker; Marital Status - Single; Alcohol Use: Never; Drug Use: No History; Caffeine Use: Daily - coffee; Financial Concerns: No; Food, Clothing or Shelter Needs: No; Support System Lacking: No; Transportation Concerns: No Electronic Signature(s) Signed: 06/16/2023 4:25:00 PM By: [name] Corwin DO Entered By: [name] on 06/16/2023 13:37:11 -------------------------------------------------------------------------------- SuperBill Details Patient Name: Date of Service: [name], [name] ESTER 06/16/2023 Medical Record Number: [id number] Patient Account Number: [id number] Date of Birth/Sex: Treating RN: 1951-10-07 (72 y.o. Valma Cava Primary Care Provider: Fatima [name] Other Clinician: Referring Provider: Treating Provider/Extender: Mikey Bussing  Carley Hammed, Fred Weeks in Treatment: 6 Diagnosis Coding ICD-10 Codes Code Description 2526131382 Unspecified open wound, left lower leg, initial encounter S81.801A Unspecified open  wound, right lower leg, initial encounter I87.313 Chronic venous hypertension (idiopathic) with ulcer of bilateral lower extremity I89.0 Lymphedema, not elsewhere classified E11.622 Type 2 diabetes mellitus with other skin ulcer I50.42 Chronic combined systolic (congestive) and diastolic (congestive) heart failure Facility Procedures : CPT4 Code: [id number] Description: 99213 - WOUND CARE VISIT-LEV 3 EST PT Modifier: 25 Quantity: 1 Physician Procedures : CPT4 Code Description Modifier [id number] 99213 - WC PHYS LEVEL 3 - EST PT ICD-10 Diagnosis Description S81.802A Unspecified open wound, left lower leg, initial encounter S81.801A Unspecified open wound, right lower leg, initial encounter I87.313 Chronic  venous hypertension (idiopathic) with ulcer of bilateral lower extremity E11.622 Type 2 diabetes mellitus with other skin ulcer Quantity: 1 Electronic Signature(s) Signed: 06/16/2023 4:25:00 PM By: [name] Corwin DO Previous Signature: 06/16/2023 12:35:10 PM Version By: Tommie Ard RN Entered By: [name] on 06/16/2023 13:44:30

## 2023-06-18 ENCOUNTER — Ambulatory Visit: Payer: Medicare HMO | Admitting: Podiatry

## 2023-06-19 NOTE — Progress Notes (Signed)
Laury, Sanford ([id number]) [id number] Page 1 of 11 Visit Report for 06/16/2023 Arrival Information Details Patient Name: Date of Service: SHLOAK, GARVEY ESTER 06/16/2023 11:00 A M Medical Record Number: [id number] Patient Account Number: [id number] Date of Birth/Sex: Treating RN: November 11, 1951 (72 y.o. M) Primary Care Brynlynn Walko: [name] Other Clinician: Referring Mackenzi Krogh: Treating Ayane Delancey/Extender: Ebony Cargo in Treatment: 6 Visit Information History Since Last Visit Added or deleted any medications: No Patient Arrived: Ambulatory Any new allergies or adverse reactions: No Arrival Time: 11:29 Had a fall or experienced change in No Accompanied By: son activities of daily living that may affect Transfer Assistance: None risk of falls: Patient Identification Verified: Yes Signs or symptoms of abuse/neglect since last visito No Secondary Verification Process Completed: Yes Hospitalized since last visit: No Patient Requires Transmission-Based Precautions: No Implantable device outside of the clinic excluding No Patient Has Alerts: No cellular tissue based products placed in the center since last visit: Has Dressing in Place as Prescribed: Yes Has Compression in Place as Prescribed: No Pain Present Now: No Notes slid wraps down Electronic Signature(s) Signed: 06/19/2023 11:18:15 AM By: Thayer Dallas Entered By: Thayer Dallas on 06/16/2023 11:31:41 -------------------------------------------------------------------------------- Clinic Level of Care Assessment Details Patient Name: Date of Service: DORVIN, KREBS ESTER 06/16/2023 11:00 A M Medical Record Number: [id number] Patient Account Number: [id number] Date of Birth/Sex: Treating RN: 1951-10-09 (72 y.o. Valma Cava Primary Care Rhonda Linan: [name] Other Clinician: Referring Nykayla Marcelli: Treating Tymir Terral/Extender: Ebony Cargo in Treatment:  6 Clinic Level of Care Assessment Items TOOL 4 Quantity Score X- 1 0 Use when only an EandM is performed on FOLLOW-UP visit ASSESSMENTS - Nursing Assessment / Reassessment X- 1 10 Reassessment of Co-morbidities (includes updates in patient status) X- 1 5 Reassessment of Adherence to Treatment Plan ASSESSMENTS - Wound and Skin A ssessment / Reassessment []  - 0 Simple Wound Assessment / Reassessment - one wound X- 2 5 Complex Wound Assessment / Reassessment - multiple wounds []  - 0 Dermatologic / Skin Assessment (not related to wound area) ASSESSMENTS - Focused Assessment Woodfin, Huckleberry ([id number]) [id number] Page 2 of 11 X- 1 5 Circumferential Edema Measurements - multi extremities []  - 0 Nutritional Assessment / Counseling / Intervention []  - 0 Lower Extremity Assessment (monofilament, tuning fork, pulses) []  - 0 Peripheral Arterial Disease Assessment (using hand held doppler) ASSESSMENTS - Ostomy and/or Continence Assessment and Care []  - 0 Incontinence Assessment and Management []  - 0 Ostomy Care Assessment and Management (repouching, etc.) PROCESS - Coordination of Care X - Simple Patient / Family Education for ongoing care 1 15 []  - 0 Complex (extensive) Patient / Family Education for ongoing care X- 1 10 Staff obtains Chiropractor, Records, T Results / Process Orders est X- 1 10 Staff telephones HHA, Nursing Homes / Clarify orders / etc []  - 0 Routine Transfer to another Facility (non-emergent condition) []  - 0 Routine Hospital Admission (non-emergent condition) []  - 0 New Admissions / Manufacturing engineer / Ordering NPWT Apligraf, etc. , []  - 0 Emergency Hospital Admission (emergent condition) []  - 0 Simple Discharge Coordination []  - 0 Complex (extensive) Discharge Coordination PROCESS - Special Needs []  - 0 Pediatric / Minor Patient Management []  - 0 Isolation Patient Management []  - 0 Hearing / Language / Visual  special needs []  - 0 Assessment of Community assistance (transportation, D/C planning, etc.) []  - 0 Additional assistance / Altered mentation []  - 0 Support Surface(s) Assessment (bed, cushion, seat, etc.) INTERVENTIONS - Wound Cleansing / Measurement []  -  0 Simple Wound Cleansing - one wound X- 2 5 Complex Wound Cleansing - multiple wounds X- 1 5 Wound Imaging (photographs - any number of wounds) []  - 0 Wound Tracing (instead of photographs) []  - 0 Simple Wound Measurement - one wound X- 2 5 Complex Wound Measurement - multiple wounds INTERVENTIONS - Wound Dressings []  - 0 Small Wound Dressing one or multiple wounds X- 1 15 Medium Wound Dressing one or multiple wounds []  - 0 Large Wound Dressing one or multiple wounds []  - 0 Application of Medications - topical []  - 0 Application of Medications - injection INTERVENTIONS - Miscellaneous []  - 0 External ear exam []  - 0 Specimen Collection (cultures, biopsies, blood, body fluids, etc.) []  - 0 Specimen(s) / Culture(s) sent or taken to Lab for analysis []  - 0 Patient Transfer (multiple staff / Michiel Sites Lift / Similar devices) []  - 0 Simple Staple / Suture removal (25 or less) Belk, Tiler ([id number]) [id number] Page 3 of 11 []  - 0 Complex Staple / Suture removal (26 or more) []  - 0 Hypo / Hyperglycemic Management (close monitor of Blood Glucose) []  - 0 Ankle / Brachial Index (ABI) - do not check if billed separately X- 1 5 Vital Signs Has the patient been seen at the hospital within the last three years: Yes Total Score: 110 Level Of Care: New/Established - Level 3 Electronic Signature(s) Signed: 06/16/2023 3:59:56 PM By: Tommie Ard RN Entered By: Tommie Ard on 06/16/2023 12:35:00 -------------------------------------------------------------------------------- Encounter Discharge Information Details Patient Name: Date of Service: YISHAI, FAVRE ESTER 06/16/2023 11:00 A M Medical  Record Number: [id number] Patient Account Number: [id number] Date of Birth/Sex: Treating RN: 1951/12/03 (72 y.o. Valma Cava Primary Care Jordie Skalsky: [name] Other Clinician: Referring Vuk Skillern: Treating Casilda Pickerill/Extender: Ebony Cargo in Treatment: 6 Encounter Discharge Information Items Discharge Condition: Stable Ambulatory Status: Ambulatory Discharge Destination: Home Transportation: Private Auto Accompanied By: son Schedule Follow-up Appointment: Yes Clinical Summary of Care: Electronic Signature(s) Signed: 06/16/2023 3:59:56 PM By: Tommie Ard RN Entered By: Tommie Ard on 06/16/2023 12:07:34 -------------------------------------------------------------------------------- Lower Extremity Assessment Details Patient Name: Date of Service: OSCAR, CARPENITO ESTER 06/16/2023 11:00 A M Medical Record Number: [id number] Patient Account Number: [id number] Date of Birth/Sex: Treating RN: 10-20-1951 (72 y.o. M) Primary Care Jerilee Space: [name] Other Clinician: Referring Karysa Heft: Treating Abdulah Iqbal/Extender: Ebony Cargo in Treatment: 6 Edema Assessment Assessed: [Left: No] [Right: No] Edema: [Left: Ye] [Right: s] Calf Left: Right: Point of Measurement: From Medial Instep 40.5 cm 39.5 cm Ankle Left: Right: Point of Measurement: From Medial Instep 24.3 cm 23.8 cm Kempker, Toran ([id number]) [id number] Page 4 of 11 Electronic Signature(s) Signed: 06/19/2023 11:18:15 AM By: Thayer Dallas Entered By: Thayer Dallas on 06/16/2023 11:41:07 -------------------------------------------------------------------------------- Multi Wound Chart Details Patient Name: Date of Service: OLUWATIMILEYIN, HEMP ESTER 06/16/2023 11:00 A M Medical Record Number: [id number] Patient Account Number: [id number] Date of Birth/Sex: Treating RN: 11-03-1951 (72 y.o. M) Primary Care Mozell Hardacre: [name] Other Clinician: Referring  Janyla Biscoe: Treating Mercer Peifer/Extender: Ebony Cargo in Treatment: 6 Vital Signs Height(in): 67 Pulse(bpm): 52 Weight(lbs): 193 Blood Pressure(mmHg): 131/66 Body Mass Index(BMI): 30.2 Temperature(F): 98.9 Respiratory Rate(breaths/min): 18 [1:Photos:] [N/A:N/A] Left, Anterior Lower Leg Right, Lateral Lower Leg N/A Wound Location: Gradually Appeared Gradually Appeared N/A Wounding Event: Diabetic Wound/Ulcer of the Lower Diabetic Wound/Ulcer of the Lower N/A Primary Etiology: Extremity Extremity Cataracts, Hypertension, Type II Cataracts, Hypertension, Type II N/A Comorbid History: Diabetes, Seizure Disorder Diabetes, Seizure Disorder 04/28/2023 06/10/2023 N/A Date Acquired: 6 0  N/A Weeks of Treatment: Open Open N/A Wound Status: No No N/A Wound Recurrence: Yes No N/A Clustered Wound: 2 N/A N/A Clustered Quantity: 6x4.5x0.1 4.3x3x0.1 N/A Measurements L x W x D (cm) 21.206 10.132 N/A A (cm) : rea 2.121 1.013 N/A Volume (cm) : 90.50% 9.70% N/A % Reduction in A rea: 90.50% 9.70% N/A % Reduction in Volume: Grade 2 Grade 1 N/A Classification: Medium Medium N/A Exudate A mount: Serosanguineous Serosanguineous N/A Exudate Type: red, brown red, brown N/A Exudate Color: Distinct, outline attached Distinct, outline attached N/A Wound Margin: Large (67-100%) Large (67-100%) N/A Granulation A mount: Pink, Pale Red N/A Granulation Quality: Small (1-33%) None Present (0%) N/A Necrotic A mount: Fat Layer (Subcutaneous Tissue): Yes Fat Layer (Subcutaneous Tissue): Yes N/A Exposed Structures: Fascia: No Tendon: No Muscle: No Joint: No Bone: No Small (1-33%) None N/A Epithelialization: Callus: Yes Excoriation: No N/A Periwound Skin Texture: Scarring: Yes Induration: No Excoriation: No Callus: No Induration: No Crepitus: No Crepitus: No Rash: No Rash: No Scarring: No Dry/Scaly: Yes Maceration: No N/A Periwound Skin Moisture: Wetherby,  Domnic ([id number]) [id number] Page 5 of 11 Maceration: No Dry/Scaly: No Atrophie Blanche: No Atrophie Blanche: No N/A Periwound Skin Color: Cyanosis: No Cyanosis: No Ecchymosis: No Ecchymosis: No Erythema: No Erythema: No Hemosiderin Staining: No Hemosiderin Staining: No Mottled: No Mottled: No Pallor: No Pallor: No Rubor: No Rubor: No No Abnormality No Abnormality N/A Temperature: Yes N/A N/A Tenderness on Palpation: Treatment Notes Wound #1 (Lower Leg) Wound Laterality: Left, Anterior Cleanser Soap and Water Discharge Instruction: May shower and wash wound with dial antibacterial soap and water prior to dressing change. Peri-Wound Care Sween Lotion (Moisturizing lotion) Discharge Instruction: Apply moisturizing lotion as directed Topical Gentamicin Discharge Instruction: applied in clinic. Mupirocin Ointment Discharge Instruction: applied in clinic. Primary Dressing Xeroform Occlusive Gauze Dressing, 4x4 in Discharge Instruction: Apply to wound bed as instructed Secondary Dressing ABD Pad, 8x10 Discharge Instruction: Apply over primary dressing as directed. Secured With Compression Wrap Kerlix Roll 4.5x3.1 (in/yd) Discharge Instruction: Apply Kerlix and Coban compression as directed. Coban Self-Adherent Wrap 4x5 (in/yd) Discharge Instruction: Apply over Kerlix as directed. Compression Stockings Add-Ons Wound #2 (Lower Leg) Wound Laterality: Right, Lateral Cleanser Soap and Water Discharge Instruction: May shower and wash wound with dial antibacterial soap and water prior to dressing change. Peri-Wound Care Sween Lotion (Moisturizing lotion) Discharge Instruction: Apply moisturizing lotion as directed Topical Gentamicin Discharge Instruction: applied in clinic. Mupirocin Ointment Discharge Instruction: applied in clinic. Primary Dressing Xeroform Occlusive Gauze Dressing, 4x4 in Discharge Instruction: Apply to wound bed  as instructed Secondary Dressing ABD Pad, 8x10 Discharge Instruction: Apply over primary dressing as directed. Secured With Conway, [place] ([id number]) [id number] Page 6 of 11 Compression Wrap Kerlix Roll 4.5x3.1 (in/yd) Discharge Instruction: Apply Kerlix and Coban compression as directed. Coban Self-Adherent Wrap 4x5 (in/yd) Discharge Instruction: Apply over Kerlix as directed. Compression Stockings Add-Ons Electronic Signature(s) Signed: 06/16/2023 4:25:00 PM By: Geralyn Corwin DO Entered By: Geralyn Corwin on 06/16/2023 13:35:52 -------------------------------------------------------------------------------- Multi-Disciplinary Care Plan Details Patient Name: Date of Service: CHIPPER, GAILLARD ESTER 06/16/2023 11:00 A M Medical Record Number: [id number] Patient Account Number: [id number] Date of Birth/Sex: Treating RN: 05-06-1951 (72 y.o. M) Primary Care Katyra Tomassetti: [name] Other Clinician: Referring Khloe Hunkele: Treating Anajah Sterbenz/Extender: Ebony Cargo in Treatment: 6 Active Inactive Wound/Skin Impairment Nursing Diagnoses: Impaired tissue integrity Goals: Patient/caregiver will verbalize understanding of skin care regimen Date Initiated: 05/05/2023 Target Resolution Date: 07/24/2023 Goal Status: Active Interventions: Assess patient/caregiver ability to obtain necessary supplies Assess patient/caregiver  ability to perform ulcer/skin care regimen upon admission and as needed Assess ulceration(s) every visit Provide education on ulcer and skin care Screen for HBO Treatment Activities: Skin care regimen initiated : 05/05/2023 Topical wound management initiated : 05/05/2023 Notes: Electronic Signature(s) Signed: 06/19/2023 11:18:15 AM By: Thayer Dallas Entered By: Thayer Dallas on 06/16/2023 11:53:44 -------------------------------------------------------------------------------- Pain Assessment Details Patient Name: Date  of Service: KENNY, USMAN ESTER 06/16/2023 11:00 A M Fawver, Conard ([id number]) [id number] Page 7 of 11 Medical Record Number: [id number] Patient Account Number: [id number] Date of Birth/Sex: Treating RN: [birth year] (72 y.o. M) Primary Care Nathania Waldman: [name] Other Clinician: Referring Leala Bryand: Treating Kamira Mellette/Extender: Ebony Cargo in Treatment: 6 Active Problems Location of Pain Severity and Description of Pain Patient Has Paino No Site Locations Pain Management and Medication Current Pain Management: Electronic Signature(s) Signed: 06/19/2023 11:18:15 AM By: Thayer Dallas Entered By: Thayer Dallas on 06/16/2023 11:32:06 -------------------------------------------------------------------------------- Patient/Caregiver Education Details Patient Name: Date of Service: Krystal Eaton ESTER 6/25/2024andnbsp11:00 A M Medical Record Number: [id number] Patient Account Number: [id number] Date of Birth/Gender: Treating RN: 04-11-1951 (72 y.o. M) Primary Care Physician: [name] Other Clinician: Referring Physician: Treating Physician/Extender: Ebony Cargo in Treatment: 6 Education Assessment Education Provided To: Patient Education Topics Provided Wound Debridement: Methods: Explain/Verbal Responses: Reinforcements needed Wound/Skin Impairment: Methods: Explain/Verbal Responses: Reinforcements needed, State content correctly Electronic Signature(s) Signed: 06/19/2023 11:18:15 AM By: Thayer Dallas Entered By: Thayer Dallas on 06/16/2023 11:54:02 Cohenour, Jacqlyn Krauss ([id number]) [id number] Page 8 of 11 -------------------------------------------------------------------------------- Wound Assessment Details Patient Name: Date of Service: ACIE, ALAVI ESTER 06/16/2023 11:00 A M Medical Record Number: [id number] Patient Account Number: [id number] Date of Birth/Sex: Treating  RN: 21-Oct-1951 (72 y.o. M) Primary Care Davien Malone: [name] Other Clinician: Referring Drayven Marchena: Treating Emmanuell Kantz/Extender: Ebony Cargo in Treatment: 6 Wound Status Wound Number: 1 Primary Etiology: Diabetic Wound/Ulcer of the Lower Extremity Wound Location: Left, Anterior Lower Leg Wound Status: Open Wounding Event: Gradually Appeared Comorbid Cataracts, Hypertension, Type II Diabetes, Seizure History: Disorder Date Acquired: 04/28/2023 Weeks Of Treatment: 6 Clustered Wound: Yes Photos Wound Measurements Length: (cm) Width: (cm) Depth: (cm) Clustered Quantity: Area: (cm) Volume: (cm) 6 % Reduction in Area: 90.5% 4.5 % Reduction in Volume: 90.5% 0.1 Epithelialization: Small (1-33%) 2 Tunneling: No 21.206 Undermining: No 2.121 Wound Description Classification: Grade 2 Wound Margin: Distinct, outline attached Exudate Amount: Medium Exudate Type: Serosanguineous Exudate Color: red, brown Foul Odor After Cleansing: No Slough/Fibrino Yes Wound Bed Granulation Amount: Large (67-100%) Exposed Structure Granulation Quality: Pink, Pale Fat Layer (Subcutaneous Tissue) Exposed: Yes Necrotic Amount: Small (1-33%) Necrotic Quality: Adherent Slough Periwound Skin Texture Texture Color No Abnormalities Noted: No No Abnormalities Noted: No Callus: Yes Atrophie Blanche: No Crepitus: No Cyanosis: No Excoriation: No Ecchymosis: No Induration: No Erythema: No Rash: No Hemosiderin Staining: No Scarring: Yes Mottled: No Pallor: No Moisture Rubor: No No Abnormalities Noted: No Dry / Scaly: Yes Temperature / Pain Maceration: No Temperature: No Abnormality Ardizzone, Luiz ([id number]) [id number] Page 9 of 11 Tenderness on Palpation: Yes Treatment Notes Wound #1 (Lower Leg) Wound Laterality: Left, Anterior Cleanser Soap and Water Discharge Instruction: May shower and wash wound with dial antibacterial soap and water  prior to dressing change. Peri-Wound Care Sween Lotion (Moisturizing lotion) Discharge Instruction: Apply moisturizing lotion as directed Topical Gentamicin Discharge Instruction: applied in clinic. Mupirocin Ointment Discharge Instruction: applied in clinic. Primary Dressing Xeroform Occlusive Gauze Dressing, 4x4 in Discharge Instruction: Apply to wound bed as instructed Secondary Dressing ABD Pad, 8x10 Discharge Instruction:  Apply over primary dressing as directed. Secured With Compression Wrap Kerlix Roll 4.5x3.1 (in/yd) Discharge Instruction: Apply Kerlix and Coban compression as directed. Coban Self-Adherent Wrap 4x5 (in/yd) Discharge Instruction: Apply over Kerlix as directed. Compression Stockings Add-Ons Electronic Signature(s) Signed: 06/19/2023 11:18:15 AM By: Thayer Dallas Entered By: Thayer Dallas on 06/16/2023 11:52:26 -------------------------------------------------------------------------------- Wound Assessment Details Patient Name: Date of Service: HEINRICH, GERDES ESTER 06/16/2023 11:00 A M Medical Record Number: [id number] Patient Account Number: [id number] Date of Birth/Sex: Treating RN: 09-12-1951 (72 y.o. M) Primary Care Aamira Bischoff: [name] Other Clinician: Referring Rida Loudin: Treating Ryver Poblete/Extender: Ebony Cargo in Treatment: 6 Wound Status Wound Number: 2 Primary Etiology: Diabetic Wound/Ulcer of the Lower Extremity Wound Location: Right, Lateral Lower Leg Wound Status: Open Wounding Event: Gradually Appeared Comorbid Cataracts, Hypertension, Type II Diabetes, Seizure History: Disorder Date Acquired: 06/10/2023 Weeks Of Treatment: 0 Clustered Wound: No Photos Ivancic, Myer ([id number]) [id number] Page 10 of 11 Wound Measurements Length: (cm) 4.3 Width: (cm) 3 Depth: (cm) 0.1 Area: (cm) 10.132 Volume: (cm) 1.013 % Reduction in Area: 9.7% % Reduction in Volume: 9.7% Epithelialization:  None Tunneling: No Undermining: No Wound Description Classification: Grade 1 Wound Margin: Distinct, outline attached Exudate Amount: Medium Exudate Type: Serosanguineous Exudate Color: red, brown Foul Odor After Cleansing: No Slough/Fibrino Yes Wound Bed Granulation Amount: Large (67-100%) Exposed Structure Granulation Quality: Red Fascia Exposed: No Necrotic Amount: None Present (0%) Fat Layer (Subcutaneous Tissue) Exposed: Yes Tendon Exposed: No Muscle Exposed: No Joint Exposed: No Bone Exposed: No Periwound Skin Texture Texture Color No Abnormalities Noted: No No Abnormalities Noted: No Callus: No Atrophie Blanche: No Crepitus: No Cyanosis: No Excoriation: No Ecchymosis: No Induration: No Erythema: No Rash: No Hemosiderin Staining: No Scarring: No Mottled: No Pallor: No Moisture Rubor: No No Abnormalities Noted: No Dry / Scaly: No Temperature / Pain Maceration: No Temperature: No Abnormality Treatment Notes Wound #2 (Lower Leg) Wound Laterality: Right, Lateral Cleanser Soap and Water Discharge Instruction: May shower and wash wound with dial antibacterial soap and water prior to dressing change. Peri-Wound Care Sween Lotion (Moisturizing lotion) Discharge Instruction: Apply moisturizing lotion as directed Topical Gentamicin Discharge Instruction: applied in clinic. Mupirocin Ointment Discharge Instruction: applied in clinic. Primary Dressing Xeroform Occlusive Gauze Dressing, 4x4 in Discharge Instruction: Apply to wound bed as instructed Secondary Dressing [place], Daiquan ([id number]) [id number] Page 11 of 11 ABD Pad, 8x10 Discharge Instruction: Apply over primary dressing as directed. Secured With Compression Wrap Kerlix Roll 4.5x3.1 (in/yd) Discharge Instruction: Apply Kerlix and Coban compression as directed. Coban Self-Adherent Wrap 4x5 (in/yd) Discharge Instruction: Apply over Kerlix as directed. Compression  Stockings Add-Ons Electronic Signature(s) Signed: 06/19/2023 11:18:15 AM By: Thayer Dallas Entered By: Thayer Dallas on 06/16/2023 11:52:36 -------------------------------------------------------------------------------- Vitals Details Patient Name: Date of Service: CAELUM, NYGARD ESTER 06/16/2023 11:00 A M Medical Record Number: [id number] Patient Account Number: [id number] Date of Birth/Sex: Treating RN: 1950/12/30 (72 y.o. M) Primary Care Chandon Lazcano: [name] Other Clinician: Referring Jhonnie Aliano: Treating Ediberto Sens/Extender: Ebony Cargo in Treatment: 6 Vital Signs Time Taken: 11:31 Temperature (F): 98.9 Height (in): 67 Pulse (bpm): 52 Weight (lbs): 193 Respiratory Rate (breaths/min): 18 Body Mass Index (BMI): 30.2 Blood Pressure (mmHg): 131/66 Reference Range: 80 - 120 mg / dl Electronic Signature(s) Signed: 06/19/2023 11:18:15 AM By: Thayer Dallas Entered By: Thayer Dallas on 06/16/2023 11:32:01

## 2023-06-22 ENCOUNTER — Encounter (HOSPITAL_BASED_OUTPATIENT_CLINIC_OR_DEPARTMENT_OTHER): Payer: Medicare HMO | Attending: Internal Medicine | Admitting: Internal Medicine

## 2023-06-22 DIAGNOSIS — S81802A Unspecified open wound, left lower leg, initial encounter: Secondary | ICD-10-CM | POA: Insufficient documentation

## 2023-06-22 DIAGNOSIS — I87313 Chronic venous hypertension (idiopathic) with ulcer of bilateral lower extremity: Secondary | ICD-10-CM | POA: Insufficient documentation

## 2023-06-22 DIAGNOSIS — E11622 Type 2 diabetes mellitus with other skin ulcer: Secondary | ICD-10-CM | POA: Insufficient documentation

## 2023-06-22 DIAGNOSIS — S81801A Unspecified open wound, right lower leg, initial encounter: Secondary | ICD-10-CM | POA: Diagnosis not present

## 2023-06-22 DIAGNOSIS — I890 Lymphedema, not elsewhere classified: Secondary | ICD-10-CM | POA: Diagnosis not present

## 2023-06-22 DIAGNOSIS — X58XXXA Exposure to other specified factors, initial encounter: Secondary | ICD-10-CM | POA: Diagnosis not present

## 2023-06-22 DIAGNOSIS — I5042 Chronic combined systolic (congestive) and diastolic (congestive) heart failure: Secondary | ICD-10-CM | POA: Insufficient documentation

## 2023-06-22 DIAGNOSIS — I110 Hypertensive heart disease with heart failure: Secondary | ICD-10-CM | POA: Insufficient documentation

## 2023-06-22 NOTE — Progress Notes (Addendum)
Thibeaux, Hayward ([id number]) [id number] Page 1 of 9 Visit Report for 06/22/2023 Chief Complaint Document Details Patient Name: Date of Service: [name], [name] 06/22/2023 3:30 PM Medical Record Number: [id number] Patient Account Number: [id number] Date of Birth/Sex: Treating RN: Feb 06, 1951 (72 y.o. M) Primary Care Provider: [name] Other Clinician: Referring Provider: Treating Provider/Extender: Ebony Cargo in Treatment: 6 Information Obtained from: Patient Chief Complaint 05/05/2023; left lower extremity wound Electronic Signature(s) Signed: 06/22/2023 4:20:44 PM By: Geralyn Corwin DO Entered By: Geralyn Corwin on 06/22/2023 16:13:47 -------------------------------------------------------------------------------- HPI Details Patient Name: Date of Service: [name], [name] 06/22/2023 3:30 PM Medical Record Number: [id number] Patient Account Number: [id number] Date of Birth/Sex: Treating RN: 04-Jan-1951 (72 y.o. M) Primary Care Provider: [name] Other Clinician: Referring Provider: Treating Provider/Extender: Ebony Cargo in Treatment: 6 History of Present Illness HPI Description: CONSULT ONLY 04/28/2022 This is a 72 year old man with a past medical history notable for type 2 diabetes mellitus, stroke, and lower extremity edema. Apparently back in January, he did have a wound on his right lower extremity and was seen in the emergency department for this. I am not entirely sure how he ultimately was referred to the wound care center, but today he has no open wounds. He does have significant bilateral lower extremity edema, 2+ up to the knees. He does not wear compression stockings. He says that he applies Vaseline to his legs daily, but the skin is quite dry and scaly. ABIs in clinic today were 0.79 and 0.84. 05/05/2023 [name] is a 72 year old male with a past medical history of cryptogenic  right MCA stroke in 2021, chronic combined systolic and diastolic congestive heart failure, controlled type 2 diabetes on oral agents and lymphedema/chronic venous insufficiency that presents the clinic for a 1 week history of wound to his left lower extremity. He states it started as a blister and has ruptured creating the wound. He has been keeping the area covered. He does not wear compression stockings. He reports taking his Lasix as prescribed but has recently run out over the past day or 2. They are waiting on a refill at the pharmacy. Currently denies signs of infection. Denies shortness of breath or orthopnea. 5/21; patient presents for follow-up. We have been using Xeroform with antibiotic ointment under Kerlix/Coban to the left lower extremity. He has tolerated this well. He has no issues or complaints today. 5/30; patient presents for follow up. We have been using Xeroform and anitbiotic ointment under Kerlix/lower extremity. He took the wrap off due to increased itching to the periwound. Wounds are much smaller. No signs of infection. 6/7; we have been using Xeroform topical antibiotic under kerlix Coban. Unfortunately the patient simply will not leave the wrap in place. He came in with this rolled around his ankle. He says it is "itchy" his swelling is not well-controlled. 6/20; patient presents for follow-up. The dressing was changed at last clinic visit to Tubigrip and Hydrofera Blue to the left leg. This is much larger today. Unfortunately has developed a skin tear vs blister to the right lateral leg. 6/25; patient presents for follow-up. We have been using antibiotic ointment with Xeroform under Kerlix/Coban to the lower extremities bilaterally. Wounds are smaller. Unfortunately he keeps trying to take the wraps off. We are trying to get him home health but have not heard back yet. 7/1; patient presents for follow-up. We have been using antibiotic ointment with Xeroform under  Kerlix/Coban to the lower extremities bilaterally. Wounds are  smaller. [name], [name] ([id number]) [id number] Page 2 of 9 Electronic Signature(s) Signed: 06/22/2023 4:20:44 PM By: Geralyn Corwin DO Entered By: Geralyn Corwin on 06/22/2023 16:14:11 -------------------------------------------------------------------------------- Physical Exam Details Patient Name: Date of Service: HUY, [name] 06/22/2023 3:30 PM Medical Record Number: [id number] Patient Account Number: [id number] Date of Birth/Sex: Treating RN: [birth year] (72 y.o. M) Primary Care Provider: [name] Other Clinician: Referring Provider: Treating Provider/Extender: Ebony Cargo in Treatment: 6 Constitutional respirations regular, non-labored and within target range for patient.. Cardiovascular 2+ dorsalis pedis/posterior tibialis pulses. Psychiatric pleasant and cooperative. Notes T the left lower extremity there is an open wound to the anterior aspect with granulation tissue throughout. T the right lower extremity there is an open wound o o to the lateral aspect with granulation tissue present. No signs of surrounding infection. 2+ pitting edema to the knees bilaterally. Electronic Signature(s) Signed: 06/22/2023 4:20:44 PM By: Geralyn Corwin DO Entered By: Geralyn Corwin on 06/22/2023 16:14:33 -------------------------------------------------------------------------------- Physician Orders Details Patient Name: Date of Service: CORIN, CUZZORT [name] 06/22/2023 3:30 PM Medical Record Number: [id number] Patient Account Number: [id number] Date of Birth/Sex: Treating RN: [birth year] (72 y.o. Yates Decamp Primary Care Provider: [name] Other Clinician: Referring Provider: Treating Provider/Extender: Ebony Cargo in Treatment: 6 Verbal / Phone Orders: No Diagnosis Coding Follow-up Appointments ppointment in 1 week. - Dr.  Mikey Bussing Return A Other: - Will try to get home health started twice a week. Bathing/ Shower/ Hygiene May shower with protection but do not get wound dressing(s) wet. Protect dressing(s) with water repellant cover (for example, large plastic bag) or a cast cover and may then take shower. Edema Control - Lymphedema / SCD / Other Bilateral Lower Extremities Elevate legs to the level of the heart or above for 30 minutes daily and/or when sitting for 3-4 times a day throughout the day. A void standing for long periods of time. If compression wraps slide down please call wound center and speak with a nurse. Home Health Admit to Home Health for skilled nursing wound care. May utilize formulary equivalent dressing for wound treatment orders unless Gabbard, Jesper ([id number]) [id number] Page 3 of 9 otherwise specified. No change in wound care orders this week; continue Home Health for wound care. May utilize formulary equivalent dressing for wound treatment orders unless otherwise specified. - As of 06/16/23-Wednesday and Friday dressing changes. xeroform kerlix and Coban to bilateral lower legs. New wound care orders this week; continue Home Health for wound care. May utilize formulary equivalent dressing for wound treatment orders unless otherwise specified. - Wednesday and Friday dressing changes. xeroform kerlix and coban to bilateral lower legs. Wound Treatment Wound #1 - Lower Leg Wound Laterality: Left, Anterior Cleanser: Soap and Water 3 x Per Week/30 Days Discharge Instructions: May shower and wash wound with dial antibacterial soap and water prior to dressing change. Peri-Wound Care: Sween Lotion (Moisturizing lotion) 3 x Per Week/30 Days Discharge Instructions: Apply moisturizing lotion as directed Topical: Gentamicin 3 x Per Week/30 Days Discharge Instructions: applied in clinic. Topical: Mupirocin Ointment 3 x Per Week/30 Days Discharge Instructions:  applied in clinic. Prim Dressing: Xeroform Occlusive Gauze Dressing, 4x4 in 3 x Per Week/30 Days ary Discharge Instructions: Apply to wound bed as instructed Secondary Dressing: ABD Pad, 8x10 3 x Per Week/30 Days Discharge Instructions: Apply over primary dressing as directed. Compression Wrap: Kerlix Roll 4.5x3.1 (in/yd) 3 x Per Week/30 Days Discharge Instructions: Apply Kerlix and Coban compression as directed. Compression Wrap: Coban Self-Adherent Wrap 4x5 (in/yd)  3 x Per Week/30 Days Discharge Instructions: Apply over Kerlix as directed. Wound #2 - Lower Leg Wound Laterality: Right, Lateral Cleanser: Soap and Water 3 x Per Week/30 Days Discharge Instructions: May shower and wash wound with dial antibacterial soap and water prior to dressing change. Peri-Wound Care: Sween Lotion (Moisturizing lotion) 3 x Per Week/30 Days Discharge Instructions: Apply moisturizing lotion as directed Topical: Gentamicin 3 x Per Week/30 Days Discharge Instructions: applied in clinic. Topical: Mupirocin Ointment 3 x Per Week/30 Days Discharge Instructions: applied in clinic. Prim Dressing: Xeroform Occlusive Gauze Dressing, 4x4 in 3 x Per Week/30 Days ary Discharge Instructions: Apply to wound bed as instructed Secondary Dressing: ABD Pad, 8x10 3 x Per Week/30 Days Discharge Instructions: Apply over primary dressing as directed. Compression Wrap: Kerlix Roll 4.5x3.1 (in/yd) 3 x Per Week/30 Days Discharge Instructions: Apply Kerlix and Coban compression as directed. Compression Wrap: Coban Self-Adherent Wrap 4x5 (in/yd) 3 x Per Week/30 Days Discharge Instructions: Apply over Kerlix as directed. Electronic Signature(s) Signed: 06/24/2023 10:00:40 AM By: Geralyn Corwin DO Signed: 07/08/2023 7:59:22 AM By: Brenton Grills Previous Signature: 06/22/2023 4:20:44 PM Version By: Geralyn Corwin DO Entered By: Brenton Grills on 06/22/2023  16:40:58 -------------------------------------------------------------------------------- Problem List Details Patient Name: Date of Service: [name], [name] 06/22/2023 3:30 PM Medical Record Number: [id number] Patient Account Number: [id number] Neville, Maleko ([id number]) [id number] Page 4 of 9 Date of Birth/Sex: Treating RN: 1951-09-17 (72 y.o. M) Primary Care Provider: [name] Other Clinician: Referring Provider: Treating Provider/Extender: Ebony Cargo in Treatment: 6 Active Problems ICD-10 Encounter Code Description Active Date MDM Diagnosis S81.802A Unspecified open wound, left lower leg, initial encounter 05/05/2023 No Yes S81.801A Unspecified open wound, right lower leg, initial encounter 06/11/2023 No Yes I87.313 Chronic venous hypertension (idiopathic) with ulcer of bilateral lower extremity 06/11/2023 No Yes I89.0 Lymphedema, not elsewhere classified 05/05/2023 No Yes E11.622 Type 2 diabetes mellitus with other skin ulcer 05/05/2023 No Yes I50.42 Chronic combined systolic (congestive) and diastolic (congestive) heart failure 05/05/2023 No Yes Inactive Problems Resolved Problems Electronic Signature(s) Signed: 06/22/2023 4:20:44 PM By: Geralyn Corwin DO Entered By: Geralyn Corwin on 06/22/2023 16:13:34 -------------------------------------------------------------------------------- Progress Note Details Patient Name: Date of Service: [name], [name] 06/22/2023 3:30 PM Medical Record Number: [id number] Patient Account Number: [id number] Date of Birth/Sex: Treating RN: [birth year] (72 y.o. M) Primary Care Provider: [name] Other Clinician: Referring Provider: Treating Provider/Extender: Ebony Cargo in Treatment: 6 Subjective Chief Complaint Information obtained from Patient 05/05/2023; left lower extremity wound History of Present Illness (HPI) CONSULT ONLY 04/28/2022 This is a  72 year old man with a past medical history notable for type 2 diabetes mellitus, stroke, and lower extremity edema. Apparently back in January, he did have a wound on his right lower extremity and was seen in the emergency department for this. I am not entirely sure how he ultimately was referred to the wound care center, but today he has no open wounds. He does have significant bilateral lower extremity edema, 2+ up to the knees. He does not wear compression stockings. He says that he applies Vaseline to his legs daily, but the skin is quite dry and scaly. ABIs in clinic today were 0.79 and 0.84. 05/05/2023 Partridge, Jacqlyn Krauss ([id number]) [id number].pdf Page 5 of 9 Mr. Mayfield Demichael is a 72 year old male with a past medical history of cryptogenic right MCA stroke in 2021, chronic combined systolic and diastolic congestive heart failure, controlled type 2 diabetes on oral agents and lymphedema/chronic venous insufficiency that presents the clinic for  a 1 week history of wound to his left lower extremity. He states it started as a blister and has ruptured creating the wound. He has been keeping the area covered. He does not wear compression stockings. He reports taking his Lasix as prescribed but has recently run out over the past day or 2. They are waiting on a refill at the pharmacy. Currently denies signs of infection. Denies shortness of breath or orthopnea. 5/21; patient presents for follow-up. We have been using Xeroform with antibiotic ointment under Kerlix/Coban to the left lower extremity. He has tolerated this well. He has no issues or complaints today. 5/30; patient presents for follow up. We have been using Xeroform and anitbiotic ointment under Kerlix/lower extremity. He took the wrap off due to increased itching to the periwound. Wounds are much smaller. No signs of infection. 6/7; we have been using Xeroform topical antibiotic under kerlix Coban. Unfortunately  the patient simply will not leave the wrap in place. He came in with this rolled around his ankle. He says it is "itchy" his swelling is not well-controlled. 6/20; patient presents for follow-up. The dressing was changed at last clinic visit to Tubigrip and Hydrofera Blue to the left leg. This is much larger today. Unfortunately has developed a skin tear vs blister to the right lateral leg. 6/25; patient presents for follow-up. We have been using antibiotic ointment with Xeroform under Kerlix/Coban to the lower extremities bilaterally. Wounds are smaller. Unfortunately he keeps trying to take the wraps off. We are trying to get him home health but have not heard back yet. 7/1; patient presents for follow-up. We have been using antibiotic ointment with Xeroform under Kerlix/Coban to the lower extremities bilaterally. Wounds are smaller. Patient History Information obtained from Patient, Chart. Family History Unknown History, Cancer - Father, Diabetes - Mother, Lung Disease - Mother. Social History Never smoker, Marital Status - Single, Alcohol Use - Never, Drug Use - No History, Caffeine Use - Daily - coffee. Medical History Eyes Patient has history of Cataracts Denies history of Glaucoma, Optic Neuritis Cardiovascular Patient has history of Hypertension Endocrine Patient has history of Type II Diabetes Integumentary (Skin) Denies history of History of Burn Neurologic Patient has history of Seizure Disorder - CVA Denies history of Dementia, Neuropathy, Quadriplegia, Paraplegia Hospitalization/Surgery History - Loop Recorder insertion 01/23/2020. Medical A Surgical History Notes nd Eyes Diabetic Retinopathy Hypertensive Retinopathy Left Homonymous Hemianopsia Cardiovascular Acute Cerebrovascular Accident Neurologic Memory Change Objective Constitutional respirations regular, non-labored and within target range for patient.. Vitals Time Taken: 3:39 PM, Height: 67 in, Weight: 193  lbs, BMI: 30.2, Temperature: 98.6 F, Pulse: 55 bpm, Respiratory Rate: 18 breaths/min, Blood Pressure: 120/66 mmHg. Cardiovascular 2+ dorsalis pedis/posterior tibialis pulses. Psychiatric pleasant and cooperative. General Notes: T the left lower extremity there is an open wound to the anterior aspect with granulation tissue throughout. T the right lower extremity there is o o an open wound to the lateral aspect with granulation tissue present. No signs of surrounding infection. 2+ pitting edema to the knees bilaterally. Integumentary (Hair, Skin) Wound #1 status is Open. Original cause of wound was Gradually Appeared. The date acquired was: 04/28/2023. The wound has been in treatment 6 weeks. The wound is located on the Left,Anterior Lower Leg. The wound measures 2cm length x 1cm width x 0.1cm depth; 1.571cm^2 area and 0.157cm^3 volume. There is Fat Layer (Subcutaneous Tissue) exposed. There is no tunneling or undermining noted. There is a medium amount of serosanguineous drainage noted. The wound margin is  distinct with the outline attached to the wound base. There is large (67-100%) pink, pale granulation within the wound bed. There is no necrotic Ferrufino, Leven ([id number]) [phone].pdf Page 6 of 9 tissue within the wound bed. The periwound skin appearance exhibited: Callus, Scarring. The periwound skin appearance did not exhibit: Crepitus, Excoriation, Induration, Rash, Dry/Scaly, Maceration, Atrophie Blanche, Cyanosis, Ecchymosis, Hemosiderin Staining, Mottled, Pallor, Rubor, Erythema. Periwound temperature was noted as No Abnormality. The periwound has tenderness on palpation. Wound #2 status is Open. Original cause of wound was Gradually Appeared. The date acquired was: 06/10/2023. The wound has been in treatment 1 weeks. The wound is located on the Right,Lateral Lower Leg. The wound measures 3.5cm length x 2.5cm width x 0.1cm depth; 6.872cm^2 area and 0.687cm^3  volume. There is Fat Layer (Subcutaneous Tissue) exposed. There is no tunneling or undermining noted. There is a medium amount of serosanguineous drainage noted. The wound margin is distinct with the outline attached to the wound base. There is large (67-100%) red granulation within the wound bed. There is no necrotic tissue within the wound bed. The periwound skin appearance did not exhibit: Callus, Crepitus, Excoriation, Induration, Rash, Scarring, Dry/Scaly, Maceration, Atrophie Blanche, Cyanosis, Ecchymosis, Hemosiderin Staining, Mottled, Pallor, Rubor, Erythema. Periwound temperature was noted as No Abnormality. Assessment Active Problems ICD-10 Unspecified open wound, left lower leg, initial encounter Unspecified open wound, right lower leg, initial encounter Chronic venous hypertension (idiopathic) with ulcer of bilateral lower extremity Lymphedema, not elsewhere classified Type 2 diabetes mellitus with other skin ulcer Chronic combined systolic (congestive) and diastolic (congestive) heart failure Patient's wounds appear well-healing. I recommended continue the course with Xeroform and antibiotic ointment under Kerlix/Coban to the lower extremities bilaterally. Follow-up in 1 week. Procedures Wound #1 Pre-procedure diagnosis of Wound #1 is a Diabetic Wound/Ulcer of the Lower Extremity located on the Left,Anterior Lower Leg . There was a Three Layer Compression Therapy Procedure by Brenton Grills, RN. Post procedure Diagnosis Wound #1: Same as Pre-Procedure Notes: Scribed for Dr Mikey Bussing by Brenton Grills RN.Marland Kitchen Wound #2 Pre-procedure diagnosis of Wound #2 is a Diabetic Wound/Ulcer of the Lower Extremity located on the Right,Lateral Lower Leg . There was a Three Layer Compression Therapy Procedure by Brenton Grills, RN. Post procedure Diagnosis Wound #2: Same as Pre-Procedure Notes: Scribed for Dr Mikey Bussing by Brenton Grills RN.Marland Kitchen Plan Follow-up Appointments: Return Appointment in 1 week.  - Dr. Mikey Bussing Other: - Will try to get home health started twice a week. Bathing/ Shower/ Hygiene: May shower with protection but do not get wound dressing(s) wet. Protect dressing(s) with water repellant cover (for example, large plastic bag) or a cast cover and may then take shower. Edema Control - Lymphedema / SCD / Other: Elevate legs to the level of the heart or above for 30 minutes daily and/or when sitting for 3-4 times a day throughout the day. Avoid standing for long periods of time. If compression wraps slide down please call wound center and speak with a nurse. Home Health: Admit to Home Health for skilled nursing wound care. May utilize formulary equivalent dressing for wound treatment orders unless otherwise specified. No change in wound care orders this week; continue Home Health for wound care. May utilize formulary equivalent dressing for wound treatment orders unless otherwise specified. - As of 06/16/23-Wednesday and Friday dressing changes. xeroform kerlix and Coban to bilateral lower legs. New wound care orders this week; continue Home Health for wound care. May utilize formulary equivalent dressing for wound treatment orders unless otherwise specified. - Wednesday  and Friday dressing changes. xeroform kerlix and coban to bilateral lower legs. WOUND #1: - Lower Leg Wound Laterality: Left, Anterior Cleanser: Soap and Water 3 x Per Week/30 Days Discharge Instructions: May shower and wash wound with dial antibacterial soap and water prior to dressing change. Peri-Wound Care: Sween Lotion (Moisturizing lotion) 3 x Per Week/30 Days Discharge Instructions: Apply moisturizing lotion as directed Topical: Gentamicin 3 x Per Week/30 Days Discharge Instructions: applied in clinic. Topical: Mupirocin Ointment 3 x Per Week/30 Days Discharge Instructions: applied in clinic. Prim Dressing: Xeroform Occlusive Gauze Dressing, 4x4 in 3 x Per Week/30 Days ary Discharge Instructions: Apply  to wound bed as instructed Secondary Dressing: ABD Pad, 8x10 3 x Per Week/30 Days Discharge Instructions: Apply over primary dressing as directed. Com pression Wrap: Kerlix Roll 4.5x3.1 (in/yd) 3 x Per Week/30 Days Discharge Instructions: Apply Kerlix and Coban compression as directed. Com pression Wrap: Coban Self-Adherent Wrap 4x5 (in/yd) 3 x Per Week/30 Days Discharge Instructions: Apply over Kerlix as directed. Hooper, Seymour ([id number]) [id number] Page 7 of 9 WOUND #2: - Lower Leg Wound Laterality: Right, Lateral Cleanser: Soap and Water 3 x Per Week/30 Days Discharge Instructions: May shower and wash wound with dial antibacterial soap and water prior to dressing change. Peri-Wound Care: Sween Lotion (Moisturizing lotion) 3 x Per Week/30 Days Discharge Instructions: Apply moisturizing lotion as directed Topical: Gentamicin 3 x Per Week/30 Days Discharge Instructions: applied in clinic. Topical: Mupirocin Ointment 3 x Per Week/30 Days Discharge Instructions: applied in clinic. Prim Dressing: Xeroform Occlusive Gauze Dressing, 4x4 in 3 x Per Week/30 Days ary Discharge Instructions: Apply to wound bed as instructed Secondary Dressing: ABD Pad, 8x10 3 x Per Week/30 Days Discharge Instructions: Apply over primary dressing as directed. Com pression Wrap: Kerlix Roll 4.5x3.1 (in/yd) 3 x Per Week/30 Days Discharge Instructions: Apply Kerlix and Coban compression as directed. Com pression Wrap: Coban Self-Adherent Wrap 4x5 (in/yd) 3 x Per Week/30 Days Discharge Instructions: Apply over Kerlix as directed. 1. Xeroform and antibiotic ointment under Kerlix/Coban to the lower extremities bilaterally 2. Follow-up in 1 week Electronic Signature(s) Signed: 06/23/2023 5:27:02 PM By: Shawn Stall RN, BSN Signed: 06/24/2023 10:00:40 AM By: Geralyn Corwin DO Previous Signature: 06/22/2023 4:20:44 PM Version By: Geralyn Corwin DO Entered By: Shawn Stall on 06/23/2023  17:22:15 -------------------------------------------------------------------------------- HxROS Details Patient Name: Date of Service: [name], [name] 06/22/2023 3:30 PM Medical Record Number: [id number] Patient Account Number: [id number] Date of Birth/Sex: Treating RN: [birth year] (72 y.o. M) Primary Care Provider: [name] Other Clinician: Referring Provider: Treating Provider/Extender: Ebony Cargo in Treatment: 6 Information Obtained From Patient Chart Eyes Medical History: Positive for: Cataracts Negative for: Glaucoma; Optic Neuritis Past Medical History Notes: Diabetic Retinopathy Hypertensive Retinopathy Left Homonymous Hemianopsia Cardiovascular Medical History: Positive for: Hypertension Past Medical History Notes: Acute Cerebrovascular Accident Endocrine Medical History: Positive for: Type II Diabetes Treated with: Oral agents Blood sugar tested every day: Yes Tested : every day Integumentary (Skin) Medical History: Negative for: History of Burn Neurologic Mclinden, [name] ([id number]) [id number] Page 8 of 9 Medical History: Positive for: Seizure Disorder - CVA Negative for: Dementia; Neuropathy; Quadriplegia; Paraplegia Past Medical History Notes: Memory Change HBO Extended History Items Eyes: Cataracts Immunizations Pneumococcal Vaccine: Received Pneumococcal Vaccination: No Implantable Devices None Hospitalization / Surgery History Type of Hospitalization/Surgery Loop Recorder insertion 01/23/2020 Family and Social History Unknown History: Yes; Cancer: Yes - Father; Diabetes: Yes - Mother; Lung Disease: Yes - Mother; Never smoker; Marital Status - Single; Alcohol Use: Never; Drug  Use: No History; Caffeine Use: Daily - coffee; Financial Concerns: No; Food, Clothing or Shelter Needs: No; Support System Lacking: No; Transportation Concerns: No Electronic Signature(s) Signed: 06/22/2023 4:20:44 PM  By: Geralyn Corwin DO Entered By: Geralyn Corwin on 06/22/2023 16:14:16 -------------------------------------------------------------------------------- SuperBill Details Patient Name: Date of Service: [name], [name] 06/22/2023 Medical Record Number: [id number] Patient Account Number: [id number] Date of Birth/Sex: Treating RN: Aug 30, 1951 (72 y.o. M) Primary Care Provider: [name] Other Clinician: Referring Provider: Treating Provider/Extender: Ebony Cargo in Treatment: 6 Diagnosis Coding ICD-10 Codes Code Description [phone] Unspecified open wound, left lower leg, initial encounter S81.801A Unspecified open wound, right lower leg, initial encounter I87.313 Chronic venous hypertension (idiopathic) with ulcer of bilateral lower extremity I89.0 Lymphedema, not elsewhere classified E11.622 Type 2 diabetes mellitus with other skin ulcer I50.42 Chronic combined systolic (congestive) and diastolic (congestive) heart failure Facility Procedures Physician Procedures : CPT4 Code Description Modifier [id number] - WC PHYS LEVEL 3 - EST PT ICD-10 Diagnosis Description S81.802A Unspecified open wound, left lower leg, initial encounter S81.801A Unspecified open wound, right lower leg, initial encounter I87.313 Chronic  venous hypertension (idiopathic) with ulcer of bilateral lower extremity E11.622 Type 2 diabetes mellitus with other skin ulcer Quantity: 1 Electronic Signature(s) Signed: 07/16/2023 8:24:27 AM By: Pearletha Alfred Signed: 07/16/2023 3:41:47 PM By: Geralyn Corwin DO Previous Signature: 06/22/2023 4:20:44 PM Version By: Geralyn Corwin DO Entered By: Pearletha Alfred on 07/16/2023 08:24:26

## 2023-07-01 NOTE — Progress Notes (Signed)
[facility] - Clinic Note  07/06/2023     CHIEF COMPLAINT Patient presents for Retina Follow Up   HISTORY OF PRESENT ILLNESS: [name] is a 72 y.o. male who presents to the clinic today for:   HPI     Retina Follow Up   Patient presents with  Diabetic Retinopathy.  In both eyes.  This started 6 weeks ago.  I, the attending physician,  performed the HPI with the patient and updated documentation appropriately.        Comments   Patient here for 6 weeks for retina follow up for NPDR OU. Patient states vision about the same. No eye trouble. No eye pain. Using drops.       Last edited by Rennis Chris, MD on 07/06/2023 11:44 AM.    Patients BP has been fluctuating recently, it dropped so low he had to go to the ED, his BP med has been cut in half and now it is as high as 150, pt had left eye cataract sx earlier this month, right eye is scheduled for August  Referring physician: Raymon Mutton., FNP [address] [place],  [place] [address]  HISTORICAL INFORMATION:   Selected notes from the MEDICAL RECORD NUMBER Referred by Dr. Fabian Sharp for concern of HTN Ret   CURRENT MEDICATIONS: Current Outpatient Medications (Ophthalmic Drugs)  Medication Sig   latanoprost (XALATAN) 0.005 % ophthalmic solution SMARTSIG:In Eye(s)   Polyvinyl Alcohol-Povidone (REFRESH OP) Place 1 drop into both eyes in the morning and at bedtime.   No current facility-administered medications for this visit. (Ophthalmic Drugs)   Current Outpatient Medications (Other)  Medication Sig   aspirin 325 MG EC tablet aspirin 325 mg tablet,delayed release   atorvastatin (LIPITOR) 40 MG tablet Take 1 tablet (40 mg total) by mouth daily at 6 PM.   bacitracin ointment Apply 1 Application topically 2 (two) times daily.   carvedilol (COREG) 6.25 MG tablet Take 6.25 mg by mouth 2 (two) times daily with a meal.   donepezil (ARICEPT) 10 MG tablet TAKE 1 TABLET AT BEDTIME    hydrochlorothiazide (HYDRODIURIL) 25 MG tablet Take 25 mg by mouth daily.   losartan (COZAAR) 100 MG tablet Take 100 mg by mouth daily.   memantine (NAMENDA) 10 MG tablet TAKE 1 TABLET TWICE DAILY   metFORMIN (GLUCOPHAGE) 500 MG tablet Take by mouth 2 (two) times daily with a meal.   Multiple Vitamins-Minerals (ONE-A-DAY MENS 50+ PO) Take 1 tablet by mouth daily.   mupirocin ointment (BACTROBAN) 2 % as needed.   PFIZER-BIONT COVID-19 VAC-TRIS SUSP injection    PFIZER-BIONTECH COVID-19 VACC 30 MCG/0.3ML injection    traZODone (DESYREL) 100 MG tablet Take 100 mg by mouth at bedtime.   furosemide (LASIX) 40 MG tablet Take 1 tablet (40 mg total) by mouth 2 (two) times daily for 4 days, THEN 1 tablet (40 mg total) daily.   Potassium Chloride ER 20 MEQ TBCR Take 1 tablet (20 mEq total) by mouth in the morning and at bedtime for 4 days, THEN 1 tablet (20 mEq total) daily.   Current Facility-Administered Medications (Other)  Medication Route   0.9 %  sodium chloride infusion Intravenous   0.9 %  sodium chloride infusion Intravenous   REVIEW OF SYSTEMS: ROS   Positive for: Endocrine, Eyes Negative for: Constitutional, Gastrointestinal, Neurological, Skin, Genitourinary, Musculoskeletal, HENT, Cardiovascular, Respiratory, Psychiatric, Allergic/Imm, Heme/Lymph Last edited by Laddie Aquas, COA on 07/06/2023  9:24 AM.  ALLERGIES Allergies  Allergen Reactions   Shellfish Allergy Anaphylaxis   PAST MEDICAL HISTORY Past Medical History:  Diagnosis Date   Acute cerebrovascular accident (CVA) (HCC) 01/20/2020   Benign essential HTN 01/20/2020   Cataract    Mixed form OU   Dementia (HCC)    Diabetic retinopathy (HCC)    NPDR OU   Hypertensive retinopathy    OU   Seizures (HCC)    per pt- "none since teenage years"   Past Surgical History:  Procedure Laterality Date   LOOP RECORDER INSERTION N/A 01/23/2020   Procedure: LOOP RECORDER INSERTION;  Surgeon: Duke Salvia, MD;   Location: [facility] INVASIVE CV LAB;  Service: Cardiovascular;  Laterality: N/A;   FAMILY HISTORY Family History  Problem Relation Age of Onset   COPD Mother    Diabetes Mother    Dementia Father    Diabetes Father    Cancer Father        prostate and lung   Colon cancer Neg Hx    Esophageal cancer Neg Hx    Stomach cancer Neg Hx    Rectal cancer Neg Hx    SOCIAL HISTORY Social History   Tobacco Use   Smoking status: Never   Smokeless tobacco: Never  Vaping Use   Vaping status: Never Used  Substance Use Topics   Alcohol use: Never   Drug use: Never       OPHTHALMIC EXAM: Base Eye Exam     Visual Acuity (Snellen - Linear)       Right Left   Dist [place] 20/40 20/40   Dist ph [place]  20/25 -1         Tonometry (Tonopen, 9:22 AM)       Right Left   Pressure 14 14         Pupils       Dark Light Shape React APD   Right 3 2 Round Brisk None   Left 3 2 Round Brisk None         Visual Fields (Counting fingers)       Left Right    Full Full         Extraocular Movement       Right Left    Full, Ortho Full, Ortho         Neuro/Psych     Oriented x3: Yes   Mood/Affect: Normal         Dilation     Both eyes: 1.0% Mydriacyl, 2.5% Phenylephrine @ 9:22 AM           Slit Lamp and Fundus Exam     Slit Lamp Exam       Right Left   Lids/Lashes Dermatochalasis - upper lid, mild MGD Dermatochalasis - upper lid   Conjunctiva/Sclera Nasal and temporal Pinguecula, , Melanosis Nasal and temporal Pinguecula, mild Melanosis   Cornea Arcus, 2+fine Punctate epithelial erosions inferiorly, well healed cataract wound Arcus, 2+ Punctate epithelial erosions with irregular epi inferiorly, mild debris in tear film   Anterior Chamber Deep, 2+cell/pigment Deep and quiet   Iris Round and dilated, No NVI Round and dilated, No NVI   Lens PC IOL in good position 3+ Nuclear sclerosis, 3+ Cortical cataract   Anterior Vitreous Vitreous syneresis Vitreous syneresis,  Posterior vitreous detachment         Fundus Exam       Right Left   Disc sharp rim, 2-3+pallor, +cupping Sharp, mild Pallor   C/D Ratio 0.6 0.5  Macula Flat, good foveal reflex, scattered MA / DBH greatest superior mac, No frank edema Flat, good foveal reflex, scattered Microaneurysms/DBH, trace cystic changes IT macula -- slightly increased, focal blot heme IN fovea   Vessels attenuated, mild tortuosity attenuated, Tortuous   Periphery Attached, operculated hole at 1000 with partial pigment and +cuff of SRF -- good laser changes surrounding, scattered MA/DBH greatest posteriorly, White without pressure temporal periphery, good peripheral PRP changes 360, scattered DBH greatest superiorly Attached, scattered DBH, focal pigmented CR scar at 0130 equator, good peripheral 360 PRP           IMAGING AND PROCEDURES  Imaging and Procedures for @TODAY @  OCT, Retina - OU - Both Eyes       Right Eye Quality was good. Central Foveal Thickness: 239. Progression has been stable. Findings include normal foveal contour, no IRF, no SRF (mild diffuse retinal thinning -- stable).   Left Eye Quality was borderline. Central Foveal Thickness: 361. Progression has worsened. Findings include normal foveal contour, no SRF, intraretinal hyper-reflective material, intraretinal fluid (Mild interval increase in IRF / edema IT fovea and mac ).   Notes *Images captured and stored on drive  Diagnosis / Impression:  OD: NFP, no IRF / SRF -- mild diffuse retinal thinning -- stable OS: NFP, no SRF, Mild interval increase in IRF / edema IT fovea and mac    Clinical management:  See below  Abbreviations: NFP - Normal foveal profile. CME - cystoid macular edema. PED - pigment epithelial detachment. IRF - intraretinal fluid. SRF - subretinal fluid. EZ - ellipsoid zone. ERM - epiretinal membrane. ORA - outer retinal atrophy. ORT - outer retinal tubulation. SRHM - subretinal hyper-reflective material       Intravitreal Injection, Pharmacologic Agent - OS - Left Eye       Time Out 07/06/2023. 10:18 AM. Confirmed correct patient, procedure, site, and patient consented.   Anesthesia Topical anesthesia was used. Anesthetic medications included Lidocaine 2%, Proparacaine 0.5%.   Procedure Preparation included 5% betadine to ocular surface, eyelid speculum. A (32g) needle was used.   Injection: 1.25 mg Bevacizumab 1.25mg /0.42ml   Route: Intravitreal, Site: Left Eye   NDC: [id number], Lot: [id number], Expiration date: 09/28/2023   Post-op Post injection exam found visual acuity of at least counting fingers. The patient tolerated the procedure well. There were no complications. The patient received written and verbal post procedure care education.            ASSESSMENT/PLAN:    ICD-10-CM   1. Moderate nonproliferative diabetic retinopathy of both eyes with macular edema associated with type 2 diabetes mellitus (HCC)  E11.3313 OCT, Retina - OU - Both Eyes    Intravitreal Injection, Pharmacologic Agent - OS - Left Eye    Bevacizumab (AVASTIN) SOLN 1.25 mg    2. Retinal hole of right eye  H33.321     3. Essential hypertension  I10     4. Hypertensive retinopathy of both eyes  H35.033     5. Combined forms of age-related cataract of left eye  H25.812     6. Pseudophakia  Z96.1     7. Dry eyes  H04.123      1. Moderate non-proliferative diabetic retinopathy, OU - s/p IVA OS #1 (05.07.21), #2 (06.04.21), #3 (07.06.21), #4 (08.03.21), #5 (08.31.21), #6 (9.28.21), #7 (11.2.21), #8 (12.07.21), #9 (02.02.22), #10 (03.30.22), #11 (05.25.22), #12 (08.03.22), #13 (10.20.22), #14 (01.12.23), #15 (01.22.24), #16 (03.11.24), #17 (03.11.24), #18 (04.22.24), #19 (06.03.24)  - s/p PRP OS (  04.09.21) -- good laser changes - s/p PRP OD (02.05.24) for peripheral vascular nonperfusion -- good early laser changes  - repeat FA (09.28.21) shows late leaking MA OU, significant capillary drop-out  -  repeat FA (01.23.24) shows OD: Scattered patches of vascular non-perfusion; large area of vascular non perfusion temporally, no NV; OS: delayed filling; scattered patches of vascular non perfusion peripherally, staining of 360 laser, scattered leaking MA -- most prominent IT to fovea, no NV  - exam shows scattered MA/IRH/CWS OU -- improving   - BCVA OD 20/40, OS 20/25 -- both stable - OCT shows OD: mild diffuse retinal thinning -- stable; OS: Persistent cystic changes IT fovea and mac -- increased at 6 weeks - recommend IVA OS #20 today, 07.15.24 w/ f/u back to 5 wks  - pt in agreement  - RBA of procedure discussed, questions answered - IVA informed consent obtained and re-signed OS (01.22.24) - see procedure note  - f/u 5 wks -- DFE/OCT, possible injction  2. Operculated retinal hole w/ cuff of SRF / focal RD, right eye - operculated hole located at 1000 with partial pigment and +cuff of SRF / focal RD  - s/p retinopexy OD (03.17.21) -- good laser changes surrounding  - stable, monitor  3,4. Hypertensive retinopathy OU  - discussed importance of tight BP control  - monitor  5. Mixed form age related cataract OS - The symptoms of cataract, surgical options, and treatments and risks were discussed with patient.  - discussed diagnosis and progression  - under the expert management of Dr. Laruth Bouchard - surgery scheduled for July 24, 2023  6. Pseudophakia OD  - s/p CE/IOL (Dr. Laruth Bouchard, 07.02.24)  - IOL in good position, doing well  - monitor  7. Dry eyes OU  - recommend artificial tears and lubricating ointment as needed  Ophthalmic Meds Ordered this visit:  Meds ordered this encounter  Medications   Bevacizumab (AVASTIN) SOLN 1.25 mg     Return in about 5 weeks (around 08/10/2023) for f/u NPDR OU, DFE, OCT.  There are no Patient Instructions on file for this visit.  Explained the diagnoses, plan, and follow up with the patient and they expressed understanding.  Patient  expressed understanding of the importance of proper follow up care.   This document serves as a record of services personally performed by Karie Chimera, MD, PhD. It was created on their behalf by Annalee Genta, COMT. The creation of this record is the provider's dictation and/or activities during the visit.  Electronically signed by: Annalee Genta, COMT 07/06/23 11:48 AM  This document serves as a record of services personally performed by Karie Chimera, MD, PhD. It was created on their behalf by Glee Arvin. Manson Passey, OA an ophthalmic technician. The creation of this record is the provider's dictation and/or activities during the visit.    Electronically signed by: Glee Arvin. Manson Passey, OA 07/06/23 11:48 AM  Karie Chimera, M.D., Ph.D. Diseases & Surgery of the Retina and Vitreous [facility]  I have reviewed the above documentation for accuracy and completeness, and I agree with the above. Karie Chimera, M.D., Ph.D. 07/06/23 11:49 AM  Abbreviations: M myopia (nearsighted); A astigmatism; H hyperopia (farsighted); P presbyopia; Mrx spectacle prescription;  CTL contact lenses; OD right eye; OS left eye; OU both eyes  XT exotropia; ET esotropia; PEK punctate epithelial keratitis; PEE punctate epithelial erosions; DES dry eye syndrome; MGD meibomian gland dysfunction; ATs artificial tears; PFAT's preservative  free artificial tears; NSC nuclear sclerotic cataract; PSC posterior subcapsular cataract; ERM epi-retinal membrane; PVD posterior vitreous detachment; RD retinal detachment; DM diabetes mellitus; DR diabetic retinopathy; NPDR non-proliferative diabetic retinopathy; PDR proliferative diabetic retinopathy; CSME clinically significant macular edema; DME diabetic macular edema; dbh dot blot hemorrhages; CWS cotton wool spot; POAG primary open angle glaucoma; C/D cup-to-disc ratio; HVF humphrey visual field; GVF goldmann visual field; OCT optical coherence tomography; IOP  intraocular pressure; BRVO Branch retinal vein occlusion; CRVO central retinal vein occlusion; CRAO central retinal artery occlusion; BRAO branch retinal artery occlusion; RT retinal tear; SB scleral buckle; PPV pars plana vitrectomy; VH Vitreous hemorrhage; PRP panretinal laser photocoagulation; IVK intravitreal kenalog; VMT vitreomacular traction; MH Macular hole;  NVD neovascularization of the disc; NVE neovascularization elsewhere; AREDS age related eye disease study; ARMD age related macular degeneration; POAG primary open angle glaucoma; EBMD epithelial/anterior basement membrane dystrophy; ACIOL anterior chamber intraocular lens; IOL intraocular lens; PCIOL posterior chamber intraocular lens; Phaco/IOL phacoemulsification with intraocular lens placement; PRK photorefractive keratectomy; LASIK laser assisted in situ keratomileusis; HTN hypertension; DM diabetes mellitus; COPD chronic obstructive pulmonary disease

## 2023-07-03 ENCOUNTER — Encounter (HOSPITAL_BASED_OUTPATIENT_CLINIC_OR_DEPARTMENT_OTHER): Payer: Medicare HMO | Admitting: Internal Medicine

## 2023-07-03 DIAGNOSIS — S81802A Unspecified open wound, left lower leg, initial encounter: Secondary | ICD-10-CM | POA: Diagnosis not present

## 2023-07-03 DIAGNOSIS — I87313 Chronic venous hypertension (idiopathic) with ulcer of bilateral lower extremity: Secondary | ICD-10-CM | POA: Diagnosis not present

## 2023-07-03 DIAGNOSIS — E11622 Type 2 diabetes mellitus with other skin ulcer: Secondary | ICD-10-CM | POA: Diagnosis not present

## 2023-07-03 DIAGNOSIS — S81801A Unspecified open wound, right lower leg, initial encounter: Secondary | ICD-10-CM | POA: Diagnosis not present

## 2023-07-03 NOTE — Progress Notes (Signed)
[name] ([id number]) [id number] Page 1 of 7 Visit Report for 07/03/2023 Chief Complaint Document Details Patient Name: Date of Service: [name], [name] 07/03/2023 8:00 A M Medical Record Number: [id number] Patient Account Number: [id number] Date of Birth/Sex: Treating RN: 1951-10-16 (72 y.o. M) Primary Care Provider: [name] Other Clinician: Referring Provider: Treating Provider/Extender: Ebony Cargo in Treatment: 8 Information Obtained from: Patient Chief Complaint 05/05/2023; left lower extremity wound Electronic Signature(s) Signed: 07/03/2023 9:53:20 AM By: Geralyn Corwin DO Entered By: Geralyn Corwin on 07/03/2023 08:36:56 -------------------------------------------------------------------------------- HPI Details Patient Name: Date of Service: [name], [name] 07/03/2023 8:00 A M Medical Record Number: [id number] Patient Account Number: [id number] Date of Birth/Sex: Treating RN: 1951/02/15 (72 y.o. M) Primary Care Provider: [name] Other Clinician: Referring Provider: Treating Provider/Extender: Ebony Cargo in Treatment: 8 History of Present Illness HPI Description: CONSULT ONLY 04/28/2022 This is a 71 year old man with a past medical history notable for type 2 diabetes mellitus, stroke, and lower extremity edema. Apparently back in January, he did have a wound on his right lower extremity and was seen in the emergency department for this. I am not entirely sure how he ultimately was referred to the wound care center, but today he has no open wounds. He does have significant bilateral lower extremity edema, 2+ up to the knees. He does not wear compression stockings. He says that he applies Vaseline to his legs daily, but the skin is quite dry and scaly. ABIs in clinic today were 0.79 and 0.84. 05/05/2023 [name] is a 72 year old male with a past medical history of  cryptogenic right MCA stroke in 2021, chronic combined systolic and diastolic congestive heart failure, controlled type 2 diabetes on oral agents and lymphedema/chronic venous insufficiency that presents the clinic for a 1 week history of wound to his left lower extremity. He states it started as a blister and has ruptured creating the wound. He has been keeping the area covered. He does not wear compression stockings. He reports taking his Lasix as prescribed but has recently run out over the past day or 2. They are waiting on a refill at the pharmacy. Currently denies signs of infection. Denies shortness of breath or orthopnea. 5/21; patient presents for follow-up. We have been using Xeroform with antibiotic ointment under Kerlix/Coban to the left lower extremity. He has tolerated this well. He has no issues or complaints today. 5/30; patient presents for follow up. We have been using Xeroform and anitbiotic ointment under Kerlix/lower extremity. He took the wrap off due to increased itching to the periwound. Wounds are much smaller. No signs of infection. 6/7; we have been using Xeroform topical antibiotic under kerlix Coban. Unfortunately the patient simply will not leave the wrap in place. He came in with this rolled around his ankle. He says it is "itchy" his swelling is not well-controlled. 6/20; patient presents for follow-up. The dressing was changed at last clinic visit to Tubigrip and Hydrofera Blue to the left leg. This is much larger today. Unfortunately has developed a skin tear vs blister to the right lateral leg. 6/25; patient presents for follow-up. We have been using antibiotic ointment with Xeroform under Kerlix/Coban to the lower extremities bilaterally. Wounds are smaller. Unfortunately he keeps trying to take the wraps off. We are trying to get him home health but have not heard back yet. 7/1; patient presents for follow-up. We have been using antibiotic ointment with Xeroform  under Kerlix/Coban to the lower extremities bilaterally.  Wounds are smaller. Rohm, [name] ([id number]) [id number] Page 2 of 7 7/12; patient presents for follow-up. We have been using antibiotic ointment with Xeroform under Kerlix/Coban to the lower extremities bilaterally. The wounds have healed. He has compression stockings at home. Electronic Signature(s) Signed: 07/03/2023 9:53:20 AM By: Geralyn Corwin DO Entered By: Geralyn Corwin on 07/03/2023 08:37:22 -------------------------------------------------------------------------------- Physical Exam Details Patient Name: Date of Service: [name], [name] 07/03/2023 8:00 A M Medical Record Number: [id number] Patient Account Number: [id number] Date of Birth/Sex: Treating RN: [birth year] (72 y.o. M) Primary Care Provider: [name] Other Clinician: Referring Provider: Treating Provider/Extender: Ebony Cargo in Treatment: 8 Constitutional respirations regular, non-labored and within target range for patient.. Cardiovascular 2+ dorsalis pedis/posterior tibialis pulses. Psychiatric pleasant and cooperative. Notes T lower extremities bilaterally there is epithelization to the previous wound sites. Decent edema control. No signs of surrounding infection. o Electronic Signature(s) Signed: 07/03/2023 9:53:20 AM By: Geralyn Corwin DO Entered By: Geralyn Corwin on 07/03/2023 08:38:04 -------------------------------------------------------------------------------- Physician Orders Details Patient Name: Date of Service: [name], [name] 07/03/2023 8:00 A M Medical Record Number: [id number] Patient Account Number: [id number] Date of Birth/Sex: Treating RN: [birth year] (72 y.o. [name], [name] Primary Care Provider: [name] Other Clinician: Referring Provider: Treating Provider/Extender: Ebony Cargo in Treatment: 8 Verbal / Phone Orders:  No Diagnosis Coding ICD-10 Coding Code Description [id number] Unspecified open wound, left lower leg, initial encounter S81.801A Unspecified open wound, right lower leg, initial encounter I87.313 Chronic venous hypertension (idiopathic) with ulcer of bilateral lower extremity I89.0 Lymphedema, not elsewhere classified E11.622 Type 2 diabetes mellitus with other skin ulcer I50.42 Chronic combined systolic (congestive) and diastolic (congestive) heart failure Discharge From [facility] Discharge from Wound Care Center - will apply tubrigrip size D to both legs to wear home. Once at home, remove tubigrip and apply compression Chapa, [name] ([id number]) [phone].pdf Page 3 of 7 stockings. Call wound center if you need aid in wound care in the future. Edema Control - Lymphedema / SCD / Other Elevate legs to the level of the heart or above for 30 minutes daily and/or when sitting for 3-4 times a day throughout the day. Avoid standing for long periods of time. Patient to wear own compression stockings every day. Exercise regularly Moisturize legs daily. Compression stocking or Garment 20-30 mm/Hg pressure to: Home Health Discontinue home health for wound care. - [facility]- wounds are healed. Electronic Signature(s) Signed: 07/03/2023 9:53:20 AM By: Geralyn Corwin DO Entered By: Geralyn Corwin on 07/03/2023 08:38:09 -------------------------------------------------------------------------------- Problem List Details Patient Name: Date of Service: [name], [name] 07/03/2023 8:00 A M Medical Record Number: [id number] Patient Account Number: [id number] Date of Birth/Sex: Treating RN: 22-Apr-1951 (72 y.o. [name] Primary Care Provider: [name] Other Clinician: Referring Provider: Treating Provider/Extender: Ebony Cargo in Treatment: 8 Active Problems ICD-10 Encounter Code Description Active Date  MDM Diagnosis S81.802A Unspecified open wound, left lower leg, initial encounter 05/05/2023 No Yes S81.801A Unspecified open wound, right lower leg, initial encounter 06/11/2023 No Yes I87.313 Chronic venous hypertension (idiopathic) with ulcer of bilateral lower extremity 06/11/2023 No Yes I89.0 Lymphedema, not elsewhere classified 05/05/2023 No Yes E11.622 Type 2 diabetes mellitus with other skin ulcer 05/05/2023 No Yes I50.42 Chronic combined systolic (congestive) and diastolic (congestive) heart failure 05/05/2023 No Yes Inactive Problems Resolved Problems Electronic Signature(s) Signed: 07/03/2023 9:53:20 AM By: Geralyn Corwin DO Entered By: Geralyn Corwin on 07/03/2023 08:36:43 Cantera, [name] ([id number]) [id number] Page 4 of 7 -------------------------------------------------------------------------------- Progress  Note Details Patient Name: Date of Service: [name], [name] 07/03/2023 8:00 A M Medical Record Number: [id number] Patient Account Number: [id number] Date of Birth/Sex: Treating RN: 06/21/1951 (72 y.o. M) Primary Care Provider: [name] Other Clinician: Referring Provider: Treating Provider/Extender: Ebony Cargo in Treatment: 8 Subjective Chief Complaint Information obtained from Patient 05/05/2023; left lower extremity wound History of Present Illness (HPI) CONSULT ONLY 04/28/2022 This is a 72 year old man with a past medical history notable for type 2 diabetes mellitus, stroke, and lower extremity edema. Apparently back in January, he did have a wound on his right lower extremity and was seen in the emergency department for this. I am not entirely sure how he ultimately was referred to the wound care center, but today he has no open wounds. He does have significant bilateral lower extremity edema, 2+ up to the knees. He does not wear compression stockings. He says that he applies Vaseline to his legs daily, but  the skin is quite dry and scaly. ABIs in clinic today were 0.79 and 0.84. 05/05/2023 Mr. Morio Dukart is a 72 year old male with a past medical history of cryptogenic right MCA stroke in 2021, chronic combined systolic and diastolic congestive heart failure, controlled type 2 diabetes on oral agents and lymphedema/chronic venous insufficiency that presents the clinic for a 1 week history of wound to his left lower extremity. He states it started as a blister and has ruptured creating the wound. He has been keeping the area covered. He does not wear compression stockings. He reports taking his Lasix as prescribed but has recently run out over the past day or 2. They are waiting on a refill at the pharmacy. Currently denies signs of infection. Denies shortness of breath or orthopnea. 5/21; patient presents for follow-up. We have been using Xeroform with antibiotic ointment under Kerlix/Coban to the left lower extremity. He has tolerated this well. He has no issues or complaints today. 5/30; patient presents for follow up. We have been using Xeroform and anitbiotic ointment under Kerlix/lower extremity. He took the wrap off due to increased itching to the periwound. Wounds are much smaller. No signs of infection. 6/7; we have been using Xeroform topical antibiotic under kerlix Coban. Unfortunately the patient simply will not leave the wrap in place. He came in with this rolled around his ankle. He says it is "itchy" his swelling is not well-controlled. 6/20; patient presents for follow-up. The dressing was changed at last clinic visit to Tubigrip and Hydrofera Blue to the left leg. This is much larger today. Unfortunately has developed a skin tear vs blister to the right lateral leg. 6/25; patient presents for follow-up. We have been using antibiotic ointment with Xeroform under Kerlix/Coban to the lower extremities bilaterally. Wounds are smaller. Unfortunately he keeps trying to take the wraps off.  We are trying to get him home health but have not heard back yet. 7/1; patient presents for follow-up. We have been using antibiotic ointment with Xeroform under Kerlix/Coban to the lower extremities bilaterally. Wounds are smaller. 7/12; patient presents for follow-up. We have been using antibiotic ointment with Xeroform under Kerlix/Coban to the lower extremities bilaterally. The wounds have healed. He has compression stockings at home. Patient History Information obtained from Patient, Chart. Family History Unknown History, Cancer - Father, Diabetes - Mother, Lung Disease - Mother. Social History Never smoker, Marital Status - Single, Alcohol Use - Never, Drug Use - No History, Caffeine Use - Daily - coffee. Medical History Eyes Patient has  history of Cataracts Denies history of Glaucoma, Optic Neuritis Cardiovascular Patient has history of Hypertension Endocrine Patient has history of Type II Diabetes Integumentary (Skin) Denies history of History of Burn Neurologic Patient has history of Seizure Disorder - CVA Denies history of Dementia, Neuropathy, Quadriplegia, Paraplegia Hospitalization/Surgery History - Loop Recorder insertion 01/23/2020. Medical A Surgical History Notes nd Eyes [name], [name] ([id number]) [id number] Page 5 of 7 Diabetic Retinopathy Hypertensive Retinopathy Left Homonymous Hemianopsia Cardiovascular Acute Cerebrovascular Accident Neurologic Memory Change Objective Constitutional respirations regular, non-labored and within target range for patient.. Vitals Time Taken: 8:15 AM, Height: 67 in, Weight: 193 lbs, BMI: 30.2, Temperature: 99.1 F, Pulse: 77 bpm, Respiratory Rate: 20 breaths/min, Blood Pressure: 153/67 mmHg. Cardiovascular 2+ dorsalis pedis/posterior tibialis pulses. Psychiatric pleasant and cooperative. General Notes: T lower extremities bilaterally there is epithelization to the previous wound sites. Decent  edema control. No signs of surrounding infection. o Integumentary (Hair, Skin) Wound #1 status is Open. Original cause of wound was Gradually Appeared. The date acquired was: 04/28/2023. The wound has been in treatment 8 weeks. The wound is located on the Left,Anterior Lower Leg. The wound measures 0cm length x 0cm width x 0cm depth; 0cm^2 area and 0cm^3 volume. There is no tunneling or undermining noted. There is a none present amount of drainage noted. The wound margin is distinct with the outline attached to the wound base. There is no granulation within the wound bed. There is no necrotic tissue within the wound bed. The periwound skin appearance did not exhibit: Callus, Crepitus, Excoriation, Induration, Rash, Scarring, Dry/Scaly, Maceration, Atrophie Blanche, Cyanosis, Ecchymosis, Hemosiderin Staining, Mottled, Pallor, Rubor, Erythema. Periwound temperature was noted as No Abnormality. The periwound has tenderness on palpation. Wound #2 status is Open. Original cause of wound was Gradually Appeared. The date acquired was: 06/10/2023. The wound has been in treatment 3 weeks. The wound is located on the Right,Lateral Lower Leg. The wound measures 0cm length x 0cm width x 0cm depth; 0cm^2 area and 0cm^3 volume. There is no tunneling or undermining noted. There is a none present amount of drainage noted. The wound margin is distinct with the outline attached to the wound base. There is no granulation within the wound bed. There is no necrotic tissue within the wound bed. The periwound skin appearance did not exhibit: Callus, Crepitus, Excoriation, Induration, Rash, Scarring, Dry/Scaly, Maceration, Atrophie Blanche, Cyanosis, Ecchymosis, Hemosiderin Staining, Mottled, Pallor, Rubor, Erythema. Periwound temperature was noted as No Abnormality. Assessment Active Problems ICD-10 Unspecified open wound, left lower leg, initial encounter Unspecified open wound, right lower leg, initial encounter Chronic  venous hypertension (idiopathic) with ulcer of bilateral lower extremity Lymphedema, not elsewhere classified Type 2 diabetes mellitus with other skin ulcer Chronic combined systolic (congestive) and diastolic (congestive) heart failure Patient has done well with antibiotic ointment and Xeroform under compression therapy. His wounds of healed. I recommended wearing compression stockings daily and elevating his legs when sitting for long periods of time. We will give him Tubigrip in office for now. Follow-up as needed. Plan Discharge From [facility] Services: Discharge from Wound Care Center - will apply tubrigrip size D to both legs to wear home. Once at home, remove tubigrip and apply compression stockings. Call wound center if you need aid in wound care in the future. Edema Control - Lymphedema / SCD / Other: Elevate legs to the level of the heart or above for 30 minutes daily and/or when sitting for 3-4 times a day throughout the day. Avoid standing for long periods of  time. Patient to wear own compression stockings every day. Exercise regularly Moisturize legs daily. Compression stocking or Garment 20-30 mm/Hg pressure to: Home Health: Discontinue home health for wound care. - [facility]- wounds are healed. [name], [name] ([id number]) [id number] Page 6 of 7 1. Daily compression stockings 2. Follow-up as needed 3. Discharge from clinic due to closed wounds Electronic Signature(s) Signed: 07/03/2023 9:53:20 AM By: Geralyn Corwin DO Entered By: Geralyn Corwin on 07/03/2023 08:38:59 -------------------------------------------------------------------------------- HxROS Details Patient Name: Date of Service: [name] 07/03/2023 8:00 A M Medical Record Number: [id number] Patient Account Number: [id number] Date of Birth/Sex: Treating RN: August 05, 1951 (72 y.o. M) Primary Care Provider: [name] Other Clinician: Referring  Provider: Treating Provider/Extender: Ebony Cargo in Treatment: 8 Information Obtained From Patient Chart Eyes Medical History: Positive for: Cataracts Negative for: Glaucoma; Optic Neuritis Past Medical History Notes: Diabetic Retinopathy Hypertensive Retinopathy Left Homonymous Hemianopsia Cardiovascular Medical History: Positive for: Hypertension Past Medical History Notes: Acute Cerebrovascular Accident Endocrine Medical History: Positive for: Type II Diabetes Treated with: Oral agents Blood sugar tested every day: Yes Tested : every day Integumentary (Skin) Medical History: Negative for: History of Burn Neurologic Medical History: Positive for: Seizure Disorder - CVA Negative for: Dementia; Neuropathy; Quadriplegia; Paraplegia Past Medical History Notes: Memory Change HBO Extended History Items Eyes: Cataracts Immunizations Pneumococcal Vaccine: Received Pneumococcal Vaccination: No Monte, Romuald ([id number]) [phone].pdf Page 7 of 7 Implantable Devices None Hospitalization / Surgery History Type of Hospitalization/Surgery Loop Recorder insertion 01/23/2020 Family and Social History Unknown History: Yes; Cancer: Yes - Father; Diabetes: Yes - Mother; Lung Disease: Yes - Mother; Never smoker; Marital Status - Single; Alcohol Use: Never; Drug Use: No History; Caffeine Use: Daily - coffee; Financial Concerns: No; Food, Clothing or Shelter Needs: No; Support System Lacking: No; Transportation Concerns: No Electronic Signature(s) Signed: 07/03/2023 9:53:20 AM By: Geralyn Corwin DO Entered By: Geralyn Corwin on 07/03/2023 08:37:29 -------------------------------------------------------------------------------- SuperBill Details Patient Name: Date of Service: KEITA, LAVIS [name] 07/03/2023 Medical Record Number: [id number] Patient Account Number: [id number] Date of Birth/Sex: Treating RN: [birth year] (72 y.o. [name], [name] Primary Care Provider: [name] Other Clinician: Referring Provider: Treating Provider/Extender: Ebony Cargo in Treatment: 8 Diagnosis Coding ICD-10 Codes Code Description [phone] Unspecified open wound, left lower leg, initial encounter S81.801A Unspecified open wound, right lower leg, initial encounter I87.313 Chronic venous hypertension (idiopathic) with ulcer of bilateral lower extremity I89.0 Lymphedema, not elsewhere classified E11.622 Type 2 diabetes mellitus with other skin ulcer I50.42 Chronic combined systolic (congestive) and diastolic (congestive) heart failure Facility Procedures : CPT4 Code: [id number] Description: 27062 - WOUND CARE VISIT-LEV 5 EST PT Modifier: Quantity: 1 Physician Procedures : CPT4 Code Description Modifier [id number] - WC PHYS LEVEL 3 - EST PT ICD-10 Diagnosis Description S81.802A Unspecified open wound, left lower leg, initial encounter S81.801A Unspecified open wound, right lower leg, initial encounter I87.313 Chronic  venous hypertension (idiopathic) with ulcer of bilateral lower extremity E11.622 Type 2 diabetes mellitus with other skin ulcer Quantity: 1 Electronic Signature(s) Signed: 07/03/2023 9:53:20 AM By: Geralyn Corwin DO Entered By: Geralyn Corwin on 07/03/2023 08:39:18

## 2023-07-03 NOTE — Progress Notes (Signed)
[name] ([id number]) [id number] Page 1 of 9 Visit Report for 07/03/2023 Arrival Information Details Patient Name: Date of Service: [name], [name] 07/03/2023 8:00 A M Medical Record Number: [id number] Patient Account Number: [id number] Date of Birth/Sex: Treating RN: 1951/02/09 (72 y.o. [name], Millard.Loa Primary Care Labrina Lines: [name] Other Clinician: Referring Reilley Latorre: Treating Eisley Barber/Extender: Ebony Cargo in Treatment: 8 Visit Information History Since Last Visit Added or deleted any medications: No Patient Arrived: Ambulatory Any new allergies or adverse reactions: No Arrival Time: 08:16 Had a fall or experienced change in No Accompanied By: daughter activities of daily living that may affect Transfer Assistance: None risk of falls: Patient Identification Verified: Yes Signs or symptoms of abuse/neglect since last visito No Secondary Verification Process Completed: Yes Hospitalized since last visit: No Patient Requires Transmission-Based Precautions: No Implantable device outside of the clinic excluding No Patient Has Alerts: No cellular tissue based products placed in the center since last visit: Has Dressing in Place as Prescribed: No Has Compression in Place as Prescribed: No Pain Present Now: No Notes per daughter patient's home health has not been out since Sunday. Physician made aware. Electronic Signature(s) Signed: 07/03/2023 1:53:12 PM By: Shawn Stall RN, BSN Entered By: Shawn Stall on 07/03/2023 08:16:55 -------------------------------------------------------------------------------- Clinic Level of Care Assessment Details Patient Name: Date of Service: [name], [name] 07/03/2023 8:00 A M Medical Record Number: [id number] Patient Account Number: [id number] Date of Birth/Sex: Treating RN: [birth year] (72 y.o. [name] Primary Care Bao Coreas: [name] Other Clinician: Referring  Luretha Eberly: Treating Kamare Caspers/Extender: Ebony Cargo in Treatment: 8 Clinic Level of Care Assessment Items TOOL 4 Quantity Score X- 1 0 Use when only an EandM is performed on FOLLOW-UP visit ASSESSMENTS - Nursing Assessment / Reassessment X- 1 10 Reassessment of Co-morbidities (includes updates in patient status) X- 1 5 Reassessment of Adherence to Treatment Plan ASSESSMENTS - Wound and Skin A ssessment / Reassessment []  - 0 Simple Wound Assessment / Reassessment - one wound X- 2 5 Complex Wound Assessment / Reassessment - multiple wounds X- 1 10 Dermatologic / Skin Assessment (not related to wound area) ASSESSMENTS - Focused Assessment Bitting, Josiyah ([id number]) [id number] Page 2 of 9 X- 2 5 Circumferential Edema Measurements - multi extremities []  - 0 Nutritional Assessment / Counseling / Intervention []  - 0 Lower Extremity Assessment (monofilament, tuning fork, pulses) []  - 0 Peripheral Arterial Disease Assessment (using hand held doppler) ASSESSMENTS - Ostomy and/or Continence Assessment and Care []  - 0 Incontinence Assessment and Management []  - 0 Ostomy Care Assessment and Management (repouching, etc.) PROCESS - Coordination of Care []  - 0 Simple Patient / Family Education for ongoing care X- 1 20 Complex (extensive) Patient / Family Education for ongoing care X- 1 10 Staff obtains Chiropractor, Records, T Results / Process Orders est X- 1 10 Staff telephones HHA, Nursing Homes / Clarify orders / etc []  - 0 Routine Transfer to another Facility (non-emergent condition) []  - 0 Routine Hospital Admission (non-emergent condition) []  - 0 New Admissions / Manufacturing engineer / Ordering NPWT Apligraf, etc. , []  - 0 Emergency Hospital Admission (emergent condition) []  - 0 Simple Discharge Coordination X- 1 15 Complex (extensive) Discharge Coordination PROCESS - Special Needs []  - 0 Pediatric / Minor Patient  Management []  - 0 Isolation Patient Management []  - 0 Hearing / Language / Visual special needs []  - 0 Assessment of Community assistance (transportation, D/C planning, etc.) []  - 0 Additional assistance / Altered mentation []  - 0  Support Surface(s) Assessment (bed, cushion, seat, etc.) INTERVENTIONS - Wound Cleansing / Measurement []  - 0 Simple Wound Cleansing - one wound X- 2 5 Complex Wound Cleansing - multiple wounds X- 1 5 Wound Imaging (photographs - any number of wounds) []  - 0 Wound Tracing (instead of photographs) []  - 0 Simple Wound Measurement - one wound X- 2 5 Complex Wound Measurement - multiple wounds INTERVENTIONS - Wound Dressings []  - 0 Small Wound Dressing one or multiple wounds []  - 0 Medium Wound Dressing one or multiple wounds X- 2 20 Large Wound Dressing one or multiple wounds []  - 0 Application of Medications - topical []  - 0 Application of Medications - injection INTERVENTIONS - Miscellaneous []  - 0 External ear exam []  - 0 Specimen Collection (cultures, biopsies, blood, body fluids, etc.) []  - 0 Specimen(s) / Culture(s) sent or taken to Lab for analysis []  - 0 Patient Transfer (multiple staff / Michiel Sites Lift / Similar devices) []  - 0 Simple Staple / Suture removal (25 or less) [name], [name] ([id number]) [id number] Page 3 of 9 []  - 0 Complex Staple / Suture removal (26 or more) []  - 0 Hypo / Hyperglycemic Management (close monitor of Blood Glucose) []  - 0 Ankle / Brachial Index (ABI) - do not check if billed separately X- 1 5 Vital Signs Has the patient been seen at the hospital within the last three years: Yes Total Score: 170 Level Of Care: New/Established - Level 5 Electronic Signature(s) Signed: 07/03/2023 1:53:12 PM By: Shawn Stall RN, BSN Entered By: Shawn Stall on 07/03/2023 08:36:21 -------------------------------------------------------------------------------- Encounter Discharge Information  Details Patient Name: Date of Service: [name] 07/03/2023 8:00 A M Medical Record Number: [id number] Patient Account Number: [id number] Date of Birth/Sex: Treating RN: [birth year] (72 y.o. [name] Primary Care Remy Voiles: [name] Other Clinician: Referring Evelette Hollern: Treating Alissa Pharr/Extender: Ebony Cargo in Treatment: 8 Encounter Discharge Information Items Discharge Condition: Stable Ambulatory Status: Ambulatory Discharge Destination: Home Transportation: Private Auto Accompanied By: daughter Schedule Follow-up Appointment: No Clinical Summary of Care: Notes tubigrip size D applied to both legs. Electronic Signature(s) Signed: 07/03/2023 1:53:12 PM By: Shawn Stall RN, BSN Entered By: Shawn Stall on 07/03/2023 08:37:04 -------------------------------------------------------------------------------- Lower Extremity Assessment Details Patient Name: Date of Service: [name], [name] 07/03/2023 8:00 A M Medical Record Number: [id number] Patient Account Number: [id number] Date of Birth/Sex: Treating RN: [birth year] (72 y.o. [name] Primary Care Aneisha Skyles: [name] Other Clinician: Referring Jamarquis Crull: Treating Melanie Openshaw/Extender: Loraine Maple Weeks in Treatment: 8 Edema Assessment Assessed: Kyra Searles: Yes] [Right: Yes] Edema: [Left: Yes] [Right: Yes] Calf Left: Right: Point of Measurement: From Medial Instep 36 cm 37 cm Naim, Karl ([id number]) [id number] Page 4 of 9 Ankle Left: Right: Point of Measurement: From Medial Instep 22 cm 22 cm Vascular Assessment Pulses: Dorsalis Pedis Palpable: [Left:Yes] [Right:Yes] Extremity colors, hair growth, and conditions: Extremity Color: [Left:Hyperpigmented] [Right:Hyperpigmented] Hair Growth on Extremity: [Left:No] [Right:No] Temperature of Extremity: [Left:Warm] [Right:Warm] Capillary Refill: [Left:< 3 seconds] [Right:< 3  seconds] Dependent Rubor: [Left:No] [Right:No] Blanched when Elevated: [Left:No No] [Right:No No] Toe Nail Assessment Left: Right: Thick: Yes Yes Discolored: Yes Yes Deformed: Yes Yes Improper Length and Hygiene: No No Electronic Signature(s) Signed: 07/03/2023 1:53:12 PM By: Shawn Stall RN, BSN Entered By: Shawn Stall on 07/03/2023 08:22:34 -------------------------------------------------------------------------------- Multi Wound Chart Details Patient Name: Date of Service: [name] 07/03/2023 8:00 A M Medical Record Number: [id number] Patient Account Number: [id number] Date of Birth/Sex: Treating RN: [birth year] (72 y.o. M) Primary  Care Litisha Guagliardo: [name] Other Clinician: Referring Bettylou Frew: Treating Chantale Leugers/Extender: Ebony Cargo in Treatment: 8 Vital Signs Height(in): 67 Pulse(bpm): 77 Weight(lbs): 193 Blood Pressure(mmHg): 153/67 Body Mass Index(BMI): 30.2 Temperature(F): 99.1 Respiratory Rate(breaths/min): 20 [1:Photos:] [N/A:N/A] Left, Anterior Lower Leg Right, Lateral Lower Leg N/A Wound Location: Gradually Appeared Gradually Appeared N/A Wounding Event: Diabetic Wound/Ulcer of the Lower Diabetic Wound/Ulcer of the Lower N/A Primary Etiology: Extremity Extremity Cataracts, Hypertension, Type II Cataracts, Hypertension, Type II N/A Comorbid History: Diabetes, Seizure Disorder Diabetes, Seizure Disorder 04/28/2023 06/10/2023 N/A Date Acquired: 8 3 N/A Weeks of Treatment: Open Open N/A Wound Status: No No N/A Wound Recurrence: Espaillat, Tayton ([id number]) [id number] Page 5 of 9 Yes No N/A Clustered Wound: 0 N/A N/A Clustered Quantity: 0x0x0 0x0x0 N/A Measurements L x W x D (cm) 0 0 N/A A (cm) : rea 0 0 N/A Volume (cm) : 100.00% 100.00% N/A % Reduction in A rea: 100.00% 100.00% N/A % Reduction in Volume: Grade 2 Grade 1 N/A Classification: None Present None Present N/A Exudate A  mount: Distinct, outline attached Distinct, outline attached N/A Wound Margin: None Present (0%) None Present (0%) N/A Granulation A mount: None Present (0%) None Present (0%) N/A Necrotic A mount: Fascia: No Fascia: No N/A Exposed Structures: Fat Layer (Subcutaneous Tissue): No Fat Layer (Subcutaneous Tissue): No Tendon: No Tendon: No Muscle: No Muscle: No Joint: No Joint: No Bone: No Bone: No Large (67-100%) Large (67-100%) N/A Epithelialization: Excoriation: No Excoriation: No N/A Periwound Skin Texture: Induration: No Induration: No Callus: No Callus: No Crepitus: No Crepitus: No Rash: No Rash: No Scarring: No Scarring: No Maceration: No Maceration: No N/A Periwound Skin Moisture: Dry/Scaly: No Dry/Scaly: No Atrophie Blanche: No Atrophie Blanche: No N/A Periwound Skin Color: Cyanosis: No Cyanosis: No Ecchymosis: No Ecchymosis: No Erythema: No Erythema: No Hemosiderin Staining: No Hemosiderin Staining: No Mottled: No Mottled: No Pallor: No Pallor: No Rubor: No Rubor: No No Abnormality No Abnormality N/A Temperature: Yes N/A N/A Tenderness on Palpation: Treatment Notes Electronic Signature(s) Signed: 07/03/2023 9:53:20 AM By: Geralyn Corwin DO Entered By: Geralyn Corwin on 07/03/2023 08:36:49 -------------------------------------------------------------------------------- Multi-Disciplinary Care Plan Details Patient Name: Date of Service: ALBY, BURRUEL [name] 07/03/2023 8:00 A M Medical Record Number: [id number] Patient Account Number: [id number] Date of Birth/Sex: Treating RN: 1951-11-02 (72 y.o. [name] Primary Care Oneka Parada: [name] Other Clinician: Referring Araceli Arango: Treating Jonni Oelkers/Extender: Ebony Cargo in Treatment: 8 Active Inactive Electronic Signature(s) Signed: 07/03/2023 1:53:12 PM By: Shawn Stall RN, BSN Entered By: Shawn Stall on 07/03/2023 08:24:50 Pain Assessment  Details -------------------------------------------------------------------------------- Durene Fruits ([id number]) [id number] Page 6 of 9 Patient Name: Date of Service: [name], [name] 07/03/2023 8:00 A M Medical Record Number: [id number] Patient Account Number: [id number] Date of Birth/Sex: Treating RN: [birth year] (72 y.o. [name] Primary Care Charlette Hennings: [name] Other Clinician: Referring Amra Shukla: Treating Kira Hartl/Extender: Ebony Cargo in Treatment: 8 Active Problems Location of Pain Severity and Description of Pain Patient Has Paino No Site Locations Rate the pain. Current Pain Level: 0 Pain Management and Medication Current Pain Management: Electronic Signature(s) Signed: 07/03/2023 1:53:12 PM By: Shawn Stall RN, BSN Entered By: Shawn Stall on 07/03/2023 08:17:15 -------------------------------------------------------------------------------- Patient/Caregiver Education Details Patient Name: Date of Service: [name] 7/12/2024andnbsp8:00 A M Medical Record Number: [id number] Patient Account Number: [id number] Date of Birth/Gender: Treating RN: November 29, 1951 (72 y.o. [name] Primary Care Physician: [name] Other Clinician: Referring Physician: Treating Physician/Extender: Ebony Cargo in Treatment: 8  Education Assessment Education Provided To: Patient and Caregiver Education Topics Provided Venous: Handouts: Controlling Swelling with Compression Stockings Methods: Explain/Verbal Responses: Reinforcements needed Electronic Signature(s) Signed: 07/03/2023 1:53:12 PM By: Shawn Stall RN, BSN Entered By: Shawn Stall on 07/03/2023 08:25:03 Stankowski, Jacqlyn Krauss ([id number]) [id number] Page 7 of 9 -------------------------------------------------------------------------------- Wound Assessment Details Patient Name: Date of  Service: [name], [name] 07/03/2023 8:00 A M Medical Record Number: [id number] Patient Account Number: [id number] Date of Birth/Sex: Treating RN: 03-Dec-1951 (72 y.o. [name], Millard.Loa Primary Care Tirrell Buchberger: [name] Other Clinician: Referring Deserea Bordley: Treating Venus Ruhe/Extender: Ebony Cargo in Treatment: 8 Wound Status Wound Number: 1 Primary Etiology: Diabetic Wound/Ulcer of the Lower Extremity Wound Location: Left, Anterior Lower Leg Wound Status: Open Wounding Event: Gradually Appeared Comorbid Cataracts, Hypertension, Type II Diabetes, Seizure History: Disorder Date Acquired: 04/28/2023 Weeks Of Treatment: 8 Clustered Wound: Yes Photos Wound Measurements Length: (cm) Width: (cm) Depth: (cm) Clustered Quantity: Area: (cm) Volume: (cm) 0 % Reduction in Area: 100% 0 % Reduction in Volume: 100% 0 Epithelialization: Large (67-100%) 0 Tunneling: No 0 Undermining: No 0 Wound Description Classification: Grade 2 Wound Margin: Distinct, outline attached Exudate Amount: None Present Foul Odor After Cleansing: No Slough/Fibrino No Wound Bed Granulation Amount: None Present (0%) Exposed Structure Necrotic Amount: None Present (0%) Fascia Exposed: No Fat Layer (Subcutaneous Tissue) Exposed: No Tendon Exposed: No Muscle Exposed: No Joint Exposed: No Bone Exposed: No Periwound Skin Texture Texture Color No Abnormalities Noted: No No Abnormalities Noted: No Callus: No Atrophie Blanche: No Crepitus: No Cyanosis: No Excoriation: No Ecchymosis: No Induration: No Erythema: No Rash: No Hemosiderin Staining: No Scarring: No Mottled: No Pallor: No Moisture Rubor: No No Abnormalities Noted: No Dry / Scaly: No Temperature / Pain Maceration: No Temperature: No Abnormality Harroun, Pardeep ([id number]) [id number] Page 8 of 9 Tenderness on Palpation: Yes Electronic Signature(s) Signed: 07/03/2023 1:48:17 PM By:  Karie Schwalbe RN Signed: 07/03/2023 1:53:12 PM By: Shawn Stall RN, BSN Entered By: Karie Schwalbe on 07/03/2023 08:24:11 -------------------------------------------------------------------------------- Wound Assessment Details Patient Name: Date of Service: [name], [name] 07/03/2023 8:00 A M Medical Record Number: [id number] Patient Account Number: [id number] Date of Birth/Sex: Treating RN: [birth year] (72 y.o. [name], Millard.Loa Primary Care Justeen Hehr: [name] Other Clinician: Referring Viyan Rosamond: Treating Jilda Kress/Extender: Ebony Cargo in Treatment: 8 Wound Status Wound Number: 2 Primary Etiology: Diabetic Wound/Ulcer of the Lower Extremity Wound Location: Right, Lateral Lower Leg Wound Status: Open Wounding Event: Gradually Appeared Comorbid Cataracts, Hypertension, Type II Diabetes, Seizure History: Disorder Date Acquired: 06/10/2023 Weeks Of Treatment: 3 Clustered Wound: No Photos Wound Measurements Length: (cm) Width: (cm) Depth: (cm) Area: (cm) Volume: (cm) 0 % Reduction in Area: 100% 0 % Reduction in Volume: 100% 0 Epithelialization: Large (67-100%) 0 Tunneling: No 0 Undermining: No Wound Description Classification: Grade 1 Wound Margin: Distinct, outline attached Exudate Amount: None Present Foul Odor After Cleansing: No Slough/Fibrino No Wound Bed Granulation Amount: None Present (0%) Exposed Structure Necrotic Amount: None Present (0%) Fascia Exposed: No Fat Layer (Subcutaneous Tissue) Exposed: No Tendon Exposed: No Muscle Exposed: No Joint Exposed: No Bone Exposed: No Periwound Skin Texture Texture Color No Abnormalities Noted: No No Abnormalities Noted: No Callus: No Atrophie Blanche: No Crepitus: No Cyanosis: No Excoriation: No Ecchymosis: No Wardrop, Davion ([id number]) [id number] Page 9 of 9 Induration: No Erythema: No Rash: No Hemosiderin Staining: No Scarring: No Mottled:  No Pallor: No Moisture Rubor: No No Abnormalities Noted: No Dry / Scaly: No Temperature / Pain Maceration: No Temperature: No  Abnormality Electronic Signature(s) Signed: 07/03/2023 1:48:17 PM By: Karie Schwalbe RN Signed: 07/03/2023 1:53:12 PM By: Shawn Stall RN, BSN Entered By: Karie Schwalbe on 07/03/2023 08:24:37 -------------------------------------------------------------------------------- Vitals Details Patient Name: Date of Service: [name], [name] 07/03/2023 8:00 A M Medical Record Number: [id number] Patient Account Number: [id number] Date of Birth/Sex: Treating RN: [birth year] (72 y.o. [name] Primary Care Nashley Cordoba: [name] Other Clinician: Referring Angelito Hopping: Treating Wilho Sharpley/Extender: Ebony Cargo in Treatment: 8 Vital Signs Time Taken: 08:15 Temperature (F): 99.1 Height (in): 67 Pulse (bpm): 77 Weight (lbs): 193 Respiratory Rate (breaths/min): 20 Body Mass Index (BMI): 30.2 Blood Pressure (mmHg): 153/67 Reference Range: 80 - 120 mg / dl Electronic Signature(s) Signed: 07/03/2023 1:53:12 PM By: Shawn Stall RN, BSN Entered By: Shawn Stall on 07/03/2023 08:17:08

## 2023-07-03 NOTE — Progress Notes (Signed)
[facility] Summary Report / Loop Recorder 

## 2023-07-06 ENCOUNTER — Encounter (INDEPENDENT_AMBULATORY_CARE_PROVIDER_SITE_OTHER): Payer: Self-pay | Admitting: Ophthalmology

## 2023-07-06 ENCOUNTER — Ambulatory Visit (INDEPENDENT_AMBULATORY_CARE_PROVIDER_SITE_OTHER): Payer: Medicare HMO | Admitting: Ophthalmology

## 2023-07-06 DIAGNOSIS — Z961 Presence of intraocular lens: Secondary | ICD-10-CM

## 2023-07-06 DIAGNOSIS — H25813 Combined forms of age-related cataract, bilateral: Secondary | ICD-10-CM

## 2023-07-06 DIAGNOSIS — H35033 Hypertensive retinopathy, bilateral: Secondary | ICD-10-CM | POA: Diagnosis not present

## 2023-07-06 DIAGNOSIS — H04123 Dry eye syndrome of bilateral lacrimal glands: Secondary | ICD-10-CM

## 2023-07-06 DIAGNOSIS — E113313 Type 2 diabetes mellitus with moderate nonproliferative diabetic retinopathy with macular edema, bilateral: Secondary | ICD-10-CM | POA: Diagnosis not present

## 2023-07-06 DIAGNOSIS — H25812 Combined forms of age-related cataract, left eye: Secondary | ICD-10-CM

## 2023-07-06 DIAGNOSIS — I10 Essential (primary) hypertension: Secondary | ICD-10-CM

## 2023-07-06 DIAGNOSIS — H33321 Round hole, right eye: Secondary | ICD-10-CM

## 2023-07-06 MED ORDER — BEVACIZUMAB CHEMO INJECTION 1.25MG/0.05ML SYRINGE FOR KALEIDOSCOPE
1.2500 mg | INTRAVITREAL | Status: AC | PRN
Start: 2023-07-06 — End: 2023-07-06
  Administered 2023-07-06: 1.25 mg via INTRAVITREAL

## 2023-07-08 NOTE — Progress Notes (Signed)
Herd, Smith ([id number]) [id number] Page 1 of 8 Visit Report for 06/22/2023 Arrival Information Details Patient Name: Date of Service: [name], [name] ESTER 06/22/2023 3:30 PM Medical Record Number: [id number] Patient Account Number: [id number] Date of Birth/Sex: Treating RN: 1951/11/02 (72 y.o. M) Primary Care [name]: [name] Other Clinician: Referring [name]: Treating [name]/Extender: [name] in Treatment: 6 Visit Information History Since Last Visit Added or deleted any medications: No Patient Arrived: Ambulatory Any new allergies or adverse reactions: No Arrival Time: 15:36 Had a fall or experienced change in No Accompanied By: daughter activities of daily living that may affect Transfer Assistance: None risk of falls: Patient Identification Verified: Yes Signs or symptoms of abuse/neglect since last visito No Secondary Verification Process Completed: Yes Hospitalized since last visit: No Patient Requires Transmission-Based Precautions: No Implantable device outside of the clinic excluding No Patient Has Alerts: No cellular tissue based products placed in the center since last visit: Has Dressing in Place as Prescribed: Yes Has Compression in Place as Prescribed: Yes Pain Present Now: No Electronic Signature(s) Signed: 06/22/2023 4:27:23 PM By: [name] Entered By: [name] on 06/22/2023 15:39:40 -------------------------------------------------------------------------------- Compression Therapy Details Patient Name: Date of Service: [name], [name] ESTER 06/22/2023 3:30 PM Medical Record Number: [id number] Patient Account Number: [id number] Date of Birth/Sex: Treating RN: 1951-03-11 (72 y.o. [name] Primary Care Ayven Glasco: [name] Other Clinician: Referring [name]: Treating Malaia Buchta/Extender: [name] in Treatment: 6 Compression Therapy Performed for Wound  Assessment: Wound #1 Left,Anterior Lower Leg Performed By: Clinician [name] Grills, RN Compression Type: Three Layer Post Procedure Diagnosis Same as Pre-procedure Notes Scribed for Dr [name] by [name] Grills RN. Electronic Signature(s) Signed: 07/08/2023 7:59:22 AM By: [name] Entered By: [name] on 06/22/2023 16:34:57 [name], [name] ([id number]) [id number] Page 2 of 8 -------------------------------------------------------------------------------- Compression Therapy Details Patient Name: Date of Service: [name] ESTER 06/22/2023 3:30 PM Medical Record Number: [id number] Patient Account Number: [id number] Date of Birth/Sex: Treating RN: [birth year] (72 y.o. [name] Primary Care Danilo Cappiello: [name] Other Clinician: Referring [name]: Treating [name]/Extender: [name] in Treatment: 6 Compression Therapy Performed for Wound Assessment: Wound #2 Right,Lateral Lower Leg Performed By: Clinician [name] Grills, RN Compression Type: Three Layer Post Procedure Diagnosis Same as Pre-procedure Notes Scribed for Dr [name] by [name] Grills RN. Electronic Signature(s) Signed: 07/08/2023 7:59:22 AM By: [name] Entered By: [name] on 06/22/2023 16:34:57 -------------------------------------------------------------------------------- Encounter Discharge Information Details Patient Name: Date of Service: [name], [name] ESTER 06/22/2023 3:30 PM Medical Record Number: [id number] Patient Account Number: [id number] Date of Birth/Sex: Treating RN: [birth year] (72 y.o. [name] Primary Care Keysi Oelkers: [name] Other Clinician: Referring [name]: Treating [name]/Extender: [name] in Treatment: 6 Encounter Discharge Information Items Discharge Condition: Stable Ambulatory Status: Ambulatory Discharge Destination: Home Transportation: Private  Auto Accompanied By: daughter Schedule Follow-up Appointment: Yes Clinical Summary of Care: Patient Declined Electronic Signature(s) Signed: 07/08/2023 7:59:22 AM By: [name] Entered By: [name] on 06/22/2023 16:36:15 -------------------------------------------------------------------------------- Lower Extremity Assessment Details Patient Name: Date of Service: [name], [name] ESTER 06/22/2023 3:30 PM Medical Record Number: [id number] Patient Account Number: [id number] Date of Birth/Sex: Treating RN: 1951/06/15 (72 y.o. M) Primary Care Laakea Pereira: [name] Other Clinician: Referring Djuna Frechette: Treating [name]/Extender: [name], [name] ([id number]) [id number] Page 3 of 8 Weeks in Treatment: 6 Edema Assessment Assessed: [Left: No] [Right: No] Edema: [Left: Ye] [Right: s] Calf Left: Right: Point of Measurement: From Medial Instep 38 cm 39.5 cm  Ankle Left: Right: Point of Measurement: From Medial Instep 23.5 cm 23 cm Electronic Signature(s) Signed: 06/22/2023 4:27:23 PM By: [name] Entered By: [name] on 06/22/2023 16:01:00 -------------------------------------------------------------------------------- Multi Wound Chart Details Patient Name: Date of Service: [name], [name] ESTER 06/22/2023 3:30 PM Medical Record Number: [id number] Patient Account Number: [id number] Date of Birth/Sex: Treating RN: 06-05-1951 (72 y.o. M) Primary Care Ellin Fitzgibbons: [name] Other Clinician: Referring Sala Tague: Treating Carsen Leaf/Extender: [name] in Treatment: 6 Vital Signs Height(in): 67 Pulse(bpm): 55 Weight(lbs): 193 Blood Pressure(mmHg): 120/66 Body Mass Index(BMI): 30.2 Temperature(F): 98.6 Respiratory Rate(breaths/min): 18 [1:Photos:] [N/A:N/A] Left, Anterior Lower Leg Right, Lateral Lower Leg N/A Wound Location: Gradually Appeared Gradually Appeared N/A Wounding Event: Diabetic  Wound/Ulcer of the Lower Diabetic Wound/Ulcer of the Lower N/A Primary Etiology: Extremity Extremity Cataracts, Hypertension, Type II Cataracts, Hypertension, Type II N/A Comorbid History: Diabetes, Seizure Disorder Diabetes, Seizure Disorder 04/28/2023 06/10/2023 N/A Date Acquired: 6 1 N/A Weeks of Treatment: Open Open N/A Wound Status: No No N/A Wound Recurrence: Yes No N/A Clustered Wound: 2 N/A N/A Clustered Quantity: 2x1x0.1 3.5x2.5x0.1 N/A Measurements L x W x D (cm) 1.571 6.872 N/A A (cm) : rea 0.157 0.687 N/A Volume (cm) : 99.30% 38.70% N/A % Reduction in A rea: 99.30% 38.80% N/A % Reduction in Volume: Grade 2 Grade 1 N/A Classification: Medium Medium N/A Exudate A mount: Serosanguineous Serosanguineous N/A Exudate Type: red, brown red, brown N/A Exudate Color: [name], [name] ([id number]) [id number] Page 4 of 8 Distinct, outline attached Distinct, outline attached N/A Wound Margin: Large (67-100%) Large (67-100%) N/A Granulation Amount: Pink, Pale Red N/A Granulation Quality: None Present (0%) None Present (0%) N/A Necrotic Amount: Fat Layer (Subcutaneous Tissue): Yes Fat Layer (Subcutaneous Tissue): Yes N/A Exposed Structures: Fascia: No Tendon: No Muscle: No Joint: No Bone: No Large (67-100%) Small (1-33%) N/A Epithelialization: Callus: Yes Excoriation: No N/A Periwound Skin Texture: Scarring: Yes Induration: No Excoriation: No Callus: No Induration: No Crepitus: No Crepitus: No Rash: No Rash: No Scarring: No Maceration: No Maceration: No N/A Periwound Skin Moisture: Dry/Scaly: No Dry/Scaly: No Atrophie Blanche: No Atrophie Blanche: No N/A Periwound Skin Color: Cyanosis: No Cyanosis: No Ecchymosis: No Ecchymosis: No Erythema: No Erythema: No Hemosiderin Staining: No Hemosiderin Staining: No Mottled: No Mottled: No Pallor: No Pallor: No Rubor: No Rubor: No No Abnormality No Abnormality  N/A Temperature: Yes N/A N/A Tenderness on Palpation: Treatment Notes Electronic Signature(s) Signed: 06/22/2023 4:20:44 PM By: Geralyn Corwin DO Entered By: Geralyn Corwin on 06/22/2023 16:13:39 -------------------------------------------------------------------------------- Multi-Disciplinary Care Plan Details Patient Name: Date of Service: [name], [name] ESTER 06/22/2023 3:30 PM Medical Record Number: [id number] Patient Account Number: [id number] Date of Birth/Sex: Treating RN: July 20, 1951 (72 y.o. [name] Primary Care Yaakov Saindon: [name] Other Clinician: Referring Jaicee Michelotti: Treating Yexalen Deike/Extender: [name] in Treatment: 6 Active Inactive Wound/Skin Impairment Nursing Diagnoses: Impaired tissue integrity Goals: Patient/caregiver will verbalize understanding of skin care regimen Date Initiated: 05/05/2023 Target Resolution Date: 07/24/2023 Goal Status: Active Interventions: Assess patient/caregiver ability to obtain necessary supplies Assess patient/caregiver ability to perform ulcer/skin care regimen upon admission and as needed Assess ulceration(s) every visit Provide education on ulcer and skin care Screen for HBO Treatment Activities: Skin care regimen initiated : 05/05/2023 Topical wound management initiated : 05/05/2023 Notes: Mace, [place] ([id number]) [id number] Page 5 of 8 Electronic Signature(s) Signed: 07/08/2023 7:59:22 AM By: [name] Entered By: [name] on 06/22/2023 16:09:58 -------------------------------------------------------------------------------- Pain Assessment Details Patient Name: Date of Service: [name], [name] ESTER 06/22/2023 3:30 PM Medical Record  Number: [id number] Patient Account Number: [id number] Date of Birth/Sex: Treating RN: 12-Jan-1951 (72 y.o. M) Primary Care Lasonia Casino: [name] Other Clinician: Referring Akeylah Hendel: Treating Nanetta Wiegman/Extender: [name] in Treatment: 6 Active Problems Location of Pain Severity and Description of Pain Patient Has Paino No Site Locations Pain Management and Medication Current Pain Management: Electronic Signature(s) Signed: 06/22/2023 4:27:23 PM By: [name] Entered By: [name] on 06/22/2023 16:00:42 -------------------------------------------------------------------------------- Patient/Caregiver Education Details Patient Name: Date of Service: [name], [name] ESTER 7/1/2024andnbsp3:30 PM Medical Record Number: [id number] Patient Account Number: [id number] Date of Birth/Gender: Treating RN: 01-13-1951 (73 y.o. [name] Primary Care Physician: [name] Other Clinician: Referring Physician: Treating Physician/Extender: [name] in Treatment: 6 Education Assessment Education Provided To: Finger, Ad ([id number]) [id number] Page 6 of 8 Patient and Caregiver Education Topics Provided Wound/Skin Impairment: Methods: Explain/Verbal Responses: State content correctly Electronic Signature(s) Signed: 07/08/2023 7:59:22 AM By: [name] Entered By: [name] on 06/22/2023 16:10:20 -------------------------------------------------------------------------------- Wound Assessment Details Patient Name: Date of Service: [name], [name] ESTER 06/22/2023 3:30 PM Medical Record Number: [id number] Patient Account Number: [id number] Date of Birth/Sex: Treating RN: [birth year] (72 y.o. M) Primary Care Darby Fleeman: [name] Other Clinician: Referring Layza Summa: Treating Cassy Sprowl/Extender: [name] in Treatment: 6 Wound Status Wound Number: 1 Primary Etiology: Diabetic Wound/Ulcer of the Lower Extremity Wound Location: Left, Anterior Lower Leg Wound Status: Open Wounding Event: Gradually Appeared Comorbid Cataracts, Hypertension, Type II Diabetes, Seizure History:  Disorder Date Acquired: 04/28/2023 Weeks Of Treatment: 6 Clustered Wound: Yes Photos Wound Measurements Length: (cm) Width: (cm) Depth: (cm) Clustered Quantity: Area: (cm) Volume: (cm) 2 % Reduction in Area: 99.3% 1 % Reduction in Volume: 99.3% 0.1 Epithelialization: Large (67-100%) 2 Tunneling: No 1.571 Undermining: No 0.157 Wound Description Classification: Grade 2 Wound Margin: Distinct, outline attached Exudate Amount: Medium Exudate Type: Serosanguineous Exudate Color: red, brown Foul Odor After Cleansing: No Slough/Fibrino Yes Wound Bed Granulation Amount: Large (67-100%) Exposed Structure Granulation Quality: Pink, Pale Fat Layer (Subcutaneous Tissue) Exposed: Yes Necrotic Amount: None Present (0%) Periwound Skin Texture Sylve, Emma ([id number]) [id number] Page 7 of 8 Texture Color No Abnormalities Noted: No No Abnormalities Noted: No Callus: Yes Atrophie Blanche: No Crepitus: No Cyanosis: No Excoriation: No Ecchymosis: No Induration: No Erythema: No Rash: No Hemosiderin Staining: No Scarring: Yes Mottled: No Pallor: No Moisture Rubor: No No Abnormalities Noted: No Dry / Scaly: No Temperature / Pain Maceration: No Temperature: No Abnormality Tenderness on Palpation: Yes Electronic Signature(s) Signed: 06/22/2023 4:27:23 PM By: [name] Entered By: [name] on 06/22/2023 16:03:03 -------------------------------------------------------------------------------- Wound Assessment Details Patient Name: Date of Service: [name], CHRISCOE ESTER 06/22/2023 3:30 PM Medical Record Number: [id number] Patient Account Number: [id number] Date of Birth/Sex: Treating RN: 04/12/1951 (72 y.o. M) Primary Care Shae Hinnenkamp: [name] Other Clinician: Referring Sady Monaco: Treating Tyshawn Ciullo/Extender: [name] in Treatment: 6 Wound Status Wound Number: 2 Primary Etiology: Diabetic Wound/Ulcer of the Lower  Extremity Wound Location: Right, Lateral Lower Leg Wound Status: Open Wounding Event: Gradually Appeared Comorbid Cataracts, Hypertension, Type II Diabetes, Seizure History: Disorder Date Acquired: 06/10/2023 Weeks Of Treatment: 1 Clustered Wound: No Photos Wound Measurements Length: (cm) 3.5 Width: (cm) 2.5 Depth: (cm) 0.1 Area: (cm) 6.872 Volume: (cm) 0.687 % Reduction in Area: 38.7% % Reduction in Volume: 38.8% Epithelialization: Small (1-33%) Tunneling: No Undermining: No Wound Description Classification: Grade 1 Wound Margin: Distinct, outline attached Exudate Amount: Medium Exudate Type: Serosanguineous Exudate Color: red, brown Foul Odor After Cleansing: No Slough/Fibrino  Yes Wound Bed Granulation Amount: Large (67-100%) Exposed Structure Coley, Ambrosio ([id number]) [id number].pdf Page 8 of 8 Granulation Quality: Red Fascia Exposed: No Necrotic Amount: None Present (0%) Fat Layer (Subcutaneous Tissue) Exposed: Yes Tendon Exposed: No Muscle Exposed: No Joint Exposed: No Bone Exposed: No Periwound Skin Texture Texture Color No Abnormalities Noted: No No Abnormalities Noted: No Callus: No Atrophie Blanche: No Crepitus: No Cyanosis: No Excoriation: No Ecchymosis: No Induration: No Erythema: No Rash: No Hemosiderin Staining: No Scarring: No Mottled: No Pallor: No Moisture Rubor: No No Abnormalities Noted: No Dry / Scaly: No Temperature / Pain Maceration: No Temperature: No Abnormality Electronic Signature(s) Signed: 06/22/2023 4:27:23 PM By: [name] Entered By: [name] on 06/22/2023 16:03:41 -------------------------------------------------------------------------------- Vitals Details Patient Name: Date of Service: DARRICK, GREENLAW ESTER 06/22/2023 3:30 PM Medical Record Number: [id number] Patient Account Number: [id number] Date of Birth/Sex: Treating RN: 12/23/1950 (72 y.o. M) Primary Care Qiara Minetti: [name]  Other Clinician: Referring Ianmichael Amescua: Treating Kaysha Parsell/Extender: [name] in Treatment: 6 Vital Signs Time Taken: 15:39 Temperature (F): 98.6 Height (in): 67 Pulse (bpm): 55 Weight (lbs): 193 Respiratory Rate (breaths/min): 18 Body Mass Index (BMI): 30.2 Blood Pressure (mmHg): 120/66 Reference Range: 80 - 120 mg / dl Electronic Signature(s) Signed: 06/22/2023 4:27:23 PM By: [name] Entered By: [name] on 06/22/2023 15:41:01

## 2023-07-20 ENCOUNTER — Encounter (HOSPITAL_BASED_OUTPATIENT_CLINIC_OR_DEPARTMENT_OTHER): Payer: Medicare HMO | Admitting: Internal Medicine

## 2023-07-20 ENCOUNTER — Ambulatory Visit (INDEPENDENT_AMBULATORY_CARE_PROVIDER_SITE_OTHER): Payer: Medicare HMO

## 2023-07-20 DIAGNOSIS — I890 Lymphedema, not elsewhere classified: Secondary | ICD-10-CM | POA: Diagnosis not present

## 2023-07-20 DIAGNOSIS — E11622 Type 2 diabetes mellitus with other skin ulcer: Secondary | ICD-10-CM | POA: Diagnosis not present

## 2023-07-20 DIAGNOSIS — R55 Syncope and collapse: Secondary | ICD-10-CM | POA: Diagnosis not present

## 2023-07-21 NOTE — Progress Notes (Signed)
[name] [name], [name] ([id number]) [id number] Page 1 of 7 Visit Report for 07/20/2023 Chief Complaint Document Details Patient Name: Date of Service: [name], [name] [name] [name] [name] 07/20/2023 1:15 PM Medical Record Number: [id number] Patient Account Number: [id number] Date of Birth/Sex: Treating RN: February 26, 1951 (72 y.o. M) Primary Care Provider: Fatima [name] Other Clinician: Referring Provider: Treating Provider/Extender: [name] [name] [name] in Treatment: 10 Information Obtained from: Patient Chief Complaint 05/05/2023; left lower extremity wound Electronic Signature(s) Signed: 07/20/2023 5:19:21 PM By: [name] Corwin DO Entered By: [name] [name] [name] on 07/20/2023 15:21:23 -------------------------------------------------------------------------------- HPI Details Patient Name: Date of Service: [name], [name] [name] [name] 07/20/2023 1:15 PM Medical Record Number: [id number] Patient Account Number: [id number] Date of Birth/Sex: Treating RN: 08/19/1951 (72 y.o. M) Primary Care Provider: Fatima [name] Other Clinician: Referring Provider: Treating Provider/Extender: [name] [name] [name] in Treatment: 10 History of Present Illness HPI Description: CONSULT ONLY 04/28/2022 This is a 72 year old man with a past medical history notable for type 2 diabetes mellitus, stroke, and lower extremity edema. Apparently back in January, he did have a wound on his right lower extremity and was seen in the emergency department for this. I am not entirely sure how he ultimately was referred to the wound care center, but today he has no open wounds. He does have significant bilateral lower extremity edema, 2+ up to the knees. He does not wear compression stockings. He says that he applies Vaseline to his legs daily, but the skin is quite dry and scaly. ABIs in clinic today were 0.79 and 0.84. 05/05/2023 [name] [name] [name] is a 72 year old male with a past medical history of  cryptogenic right MCA stroke in 2021, chronic combined systolic and diastolic congestive heart failure, controlled type 2 diabetes on oral agents and lymphedema/chronic venous insufficiency that presents the clinic for a 1 week history of wound to his left lower extremity. He states it started as a blister and has ruptured creating the wound. He has been keeping the area covered. He does not wear compression stockings. He reports taking his Lasix as prescribed but has recently run out over the past day or 2. They are waiting on a refill at the pharmacy. Currently denies signs of infection. Denies shortness of breath or orthopnea. 5/21; patient presents for follow-up. We have been using Xeroform with antibiotic ointment under Kerlix/Coban to the left lower extremity. He has tolerated this well. He has no issues or complaints today. 5/30; patient presents for follow up. We have been using Xeroform and anitbiotic ointment under Kerlix/lower extremity. He took the wrap off due to increased itching to the periwound. Wounds are much smaller. No signs of infection. 6/7; we have been using Xeroform topical antibiotic under kerlix Coban. Unfortunately the patient simply will not leave the wrap in place. He came in with this rolled around his ankle. He says it is "itchy" his swelling is not well-controlled. 6/20; patient presents for follow-up. The dressing was changed at last clinic visit to Tubigrip and Hydrofera Blue to the left leg. This is much larger today. Unfortunately has developed a skin tear vs blister to the right lateral leg. 6/25; patient presents for follow-up. We have been using antibiotic ointment with Xeroform under Kerlix/Coban to the lower extremities bilaterally. Wounds are smaller. Unfortunately he keeps trying to take the wraps off. We are trying to get him home health but have not heard back yet. 7/1; patient presents for follow-up. We have been using antibiotic ointment with Xeroform  under Kerlix/Coban to the lower extremities bilaterally. Wounds are  smaller. [name], [name] ([id number]) [id number] Page 2 of 7 7/12; patient presents for follow-up. We have been using antibiotic ointment with Xeroform under Kerlix/Coban to the lower extremities bilaterally. The wounds have healed. He has compression stockings at home. 7/29; patient was discharged at last clinic visit due to closed wounds 2 weeks ago. He has not been wearing his compression stockings daily. He does not elevate his legs when sitting. He has developed a couple blisters to his left lower extremity. Nothing open. He is accompanied by his daughter today. Electronic Signature(s) Signed: 07/20/2023 5:19:21 PM By: [name] Corwin DO Entered By: [name] [name] [name] on 07/20/2023 15:21:57 -------------------------------------------------------------------------------- Physical Exam Details Patient Name: Date of Service: [name], [name] [name] 07/20/2023 1:15 PM Medical Record Number: [id number] Patient Account Number: [id number] Date of Birth/Sex: Treating RN: [birth year] (72 y.o. M) Primary Care Provider: Fatima [name] Other Clinician: Referring Provider: Treating Provider/Extender: [name] [name] [name] in Treatment: 10 Constitutional respirations regular, non-labored and within target range for patient.. Cardiovascular 2+ dorsalis pedis/posterior tibialis pulses. Psychiatric pleasant and cooperative. Notes Small intact blisters to the left lower extremity. 2+ pitting edema to the knees bilaterally. No open wounds to the legs. Electronic Signature(s) Signed: 07/20/2023 5:19:21 PM By: [name] Corwin DO Entered By: [name] [name] [name] on 07/20/2023 15:23:01 -------------------------------------------------------------------------------- Physician Orders Details Patient Name: Date of Service: [name], [name] [name] [name] 07/20/2023 1:15 PM Medical Record Number: [id number] Patient Account  Number: [id number] Date of Birth/Sex: Treating RN: [birth year] (72 y.o. [name] [name] [name] Primary Care Provider: Fatima [name] Other Clinician: Referring Provider: Treating Provider/Extender: [name] [name] [name] in Treatment: 10 Verbal / Phone Orders: No Diagnosis Coding Follow-up Appointments ppointment in 1 week. - Dr. Mikey Bussing Return A Bathing/ Shower/ Hygiene May shower and wash wound with soap and water. Edema Control - Lymphedema / SCD / Other Bilateral Lower Extremities Elevate legs to the level of the heart or above for 30 minutes daily and/or when sitting for 3-4 times a day throughout the day. Avoid standing for long periods of time. Patient to wear own compression stockings every day. - wear tubigrip sleeves daily, put on first thing in the morning, take off before bed [name], [name] [name] ([id number]) [phone].pdf Page 3 of 7 If compression wraps slide down please call wound center and speak with a nurse. Electronic Signature(s) Signed: 07/20/2023 5:19:21 PM By: [name] Corwin DO Entered By: [name] [name] [name] on 07/20/2023 15:23:09 -------------------------------------------------------------------------------- Problem List Details Patient Name: Date of Service: [name], SWIDER [name] [name] 07/20/2023 1:15 PM Medical Record Number: [id number] Patient Account Number: [id number] Date of Birth/Sex: Treating RN: 1951-05-10 (72 y.o. M) Primary Care Provider: Fatima [name] Other Clinician: Referring Provider: Treating Provider/Extender: [name] [name] [name] in Treatment: 10 Active Problems ICD-10 Encounter Code Description Active Date MDM Diagnosis S81.802A Unspecified open wound, left lower leg, initial encounter 05/05/2023 No Yes S81.801A Unspecified open wound, right lower leg, initial encounter 06/11/2023 No Yes I87.313 Chronic venous hypertension (idiopathic) with ulcer of bilateral lower extremity 06/11/2023 No Yes I89.0  Lymphedema, not elsewhere classified 05/05/2023 No Yes E11.622 Type 2 diabetes mellitus with other skin ulcer 05/05/2023 No Yes I50.42 Chronic combined systolic (congestive) and diastolic (congestive) heart failure 05/05/2023 No Yes Inactive Problems Resolved Problems Electronic Signature(s) Signed: 07/20/2023 5:19:21 PM By: [name] Corwin DO Entered By: [name] [name] [name] on 07/20/2023 15:20:53 -------------------------------------------------------------------------------- Progress Note Details Patient Name: Date of Service: [name], AKRIDGE [name] [name] 07/20/2023 1:15 PM Medical Record Number: [id number] Patient Account Number: [id number] Date of Birth/Sex: Treating RN: 02/19/1951 (72 y.o. Judie Petit Panetta, Jacqlyn Krauss ([id number]) [id number] Page 4  of 7 Primary Care Provider: Fatima [name] Other Clinician: Referring Provider: Treating Provider/Extender: [name] [name] [name] in Treatment: 10 Subjective Chief Complaint Information obtained from Patient 05/05/2023; left lower extremity wound History of Present Illness (HPI) CONSULT ONLY 04/28/2022 This is a 72 year old man with a past medical history notable for type 2 diabetes mellitus, stroke, and lower extremity edema. Apparently back in January, he did have a wound on his right lower extremity and was seen in the emergency department for this. I am not entirely sure how he ultimately was referred to the wound care center, but today he has no open wounds. He does have significant bilateral lower extremity edema, 2+ up to the knees. He does not wear compression stockings. He says that he applies Vaseline to his legs daily, but the skin is quite dry and scaly. ABIs in clinic today were 0.79 and 0.84. 05/05/2023 Mr. Lakshya Martinie is a 72 year old male with a past medical history of cryptogenic right MCA stroke in 2021, chronic combined systolic and diastolic congestive heart failure, controlled type 2 diabetes on oral  agents and lymphedema/chronic venous insufficiency that presents the clinic for a 1 week history of wound to his left lower extremity. He states it started as a blister and has ruptured creating the wound. He has been keeping the area covered. He does not wear compression stockings. He reports taking his Lasix as prescribed but has recently run out over the past day or 2. They are waiting on a refill at the pharmacy. Currently denies signs of infection. Denies shortness of breath or orthopnea. 5/21; patient presents for follow-up. We have been using Xeroform with antibiotic ointment under Kerlix/Coban to the left lower extremity. He has tolerated this well. He has no issues or complaints today. 5/30; patient presents for follow up. We have been using Xeroform and anitbiotic ointment under Kerlix/lower extremity. He took the wrap off due to increased itching to the periwound. Wounds are much smaller. No signs of infection. 6/7; we have been using Xeroform topical antibiotic under kerlix Coban. Unfortunately the patient simply will not leave the wrap in place. He came in with this rolled around his ankle. He says it is "itchy" his swelling is not well-controlled. 6/20; patient presents for follow-up. The dressing was changed at last clinic visit to Tubigrip and Hydrofera Blue to the left leg. This is much larger today. Unfortunately has developed a skin tear vs blister to the right lateral leg. 6/25; patient presents for follow-up. We have been using antibiotic ointment with Xeroform under Kerlix/Coban to the lower extremities bilaterally. Wounds are smaller. Unfortunately he keeps trying to take the wraps off. We are trying to get him home health but have not heard back yet. 7/1; patient presents for follow-up. We have been using antibiotic ointment with Xeroform under Kerlix/Coban to the lower extremities bilaterally. Wounds are smaller. 7/12; patient presents for follow-up. We have been using  antibiotic ointment with Xeroform under Kerlix/Coban to the lower extremities bilaterally. The wounds have healed. He has compression stockings at home. 7/29; patient was discharged at last clinic visit due to closed wounds 2 weeks ago. He has not been wearing his compression stockings daily. He does not elevate his legs when sitting. He has developed a couple blisters to his left lower extremity. Nothing open. He is accompanied by his daughter today. Patient History Information obtained from Patient, Chart. Family History Unknown History, Cancer - Father, Diabetes - Mother, Lung Disease - Mother. Social History Never smoker, Marital  Status - Single, Alcohol Use - Never, Drug Use - No History, Caffeine Use - Daily - coffee. Medical History Eyes Patient has history of Cataracts Denies history of Glaucoma, Optic Neuritis Cardiovascular Patient has history of Hypertension Endocrine Patient has history of Type II Diabetes Integumentary (Skin) Denies history of History of Burn Neurologic Patient has history of Seizure Disorder - CVA Denies history of Dementia, Neuropathy, Quadriplegia, Paraplegia Hospitalization/Surgery History - Loop Recorder insertion 01/23/2020. Medical A Surgical History Notes nd Eyes Diabetic Retinopathy Hypertensive Retinopathy Left Homonymous Hemianopsia Cardiovascular Acute Cerebrovascular Accident Neurologic Memory Change [name] [name], [name] ([id number]) [id number] Page 5 of 7 Objective Constitutional respirations regular, non-labored and within target range for patient.. Vitals Time Taken: 1:32 PM, Height: 67 in, Weight: 193 lbs, BMI: 30.2, Temperature: 98.8 F, Pulse: 64 bpm, Respiratory Rate: 18 breaths/min, Blood Pressure: 155/68 mmHg. Cardiovascular 2+ dorsalis pedis/posterior tibialis pulses. Psychiatric pleasant and cooperative. General Notes: Small intact blisters to the left lower extremity. 2+ pitting edema to the knees  bilaterally. No open wounds to the legs. Assessment Active Problems ICD-10 Unspecified open wound, left lower leg, initial encounter Unspecified open wound, right lower leg, initial encounter Chronic venous hypertension (idiopathic) with ulcer of bilateral lower extremity Lymphedema, not elsewhere classified Type 2 diabetes mellitus with other skin ulcer Chronic combined systolic (congestive) and diastolic (congestive) heart failure Patient presents with no open wounds however he does have blistering to the left leg. He has not been wearing his compression stockings. He has been treated previously in our clinic for open wounds to lower extremities with in office wraps however more often than not he takes them off during the week. Today I offered in office wraps to help with his edema control versus using compression stockings. Discussed also with daughter. Since patient does take off the wraps and could potentially create more wounds if not taken off all the way as this has happened patient has decided to elevate legs and use his compression stockings. I recommended follow-up in 1 week to assure that the blisters did not become open wounds. Plan Follow-up Appointments: Return Appointment in 1 week. - Dr. Mikey Bussing Bathing/ Shower/ Hygiene: May shower and wash wound with soap and water. Edema Control - Lymphedema / SCD / Other: Elevate legs to the level of the heart or above for 30 minutes daily and/or when sitting for 3-4 times a day throughout the day. Avoid standing for long periods of time. Patient to wear own compression stockings every day. - wear tubigrip sleeves daily, put on first thing in the morning, take off before bed If compression wraps slide down please call wound center and speak with a nurse. 1. Compression stockings dailygave him Tubigrip in office 2. Elevate legs 3. Follow-up in 1 week Electronic Signature(s) Signed: 07/20/2023 5:19:21 PM By: [name] Corwin DO Entered  By: [name] [name] [name] on 07/20/2023 15:27:37 -------------------------------------------------------------------------------- HxROS Details Patient Name: Date of Service: [name], TRITTEN [name] [name] 07/20/2023 1:15 PM Medical Record Number: [id number] Patient Account Number: [id number] [name], [name] [name] ([id number]) [id number] Page 6 of 7 Date of Birth/Sex: Treating RN: 14-Sep-1951 (72 y.o. M) Primary Care Provider: Fatima [name] Other Clinician: Referring Provider: Treating Provider/Extender: [name] [name] [name] in Treatment: 10 Information Obtained From Patient Chart Eyes Medical History: Positive for: Cataracts Negative for: Glaucoma; Optic Neuritis Past Medical History Notes: Diabetic Retinopathy Hypertensive Retinopathy Left Homonymous Hemianopsia Cardiovascular Medical History: Positive for: Hypertension Past Medical History Notes: Acute Cerebrovascular Accident Endocrine Medical History: Positive for: Type II Diabetes Treated with: Oral agents Blood sugar  tested every day: Yes Tested : every day Integumentary (Skin) Medical History: Negative for: History of Burn Neurologic Medical History: Positive for: Seizure Disorder - CVA Negative for: Dementia; Neuropathy; Quadriplegia; Paraplegia Past Medical History Notes: Memory Change HBO Extended History Items Eyes: Cataracts Immunizations Pneumococcal Vaccine: Received Pneumococcal Vaccination: No Implantable Devices None Hospitalization / Surgery History Type of Hospitalization/Surgery Loop Recorder insertion 01/23/2020 Family and Social History Unknown History: Yes; Cancer: Yes - Father; Diabetes: Yes - Mother; Lung Disease: Yes - Mother; Never smoker; Marital Status - Single; Alcohol Use: Never; Drug Use: No History; Caffeine Use: Daily - coffee; Financial Concerns: No; Food, Clothing or Shelter Needs: No; Support System Lacking: No; Transportation Concerns: No Electronic  Signature(s) Signed: 07/20/2023 5:19:21 PM By: [name] Corwin DO Entered By: [name] [name] [name] on 07/20/2023 15:22:03 [name], [name] [name] ([id number]) [id number] Page 7 of 7 -------------------------------------------------------------------------------- SuperBill Details Patient Name: Date of Service: QUINNTEN, BITER 07/20/2023 Medical Record Number: [id number] Patient Account Number: [id number] Date of Birth/Sex: Treating RN: [birth year] (72 y.o. [name] [name] [name] Primary Care Provider: Fatima [name] Other Clinician: Referring Provider: Treating Provider/Extender: [name] [name] [name] in Treatment: 10 Diagnosis Coding ICD-10 Codes Code Description [phone] Unspecified open wound, left lower leg, initial encounter S81.801A Unspecified open wound, right lower leg, initial encounter I87.313 Chronic venous hypertension (idiopathic) with ulcer of bilateral lower extremity I89.0 Lymphedema, not elsewhere classified E11.622 Type 2 diabetes mellitus with other skin ulcer I50.42 Chronic combined systolic (congestive) and diastolic (congestive) heart failure Facility Procedures : CPT4 Code: [id number] Description: 25956 - WOUND CARE VISIT-LEV 2 EST PT Modifier: Quantity: 1 Physician Procedures : CPT4 Code Description Modifier [id number] - WC PHYS LEVEL 3 - EST PT ICD-10 Diagnosis Description I89.0 Lymphedema, not elsewhere classified Quantity: 1 Electronic Signature(s) Signed: 07/20/2023 5:19:21 PM By: [name] Corwin DO Entered By: [name] [name] [name] on 07/20/2023 15:29:03

## 2023-07-21 NOTE — Progress Notes (Signed)
Hobin, Jousha ([id number]) [id number] Page 1 of 7 Visit Report for 07/20/2023 Arrival Information Details Patient Name: Date of Service: [name], [name] 07/20/2023 1:15 PM Medical Record Number: [id number] Patient Account Number: [id number] Date of Birth/Sex: Treating RN: 1951/04/11 (72 y.o. Cline Cools Primary Care Joy Haegele: [name] Other Clinician: Referring Kamira Mellette: Treating Shaquitta Burbridge/Extender: Ebony Cargo in Treatment: 10 Visit Information History Since Last Visit Added or deleted any medications: No Patient Arrived: Ambulatory Any new allergies or adverse reactions: No Arrival Time: 13:32 Had a fall or experienced change in No Accompanied By: daughter activities of daily living that may affect Transfer Assistance: None risk of falls: Patient Identification Verified: Yes Signs or symptoms of abuse/neglect since last visito No Secondary Verification Process Completed: Yes Hospitalized since last visit: No Patient Requires Transmission-Based Precautions: No Implantable device outside of the clinic excluding No Patient Has Alerts: No cellular tissue based products placed in the center since last visit: Pain Present Now: No Electronic Signature(s) Signed: 07/20/2023 5:33:27 PM By: Redmond Pulling RN, BSN Entered By: Redmond Pulling on 07/20/2023 14:29:06 -------------------------------------------------------------------------------- Clinic Level of Care Assessment Details Patient Name: Date of Service: [name], [name] [name] 07/20/2023 1:15 PM Medical Record Number: [id number] Patient Account Number: [id number] Date of Birth/Sex: Treating RN: 05-Jan-1951 (72 y.o. Cline Cools Primary Care Baltazar Pekala: [name] Other Clinician: Referring Lorain Fettes: Treating Ajahnae Rathgeber/Extender: Ebony Cargo in Treatment: 10 Clinic Level of Care Assessment Items TOOL 4 Quantity Score X- 1 0 Use when only an  EandM is performed on FOLLOW-UP visit ASSESSMENTS - Nursing Assessment / Reassessment X- 1 10 Reassessment of Co-morbidities (includes updates in patient status) X- 1 5 Reassessment of Adherence to Treatment Plan ASSESSMENTS - Wound and Skin A ssessment / Reassessment []  - 0 Simple Wound Assessment / Reassessment - one wound []  - 0 Complex Wound Assessment / Reassessment - multiple wounds []  - 0 Dermatologic / Skin Assessment (not related to wound area) ASSESSMENTS - Focused Assessment X- 2 5 Circumferential Edema Measurements - multi extremities []  - 0 Nutritional Assessment / Counseling / Intervention []  - 0 Lower Extremity Assessment (monofilament, tuning fork, pulses) [name], [name] ([id number]) [id number] Page 2 of 7 []  - 0 Peripheral Arterial Disease Assessment (using hand held doppler) ASSESSMENTS - Ostomy and/or Continence Assessment and Care []  - 0 Incontinence Assessment and Management []  - 0 Ostomy Care Assessment and Management (repouching, etc.) PROCESS - Coordination of Care X - Simple Patient / Family Education for ongoing care 1 15 []  - 0 Complex (extensive) Patient / Family Education for ongoing care X- 1 10 Staff obtains Chiropractor, Records, T Results / Process Orders est []  - 0 Staff telephones HHA, Nursing Homes / Clarify orders / etc []  - 0 Routine Transfer to another Facility (non-emergent condition) []  - 0 Routine Hospital Admission (non-emergent condition) []  - 0 New Admissions / Manufacturing engineer / Ordering NPWT Apligraf, etc. , []  - 0 Emergency Hospital Admission (emergent condition) []  - 0 Simple Discharge Coordination []  - 0 Complex (extensive) Discharge Coordination PROCESS - Special Needs []  - 0 Pediatric / Minor Patient Management []  - 0 Isolation Patient Management []  - 0 Hearing / Language / Visual special needs []  - 0 Assessment of Community assistance (transportation, D/C planning, etc.) []   - 0 Additional assistance / Altered mentation []  - 0 Support Surface(s) Assessment (bed, cushion, seat, etc.) INTERVENTIONS - Wound Cleansing / Measurement []  - 0 Simple Wound Cleansing - one wound []  - 0 Complex Wound Cleansing - multiple  wounds []  - 0 Wound Imaging (photographs - any number of wounds) []  - 0 Wound Tracing (instead of photographs) []  - 0 Simple Wound Measurement - one wound []  - 0 Complex Wound Measurement - multiple wounds INTERVENTIONS - Wound Dressings X - Small Wound Dressing one or multiple wounds 1 10 []  - 0 Medium Wound Dressing one or multiple wounds []  - 0 Large Wound Dressing one or multiple wounds []  - 0 Application of Medications - topical []  - 0 Application of Medications - injection INTERVENTIONS - Miscellaneous []  - 0 External ear exam []  - 0 Specimen Collection (cultures, biopsies, blood, body fluids, etc.) []  - 0 Specimen(s) / Culture(s) sent or taken to Lab for analysis []  - 0 Patient Transfer (multiple staff / Nurse, adult / Similar devices) []  - 0 Simple Staple / Suture removal (25 or less) []  - 0 Complex Staple / Suture removal (26 or more) []  - 0 Hypo / Hyperglycemic Management (close monitor of Blood Glucose) []  - 0 Ankle / Brachial Index (ABI) - do not check if billed separately [name], [name] ([id number]) [id number] Page 3 of 7 X- 1 5 Vital Signs Has the patient been seen at the hospital within the last three years: Yes Total Score: 65 Level Of Care: New/Established - Level 2 Electronic Signature(s) Signed: 07/20/2023 5:33:27 PM By: Redmond Pulling RN, BSN Entered By: Redmond Pulling on 07/20/2023 14:27:48 -------------------------------------------------------------------------------- Encounter Discharge Information Details Patient Name: Date of Service: [name], [name] 07/20/2023 1:15 PM Medical Record Number: [id number] Patient Account Number: [id number] Date of Birth/Sex: Treating  RN: 1951/02/14 (72 y.o. Cline Cools Primary Care Leeland Lovelady: [name] Other Clinician: Referring Gracen Ringwald: Treating Arch Methot/Extender: Ebony Cargo in Treatment: 10 Encounter Discharge Information Items Discharge Condition: Stable Ambulatory Status: Ambulatory Discharge Destination: Home Telephoned: Yes Orders Sent: Yes Transportation: Private Auto Accompanied By: daughter Schedule Follow-up Appointment: Yes Clinical Summary of Care: Patient Declined Electronic Signature(s) Signed: 07/20/2023 5:33:27 PM By: Redmond Pulling RN, BSN Entered By: Redmond Pulling on 07/20/2023 14:28:35 -------------------------------------------------------------------------------- Lower Extremity Assessment Details Patient Name: Date of Service: [name], [name] 07/20/2023 1:15 PM Medical Record Number: [id number] Patient Account Number: [id number] Date of Birth/Sex: Treating RN: [birth year] (72 y.o. Cline Cools Primary Care Sedona Wenk: [name] Other Clinician: Referring Kallin Henk: Treating Jailine Lieder/Extender: Ebony Cargo in Treatment: 10 Edema Assessment Assessed: Kyra Searles: No] [Right: No] Edema: [Left: Yes] [Right: Yes] Calf Left: Right: Point of Measurement: From Medial Instep 38 cm 37 cm Ankle Left: Right: Point of Measurement: From Medial Instep 24 cm 22.5 cm Vascular Assessment Left: [[id number] Page 4 of 7Right:] Pulses: Dorsalis Pedis Palpable: [[id number] Page 4 of 7Yes Yes] Extremity colors, hair growth, and conditions: Extremity Color: [phone].pdf Page 4 of 7Hyperpigmented Hyperpigmented] Hair Growth on Extremity: [[id number] Page 4 of 7No No] Temperature of Extremity: [phone].pdf Page 4 of 7Warm Warm] Capillary Refill: [phone].pdf Page 4 of 7< 3 seconds < 3  seconds] Dependent Rubor: [[id number] Page 4 of 7No No No No] Toe Nail Assessment Left: Right: Thick: Yes Yes Discolored: Yes Yes Deformed: Yes Yes Improper Length and Hygiene: No No Electronic Signature(s) Signed: 07/20/2023 5:33:27 PM By: Redmond Pulling RN, BSN Entered By: Redmond Pulling on 07/20/2023 13:39:06 -------------------------------------------------------------------------------- Multi Wound Chart Details Patient Name: Date of Service: [name], [name] 07/20/2023 1:15 PM Medical Record Number: [id number] Patient Account Number: [id number] Date of Birth/Sex: Treating RN: 05-17-1951 (72 y.o. M) Primary Care Dominion Kathan: [name] Other Clinician: Referring Khasir Woodrome: Treating Nayson Traweek/Extender: Ebony Cargo in  Treatment: 10 Vital Signs Height(in): 67 Pulse(bpm): 64 Weight(lbs): 193 Blood Pressure(mmHg): 155/68 Body Mass Index(BMI): 30.2 Temperature(F): 98.8 Respiratory Rate(breaths/min): 18 [Treatment Notes:Wound Assessments Treatment Notes] Electronic Signature(s) Signed: 07/20/2023 5:19:21 PM By: Geralyn Corwin DO Entered By: Geralyn Corwin on 07/20/2023 15:21:06 -------------------------------------------------------------------------------- Multi-Disciplinary Care Plan Details Patient Name: Date of Service: [name], [name] 07/20/2023 1:15 PM Medical Record Number: [id number] Patient Account Number: [id number] Date of Birth/Sex: Treating RN: [birth year] (72 y.o. Cline Cools Primary Care Encarnacion Scioneaux: [name] Other Clinician: Referring Jaylani Mcguinn: Treating Kartel Wolbert/Extender: Ebony Cargo in Treatment: [address], [place] ([id number]) [id number] Page 5 of 7 Active Inactive Venous Leg Ulcer Nursing Diagnoses: Knowledge deficit related to disease process and management Goals: Patient will maintain optimal edema control Date Initiated: 07/20/2023 Target  Resolution Date: 08/19/2023 Goal Status: Active Patient/caregiver will verbalize understanding of disease process and disease management Date Initiated: 07/20/2023 Target Resolution Date: 08/19/2023 Goal Status: Active Interventions: Assess peripheral edema status every visit. Compression as ordered Provide education on venous insufficiency Notes: Electronic Signature(s) Signed: 07/20/2023 5:33:27 PM By: Redmond Pulling RN, BSN Entered By: Redmond Pulling on 07/20/2023 13:48:32 -------------------------------------------------------------------------------- Non-Wound Condition Assessment Details Patient Name: Date of Service: [name], [name] 07/20/2023 1:15 PM Medical Record Number: [id number] Patient Account Number: [id number] Date of Birth/Sex: Treating RN: 1951-06-13 (72 y.o. Cline Cools Primary Care Carolee Channell: [name] Other Clinician: Referring Thelton Graca: Treating Shishir Krantz/Extender: Ebony Cargo in Treatment: 10 Non-Wound Condition: Condition: Lymphedema Location: Leg Side: Bilateral Periwound Skin Texture Texture Color No Abnormalities Noted: No No Abnormalities Noted: No Hemosiderin Staining: Yes Moisture No Abnormalities Noted: No Temperature / Pain Temperature: No Abnormality Notes bilateral edema, blisters on bilateral lower legs Electronic Signature(s) Signed: 07/20/2023 5:33:27 PM By: Redmond Pulling RN, BSN Entered By: Redmond Pulling on 07/20/2023 13:47:17 -------------------------------------------------------------------------------- Pain Assessment Details Patient Name: Date of Service: [name] [name] 07/20/2023 1:15 PM Vonderhaar, [name] ([id number]) [id number] Page 6 of 7 Medical Record Number: [id number] Patient Account Number: [id number] Date of Birth/Sex: Treating RN: June 10, 1951 (72 y.o. Cline Cools Primary Care Rayette Mogg: [name] Other Clinician: Referring Trine Fread: Treating  Ashtyn Freilich/Extender: Ebony Cargo in Treatment: 10 Active Problems Location of Pain Severity and Description of Pain Patient Has Paino No Site Locations Pain Management and Medication Current Pain Management: Electronic Signature(s) Signed: 07/20/2023 5:33:27 PM By: Redmond Pulling RN, BSN Entered By: Redmond Pulling on 07/20/2023 13:35:09 -------------------------------------------------------------------------------- Patient/Caregiver Education Details Patient Name: Date of Service: [name], [name] 7/29/2024andnbsp1:15 PM Medical Record Number: [id number] Patient Account Number: [id number] Date of Birth/Gender: Treating RN: 1951/10/23 (72 y.o. Cline Cools Primary Care Physician: [name] Other Clinician: Referring Physician: Treating Physician/Extender: Ebony Cargo in Treatment: 10 Education Assessment Education Provided To: Patient Education Topics Provided Wound/Skin Impairment: Methods: Explain/Verbal Responses: State content correctly [employer]) Signed: 07/20/2023 5:33:27 PM By: Redmond Pulling RN, BSN Entered By: Redmond Pulling on 07/20/2023 13:48:47 [name], [name] ([id number]) [id number] Page 7 of 7 -------------------------------------------------------------------------------- Vitals Details Patient Name: Date of Service: [name], [name] 07/20/2023 1:15 PM Medical Record Number: [id number] Patient Account Number: [id number] Date of Birth/Sex: Treating RN: September 07, 1951 (72 y.o. Cline Cools Primary Care Leticia Coletta: [name] Other Clinician: Referring Bejamin Hackbart: Treating Jasiyah Paulding/Extender: Ebony Cargo in Treatment: 10 Vital Signs Time Taken: 13:32 Temperature (F): 98.8 Height (in): 67 Pulse (bpm): 64 Weight (lbs): 193 Respiratory Rate (breaths/min): 18 Body Mass Index (BMI): 30.2 Blood Pressure (mmHg): 155/68 Reference Range: 80 - 120 mg  / dl Electronic Signature(s) Signed:  07/20/2023 5:33:27 PM By: Redmond Pulling RN, BSN Entered By: Redmond Pulling on 07/20/2023 13:35:03

## 2023-07-27 ENCOUNTER — Encounter (HOSPITAL_BASED_OUTPATIENT_CLINIC_OR_DEPARTMENT_OTHER): Payer: Medicare HMO | Attending: Internal Medicine | Admitting: Internal Medicine

## 2023-07-27 DIAGNOSIS — I110 Hypertensive heart disease with heart failure: Secondary | ICD-10-CM | POA: Insufficient documentation

## 2023-07-27 DIAGNOSIS — Z833 Family history of diabetes mellitus: Secondary | ICD-10-CM | POA: Insufficient documentation

## 2023-07-27 DIAGNOSIS — G40909 Epilepsy, unspecified, not intractable, without status epilepticus: Secondary | ICD-10-CM | POA: Insufficient documentation

## 2023-07-27 DIAGNOSIS — I5042 Chronic combined systolic (congestive) and diastolic (congestive) heart failure: Secondary | ICD-10-CM | POA: Insufficient documentation

## 2023-07-27 DIAGNOSIS — E11622 Type 2 diabetes mellitus with other skin ulcer: Secondary | ICD-10-CM | POA: Insufficient documentation

## 2023-07-27 DIAGNOSIS — I872 Venous insufficiency (chronic) (peripheral): Secondary | ICD-10-CM | POA: Insufficient documentation

## 2023-07-27 DIAGNOSIS — I890 Lymphedema, not elsewhere classified: Secondary | ICD-10-CM | POA: Insufficient documentation

## 2023-08-05 NOTE — Progress Notes (Signed)
[facility] Summary Report / Loop Recorder 

## 2023-08-07 NOTE — Progress Notes (Shared)
[facility] - Clinic Note  08/10/2023     CHIEF COMPLAINT Patient presents for Retina Follow Up   HISTORY OF PRESENT ILLNESS: [name] is a 72 y.o. male who presents to the clinic today for:   HPI     Retina Follow Up   Patient presents with  Diabetic Retinopathy.  In both eyes.  This started 5 weeks ago.  Duration of 5 weeks.  Since onset it is stable.  I, the attending physician,  performed the HPI with the patient and updated documentation appropriately.        Comments   5 week retina follow up NPDR pt is reporting no vision changes noticed he denis any flashes or floaters pt has been having watering out of his left eye for the past 2 days some redness and itching he is using pred bid ou latanoprost ou bid and refresh bid ou last reading was a few days ago unknown       Last edited by Rennis Chris, MD on 08/10/2023 11:25 AM.    Patient states that he has a bump on his tongue. He is complaining of his right eye is watering and itching.   Referring physician: Raymon Mutton., FNP [address] [place],  [place] [address]  HISTORICAL INFORMATION:   Selected notes from the MEDICAL RECORD NUMBER Referred by Dr. Fabian Sharp for concern of HTN Ret   CURRENT MEDICATIONS: Current Outpatient Medications (Ophthalmic Drugs)  Medication Sig   latanoprost (XALATAN) 0.005 % ophthalmic solution SMARTSIG:In Eye(s)   Polyvinyl Alcohol-Povidone (REFRESH OP) Place 1 drop into both eyes in the morning and at bedtime.   No current facility-administered medications for this visit. (Ophthalmic Drugs)   Current Outpatient Medications (Other)  Medication Sig   valACYclovir (VALTREX) 1000 MG tablet Take 2 tablets (2,000 mg total) by mouth daily for 14 days. Take 1 pill in the morning and 1 pill in the evening   aspirin 325 MG EC tablet aspirin 325 mg tablet,delayed release   atorvastatin (LIPITOR) 40 MG tablet Take 1 tablet (40 mg total) by mouth daily at 6 PM.    bacitracin ointment Apply 1 Application topically 2 (two) times daily.   carvedilol (COREG) 6.25 MG tablet Take 6.25 mg by mouth 2 (two) times daily with a meal.   donepezil (ARICEPT) 10 MG tablet TAKE 1 TABLET AT BEDTIME   furosemide (LASIX) 40 MG tablet Take 1 tablet (40 mg total) by mouth 2 (two) times daily for 4 days, THEN 1 tablet (40 mg total) daily.   hydrochlorothiazide (HYDRODIURIL) 25 MG tablet Take 25 mg by mouth daily.   losartan (COZAAR) 100 MG tablet Take 100 mg by mouth daily.   memantine (NAMENDA) 10 MG tablet TAKE 1 TABLET TWICE DAILY   metFORMIN (GLUCOPHAGE) 500 MG tablet Take by mouth 2 (two) times daily with a meal.   Multiple Vitamins-Minerals (ONE-A-DAY MENS 50+ PO) Take 1 tablet by mouth daily.   mupirocin ointment (BACTROBAN) 2 % as needed.   PFIZER-BIONT COVID-19 VAC-TRIS SUSP injection    PFIZER-BIONTECH COVID-19 VACC 30 MCG/0.3ML injection    Potassium Chloride ER 20 MEQ TBCR Take 1 tablet (20 mEq total) by mouth in the morning and at bedtime for 4 days, THEN 1 tablet (20 mEq total) daily.   traZODone (DESYREL) 100 MG tablet Take 100 mg by mouth at bedtime.   Current Facility-Administered Medications (Other)  Medication Route   0.9 %  sodium chloride infusion Intravenous  0.9 %  sodium chloride infusion Intravenous   REVIEW OF SYSTEMS: ROS   Positive for: Endocrine, Eyes Negative for: Constitutional, Gastrointestinal, Neurological, Skin, Genitourinary, Musculoskeletal, HENT, Cardiovascular, Respiratory, Psychiatric, Allergic/Imm, Heme/Lymph Last edited by Etheleen Mayhew, COT on 08/10/2023  9:25 AM.     ALLERGIES Allergies  Allergen Reactions   Shellfish Allergy Anaphylaxis   PAST MEDICAL HISTORY Past Medical History:  Diagnosis Date   Acute cerebrovascular accident (CVA) (HCC) 01/20/2020   Benign essential HTN 01/20/2020   Cataract    Mixed form OU   Dementia (HCC)    Diabetic retinopathy (HCC)    NPDR OU   Hypertensive retinopathy     OU   Seizures (HCC)    per pt- "none since teenage years"   Past Surgical History:  Procedure Laterality Date   LOOP RECORDER INSERTION N/A 01/23/2020   Procedure: LOOP RECORDER INSERTION;  Surgeon: Duke Salvia, MD;  Location: [facility] INVASIVE CV LAB;  Service: Cardiovascular;  Laterality: N/A;   FAMILY HISTORY Family History  Problem Relation Age of Onset   COPD Mother    Diabetes Mother    Dementia Father    Diabetes Father    Cancer Father        prostate and lung   Colon cancer Neg Hx    Esophageal cancer Neg Hx    Stomach cancer Neg Hx    Rectal cancer Neg Hx    SOCIAL HISTORY Social History   Tobacco Use   Smoking status: Never   Smokeless tobacco: Never  Vaping Use   Vaping status: Never Used  Substance Use Topics   Alcohol use: Never   Drug use: Never       OPHTHALMIC EXAM: Base Eye Exam     Visual Acuity (Snellen - Linear)       Right Left   Dist [place] 20/150 -1 20/30 -2   Dist ph [place] NI NI  OD watering a lot         Tonometry (Tonopen, 9:32 AM)       Right Left   Pressure 12 15         Pupils       Pupils Dark Light Shape React APD   Right PERRL 3 2 Round Brisk None   Left PERRL 3 2 Round Brisk None         Visual Fields       Left Right    Full Full         Extraocular Movement       Right Left    Full, Ortho Full, Ortho         Neuro/Psych     Oriented x3: Yes   Mood/Affect: Normal         Dilation     Both eyes: 2.5% Phenylephrine @ 9:32 AM           Slit Lamp and Fundus Exam     Slit Lamp Exam       Right Left   Lids/Lashes Dermatochalasis - upper lid, mild MGD, Ptosis Dermatochalasis - upper lid   Conjunctiva/Sclera Nasal and temporal Pinguecula, , Melanosis, 2+ Injection Nasal and temporal Pinguecula, mild Melanosis   Cornea Arcus, 3+ Punctate epithelial erosions, well healed cataract wound, Dendrite like pattern- no true dendrite Arcus, treace Punctate epithelial erosions, mild debris in tear film    Anterior Chamber Deep, 2+cell and fine pigment Deep and clear   Iris Round and dilated, No NVI Round and dilated,  No NVI   Lens PC IOL in good position PC IOL in good postition   Anterior Vitreous Vitreous syneresis Vitreous syneresis, Posterior vitreous detachment         Fundus Exam       Right Left   Disc sharp rim, 2-3+pallor, +cupping Sharp, mild Pallor   C/D Ratio 0.6 0.5   Macula Flat, good foveal reflex, scattered MA / DBH greatest superior mac Flat, good foveal reflex, scattered Microaneurysms/DBH, cystic changes IT macula -- slightly increased, focal blot heme IN fovea   Vessels attenuated, mild tortuosity, focal flame heme along ST arcades attenuated, Tortuous, Telangiectasia   Periphery Attached, operculated hole at 1000 with partial pigment and +cuff of SRF -- good laser changes surrounding, scattered MA/DBH greatest posteriorly, White without pressure temporal periphery, good peripheral PRP changes 360, scattered DBH greatest superiorly Attached, scattered DBH, focal pigmented CR scar at 0130 equator, good peripheral 360 PRP           IMAGING AND PROCEDURES  Imaging and Procedures for @TODAY @  OCT, Retina - OU - Both Eyes       Right Eye Quality was borderline. Central Foveal Thickness: 244. Progression has worsened. Findings include normal foveal contour, no IRF, no SRF (mild diffuse retinal thinning -- stable, focal cystic changes IT mac).   Left Eye Quality was borderline. Central Foveal Thickness: 438. Progression has worsened. Findings include normal foveal contour, no SRF, intraretinal hyper-reflective material, intraretinal fluid (Interval increase in IRF / edema IT fovea and mac ).   Notes *Images captured and stored on drive  Diagnosis / Impression:  OD: NFP, no IRF / SRF -- mild diffuse retinal thinning -- stable; focal cystic changes IT mac OS: NFP, no SRF, Interval increase in IRF / edema IT fovea and mac    Clinical management:  See  below  Abbreviations: NFP - Normal foveal profile. CME - cystoid macular edema. PED - pigment epithelial detachment. IRF - intraretinal fluid. SRF - subretinal fluid. EZ - ellipsoid zone. ERM - epiretinal membrane. ORA - outer retinal atrophy. ORT - outer retinal tubulation. SRHM - subretinal hyper-reflective material      Intravitreal Injection, Pharmacologic Agent - OS - Left Eye       Time Out 08/10/2023. 10:17 AM. Confirmed correct patient, procedure, site, and patient consented.   Anesthesia Topical anesthesia was used. Anesthetic medications included Lidocaine 2%, Proparacaine 0.5%.   Procedure Preparation included 5% betadine to ocular surface, eyelid speculum. A (32g) needle was used.   Injection: 2 mg aflibercept 2 MG/0.05ML   Route: Intravitreal, Site: Left Eye   NDC: L6038910, Lot: [id number], Expiration date: 09/20/2024, Waste: 0 mL   Post-op Post injection exam found visual acuity of at least counting fingers. The patient tolerated the procedure well. There were no complications. The patient received written and verbal post procedure care education.            ASSESSMENT/PLAN:    ICD-10-CM   1. Moderate nonproliferative diabetic retinopathy of both eyes with macular edema associated with type 2 diabetes mellitus (HCC)  E11.3313 OCT, Retina - OU - Both Eyes    Intravitreal Injection, Pharmacologic Agent - OS - Left Eye    aflibercept (EYLEA) SOLN 2 mg    2. Diabetes mellitus treated with oral medication (HCC)  E11.9    Z79.84     3. Retinal hole of right eye  H33.321     4. Essential hypertension  I10     5. Hypertensive retinopathy of  both eyes  H35.033     6. Pseudophakia  Z96.1     7. Dry eyes  H04.123     8. Herpes keratitis of right eye  B00.52      1,2. Moderate non-proliferative diabetic retinopathy, OU - s/p IVA OS #1 (05.07.21), #2 (06.04.21), #3 (07.06.21), #4 (08.03.21), #5 (08.31.21), #6 (9.28.21), #7 (11.2.21), #8 (12.07.21), #9  (02.02.22), #10 (03.30.22), #11 (05.25.22), #12 (08.03.22), #13 (10.20.22), #14 (01.12.23), #15 (01.22.24), #16 (03.11.24), #17 (03.11.24), #18 (04.22.24), #19 (06.03.24), #20 (07.15.24)  - s/p PRP OS (04.09.21) -- good laser changes - s/p PRP OD (02.05.24) for peripheral vascular nonperfusion -- good early laser changes - repeat FA (09.28.21) shows late leaking MA OU, significant capillary drop-out  - repeat FA (01.23.24) shows OD: Scattered patches of vascular non-perfusion; large area of vascular non perfusion temporally, no NV; OS: delayed filling; scattered patches of vascular non perfusion peripherally, staining of 360 laser, scattered leaking MA -- most prominent IT to fovea, no NV  - exam shows scattered MA/IRH/CWS OU -- improving   - BCVA OD 20/150, 20/30 OS - decreased OU - OCT shows OD: NFP, no IRF / SRF -- mild diffuse retinal thinning -- stable; focal cystic changes IT mac; OS: NFP, no SRF, Interval increase in IRF / edema IT fovea and mac at 5 weeks **discussed decreased efficacy / resistance to Avastin and potential benefit of switching medication**  - recommend IVE OS #1 today, 08.19.24 w/ f/u back to 4 wks  - pt in agreement  - RBA of procedure discussed, questions answered - IVA informed consent obtained and re-signed OS (01.22.24) - IVE informed consent obtained and signed OS (08.19.24) - see procedure note  - f/u 4 wks -- DFE/OCT, possible injction  3. Operculated retinal hole w/ cuff of SRF / focal RD, right eye - operculated hole located at 1000 with partial pigment and +cuff of SRF / focal RD  - s/p retinopexy OD (03.17.21) -- good laser changes surrounding  - stable monitor  4,5. Hypertensive retinopathy OU  - discussed importance of tight BP control  - monitor  6. Pseudophakia OD  - s/p CE/IOL (Dr. Laruth Bouchard, 07.02.24)  - IOL in good position, doing well  - monitor  7. Dry eyes OU  - recommend artificial tears and lubricating ointment as needed  8 Herpes  Keratitis, OD  - exam today with 1-2+ conj injxn; 3+ PEE w/ ?dendritic like pattern; +AC cell  - recommend Valtrex 1mg  PO BID for 14 days, rx was sent to pharmacy on file - patient was instructed to use artifical tears at least QID and to put the tears in the refrigerator to help sooth the eye. - f/u 2 weeks   Ophthalmic Meds Ordered this visit:  Meds ordered this encounter  Medications   valACYclovir (VALTREX) 1000 MG tablet    Sig: Take 2 tablets (2,000 mg total) by mouth daily for 14 days. Take 1 pill in the morning and 1 pill in the evening    Dispense:  28 tablet    Refill:  0   aflibercept (EYLEA) SOLN 2 mg     Return in about 2 weeks (around 08/24/2023) for 2 weeks for f/u OD (herpes keratitis); 4 wks for NPDR OU, DFE, OCT, Possible Injxn.  There are no Patient Instructions on file for this visit.  Explained the diagnoses, plan, and follow up with the patient and they expressed understanding.  Patient expressed understanding of the importance of proper follow up  care.   This document serves as a record of services personally performed by Karie Chimera, MD, PhD. It was created on their behalf by Annalee Genta, COMT. The creation of this record is the provider's dictation and/or activities during the visit.  Electronically signed by: Annalee Genta, COMT 08/10/23 11:36 AM  This document serves as a record of services personally performed by Karie Chimera, MD, PhD. It was created on their behalf by Laurey Morale, COT an ophthalmic technician. The creation of this record is the provider's dictation and/or activities during the visit.    Electronically signed by:  Charlette Caffey, COT  08/10/23 11:36 AM  Karie Chimera, M.D., Ph.D. Diseases & Surgery of the Retina and Vitreous [facility]  I have reviewed the above documentation for accuracy and completeness, and I agree with the above. Karie Chimera, M.D., Ph.D. 08/10/23 11:36  AM   Abbreviations: M myopia (nearsighted); A astigmatism; H hyperopia (farsighted); P presbyopia; Mrx spectacle prescription;  CTL contact lenses; OD right eye; OS left eye; OU both eyes  XT exotropia; ET esotropia; PEK punctate epithelial keratitis; PEE punctate epithelial erosions; DES dry eye syndrome; MGD meibomian gland dysfunction; ATs artificial tears; PFAT's preservative free artificial tears; NSC nuclear sclerotic cataract; PSC posterior subcapsular cataract; ERM epi-retinal membrane; PVD posterior vitreous detachment; RD retinal detachment; DM diabetes mellitus; DR diabetic retinopathy; NPDR non-proliferative diabetic retinopathy; PDR proliferative diabetic retinopathy; CSME clinically significant macular edema; DME diabetic macular edema; dbh dot blot hemorrhages; CWS cotton wool spot; POAG primary open angle glaucoma; C/D cup-to-disc ratio; HVF humphrey visual field; GVF goldmann visual field; OCT optical coherence tomography; IOP intraocular pressure; BRVO Branch retinal vein occlusion; CRVO central retinal vein occlusion; CRAO central retinal artery occlusion; BRAO branch retinal artery occlusion; RT retinal tear; SB scleral buckle; PPV pars plana vitrectomy; VH Vitreous hemorrhage; PRP panretinal laser photocoagulation; IVK intravitreal kenalog; VMT vitreomacular traction; MH Macular hole;  NVD neovascularization of the disc; NVE neovascularization elsewhere; AREDS age related eye disease study; ARMD age related macular degeneration; POAG primary open angle glaucoma; EBMD epithelial/anterior basement membrane dystrophy; ACIOL anterior chamber intraocular lens; IOL intraocular lens; PCIOL posterior chamber intraocular lens; Phaco/IOL phacoemulsification with intraocular lens placement; PRK photorefractive keratectomy; LASIK laser assisted in situ keratomileusis; HTN hypertension; DM diabetes mellitus; COPD chronic obstructive pulmonary disease

## 2023-08-10 ENCOUNTER — Other Ambulatory Visit: Payer: Self-pay | Admitting: Adult Health

## 2023-08-10 ENCOUNTER — Ambulatory Visit (INDEPENDENT_AMBULATORY_CARE_PROVIDER_SITE_OTHER): Payer: Medicare HMO | Admitting: Ophthalmology

## 2023-08-10 ENCOUNTER — Encounter (INDEPENDENT_AMBULATORY_CARE_PROVIDER_SITE_OTHER): Payer: Self-pay | Admitting: Ophthalmology

## 2023-08-10 DIAGNOSIS — H33321 Round hole, right eye: Secondary | ICD-10-CM | POA: Diagnosis not present

## 2023-08-10 DIAGNOSIS — B0052 Herpesviral keratitis: Secondary | ICD-10-CM

## 2023-08-10 DIAGNOSIS — H25812 Combined forms of age-related cataract, left eye: Secondary | ICD-10-CM

## 2023-08-10 DIAGNOSIS — Z961 Presence of intraocular lens: Secondary | ICD-10-CM

## 2023-08-10 DIAGNOSIS — E113313 Type 2 diabetes mellitus with moderate nonproliferative diabetic retinopathy with macular edema, bilateral: Secondary | ICD-10-CM

## 2023-08-10 DIAGNOSIS — Z7984 Long term (current) use of oral hypoglycemic drugs: Secondary | ICD-10-CM

## 2023-08-10 DIAGNOSIS — H35033 Hypertensive retinopathy, bilateral: Secondary | ICD-10-CM

## 2023-08-10 DIAGNOSIS — I10 Essential (primary) hypertension: Secondary | ICD-10-CM | POA: Diagnosis not present

## 2023-08-10 DIAGNOSIS — H04123 Dry eye syndrome of bilateral lacrimal glands: Secondary | ICD-10-CM

## 2023-08-10 DIAGNOSIS — E119 Type 2 diabetes mellitus without complications: Secondary | ICD-10-CM

## 2023-08-10 MED ORDER — AFLIBERCEPT 2MG/0.05ML IZ SOLN FOR KALEIDOSCOPE
2.0000 mg | INTRAVITREAL | Status: AC | PRN
Start: 2023-08-10 — End: 2023-08-10
  Administered 2023-08-10: 2 mg via INTRAVITREAL

## 2023-08-10 MED ORDER — VALACYCLOVIR HCL 1 G PO TABS: 2000.0000 mg | ORAL_TABLET | Freq: Every day | ORAL | 0 refills | Status: AC

## 2023-08-11 NOTE — Progress Notes (Signed)
[facility] - Clinic Note  08/25/2023     CHIEF COMPLAINT Patient presents for Retina Follow Up   HISTORY OF PRESENT ILLNESS: [name] is a 72 y.o. male who presents to the clinic today for:   HPI     Retina Follow Up   Patient presents with  Diabetic Retinopathy.  In both eyes.  This started 2 weeks ago.  I, the attending physician,  performed the HPI with the patient and updated documentation appropriately.        Comments   Patient here for 2 weeks retina follow up for NPDR OU. Patient states vision doing pretty good. No eye pain.       Last edited by Rennis Chris, MD on 08/25/2023 10:01 PM.    Patient states vision is stable, he has been taking Valtrex BID as instructed  Referring physician: Raymon Mutton., FNP [address] [place],  [place] [address]  HISTORICAL INFORMATION:   Selected notes from the MEDICAL RECORD NUMBER Referred by Dr. Fabian Sharp for concern of HTN Ret   CURRENT MEDICATIONS: Current Outpatient Medications (Ophthalmic Drugs)  Medication Sig   latanoprost (XALATAN) 0.005 % ophthalmic solution SMARTSIG:In Eye(s)   Polyvinyl Alcohol-Povidone (REFRESH OP) Place 1 drop into both eyes in the morning and at bedtime.   prednisoLONE acetate (PRED FORTE) 1 % ophthalmic suspension Place 1 drop into both eyes 6 (six) times daily.   No current facility-administered medications for this visit. (Ophthalmic Drugs)   Current Outpatient Medications (Other)  Medication Sig   aspirin 325 MG EC tablet aspirin 325 mg tablet,delayed release   atorvastatin (LIPITOR) 40 MG tablet Take 1 tablet (40 mg total) by mouth daily at 6 PM.   bacitracin ointment Apply 1 Application topically 2 (two) times daily.   carvedilol (COREG) 6.25 MG tablet Take 6.25 mg by mouth 2 (two) times daily with a meal.   donepezil (ARICEPT) 10 MG tablet TAKE 1 TABLET AT BEDTIME   hydrochlorothiazide (HYDRODIURIL) 25 MG tablet Take 25 mg by mouth daily.   losartan  (COZAAR) 100 MG tablet Take 100 mg by mouth daily.   memantine (NAMENDA) 10 MG tablet TAKE 1 TABLET TWICE DAILY   metFORMIN (GLUCOPHAGE) 500 MG tablet Take by mouth 2 (two) times daily with a meal.   Multiple Vitamins-Minerals (ONE-A-DAY MENS 50+ PO) Take 1 tablet by mouth daily.   mupirocin ointment (BACTROBAN) 2 % as needed.   PFIZER-BIONT COVID-19 VAC-TRIS SUSP injection    PFIZER-BIONTECH COVID-19 VACC 30 MCG/0.3ML injection    traZODone (DESYREL) 100 MG tablet Take 100 mg by mouth at bedtime.   furosemide (LASIX) 40 MG tablet Take 1 tablet (40 mg total) by mouth 2 (two) times daily for 4 days, THEN 1 tablet (40 mg total) daily.   Potassium Chloride ER 20 MEQ TBCR Take 1 tablet (20 mEq total) by mouth in the morning and at bedtime for 4 days, THEN 1 tablet (20 mEq total) daily.   Current Facility-Administered Medications (Other)  Medication Route   0.9 %  sodium chloride infusion Intravenous   0.9 %  sodium chloride infusion Intravenous   REVIEW OF SYSTEMS: ROS   Positive for: Endocrine, Eyes Negative for: Constitutional, Gastrointestinal, Neurological, Skin, Genitourinary, Musculoskeletal, HENT, Cardiovascular, Respiratory, Psychiatric, Allergic/Imm, Heme/Lymph Last edited by Laddie Aquas, COA on 08/25/2023  8:59 AM.      ALLERGIES Allergies  Allergen Reactions   Shellfish Allergy Anaphylaxis   PAST MEDICAL HISTORY Past Medical History:  Diagnosis Date   Acute cerebrovascular accident (CVA) (HCC) 01/20/2020   Benign essential HTN 01/20/2020   Cataract    Mixed form OU   Dementia (HCC)    Diabetic retinopathy (HCC)    NPDR OU   Hypertensive retinopathy    OU   Seizures (HCC)    per pt- "none since teenage years"   Past Surgical History:  Procedure Laterality Date   LOOP RECORDER INSERTION N/A 01/23/2020   Procedure: LOOP RECORDER INSERTION;  Surgeon: Duke Salvia, MD;  Location: [facility] INVASIVE CV LAB;  Service: Cardiovascular;  Laterality: N/A;   FAMILY  HISTORY Family History  Problem Relation Age of Onset   COPD Mother    Diabetes Mother    Dementia Father    Diabetes Father    Cancer Father        prostate and lung   Colon cancer Neg Hx    Esophageal cancer Neg Hx    Stomach cancer Neg Hx    Rectal cancer Neg Hx    SOCIAL HISTORY Social History   Tobacco Use   Smoking status: Never   Smokeless tobacco: Never  Vaping Use   Vaping status: Never Used  Substance Use Topics   Alcohol use: Never   Drug use: Never       OPHTHALMIC EXAM: Base Eye Exam     Visual Acuity (Snellen - Linear)       Right Left   Dist [place] 20/150 -2 20/40   Dist ph  NI NI         Tonometry (Tonopen, 8:57 AM)       Right Left   Pressure 14 20         Pupils       Dark Light Shape React APD   Right 3 2 Round Brisk None   Left 3 2 Round Brisk None         Visual Fields (Counting fingers)       Left Right    Full Full         Extraocular Movement       Right Left    Full, Ortho Full, Ortho         Neuro/Psych     Oriented x3: Yes   Mood/Affect: Normal         Dilation     Both eyes: 1.0% Mydriacyl, 2.5% Phenylephrine @ 8:56 AM           Slit Lamp and Fundus Exam     Slit Lamp Exam       Right Left   Lids/Lashes Dermatochalasis - upper lid, MGD, Ptosis Dermatochalasis - upper lid   Conjunctiva/Sclera Nasal and temporal Pinguecula, , Melanosis, 2+ Injection Nasal and temporal Pinguecula, mild Melanosis   Cornea Arcus, 1-2+ Punctate epithelial erosions, well healed cataract wound, no dendrites, inferior KP Arcus, 1+Punctate epithelial erosions, mild tear film debris   Anterior Chamber Deep, 2+cell and pigment Deep, 2+cell and pigment   Iris Round and dilated, No NVI Round and dilated, No NVI   Lens PC IOL in good position PC IOL in good postition   Anterior Vitreous Vitreous syneresis Vitreous syneresis, Posterior vitreous detachment         Fundus Exam       Right Left   Disc sharp rim,  2-3+pallor, +cupping Sharp, mild Pallor   C/D Ratio 0.6 0.5   Macula Flat, good foveal reflex, scattered MA / DBH greatest superior mac Flat, good foveal reflex,  scattered Microaneurysms/DBH, cystic changes IT macula -- slightly increased, focal blot heme IN fovea   Vessels attenuated, mild tortuosity, focal flame heme along ST arcades attenuated, Tortuous, Telangiectasia   Periphery Attached, operculated hole at 1000 with partial pigment and +cuff of SRF -- good laser changes surrounding, scattered MA/DBH greatest posteriorly, White without pressure temporal periphery, good peripheral PRP changes 360, scattered DBH greatest superiorly Attached, scattered DBH, focal pigmented CR scar at 0130 equator, good peripheral 360 PRP           IMAGING AND PROCEDURES  Imaging and Procedures for @TODAY @  OCT, Retina - OU - Both Eyes       Right Eye Quality was borderline. Central Foveal Thickness: 259. Progression has worsened. Findings include normal foveal contour, no IRF, no SRF (mild diffuse retinal thinning -- stable, focal cystic changes IT mac, interval increase in cystic changes nasal and temporal fovea).   Left Eye Quality was poor. Central Foveal Thickness: 398. Progression has improved. Findings include normal foveal contour, no SRF, intraretinal hyper-reflective material, intraretinal fluid (Mild interval improvement in IRF / edema IT fovea and mac ).   Notes *Images captured and stored on drive  Diagnosis / Impression:  OD: NFP, no IRF / SRF -- mild diffuse retinal thinning -- stable, focal cystic changes IT mac, interval increase in cystic changes nasal and temporal fovea OS: NFP, no SRF, Mild interval improvement in IRF / edema IT fovea and mac    Clinical management:  See below  Abbreviations: NFP - Normal foveal profile. CME - cystoid macular edema. PED - pigment epithelial detachment. IRF - intraretinal fluid. SRF - subretinal fluid. EZ - ellipsoid zone. ERM - epiretinal  membrane. ORA - outer retinal atrophy. ORT - outer retinal tubulation. SRHM - subretinal hyper-reflective material      CUP PACEART REMOTE DEVICE CHECK      Component   Date Time Interrogation Session   [id number]     Pulse Generator Manufacturer   MERM     Pulse Gen Model   [id number] II     Pulse Gen Serial Number   [id number]     Clinic Name   [facility]     Implantable Pulse Generator Type   ICM/ILR     Implantable Pulse Generator Implant Date   [id number]           Linked Images                           ASSESSMENT/PLAN:    ICD-10-CM   1. Moderate nonproliferative diabetic retinopathy of both eyes with macular edema associated with type 2 diabetes mellitus (HCC)  E11.3313 OCT, Retina - OU - Both Eyes    2. Diabetes mellitus treated with oral medication (HCC)  E11.9    Z79.84     3. Retinal hole of right eye  H33.321     4. Essential hypertension  I10     5. Hypertensive retinopathy of both eyes  H35.033     6. Pseudophakia  Z96.1     7. Dry eyes  H04.123     8. Herpes keratitis of right eye  B00.52      1,2. Moderate non-proliferative diabetic retinopathy, OU - s/p IVA OS #1 (05.07.21), #2 (06.04.21), #3 (07.06.21), #4 (08.03.21), #5 (08.31.21), #6 (9.28.21), #7 (11.2.21), #8 (12.07.21), #9 (02.02.22), #10 (03.30.22), #11 (05.25.22), #12 (08.03.22), #13 (10.20.22), #14 (01.12.23), #15 (01.22.24), #16 (03.11.24), #17 (03.11.24), #18 (04.22.24), #19 (  06.03.24), #20 (07.15.24) - s/p IVE OS #1 (08.19.24)  - s/p PRP OS (04.09.21) -- good laser changes - s/p PRP OD (02.05.24) for peripheral vascular nonperfusion -- good early laser changes - repeat FA (09.28.21) shows late leaking MA OU, significant capillary drop-out  - repeat FA (01.23.24) shows OD: Scattered patches of vascular non-perfusion; large area of vascular non perfusion temporally, no NV; OS: delayed filling; scattered patches of vascular non perfusion peripherally, staining of 360  laser, scattered leaking MA -- most prominent IT to fovea, no NV  - exam shows scattered MA/IRH/CWS OU -- improving   - BCVA OD 20/150 - stable, OS 20/40 from 20/30 - OCT shows OD: mild diffuse retinal thinning -- stable, focal cystic changes IT mac, interval increase in cystic changes nasal and temporal fovea; OS: Mild interval improvement in IRF / edema IT fovea and mac at 2 weeks - IVE informed consent obtained and signed OS (08.19.24)  - f/u as scheduled on September 16  3. Operculated retinal hole w/ cuff of SRF / focal RD, right eye - operculated hole located at 1000 with partial pigment and +cuff of SRF / focal RD  - s/p retinopexy OD (03.17.21) -- good laser changes surrounding  - stable monitor  4,5. Hypertensive retinopathy OU  - discussed importance of tight BP control  - monitor  6. Pseudophakia OD  - s/p CE/IOL (Dr. Laruth Bouchard, 07.02.24)  - IOL in good position, doing well  - monitor  7. Dry eyes OU  - recommend artificial tears and lubricating ointment as needed  8 Herpes Keratitis, OD  - exam today with improved injection; no dendrites; still with +AC cell  - continue Valtrex 1mg  PO BID for 14 days -- finishing tomorrow - start PF 6x/day OU until next visit for [facility] cell - f/u as scheduled on September 16  Ophthalmic Meds Ordered this visit:  Meds ordered this encounter  Medications   prednisoLONE acetate (PRED FORTE) 1 % ophthalmic suspension    Sig: Place 1 drop into both eyes 6 (six) times daily.    Dispense:  15 mL    Refill:  0     Return for f/u as scheduled on September 16.  There are no Patient Instructions on file for this visit.  Explained the diagnoses, plan, and follow up with the patient and they expressed understanding.  Patient expressed understanding of the importance of proper follow up care.   This document serves as a record of services personally performed by Karie Chimera, MD, PhD. It was created on their behalf by Laurey Morale, COT  an ophthalmic technician. The creation of this record is the provider's dictation and/or activities during the visit.    Electronically signed by:  Charlette Caffey, COT  08/25/23 10:03 PM  This document serves as a record of services personally performed by Karie Chimera, MD, PhD. It was created on their behalf by Glee Arvin. Manson Passey, OA an ophthalmic technician. The creation of this record is the provider's dictation and/or activities during the visit.    Electronically signed by: Glee Arvin. Manson Passey, OA 08/25/23 10:03 PM  Karie Chimera, M.D., Ph.D. Diseases & Surgery of the Retina and Vitreous [facility]  I have reviewed the above documentation for accuracy and completeness, and I agree with the above. Karie Chimera, M.D., Ph.D. 08/25/23 10:06 PM  Abbreviations: M myopia (nearsighted); A astigmatism; H hyperopia (farsighted); P presbyopia; Mrx spectacle prescription;  CTL contact lenses;  OD right eye; OS left eye; OU both eyes  XT exotropia; ET esotropia; PEK punctate epithelial keratitis; PEE punctate epithelial erosions; DES dry eye syndrome; MGD meibomian gland dysfunction; ATs artificial tears; PFAT's preservative free artificial tears; NSC nuclear sclerotic cataract; PSC posterior subcapsular cataract; ERM epi-retinal membrane; PVD posterior vitreous detachment; RD retinal detachment; DM diabetes mellitus; DR diabetic retinopathy; NPDR non-proliferative diabetic retinopathy; PDR proliferative diabetic retinopathy; CSME clinically significant macular edema; DME diabetic macular edema; dbh dot blot hemorrhages; CWS cotton wool spot; POAG primary open angle glaucoma; C/D cup-to-disc ratio; HVF humphrey visual field; GVF goldmann visual field; OCT optical coherence tomography; IOP intraocular pressure; BRVO Branch retinal vein occlusion; CRVO central retinal vein occlusion; CRAO central retinal artery occlusion; BRAO branch retinal artery occlusion; RT retinal tear; SB  scleral buckle; PPV pars plana vitrectomy; VH Vitreous hemorrhage; PRP panretinal laser photocoagulation; IVK intravitreal kenalog; VMT vitreomacular traction; MH Macular hole;  NVD neovascularization of the disc; NVE neovascularization elsewhere; AREDS age related eye disease study; ARMD age related macular degeneration; POAG primary open angle glaucoma; EBMD epithelial/anterior basement membrane dystrophy; ACIOL anterior chamber intraocular lens; IOL intraocular lens; PCIOL posterior chamber intraocular lens; Phaco/IOL phacoemulsification with intraocular lens placement; PRK photorefractive keratectomy; LASIK laser assisted in situ keratomileusis; HTN hypertension; DM diabetes mellitus; COPD chronic obstructive pulmonary disease

## 2023-08-12 ENCOUNTER — Other Ambulatory Visit: Payer: Self-pay | Admitting: Adult Health

## 2023-08-18 ENCOUNTER — Encounter (HOSPITAL_BASED_OUTPATIENT_CLINIC_OR_DEPARTMENT_OTHER): Payer: Medicare HMO | Admitting: Internal Medicine

## 2023-08-18 DIAGNOSIS — G40909 Epilepsy, unspecified, not intractable, without status epilepticus: Secondary | ICD-10-CM | POA: Diagnosis not present

## 2023-08-18 DIAGNOSIS — E11622 Type 2 diabetes mellitus with other skin ulcer: Secondary | ICD-10-CM | POA: Diagnosis present

## 2023-08-18 DIAGNOSIS — S81802A Unspecified open wound, left lower leg, initial encounter: Secondary | ICD-10-CM | POA: Diagnosis not present

## 2023-08-18 DIAGNOSIS — I890 Lymphedema, not elsewhere classified: Secondary | ICD-10-CM

## 2023-08-18 DIAGNOSIS — I87313 Chronic venous hypertension (idiopathic) with ulcer of bilateral lower extremity: Secondary | ICD-10-CM | POA: Diagnosis not present

## 2023-08-18 DIAGNOSIS — I110 Hypertensive heart disease with heart failure: Secondary | ICD-10-CM | POA: Diagnosis not present

## 2023-08-18 DIAGNOSIS — S81801A Unspecified open wound, right lower leg, initial encounter: Secondary | ICD-10-CM | POA: Diagnosis not present

## 2023-08-18 DIAGNOSIS — I5042 Chronic combined systolic (congestive) and diastolic (congestive) heart failure: Secondary | ICD-10-CM | POA: Diagnosis not present

## 2023-08-18 DIAGNOSIS — Z833 Family history of diabetes mellitus: Secondary | ICD-10-CM | POA: Diagnosis not present

## 2023-08-18 DIAGNOSIS — I872 Venous insufficiency (chronic) (peripheral): Secondary | ICD-10-CM | POA: Diagnosis not present

## 2023-08-18 NOTE — Progress Notes (Signed)
[name], [name] ([id number]) [id number] Page 1 of 7 Visit Report for 08/18/2023 Chief Complaint Document Details Patient Name: Date of Service: [name], [name] [name] 08/18/2023 8:45 A M Medical Record Number: [id number] Patient Account Number: [id number] Date of Birth/Sex: Treating RN: 20-May-1951 (72 y.o. M) Primary Care Provider: [name] Other Clinician: Referring Provider: Treating Provider/Extender: Ebony Cargo in Treatment: 15 Information Obtained from: Patient Chief Complaint 05/05/2023; left lower extremity wound Electronic Signature(s) Signed: 08/18/2023 12:58:11 PM By: Geralyn Corwin DO Entered By: Geralyn Corwin on 08/18/2023 06:42:49 -------------------------------------------------------------------------------- HPI Details Patient Name: Date of Service: [name], [name] [name] 08/18/2023 8:45 A M Medical Record Number: [id number] Patient Account Number: [id number] Date of Birth/Sex: Treating RN: [birth year] (72 y.o. M) Primary Care Provider: [name] Other Clinician: Referring Provider: Treating Provider/Extender: Ebony Cargo in Treatment: 15 History of Present Illness HPI Description: CONSULT ONLY 04/28/2022 This is a 72 year old man with a past medical history notable for type 2 diabetes mellitus, stroke, and lower extremity edema. Apparently back in January, he did have a wound on his right lower extremity and was seen in the emergency department for this. I am not entirely sure how he ultimately was referred to the wound care center, but today he has no open wounds. He does have significant bilateral lower extremity edema, 2+ up to the knees. He does not wear compression stockings. He says that he applies Vaseline to his legs daily, but the skin is quite dry and scaly. ABIs in clinic today were 0.79 and 0.84. 05/05/2023 [name] is a 72 year old male with a past medical history of  cryptogenic right MCA stroke in 2021, chronic combined systolic and diastolic congestive heart failure, controlled type 2 diabetes on oral agents and lymphedema/chronic venous insufficiency that presents the clinic for a 1 week history of wound to his left lower extremity. He states it started as a blister and has ruptured creating the wound. He has been keeping the area covered. He does not wear compression stockings. He reports taking his Lasix as prescribed but has recently run out over the past day or 2. They are waiting on a refill at the pharmacy. Currently denies signs of infection. Denies shortness of breath or orthopnea. 5/21; patient presents for follow-up. We have been using Xeroform with antibiotic ointment under Kerlix/Coban to the left lower extremity. He has tolerated this well. He has no issues or complaints today. 5/30; patient presents for follow up. We have been using Xeroform and anitbiotic ointment under Kerlix/lower extremity. He took the wrap off due to increased itching to the periwound. Wounds are much smaller. No signs of infection. 6/7; we have been using Xeroform topical antibiotic under kerlix Coban. Unfortunately the patient simply will not leave the wrap in place. He came in with this rolled around his ankle. He says it is "itchy" his swelling is not well-controlled. 6/20; patient presents for follow-up. The dressing was changed at last clinic visit to Tubigrip and Hydrofera Blue to the left leg. This is much larger today. Unfortunately has developed a skin tear vs blister to the right lateral leg. 6/25; patient presents for follow-up. We have been using antibiotic ointment with Xeroform under Kerlix/Coban to the lower extremities bilaterally. Wounds are smaller. Unfortunately he keeps trying to take the wraps off. We are trying to get him home health but have not heard back yet. 7/1; patient presents for follow-up. We have been using antibiotic ointment with Xeroform  under Kerlix/Coban to the lower extremities bilaterally.  Wounds are smaller. [name], [name] ([id number]) [id number] Page 2 of 7 7/12; patient presents for follow-up. We have been using antibiotic ointment with Xeroform under Kerlix/Coban to the lower extremities bilaterally. The wounds have healed. He has compression stockings at home. 7/29; patient was discharged at last clinic visit due to closed wounds 2 weeks ago. He has not been wearing his compression stockings daily. He does not elevate his legs when sitting. He has developed a couple blisters to his left lower extremity. Nothing open. He is accompanied by his daughter today. 8/27; patient missed his last follow-up appointment. I saw him about 1 month ago. He states that he has had home health coming out wrapping his legs however we discontinued home health 1 month ago and have not been sending orders. I recommended at last clinic visit that he wear his compression stockings daily. Today he presents with no wounds. Electronic Signature(s) Signed: 08/18/2023 12:58:11 PM By: Geralyn Corwin DO Entered By: Geralyn Corwin on 08/18/2023 06:44:28 -------------------------------------------------------------------------------- Physical Exam Details Patient Name: Date of Service: [name], [name] 08/18/2023 8:45 A M Medical Record Number: [id number] Patient Account Number: [id number] Date of Birth/Sex: Treating RN: 31-May-1951 (72 y.o. M) Primary Care Provider: [name] Other Clinician: Referring Provider: Treating Provider/Extender: Ebony Cargo in Treatment: 15 Constitutional respirations regular, non-labored and within target range for patient.. Cardiovascular 2+ dorsalis pedis/posterior tibialis pulses. Psychiatric pleasant and cooperative. Notes No open wounds to the lower extremities bilaterally. No blisters. 2+ pitting edema to the knees bilaterally. Electronic  Signature(s) Signed: 08/18/2023 12:58:11 PM By: Geralyn Corwin DO Entered By: Geralyn Corwin on 08/18/2023 06:45:24 -------------------------------------------------------------------------------- Physician Orders Details Patient Name: Date of Service: [name], [name] [name] 08/18/2023 8:45 A M Medical Record Number: [id number] Patient Account Number: [id number] Date of Birth/Sex: Treating RN: Apr 06, 1951 (72 y.o. [name] Primary Care Provider: [name] Other Clinician: Referring Provider: Treating Provider/Extender: Ebony Cargo in Treatment: 15 Verbal / Phone Orders: No Diagnosis Coding ICD-10 Coding Code Description [phone] Unspecified open wound, left lower leg, initial encounter S81.801A Unspecified open wound, right lower leg, initial encounter I87.313 Chronic venous hypertension (idiopathic) with ulcer of bilateral lower extremity I89.0 Lymphedema, not elsewhere classified [name], [name] ([id number]) [id number] Page 3 of 7 E11.622 Type 2 diabetes mellitus with other skin ulcer I50.42 Chronic combined systolic (congestive) and diastolic (congestive) heart failure Discharge From [facility] Services Discharge from Wound Care Center - Wear compression stockings for life. Call if you future wound care needs. Wound Center will call your home health nurse to make aware no wounds will discharge from clinic. Edema Control - Lymphedema / SCD / Other Elevate legs to the level of the heart or above for 30 minutes daily and/or when sitting for 3-4 times a day throughout the day. Avoid standing for long periods of time. Patient to wear own compression stockings every day. Exercise regularly Moisturize legs daily. Electronic Signature(s) Signed: 08/18/2023 12:58:11 PM By: Geralyn Corwin DO Entered By: Geralyn Corwin on 08/18/2023 06:45:32 -------------------------------------------------------------------------------- Problem  List Details Patient Name: Date of Service: JORDY, FOLTYN [name] 08/18/2023 8:45 A M Medical Record Number: [id number] Patient Account Number: [id number] Date of Birth/Sex: Treating RN: 03/30/1951 (72 y.o. M) Primary Care Provider: [name] Other Clinician: Referring Provider: Treating Provider/Extender: Ebony Cargo in Treatment: 15 Active Problems ICD-10 Encounter Code Description Active Date MDM Diagnosis S81.802A Unspecified open wound, left lower leg, initial encounter 05/05/2023 No Yes S81.801A Unspecified open wound, right lower leg, initial encounter 06/11/2023 No Yes  Y86.578 Chronic venous hypertension (idiopathic) with ulcer of bilateral lower extremity 06/11/2023 No Yes I89.0 Lymphedema, not elsewhere classified 05/05/2023 No Yes E11.622 Type 2 diabetes mellitus with other skin ulcer 05/05/2023 No Yes I50.42 Chronic combined systolic (congestive) and diastolic (congestive) heart failure 05/05/2023 No Yes Inactive Problems Resolved Problems Electronic Signature(s) Signed: 08/18/2023 12:58:11 PM By: Geralyn Corwin DO Entered By: Geralyn Corwin on 08/18/2023 06:42:32 Mcdermott, Sang ([id number]) [id number] Page 4 of 7 -------------------------------------------------------------------------------- Progress Note Details Patient Name: Date of Service: [name], [name] [name] 08/18/2023 8:45 A M Medical Record Number: [id number] Patient Account Number: [id number] Date of Birth/Sex: Treating RN: February 20, 1951 (72 y.o. M) Primary Care Provider: [name] Other Clinician: Referring Provider: Treating Provider/Extender: Ebony Cargo in Treatment: 15 Subjective Chief Complaint Information obtained from Patient 05/05/2023; left lower extremity wound History of Present Illness (HPI) CONSULT ONLY 04/28/2022 This is a 72 year old man with a past medical history notable for type 2 diabetes mellitus, stroke, and lower  extremity edema. Apparently back in January, he did have a wound on his right lower extremity and was seen in the emergency department for this. I am not entirely sure how he ultimately was referred to the wound care center, but today he has no open wounds. He does have significant bilateral lower extremity edema, 2+ up to the knees. He does not wear compression stockings. He says that he applies Vaseline to his legs daily, but the skin is quite dry and scaly. ABIs in clinic today were 0.79 and 0.84. 05/05/2023 [name] is a 72 year old male with a past medical history of cryptogenic right MCA stroke in 2021, chronic combined systolic and diastolic congestive heart failure, controlled type 2 diabetes on oral agents and lymphedema/chronic venous insufficiency that presents the clinic for a 1 week history of wound to his left lower extremity. He states it started as a blister and has ruptured creating the wound. He has been keeping the area covered. He does not wear compression stockings. He reports taking his Lasix as prescribed but has recently run out over the past day or 2. They are waiting on a refill at the pharmacy. Currently denies signs of infection. Denies shortness of breath or orthopnea. 5/21; patient presents for follow-up. We have been using Xeroform with antibiotic ointment under Kerlix/Coban to the left lower extremity. He has tolerated this well. He has no issues or complaints today. 5/30; patient presents for follow up. We have been using Xeroform and anitbiotic ointment under Kerlix/lower extremity. He took the wrap off due to increased itching to the periwound. Wounds are much smaller. No signs of infection. 6/7; we have been using Xeroform topical antibiotic under kerlix Coban. Unfortunately the patient simply will not leave the wrap in place. He came in with this rolled around his ankle. He says it is "itchy" his swelling is not well-controlled. 6/20; patient presents  for follow-up. The dressing was changed at last clinic visit to Tubigrip and Hydrofera Blue to the left leg. This is much larger today. Unfortunately has developed a skin tear vs blister to the right lateral leg. 6/25; patient presents for follow-up. We have been using antibiotic ointment with Xeroform under Kerlix/Coban to the lower extremities bilaterally. Wounds are smaller. Unfortunately he keeps trying to take the wraps off. We are trying to get him home health but have not heard back yet. 7/1; patient presents for follow-up. We have been using antibiotic ointment with Xeroform under Kerlix/Coban to the lower extremities bilaterally. Wounds are smaller. 7/12;  patient presents for follow-up. We have been using antibiotic ointment with Xeroform under Kerlix/Coban to the lower extremities bilaterally. The wounds have healed. He has compression stockings at home. 7/29; patient was discharged at last clinic visit due to closed wounds 2 weeks ago. He has not been wearing his compression stockings daily. He does not elevate his legs when sitting. He has developed a couple blisters to his left lower extremity. Nothing open. He is accompanied by his daughter today. 8/27; patient missed his last follow-up appointment. I saw him about 1 month ago. He states that he has had home health coming out wrapping his legs however we discontinued home health 1 month ago and have not been sending orders. I recommended at last clinic visit that he wear his compression stockings daily. Today he presents with no wounds. Patient History Information obtained from Patient, Chart. Family History Unknown History, Cancer - Father, Diabetes - Mother, Lung Disease - Mother. Social History Never smoker, Marital Status - Single, Alcohol Use - Never, Drug Use - No History, Caffeine Use - Daily - coffee. Medical History Eyes Patient has history of Cataracts Denies history of Glaucoma, Optic Neuritis Cardiovascular Patient  has history of Hypertension Endocrine Patient has history of Type II Diabetes Integumentary (Skin) Denies history of History of Burn Neurologic [name], [name] ([id number]) [id number] Page 5 of 7 Patient has history of Seizure Disorder - CVA Denies history of Dementia, Neuropathy, Quadriplegia, Paraplegia Hospitalization/Surgery History - Loop Recorder insertion 01/23/2020. Medical A Surgical History Notes nd Eyes Diabetic Retinopathy Hypertensive Retinopathy Left Homonymous Hemianopsia Cardiovascular Acute Cerebrovascular Accident Neurologic Memory Change Objective Constitutional respirations regular, non-labored and within target range for patient.. Vitals Time Taken: 9:16 AM, Height: 67 in, Weight: 193 lbs, BMI: 30.2, Temperature: 99.2 F, Pulse: 69 bpm, Respiratory Rate: 18 breaths/min, Blood Pressure: 135/70 mmHg. Cardiovascular 2+ dorsalis pedis/posterior tibialis pulses. Psychiatric pleasant and cooperative. General Notes: No open wounds to the lower extremities bilaterally. No blisters. 2+ pitting edema to the knees bilaterally. Assessment Active Problems ICD-10 Unspecified open wound, left lower leg, initial encounter Unspecified open wound, right lower leg, initial encounter Chronic venous hypertension (idiopathic) with ulcer of bilateral lower extremity Lymphedema, not elsewhere classified Type 2 diabetes mellitus with other skin ulcer Chronic combined systolic (congestive) and diastolic (congestive) heart failure Patient presents today with no open wounds. I recommended compression stockings daily. We will follow-up with home health. He knows to call with any questions or concerns. He may follow-up as needed. Plan Discharge From [facility] Services: Discharge from Wound Care Center - Wear compression stockings for life. Call if you future wound care needs. Wound Center will call your home health nurse to make aware no wounds will discharge  from clinic. Edema Control - Lymphedema / SCD / Other: Elevate legs to the level of the heart or above for 30 minutes daily and/or when sitting for 3-4 times a day throughout the day. Avoid standing for long periods of time. Patient to wear own compression stockings every day. Exercise regularly Moisturize legs daily. 1. Daily compression stockings 2. Follow-up as needed 3. Discharge from clinic due to closed wounds Electronic Signature(s) Signed: 08/18/2023 12:58:11 PM By: Geralyn Corwin DO [name], [name] [phone] By: Geralyn Corwin DO [id number] Page 6 of 7 Signed: 08/18/2023 12:58:11 Entered By: Geralyn Corwin on 08/18/2023 06:50:38 -------------------------------------------------------------------------------- HxROS Details Patient Name: Date of Service: [name], [name] [name] 08/18/2023 8:45 A M Medical Record Number: [id number] Patient Account Number: [id number] Date of Birth/Sex: Treating RN: 1951/06/08 (72 y.o. M) Primary Care  Provider: [name] Other Clinician: Referring Provider: Treating Provider/Extender: Ebony Cargo in Treatment: 15 Information Obtained From Patient Chart Eyes Medical History: Positive for: Cataracts Negative for: Glaucoma; Optic Neuritis Past Medical History Notes: Diabetic Retinopathy Hypertensive Retinopathy Left Homonymous Hemianopsia Cardiovascular Medical History: Positive for: Hypertension Past Medical History Notes: Acute Cerebrovascular Accident Endocrine Medical History: Positive for: Type II Diabetes Treated with: Oral agents Blood sugar tested every day: Yes Tested : every day Integumentary (Skin) Medical History: Negative for: History of Burn Neurologic Medical History: Positive for: Seizure Disorder - CVA Negative for: Dementia; Neuropathy; Quadriplegia; Paraplegia Past Medical History Notes: Memory Change HBO Extended History  Items Eyes: Cataracts Immunizations Pneumococcal Vaccine: Received Pneumococcal Vaccination: No Implantable Devices None Hospitalization / Surgery History Type of Hospitalization/Surgery Loop Recorder insertion 01/23/2020 Family and Social History [place], [place] ([id number]) [id number] Page 7 of 7 Unknown History: Yes; Cancer: Yes - Father; Diabetes: Yes - Mother; Lung Disease: Yes - Mother; Never smoker; Marital Status - Single; Alcohol Use: Never; Drug Use: No History; Caffeine Use: Daily - coffee; Financial Concerns: No; Food, Clothing or Shelter Needs: No; Support System Lacking: No; Transportation Concerns: No Electronic Signature(s) Signed: 08/18/2023 12:58:11 PM By: Geralyn Corwin DO Entered By: Geralyn Corwin on 08/18/2023 06:44:35 -------------------------------------------------------------------------------- SuperBill Details Patient Name: Date of Service: [name], [name] 08/18/2023 Medical Record Number: [id number] Patient Account Number: [id number] Date of Birth/Sex: Treating RN: [birth year] (72 y.o. Harlon Flor, Millard.Loa Primary Care Provider: [name] Other Clinician: Referring Provider: Treating Provider/Extender: Ebony Cargo in Treatment: 15 Diagnosis Coding ICD-10 Codes Code Description [phone] Unspecified open wound, left lower leg, initial encounter S81.801A Unspecified open wound, right lower leg, initial encounter I87.313 Chronic venous hypertension (idiopathic) with ulcer of bilateral lower extremity I89.0 Lymphedema, not elsewhere classified E11.622 Type 2 diabetes mellitus with other skin ulcer I50.42 Chronic combined systolic (congestive) and diastolic (congestive) heart failure Facility Procedures : CPT4 Code: [id number] Description: 40347 - WOUND CARE VISIT-LEV 2 EST PT Modifier: Quantity: 1 Physician Procedures : CPT4 Code Description Modifier [id number] - WC PHYS LEVEL 3 - EST PT ICD-10  Diagnosis Description S81.802A Unspecified open wound, left lower leg, initial encounter S81.801A Unspecified open wound, right lower leg, initial encounter I87.313 Chronic  venous hypertension (idiopathic) with ulcer of bilateral lower extremity I89.0 Lymphedema, not elsewhere classified Quantity: 1 Electronic Signature(s) Signed: 08/18/2023 12:58:11 PM By: Geralyn Corwin DO Entered By: Geralyn Corwin on 08/18/2023 06:50:55

## 2023-08-19 NOTE — Progress Notes (Signed)
Habeeb, Dayon ([id number]) [id number] Page 1 of 6 Visit Report for 08/18/2023 Arrival Information Details Patient Name: Date of Service: [name], [name] ESTER 08/18/2023 8:45 A M Medical Record Number: [id number] Patient Account Number: [id number] Date of Birth/Sex: Treating RN: August 03, 1951 (72 y.o. M) Primary Care Modell Fendrick: [name] Other Clinician: Referring Makenna Macaluso: Treating Laetitia Schnepf/Extender: Ebony Cargo in Treatment: 15 Visit Information History Since Last Visit Added or deleted any medications: No Patient Arrived: Ambulatory Any new allergies or adverse reactions: No Arrival Time: 09:14 Had a fall or experienced change in No Accompanied By: daughter activities of daily living that may affect Transfer Assistance: None risk of falls: Patient Identification Verified: Yes Signs or symptoms of abuse/neglect since last visito No Secondary Verification Process Completed: Yes Hospitalized since last visit: No Patient Requires Transmission-Based Precautions: No Implantable device outside of the clinic excluding No Patient Has Alerts: No cellular tissue based products placed in the center since last visit: Pain Present Now: No Electronic Signature(s) Signed: 08/18/2023 4:24:20 PM By: Thayer Dallas Entered By: Thayer Dallas on 08/18/2023 09:14:26 -------------------------------------------------------------------------------- Clinic Level of Care Assessment Details Patient Name: Date of Service: [name], [name] ESTER 08/18/2023 8:45 A M Medical Record Number: [id number] Patient Account Number: [id number] Date of Birth/Sex: Treating RN: 1951-07-03 (72 y.o. Harlon Flor, Millard.Loa Primary Care Tinamarie Przybylski: [name] Other Clinician: Referring Larua Collier: Treating Terryn Redner/Extender: Ebony Cargo in Treatment: 15 Clinic Level of Care Assessment Items TOOL 4 Quantity Score X- 1 0 Use when only an EandM is performed on  FOLLOW-UP visit ASSESSMENTS - Nursing Assessment / Reassessment X- 1 10 Reassessment of Co-morbidities (includes updates in patient status) X- 1 5 Reassessment of Adherence to Treatment Plan ASSESSMENTS - Wound and Skin A ssessment / Reassessment []  - 0 Simple Wound Assessment / Reassessment - one wound []  - 0 Complex Wound Assessment / Reassessment - multiple wounds X- 1 10 Dermatologic / Skin Assessment (not related to wound area) ASSESSMENTS - Focused Assessment []  - 0 Circumferential Edema Measurements - multi extremities []  - 0 Nutritional Assessment / Counseling / Intervention []  - 0 Lower Extremity Assessment (monofilament, tuning fork, pulses) Santistevan, Harshal ([id number]) [id number] Page 2 of 6 []  - 0 Peripheral Arterial Disease Assessment (using hand held doppler) ASSESSMENTS - Ostomy and/or Continence Assessment and Care []  - 0 Incontinence Assessment and Management []  - 0 Ostomy Care Assessment and Management (repouching, etc.) PROCESS - Coordination of Care X - Simple Patient / Family Education for ongoing care 1 15 []  - 0 Complex (extensive) Patient / Family Education for ongoing care X- 1 10 Staff obtains Chiropractor, Records, T Results / Process Orders est X- 1 10 Staff telephones HHA, Nursing Homes / Clarify orders / etc []  - 0 Routine Transfer to another Facility (non-emergent condition) []  - 0 Routine Hospital Admission (non-emergent condition) []  - 0 New Admissions / Manufacturing engineer / Ordering NPWT Apligraf, etc. , []  - 0 Emergency Hospital Admission (emergent condition) X- 1 10 Simple Discharge Coordination []  - 0 Complex (extensive) Discharge Coordination PROCESS - Special Needs []  - 0 Pediatric / Minor Patient Management []  - 0 Isolation Patient Management []  - 0 Hearing / Language / Visual special needs []  - 0 Assessment of Community assistance (transportation, D/C planning, etc.) []  - 0 Additional  assistance / Altered mentation []  - 0 Support Surface(s) Assessment (bed, cushion, seat, etc.) INTERVENTIONS - Wound Cleansing / Measurement []  - 0 Simple Wound Cleansing - one wound []  - 0 Complex Wound Cleansing - multiple wounds []  -  0 Wound Imaging (photographs - any number of wounds) []  - 0 Wound Tracing (instead of photographs) []  - 0 Simple Wound Measurement - one wound []  - 0 Complex Wound Measurement - multiple wounds INTERVENTIONS - Wound Dressings []  - 0 Small Wound Dressing one or multiple wounds []  - 0 Medium Wound Dressing one or multiple wounds []  - 0 Large Wound Dressing one or multiple wounds []  - 0 Application of Medications - topical []  - 0 Application of Medications - injection INTERVENTIONS - Miscellaneous []  - 0 External ear exam []  - 0 Specimen Collection (cultures, biopsies, blood, body fluids, etc.) []  - 0 Specimen(s) / Culture(s) sent or taken to Lab for analysis []  - 0 Patient Transfer (multiple staff / Nurse, adult / Similar devices) []  - 0 Simple Staple / Suture removal (25 or less) []  - 0 Complex Staple / Suture removal (26 or more) []  - 0 Hypo / Hyperglycemic Management (close monitor of Blood Glucose) []  - 0 Ankle / Brachial Index (ABI) - do not check if billed separately Cheslock, Les ([id number]) [id number] Page 3 of 6 X- 1 5 Vital Signs Has the patient been seen at the hospital within the last three years: Yes Total Score: 75 Level Of Care: New/Established - Level 2 Electronic Signature(s) Signed: 08/18/2023 5:33:37 PM By: Shawn Stall RN, BSN Entered By: Shawn Stall on 08/18/2023 09:42:52 -------------------------------------------------------------------------------- Encounter Discharge Information Details Patient Name: Date of Service: [name], [name] ESTER 08/18/2023 8:45 A M Medical Record Number: [id number] Patient Account Number: [id number] Date of Birth/Sex: Treating RN: September 26, 1951 (72 y.o.  [name] Primary Care Bayli Quesinberry: [name] Other Clinician: Referring Pratt Bress: Treating Siddhi Dornbush/Extender: Ebony Cargo in Treatment: 15 Encounter Discharge Information Items Discharge Condition: Stable Ambulatory Status: Ambulatory Discharge Destination: Home Transportation: Private Auto Accompanied By: daughter Schedule Follow-up Appointment: No Clinical Summary of Care: Electronic Signature(s) Signed: 08/18/2023 5:33:37 PM By: Shawn Stall RN, BSN Entered By: Shawn Stall on 08/18/2023 09:43:17 -------------------------------------------------------------------------------- Lower Extremity Assessment Details Patient Name: Date of Service: [name], [name] ESTER 08/18/2023 8:45 A M Medical Record Number: [id number] Patient Account Number: [id number] Date of Birth/Sex: Treating RN: 12-Aug-1951 (72 y.o. M) Primary Care Shelbylynn Walczyk: [name] Other Clinician: Referring Vita Currin: Treating Jency Schnieders/Extender: Ebony Cargo in Treatment: 15 Edema Assessment Assessed: Kyra Searles: No] Franne Forts: No] Edema: [Left: Yes] [Right: Yes] Calf Left: Right: Point of Measurement: From Medial Instep 38 cm 39 cm Ankle Left: Right: Point of Measurement: From Medial Instep 23 cm 22.3 cm Vascular Assessment Extremity colors, hair growth, and conditions: Spang, Stanley ([id number]) [Right:[id number] Page 4 of 6] Extremity Color: [Left:Hyperpigmented] [Right:Hyperpigmented] Hair Growth on Extremity: [Left:No] [Right:No] Temperature of Extremity: [Left:Warm] [Right:Warm] Capillary Refill: [Left:< 3 seconds] [Right:< 3 seconds] Dependent Rubor: [Left:No No] [Right:No No] Toe Nail Assessment Left: Right: Thick: Yes Yes Discolored: Yes Yes Deformed: Yes Yes Improper Length and Hygiene: Yes Yes Electronic Signature(s) Signed: 08/18/2023 4:24:20 PM By: Thayer Dallas Entered By: Thayer Dallas on 08/18/2023  09:31:57 -------------------------------------------------------------------------------- Multi Wound Chart Details Patient Name: Date of Service: [name], [name] ESTER 08/18/2023 8:45 A M Medical Record Number: [id number] Patient Account Number: [id number] Date of Birth/Sex: Treating RN: [birth year] (72 y.o. M) Primary Care Trayvion Embleton: [name] Other Clinician: Referring Toinette Lackie: Treating Calyb Mcquarrie/Extender: Ebony Cargo in Treatment: 15 Vital Signs Height(in): 67 Pulse(bpm): 69 Weight(lbs): 193 Blood Pressure(mmHg): 135/70 Body Mass Index(BMI): 30.2 Temperature(F): 99.2 Respiratory Rate(breaths/min): 18 [Treatment Notes:Wound Assessments Treatment Notes] Electronic Signature(s) Signed: 08/18/2023 12:58:11 PM By: Geralyn Corwin DO Entered By: Mikey Bussing,  Shanda Bumps on 08/18/2023 09:42:37 -------------------------------------------------------------------------------- Multi-Disciplinary Care Plan Details Patient Name: Date of Service: [name], [name] ESTER 08/18/2023 8:45 A M Medical Record Number: [id number] Patient Account Number: [id number] Date of Birth/Sex: Treating RN: 06-10-1951 (72 y.o. [name] Primary Care Lister Brizzi: [name] Other Clinician: Referring Gwyndolyn Guilford: Treating Benigno Check/Extender: Ebony Cargo in Treatment: [address] Inactive Electronic Signature(s) Evitt, [place] ([id number]) [id number] Page 5 of 6 Signed: 08/18/2023 5:33:37 PM By: Shawn Stall RN, BSN Entered By: Shawn Stall on 08/18/2023 09:40:07 -------------------------------------------------------------------------------- Pain Assessment Details Patient Name: Date of Service: [name], [name] ESTER 08/18/2023 8:45 A M Medical Record Number: [id number] Patient Account Number: [id number] Date of Birth/Sex: Treating RN: 1951-08-07 (72 y.o. M) Primary Care Nadalyn Deringer: [name] Other Clinician: Referring Alzada Brazee: Treating  Fin Hupp/Extender: Ebony Cargo in Treatment: 15 Active Problems Location of Pain Severity and Description of Pain Patient Has Paino No Site Locations Pain Management and Medication Current Pain Management: Electronic Signature(s) Signed: 08/18/2023 4:24:20 PM By: Thayer Dallas Entered By: Thayer Dallas on 08/18/2023 09:14:35 -------------------------------------------------------------------------------- Patient/Caregiver Education Details Patient Name: Date of Service: [name] ESTER 8/27/2024andnbsp8:45 A M Medical Record Number: [id number] Patient Account Number: [id number] Date of Birth/Gender: Treating RN: [birth year] (72 y.o. [name] Primary Care Physician: [name] Other Clinician: Referring Physician: Treating Physician/Extender: Ebony Cargo in Treatment: 15 Education Assessment Education Provided To: Patient [name], [name] ([id number]) [id number] Page 6 of 6 Education Topics Provided Wound/Skin Impairment: Handouts: Caring for Your Ulcer Methods: Explain/Verbal Responses: Reinforcements needed Electronic Signature(s) Signed: 08/18/2023 5:33:37 PM By: Shawn Stall RN, BSN Entered By: Shawn Stall on 08/18/2023 09:42:23 -------------------------------------------------------------------------------- Vitals Details Patient Name: Date of Service: [name], [name] ESTER 08/18/2023 8:45 A M Medical Record Number: [id number] Patient Account Number: [id number] Date of Birth/Sex: Treating RN: Apr 24, 1951 (72 y.o. M) Primary Care Elajah Kunsman: [name] Other Clinician: Referring Amra Shukla: Treating Amity Roes/Extender: Ebony Cargo in Treatment: 15 Vital Signs Time Taken: 09:16 Temperature (F): 99.2 Height (in): 67 Pulse (bpm): 69 Weight (lbs): 193 Respiratory Rate (breaths/min): 18 Body Mass Index (BMI): 30.2 Blood Pressure (mmHg): 135/70 Reference Range: 80 -  120 mg / dl Electronic Signature(s) Signed: 08/18/2023 4:24:20 PM By: Thayer Dallas Entered By: Thayer Dallas on 08/18/2023 09:16:31

## 2023-08-20 LAB — CUP PACEART REMOTE DEVICE CHECK
Date Time Interrogation Session: 20240828231407
Implantable Pulse Generator Implant Date: 20210201

## 2023-08-25 ENCOUNTER — Encounter (INDEPENDENT_AMBULATORY_CARE_PROVIDER_SITE_OTHER): Payer: Self-pay | Admitting: Ophthalmology

## 2023-08-25 ENCOUNTER — Ambulatory Visit (INDEPENDENT_AMBULATORY_CARE_PROVIDER_SITE_OTHER): Payer: Medicare HMO

## 2023-08-25 ENCOUNTER — Ambulatory Visit (INDEPENDENT_AMBULATORY_CARE_PROVIDER_SITE_OTHER): Payer: Medicare HMO | Admitting: Ophthalmology

## 2023-08-25 DIAGNOSIS — E113313 Type 2 diabetes mellitus with moderate nonproliferative diabetic retinopathy with macular edema, bilateral: Secondary | ICD-10-CM

## 2023-08-25 DIAGNOSIS — Z7984 Long term (current) use of oral hypoglycemic drugs: Secondary | ICD-10-CM | POA: Diagnosis not present

## 2023-08-25 DIAGNOSIS — I639 Cerebral infarction, unspecified: Secondary | ICD-10-CM | POA: Diagnosis not present

## 2023-08-25 DIAGNOSIS — Z961 Presence of intraocular lens: Secondary | ICD-10-CM

## 2023-08-25 DIAGNOSIS — I10 Essential (primary) hypertension: Secondary | ICD-10-CM | POA: Diagnosis not present

## 2023-08-25 DIAGNOSIS — H33321 Round hole, right eye: Secondary | ICD-10-CM

## 2023-08-25 DIAGNOSIS — H35033 Hypertensive retinopathy, bilateral: Secondary | ICD-10-CM | POA: Diagnosis not present

## 2023-08-25 DIAGNOSIS — H04123 Dry eye syndrome of bilateral lacrimal glands: Secondary | ICD-10-CM

## 2023-08-25 DIAGNOSIS — E119 Type 2 diabetes mellitus without complications: Secondary | ICD-10-CM

## 2023-08-25 DIAGNOSIS — B0052 Herpesviral keratitis: Secondary | ICD-10-CM

## 2023-08-25 MED ORDER — PREDNISOLONE ACETATE 1 % OP SUSP: 1.0000 [drp] | Freq: Every day | OPHTHALMIC | 0 refills | Status: DC

## 2023-09-02 NOTE — Progress Notes (Signed)
[facility] - Clinic Note  09/07/2023     CHIEF COMPLAINT Patient presents for Retina Follow Up   HISTORY OF PRESENT ILLNESS: [name] is a 72 y.o. male who presents to the clinic today for:   HPI     Retina Follow Up   Patient presents with  Diabetic Retinopathy.  In both eyes.  This started 4 weeks ago.  Duration of 4 weeks.  Since onset it is stable.  I, the attending physician,  performed the HPI with the patient and updated documentation appropriately.        Comments   4 week retina follow up NPDR OU and IVA OU pt is reporting no vision changes noticed he denies any flashes or floaters pt last reading is unknown last A1C 6 he is using pred 6 times per day OU       Last edited by Rennis Chris, MD on 09/07/2023  5:29 PM.    Patient states   Referring physician: Raymon Mutton., FNP [address] [place],  [place] [address]  HISTORICAL INFORMATION:   Selected notes from the MEDICAL RECORD NUMBER Referred by Dr. Fabian Sharp for concern of HTN Ret   CURRENT MEDICATIONS: Current Outpatient Medications (Ophthalmic Drugs)  Medication Sig   latanoprost (XALATAN) 0.005 % ophthalmic solution SMARTSIG:In Eye(s)   Polyvinyl Alcohol-Povidone (REFRESH OP) Place 1 drop into both eyes in the morning and at bedtime.   prednisoLONE acetate (PRED FORTE) 1 % ophthalmic suspension Place 1 drop into both eyes 6 (six) times daily.   No current facility-administered medications for this visit. (Ophthalmic Drugs)   Current Outpatient Medications (Other)  Medication Sig   aspirin 325 MG EC tablet aspirin 325 mg tablet,delayed release   atorvastatin (LIPITOR) 40 MG tablet Take 1 tablet (40 mg total) by mouth daily at 6 PM.   bacitracin ointment Apply 1 Application topically 2 (two) times daily.   carvedilol (COREG) 6.25 MG tablet Take 6.25 mg by mouth 2 (two) times daily with a meal.   donepezil (ARICEPT) 10 MG tablet TAKE 1 TABLET AT BEDTIME   furosemide  (LASIX) 40 MG tablet Take 1 tablet (40 mg total) by mouth 2 (two) times daily for 4 days, THEN 1 tablet (40 mg total) daily.   hydrochlorothiazide (HYDRODIURIL) 25 MG tablet Take 25 mg by mouth daily.   losartan (COZAAR) 100 MG tablet Take 100 mg by mouth daily.   memantine (NAMENDA) 10 MG tablet TAKE 1 TABLET TWICE DAILY   metFORMIN (GLUCOPHAGE) 500 MG tablet Take by mouth 2 (two) times daily with a meal.   Multiple Vitamins-Minerals (ONE-A-DAY MENS 50+ PO) Take 1 tablet by mouth daily.   mupirocin ointment (BACTROBAN) 2 % as needed.   PFIZER-BIONT COVID-19 VAC-TRIS SUSP injection    PFIZER-BIONTECH COVID-19 VACC 30 MCG/0.3ML injection    Potassium Chloride ER 20 MEQ TBCR Take 1 tablet (20 mEq total) by mouth in the morning and at bedtime for 4 days, THEN 1 tablet (20 mEq total) daily.   traZODone (DESYREL) 100 MG tablet Take 100 mg by mouth at bedtime.   Current Facility-Administered Medications (Other)  Medication Route   0.9 %  sodium chloride infusion Intravenous   0.9 %  sodium chloride infusion Intravenous   REVIEW OF SYSTEMS: ROS   Positive for: Endocrine, Eyes Negative for: Constitutional, Gastrointestinal, Neurological, Skin, Genitourinary, Musculoskeletal, HENT, Cardiovascular, Respiratory, Psychiatric, Allergic/Imm, Heme/Lymph Last edited by Etheleen Mayhew, COT on 09/07/2023  8:21 AM.  ALLERGIES Allergies  Allergen Reactions   Shellfish Allergy Anaphylaxis   PAST MEDICAL HISTORY Past Medical History:  Diagnosis Date   Acute cerebrovascular accident (CVA) (HCC) 01/20/2020   Benign essential HTN 01/20/2020   Cataract    Mixed form OU   Dementia (HCC)    Diabetic retinopathy (HCC)    NPDR OU   Hypertensive retinopathy    OU   Seizures (HCC)    per pt- "none since teenage years"   Past Surgical History:  Procedure Laterality Date   LOOP RECORDER INSERTION N/A 01/23/2020   Procedure: LOOP RECORDER INSERTION;  Surgeon: Duke Salvia, MD;  Location: [facility]  INVASIVE CV LAB;  Service: Cardiovascular;  Laterality: N/A;   FAMILY HISTORY Family History  Problem Relation Age of Onset   COPD Mother    Diabetes Mother    Dementia Father    Diabetes Father    Cancer Father        prostate and lung   Colon cancer Neg Hx    Esophageal cancer Neg Hx    Stomach cancer Neg Hx    Rectal cancer Neg Hx    SOCIAL HISTORY Social History   Tobacco Use   Smoking status: Never   Smokeless tobacco: Never  Vaping Use   Vaping status: Never Used  Substance Use Topics   Alcohol use: Never   Drug use: Never       OPHTHALMIC EXAM: Base Eye Exam     Visual Acuity (Snellen - Linear)       Right Left   Dist [place] 20/70 -2 20/30 -1   Dist ph [place] NI NI         Tonometry (Applanation, 8:26 AM)       Right Left   Pressure 13 14         Pupils       Pupils Dark Light Shape React APD   Right PERRL 3 2 Round Brisk None   Left PERRL 3 2 Round Brisk None         Visual Fields       Left Right    Full Full         Extraocular Movement       Right Left    Full, Ortho Full, Ortho         Neuro/Psych     Oriented x3: Yes   Mood/Affect: Normal         Dilation     Both eyes: 2.5% Phenylephrine @ 8:25 AM           Slit Lamp and Fundus Exam     Slit Lamp Exam       Right Left   Lids/Lashes Dermatochalasis - upper lid, MGD, Ptosis Dermatochalasis - upper lid   Conjunctiva/Sclera Nasal and temporal Pinguecula, , Melanosis, 2+ Injection Nasal and temporal Pinguecula, mild Melanosis   Cornea Arcus, 1-2+ Punctate epithelial erosions, well healed cataract wound, no dendrites, pigmented KP inferiorly Arcus, 3+Punctate epithelial erosions, mild tear film debris   Anterior Chamber Deep, 1-2+cell and pigment Deep, 2+cell and pigment   Iris Round and dilated, No NVI Round and dilated, No NVI   Lens PC IOL in good position PC IOL in good postition   Anterior Vitreous Vitreous syneresis, fine pigment vs RBC Vitreous syneresis,  Posterior vitreous detachment, fine pigment         Fundus Exam       Right Left   Disc sharp rim, 2-3+pallor, +cupping Sharp,  mild Pallor   C/D Ratio 0.6 0.5   Macula Flat, good foveal reflex, scattered MA / DBH greatest superior mac Flat, good foveal reflex, scattered Microaneurysms/DBH, cystic changes IT macula, focal blot heme IN fovea   Vessels attenuated, mild tortuosity, focal flame heme along ST arcades attenuated, Tortuous, Telangiectasia   Periphery Attached, operculated hole at 1000 with partial pigment and +cuff of SRF -- good laser changes surrounding, scattered MA/DBH greatest posteriorly, White without pressure temporal periphery, good peripheral PRP changes 360, scattered DBH greatest superiorly Attached, scattered DBH, focal pigmented CR scar at 0130 equator, good peripheral 360 PRP           IMAGING AND PROCEDURES  Imaging and Procedures for @TODAY @  OCT, Retina - OU - Both Eyes       Right Eye Quality was borderline. Central Foveal Thickness: 265. Progression has worsened. Findings include normal foveal contour, no SRF, intraretinal fluid (mild diffuse retinal thinning -- stable, focal cystic changes nasal and temporal fovea -- slightly improved, +vitreous opacities).   Left Eye Quality was good. Central Foveal Thickness: 299. Progression has improved. Findings include normal foveal contour, no SRF, intraretinal hyper-reflective material, intraretinal fluid (Mild interval improvement in IRF / edema IT fovea and mac, +vitreous opacities).   Notes *Images captured and stored on drive  Diagnosis / Impression:  OD: NFP, no IRF / SRF -- mild diffuse retinal thinning -- stable, focal cystic changes nasal and temporal fovea -- slightly improved, +vitreous opacities -- new OS: NFP, no SRF, Mild interval improvement in IRF / edema IT fovea and mac, +vitreous opacities   Clinical management:  See below  Abbreviations: NFP - Normal foveal profile. CME - cystoid  macular edema. PED - pigment epithelial detachment. IRF - intraretinal fluid. SRF - subretinal fluid. EZ - ellipsoid zone. ERM - epiretinal membrane. ORA - outer retinal atrophy. ORT - outer retinal tubulation. SRHM - subretinal hyper-reflective material      Intravitreal Injection, Pharmacologic Agent - OD - Right Eye       Time Out 09/07/2023. 9:16 AM. Confirmed correct patient, procedure, site, and patient consented.   Anesthesia Topical anesthesia was used. Anesthetic medications included Lidocaine 2%, Proparacaine 0.5%.   Procedure Preparation included 5% betadine to ocular surface, eyelid speculum. A supplied needle was used.   Injection: 1.25 mg Bevacizumab 1.25mg /0.71ml   Route: Intravitreal, Site: Right Eye   NDC: P3213405, Lot: [id number], Expiration date: 09/21/2023   Post-op Post injection exam found visual acuity of at least counting fingers. The patient tolerated the procedure well. There were no complications. The patient received written and verbal post procedure care education.      Intravitreal Injection, Pharmacologic Agent - OS - Left Eye       Time Out 09/07/2023. 9:17 AM. Confirmed correct patient, procedure, site, and patient consented.   Anesthesia Topical anesthesia was used. Anesthetic medications included Lidocaine 2%, Proparacaine 0.5%.   Procedure Preparation included 5% betadine to ocular surface, eyelid speculum. A (32g) needle was used.   Injection: 2 mg aflibercept 2 MG/0.05ML   Route: Intravitreal, Site: Left Eye   NDC: L6038910, Lot: [id number], Expiration date: 11/20/2024, Waste: 0 mL   Post-op Post injection exam found visual acuity of at least counting fingers. The patient tolerated the procedure well. There were no complications. The patient received written and verbal post procedure care education.              ASSESSMENT/PLAN:    ICD-10-CM   1. Moderate nonproliferative diabetic retinopathy  of both eyes with macular  edema associated with type 2 diabetes mellitus (HCC)  E11.3313 OCT, Retina - OU - Both Eyes    Intravitreal Injection, Pharmacologic Agent - OD - Right Eye    Intravitreal Injection, Pharmacologic Agent - OS - Left Eye    aflibercept (EYLEA) SOLN 2 mg    Bevacizumab (AVASTIN) SOLN 1.25 mg    2. Diabetes mellitus treated with oral medication (HCC)  E11.9    Z79.84     3. Retinal hole of right eye  H33.321     4. Essential hypertension  I10     5. Hypertensive retinopathy of both eyes  H35.033     6. Pseudophakia  Z96.1     7. Dry eyes  H04.123     8. Herpes keratitis of right eye  B00.52       1,2. Moderate non-proliferative diabetic retinopathy, OU - s/p IVA OS #1 (05.07.21), #2 (06.04.21), #3 (07.06.21), #4 (08.03.21), #5 (08.31.21), #6 (9.28.21), #7 (11.2.21), #8 (12.07.21), #9 (02.02.22), #10 (03.30.22), #11 (05.25.22), #12 (08.03.22), #13 (10.20.22), #14 (01.12.23), #15 (01.22.24), #16 (03.11.24), #17 (03.11.24), #18 (04.22.24), #19 (06.03.24), #20 (07.15.24) - s/p IVE OS #1 (08.19.24)  - s/p PRP OS (04.09.21) -- good laser changes - s/p PRP OD (02.05.24) for peripheral vascular nonperfusion -- good early laser changes - repeat FA (09.28.21) shows late leaking MA OU, significant capillary drop-out  - repeat FA (01.23.24) shows OD: Scattered patches of vascular non-perfusion; large area of vascular non perfusion temporally, no NV; OS: delayed filling; scattered patches of vascular non perfusion peripherally, staining of 360 laser, scattered leaking MA -- most prominent IT to fovea, no NV  - exam shows scattered MA/IRH/CWS OU -- improving; new vitreous opacities OU  - BCVA OD 20/70 from 20/150, OS 20/30 from 20/40 - OCT shows OD: NFP, no IRF / SRF -- mild diffuse retinal thinning -- stable, focal cystic changes nasal and temporal fovea -- slightly improved, +vitreous opacities--new; OS: NFP, no SRF, Mild interval improvement in IRF / edema IT fovea and mac, +vitreous opacities -  recommend IVA OD #1 and IVE OS #2 today, 09.16.24 for DME and VH - pt wishes to proceed with injections - RBA of procedure discussed, questions answered - see procedure note - IVA informed consent obtained and signed OD (09.16.24) - IVE informed consent obtained and signed OS (08.19.24)  - f/u 2 weeks, DFE, OCT -- VH check  3. Operculated retinal hole w/ cuff of SRF / focal RD, right eye - operculated hole located at 1000 with partial pigment and +cuff of SRF / focal RD  - s/p retinopexy OD (03.17.21) -- good laser changes surrounding  - stable--monitor  4,5. Hypertensive retinopathy OU  - discussed importance of tight BP control  - monitor  6. Pseudophakia OD  - s/p CE/IOL (Dr. Laruth Bouchard, 07.02.24)  - IOL in good position, doing well  - monitor  7. Dry eyes OU  - recommend artificial tears and lubricating ointment as needed  8 Herpes Keratitis, OD  - exam today with improved injection; no dendrites; still with +AC cell -- slightly improved  - completed 14 days of Valtrex 1mg  PO BID - cont PF 6x/day OU for AC cell - f/u 2 weeks, DFE, OCT  Ophthalmic Meds Ordered this visit:  Meds ordered this encounter  Medications   aflibercept (EYLEA) SOLN 2 mg   Bevacizumab (AVASTIN) SOLN 1.25 mg     Return in about 2 weeks (around 09/21/2023) for f/u  NPDR OU, DFE, OCT.  There are no Patient Instructions on file for this visit.  Explained the diagnoses, plan, and follow up with the patient and they expressed understanding.  Patient expressed understanding of the importance of proper follow up care.   This document serves as a record of services personally performed by Karie Chimera, MD, PhD. It was created on their behalf by Annalee Genta, COMT. The creation of this record is the provider's dictation and/or activities during the visit.  Electronically signed by: Annalee Genta, COMT 09/07/23 5:31 PM  This document serves as a record of services personally performed by Karie Chimera,  MD, PhD. It was created on their behalf by Glee Arvin. Manson Passey, OA an ophthalmic technician. The creation of this record is the provider's dictation and/or activities during the visit.    Electronically signed by: Glee Arvin. Manson Passey, OA 09/07/23 5:31 PM  Karie Chimera, M.D., Ph.D. Diseases & Surgery of the Retina and Vitreous [facility]  I have reviewed the above documentation for accuracy and completeness, and I agree with the above. Karie Chimera, M.D., Ph.D. 09/07/23 5:34 PM   Abbreviations: M myopia (nearsighted); A astigmatism; H hyperopia (farsighted); P presbyopia; Mrx spectacle prescription;  CTL contact lenses; OD right eye; OS left eye; OU both eyes  XT exotropia; ET esotropia; PEK punctate epithelial keratitis; PEE punctate epithelial erosions; DES dry eye syndrome; MGD meibomian gland dysfunction; ATs artificial tears; PFAT's preservative free artificial tears; NSC nuclear sclerotic cataract; PSC posterior subcapsular cataract; ERM epi-retinal membrane; PVD posterior vitreous detachment; RD retinal detachment; DM diabetes mellitus; DR diabetic retinopathy; NPDR non-proliferative diabetic retinopathy; PDR proliferative diabetic retinopathy; CSME clinically significant macular edema; DME diabetic macular edema; dbh dot blot hemorrhages; CWS cotton wool spot; POAG primary open angle glaucoma; C/D cup-to-disc ratio; HVF humphrey visual field; GVF goldmann visual field; OCT optical coherence tomography; IOP intraocular pressure; BRVO Branch retinal vein occlusion; CRVO central retinal vein occlusion; CRAO central retinal artery occlusion; BRAO branch retinal artery occlusion; RT retinal tear; SB scleral buckle; PPV pars plana vitrectomy; VH Vitreous hemorrhage; PRP panretinal laser photocoagulation; IVK intravitreal kenalog; VMT vitreomacular traction; MH Macular hole;  NVD neovascularization of the disc; NVE neovascularization elsewhere; AREDS age related eye disease study;  ARMD age related macular degeneration; POAG primary open angle glaucoma; EBMD epithelial/anterior basement membrane dystrophy; ACIOL anterior chamber intraocular lens; IOL intraocular lens; PCIOL posterior chamber intraocular lens; Phaco/IOL phacoemulsification with intraocular lens placement; PRK photorefractive keratectomy; LASIK laser assisted in situ keratomileusis; HTN hypertension; DM diabetes mellitus; COPD chronic obstructive pulmonary disease

## 2023-09-03 NOTE — Progress Notes (Signed)
[facility] Summary Report / Loop Recorder 

## 2023-09-07 ENCOUNTER — Encounter (INDEPENDENT_AMBULATORY_CARE_PROVIDER_SITE_OTHER): Payer: Self-pay | Admitting: Ophthalmology

## 2023-09-07 ENCOUNTER — Ambulatory Visit (INDEPENDENT_AMBULATORY_CARE_PROVIDER_SITE_OTHER): Payer: Medicare HMO | Admitting: Ophthalmology

## 2023-09-07 DIAGNOSIS — Z961 Presence of intraocular lens: Secondary | ICD-10-CM

## 2023-09-07 DIAGNOSIS — E113313 Type 2 diabetes mellitus with moderate nonproliferative diabetic retinopathy with macular edema, bilateral: Secondary | ICD-10-CM | POA: Diagnosis not present

## 2023-09-07 DIAGNOSIS — Z7984 Long term (current) use of oral hypoglycemic drugs: Secondary | ICD-10-CM

## 2023-09-07 DIAGNOSIS — I10 Essential (primary) hypertension: Secondary | ICD-10-CM

## 2023-09-07 DIAGNOSIS — H35033 Hypertensive retinopathy, bilateral: Secondary | ICD-10-CM | POA: Diagnosis not present

## 2023-09-07 DIAGNOSIS — B0052 Herpesviral keratitis: Secondary | ICD-10-CM

## 2023-09-07 DIAGNOSIS — E119 Type 2 diabetes mellitus without complications: Secondary | ICD-10-CM

## 2023-09-07 DIAGNOSIS — H33321 Round hole, right eye: Secondary | ICD-10-CM

## 2023-09-07 DIAGNOSIS — H04123 Dry eye syndrome of bilateral lacrimal glands: Secondary | ICD-10-CM

## 2023-09-07 MED ORDER — BEVACIZUMAB CHEMO INJECTION 1.25MG/0.05ML SYRINGE FOR KALEIDOSCOPE
1.2500 mg | INTRAVITREAL | Status: AC | PRN
Start: 2023-09-07 — End: 2023-09-07
  Administered 2023-09-07: 1.25 mg via INTRAVITREAL

## 2023-09-07 MED ORDER — AFLIBERCEPT 2MG/0.05ML IZ SOLN FOR KALEIDOSCOPE
2.0000 mg | INTRAVITREAL | Status: AC | PRN
Start: 2023-09-07 — End: 2023-09-07
  Administered 2023-09-07: 2 mg via INTRAVITREAL
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
## 2023-09-15 NOTE — Progress Notes (Signed)
[facility] - Clinic Note  09/21/2023     CHIEF COMPLAINT Patient presents for Retina Follow Up   HISTORY OF PRESENT ILLNESS: [name] is a 72 y.o. male who presents to the clinic today for:   HPI     Retina Follow Up   Patient presents with  Diabetic Retinopathy.  In both eyes.  This started months ago.  Duration of 2 weeks.  Since onset it is stable.  I, the attending physician,  performed the HPI with the patient and updated documentation appropriately.        Comments   Patient states the vision is the same and he is seeing well. He is using Latanoprost OU at bedtime, Pred OU BID, and AT's PRN. His blood sugar was 108.      Last edited by Rennis Chris, MD on 09/23/2023 11:20 PM.     Patient feels like his vision is the same as last time, he is only using PF BID instead of 6x/day  Referring physician: Raymon Mutton., FNP [address] [place],  [place] [address]  HISTORICAL INFORMATION:   Selected notes from the MEDICAL RECORD NUMBER Referred by Dr. Fabian Sharp for concern of HTN Ret   CURRENT MEDICATIONS: Current Outpatient Medications (Ophthalmic Drugs)  Medication Sig   latanoprost (XALATAN) 0.005 % ophthalmic solution SMARTSIG:In Eye(s)   Polyvinyl Alcohol-Povidone (REFRESH OP) Place 1 drop into both eyes in the morning and at bedtime.   prednisoLONE acetate (PRED FORTE) 1 % ophthalmic suspension Place 1 drop into both eyes 6 (six) times daily.   No current facility-administered medications for this visit. (Ophthalmic Drugs)   Current Outpatient Medications (Other)  Medication Sig   aspirin 325 MG EC tablet aspirin 325 mg tablet,delayed release   atorvastatin (LIPITOR) 40 MG tablet Take 1 tablet (40 mg total) by mouth daily at 6 PM.   carvedilol (COREG) 6.25 MG tablet Take 6.25 mg by mouth 2 (two) times daily with a meal.   donepezil (ARICEPT) 10 MG tablet Take 1 tablet (10 mg total) by mouth at bedtime.   hydrochlorothiazide  (HYDRODIURIL) 25 MG tablet Take 25 mg by mouth daily.   losartan (COZAAR) 100 MG tablet Take 100 mg by mouth daily.   memantine (NAMENDA) 10 MG tablet Take 1 tablet (10 mg total) by mouth 2 (two) times daily.   metFORMIN (GLUCOPHAGE) 500 MG tablet Take by mouth 2 (two) times daily with a meal.   Multiple Vitamins-Minerals (ONE-A-DAY MENS 50+ PO) Take 1 tablet by mouth daily.   PFIZER-BIONT COVID-19 VAC-TRIS SUSP injection    PFIZER-BIONTECH COVID-19 VACC 30 MCG/0.3ML injection    traZODone (DESYREL) 150 MG tablet Take 1 tablet (150 mg total) by mouth at bedtime.   furosemide (LASIX) 40 MG tablet Take 1 tablet (40 mg total) by mouth 2 (two) times daily for 4 days, THEN 1 tablet (40 mg total) daily.   Potassium Chloride ER 20 MEQ TBCR Take 1 tablet (20 mEq total) by mouth in the morning and at bedtime for 4 days, THEN 1 tablet (20 mEq total) daily.   Current Facility-Administered Medications (Other)  Medication Route   0.9 %  sodium chloride infusion Intravenous   0.9 %  sodium chloride infusion Intravenous   REVIEW OF SYSTEMS: ROS   Positive for: Endocrine, Eyes Negative for: Constitutional, Gastrointestinal, Neurological, Skin, Genitourinary, Musculoskeletal, HENT, Cardiovascular, Respiratory, Psychiatric, Allergic/Imm, Heme/Lymph Last edited by Charlette Caffey, COT on 09/21/2023  8:36 AM.  ALLERGIES Allergies  Allergen Reactions   Shellfish Allergy Anaphylaxis   PAST MEDICAL HISTORY Past Medical History:  Diagnosis Date   Acute cerebrovascular accident (CVA) (HCC) 01/20/2020   Benign essential HTN 01/20/2020   Cataract    Mixed form OU   Dementia (HCC)    Diabetic retinopathy (HCC)    NPDR OU   Hypertensive retinopathy    OU   Seizures (HCC)    per pt- "none since teenage years"   Past Surgical History:  Procedure Laterality Date   LOOP RECORDER INSERTION N/A 01/23/2020   Procedure: LOOP RECORDER INSERTION;  Surgeon: Duke Salvia, MD;  Location: [facility] INVASIVE CV  LAB;  Service: Cardiovascular;  Laterality: N/A;   FAMILY HISTORY Family History  Problem Relation Age of Onset   COPD Mother    Diabetes Mother    Dementia Father    Diabetes Father    Cancer Father        prostate and lung   Colon cancer Neg Hx    Esophageal cancer Neg Hx    Stomach cancer Neg Hx    Rectal cancer Neg Hx    SOCIAL HISTORY Social History   Tobacco Use   Smoking status: Never   Smokeless tobacco: Never  Vaping Use   Vaping status: Never Used  Substance Use Topics   Alcohol use: Never   Drug use: Never       OPHTHALMIC EXAM: Base Eye Exam     Visual Acuity (Snellen - Linear)       Right Left   Dist [place] 20/70 20/30   Dist ph [place] NI NI         Tonometry (Tonopen, 8:40 AM)       Right Left   Pressure 14 14         Pupils       Dark Light Shape React APD   Right 3 2 Round Brisk None   Left 3 2 Round Brisk None         Visual Fields       Left Right    Full Full         Extraocular Movement       Right Left    Full, Ortho Full, Ortho         Neuro/Psych     Oriented x3: Yes   Mood/Affect: Normal         Dilation     Both eyes: 1.0% Mydriacyl, 2.5% Phenylephrine @ 8:37 AM           Slit Lamp and Fundus Exam     Slit Lamp Exam       Right Left   Lids/Lashes Dermatochalasis - upper lid, MGD, Ptosis Dermatochalasis - upper lid   Conjunctiva/Sclera Nasal and temporal Pinguecula, Melanosis Nasal and temporal Pinguecula, mild Melanosis   Cornea Arcus, well healed cataract wound, no dendrites, pigmented KP inferiorly Arcus, 2-3+Punctate epithelial erosions, well healed cataract wound   Anterior Chamber Deep, 1-2+cell and pigment Deep, 1-2+cell and pigment   Iris Round and dilated, No NVI Round and dilated, No NVI   Lens PC IOL in good position PC IOL in good postition   Anterior Vitreous Vitreous syneresis, fine pigment vs RBC Vitreous syneresis, Posterior vitreous detachment, fine pigment         Fundus Exam        Right Left   Disc sharp rim, 2-3+pallor, +cupping Sharp, mild Pallor   C/D Ratio 0.6 0.5  Macula Flat, good foveal reflex, scattered MA / DBH greatest superior mac Flat, good foveal reflex, scattered Microaneurysms/DBH, cystic changes IT macula -- improved, focal blot heme IN fovea -- improved   Vessels attenuated, mild tortuosity, focal flame heme along ST arcades attenuated, Tortuous, Telangiectasia   Periphery Attached, operculated hole at 1000 with partial pigment and +cuff of SRF -- good laser changes surrounding, scattered MA/DBH greatest posteriorly, White without pressure temporal periphery, good peripheral PRP changes 360, scattered DBH greatest superiorly Attached, scattered DBH, focal pigmented CR scar at 0130 equator, good peripheral 360 PRP           IMAGING AND PROCEDURES  Imaging and Procedures for @TODAY @  OCT, Retina - OU - Both Eyes       Right Eye Quality was borderline. Central Foveal Thickness: 258. Progression has improved. Findings include normal foveal contour, no IRF, no SRF (mild diffuse retinal thinning -- stable, interval improvement in IRF / cystic changes nasal and temporal fovea and vitreous opacities).   Left Eye Quality was poor. Central Foveal Thickness: 299. Progression has improved. Findings include normal foveal contour, no SRF, intraretinal hyper-reflective material, intraretinal fluid (interval improvement in IRF / IRHM temporal fovea and mac, +vitreous opacities).   Notes *Images captured and stored on drive  Diagnosis / Impression:  OD: NFP, no IRF / SRF -- mild diffuse retinal thinning -- stable, interval improvement in IRF / cystic changes nasal and temporal fovea and vitreous opacities OS: NFP, no SRF, interval improvement in IRF / IRHM temporal fovea and mac, +vitreous opacities   Clinical management:  See below  Abbreviations: NFP - Normal foveal profile. CME - cystoid macular edema. PED - pigment epithelial detachment. IRF -  intraretinal fluid. SRF - subretinal fluid. EZ - ellipsoid zone. ERM - epiretinal membrane. ORA - outer retinal atrophy. ORT - outer retinal tubulation. SRHM - subretinal hyper-reflective material            ASSESSMENT/PLAN:    ICD-10-CM   1. Moderate nonproliferative diabetic retinopathy of both eyes with macular edema associated with type 2 diabetes mellitus (HCC)  E11.3313 OCT, Retina - OU - Both Eyes    2. Diabetes mellitus treated with oral medication (HCC)  E11.9    Z79.84     3. Retinal hole of right eye  H33.321     4. Essential hypertension  I10     5. Hypertensive retinopathy of both eyes  H35.033     6. Pseudophakia  Z96.1     7. Dry eyes  H04.123     8. Herpes keratitis of right eye  B00.52      1,2. Moderate non-proliferative diabetic retinopathy, OU  - s/p IVA OD #1 (09.16.24) - s/p IVA OS #1 (05.07.21), #2 (06.04.21), #3 (07.06.21), #4 (08.03.21), #5 (08.31.21), #6 (9.28.21), #7 (11.2.21), #8 (12.07.21), #9 (02.02.22), #10 (03.30.22), #11 (05.25.22), #12 (08.03.22), #13 (10.20.22), #14 (01.12.23), #15 (01.22.24), #16 (03.11.24), #17 (03.11.24), #18 (04.22.24), #19 (06.03.24), #20 (07.15.24) - s/p IVE OS #1 (08.19.24), #2 (09.16.24)  - s/p PRP OS (04.09.21) -- good laser changes - s/p PRP OD (02.05.24) for peripheral vascular nonperfusion -- good laser changes - repeat FA (09.28.21) shows late leaking MA OU, significant capillary drop-out  - repeat FA (01.23.24) shows OD: Scattered patches of vascular non-perfusion; large area of vascular non perfusion temporally, no NV; OS: delayed filling; scattered patches of vascular non perfusion peripherally, staining of 360 laser, scattered leaking MA -- most prominent IT to fovea, no NV  -  exam shows scattered MA/IRH/CWS OU -- improving; new vitreous opacities OU  - BCVA OD stable at 20/70, OS stable at 20/30  - OCT shows OD: NFP, no IRF / SRF -- mild diffuse retinal thinning -- stable, interval improvement in IRF / cystic  changes nasal and temporal fovea and vitreous opacities; OS: NFP, no SRF, interval improvement in IRF / IRHM temporal fovea and mac, +vitreous opacities - IVA informed consent obtained and signed OD (09.16.24) - IVE informed consent obtained and signed OS (08.19.24)  - f/u October 14th or later, DFE, OCT -- [facility] check  3. Operculated retinal hole w/ cuff of SRF / focal RD, right eye - operculated hole located at 1000 with partial pigment and +cuff of SRF / focal RD  - s/p retinopexy OD (03.17.21) -- good laser changes surrounding  - stable--monitor  4,5. Hypertensive retinopathy OU  - discussed importance of tight BP control  - monitor  6. Pseudophakia OD  - s/p CE/IOL (Dr. Laruth Bouchard, 07.02.24)  - IOL in good position, doing well  - monitor  7. Dry eyes OU  - recommend artificial tears and lubricating ointment as needed  8 Herpes Keratitis, OD  - exam today with improved injection; no dendrites; still with +AC cell  - completed 14 days of Valtrex 1mg  PO BID - cont PF 6x/day OU for AC cell -- pt is only using BID - f/u 2 weeks DFE, OCT  Ophthalmic Meds Ordered this visit:  No orders of the defined types were placed in this encounter.    Return for f/u October 14th or later, NPDR OU, DFE, OCT.  There are no Patient Instructions on file for this visit.  Explained the diagnoses, plan, and follow up with the patient and they expressed understanding.  Patient expressed understanding of the importance of proper follow up care.   This document serves as a record of services personally performed by Karie Chimera, MD, PhD. It was created on their behalf by Annalee Genta, COMT. The creation of this record is the provider's dictation and/or activities during the visit.  Electronically signed by: Annalee Genta, COMT 09/23/23 11:21 PM  This document serves as a record of services personally performed by Karie Chimera, MD, PhD. It was created on their behalf by Glee Arvin. Manson Passey, OA an  ophthalmic technician. The creation of this record is the provider's dictation and/or activities during the visit.    Electronically signed by: Glee Arvin. Manson Passey, OA 09/23/23 11:21 PM  Karie Chimera, M.D., Ph.D. Diseases & Surgery of the Retina and Vitreous [facility]  I have reviewed the above documentation for accuracy and completeness, and I agree with the above. Karie Chimera, M.D., Ph.D. 09/23/23 11:22 PM   Abbreviations: M myopia (nearsighted); A astigmatism; H hyperopia (farsighted); P presbyopia; Mrx spectacle prescription;  CTL contact lenses; OD right eye; OS left eye; OU both eyes  XT exotropia; ET esotropia; PEK punctate epithelial keratitis; PEE punctate epithelial erosions; DES dry eye syndrome; MGD meibomian gland dysfunction; ATs artificial tears; PFAT's preservative free artificial tears; NSC nuclear sclerotic cataract; PSC posterior subcapsular cataract; ERM epi-retinal membrane; PVD posterior vitreous detachment; RD retinal detachment; DM diabetes mellitus; DR diabetic retinopathy; NPDR non-proliferative diabetic retinopathy; PDR proliferative diabetic retinopathy; CSME clinically significant macular edema; DME diabetic macular edema; dbh dot blot hemorrhages; CWS cotton wool spot; POAG primary open angle glaucoma; C/D cup-to-disc ratio; HVF humphrey visual field; GVF goldmann visual field; OCT optical coherence tomography; IOP  intraocular pressure; BRVO Branch retinal vein occlusion; CRVO central retinal vein occlusion; CRAO central retinal artery occlusion; BRAO branch retinal artery occlusion; RT retinal tear; SB scleral buckle; PPV pars plana vitrectomy; VH Vitreous hemorrhage; PRP panretinal laser photocoagulation; IVK intravitreal kenalog; VMT vitreomacular traction; MH Macular hole;  NVD neovascularization of the disc; NVE neovascularization elsewhere; AREDS age related eye disease study; ARMD age related macular degeneration; POAG primary open angle  glaucoma; EBMD epithelial/anterior basement membrane dystrophy; ACIOL anterior chamber intraocular lens; IOL intraocular lens; PCIOL posterior chamber intraocular lens; Phaco/IOL phacoemulsification with intraocular lens placement; PRK photorefractive keratectomy; LASIK laser assisted in situ keratomileusis; HTN hypertension; DM diabetes mellitus; COPD chronic obstructive pulmonary disease

## 2023-09-16 ENCOUNTER — Encounter: Payer: Self-pay | Admitting: Adult Health

## 2023-09-16 ENCOUNTER — Ambulatory Visit: Payer: Medicare HMO | Admitting: Adult Health

## 2023-09-16 VITALS — BP 148/68 | HR 74 | Ht 67.0 in | Wt 187.0 lb

## 2023-09-16 DIAGNOSIS — F01B11 Vascular dementia, moderate, with agitation: Secondary | ICD-10-CM

## 2023-09-16 DIAGNOSIS — I639 Cerebral infarction, unspecified: Secondary | ICD-10-CM

## 2023-09-16 MED ORDER — MEMANTINE HCL 10 MG PO TABS: 10.0000 mg | ORAL_TABLET | Freq: Two times a day (BID) | ORAL | 3 refills | Status: DC

## 2023-09-16 MED ORDER — TRAZODONE HCL 150 MG PO TABS: 150.0000 mg | ORAL_TABLET | Freq: Every day | ORAL | 5 refills | Status: DC

## 2023-09-16 MED ORDER — DONEPEZIL HCL 10 MG PO TABS: 10.0000 mg | ORAL_TABLET | Freq: Every day | ORAL | 3 refills | Status: DC

## 2023-09-16 NOTE — Patient Instructions (Signed)
Your Plan:  Increase trazodone to 150mg  nightly - please let me know if any difficulty tolerating or if agitation still persists  Continue Aricept and Namenda at current dosages  Continue to keep active with routine physical and mental activities, ensure good sleep and healthy diet    Follow up in 6 months or call earlier if needed     Thank you for coming to see [place] at [facility]. I hope we have been able to provide you high quality care today.  You may receive a patient satisfaction survey over the next few weeks. We would appreciate your feedback and comments so that we may continue to improve ourselves and the health of our patients.

## 2023-09-21 ENCOUNTER — Ambulatory Visit (INDEPENDENT_AMBULATORY_CARE_PROVIDER_SITE_OTHER): Payer: Medicare HMO | Admitting: Ophthalmology

## 2023-09-21 ENCOUNTER — Encounter (INDEPENDENT_AMBULATORY_CARE_PROVIDER_SITE_OTHER): Payer: Self-pay | Admitting: Ophthalmology

## 2023-09-21 DIAGNOSIS — H35033 Hypertensive retinopathy, bilateral: Secondary | ICD-10-CM

## 2023-09-21 DIAGNOSIS — I10 Essential (primary) hypertension: Secondary | ICD-10-CM | POA: Diagnosis not present

## 2023-09-21 DIAGNOSIS — Z7984 Long term (current) use of oral hypoglycemic drugs: Secondary | ICD-10-CM

## 2023-09-21 DIAGNOSIS — E113313 Type 2 diabetes mellitus with moderate nonproliferative diabetic retinopathy with macular edema, bilateral: Secondary | ICD-10-CM

## 2023-09-21 DIAGNOSIS — Z961 Presence of intraocular lens: Secondary | ICD-10-CM

## 2023-09-21 DIAGNOSIS — E119 Type 2 diabetes mellitus without complications: Secondary | ICD-10-CM

## 2023-09-21 DIAGNOSIS — H33321 Round hole, right eye: Secondary | ICD-10-CM | POA: Diagnosis not present

## 2023-09-21 DIAGNOSIS — H04123 Dry eye syndrome of bilateral lacrimal glands: Secondary | ICD-10-CM

## 2023-09-21 DIAGNOSIS — B0052 Herpesviral keratitis: Secondary | ICD-10-CM

## 2023-09-23 ENCOUNTER — Encounter (INDEPENDENT_AMBULATORY_CARE_PROVIDER_SITE_OTHER): Payer: Self-pay | Admitting: Ophthalmology

## 2023-09-23 LAB — CUP PACEART REMOTE DEVICE CHECK
Date Time Interrogation Session: 20240930231848
Implantable Pulse Generator Implant Date: 20210201

## 2023-09-28 ENCOUNTER — Ambulatory Visit: Payer: Medicare HMO

## 2023-09-28 DIAGNOSIS — I639 Cerebral infarction, unspecified: Secondary | ICD-10-CM

## 2023-10-05 ENCOUNTER — Encounter: Payer: Self-pay | Admitting: Adult Health

## 2023-10-05 ENCOUNTER — Encounter (INDEPENDENT_AMBULATORY_CARE_PROVIDER_SITE_OTHER): Payer: Medicare HMO | Admitting: Ophthalmology

## 2023-10-05 DIAGNOSIS — Z961 Presence of intraocular lens: Secondary | ICD-10-CM

## 2023-10-05 DIAGNOSIS — I10 Essential (primary) hypertension: Secondary | ICD-10-CM

## 2023-10-05 DIAGNOSIS — E113313 Type 2 diabetes mellitus with moderate nonproliferative diabetic retinopathy with macular edema, bilateral: Secondary | ICD-10-CM

## 2023-10-05 DIAGNOSIS — B0052 Herpesviral keratitis: Secondary | ICD-10-CM

## 2023-10-05 DIAGNOSIS — H33321 Round hole, right eye: Secondary | ICD-10-CM

## 2023-10-05 DIAGNOSIS — E119 Type 2 diabetes mellitus without complications: Secondary | ICD-10-CM

## 2023-10-05 DIAGNOSIS — H35033 Hypertensive retinopathy, bilateral: Secondary | ICD-10-CM

## 2023-10-05 DIAGNOSIS — H04123 Dry eye syndrome of bilateral lacrimal glands: Secondary | ICD-10-CM

## 2023-10-09 ENCOUNTER — Encounter (INDEPENDENT_AMBULATORY_CARE_PROVIDER_SITE_OTHER): Payer: Medicare HMO | Admitting: Ophthalmology

## 2023-10-09 DIAGNOSIS — H35033 Hypertensive retinopathy, bilateral: Secondary | ICD-10-CM

## 2023-10-09 DIAGNOSIS — I10 Essential (primary) hypertension: Secondary | ICD-10-CM

## 2023-10-09 DIAGNOSIS — H04123 Dry eye syndrome of bilateral lacrimal glands: Secondary | ICD-10-CM

## 2023-10-09 DIAGNOSIS — E113313 Type 2 diabetes mellitus with moderate nonproliferative diabetic retinopathy with macular edema, bilateral: Secondary | ICD-10-CM

## 2023-10-09 DIAGNOSIS — B0052 Herpesviral keratitis: Secondary | ICD-10-CM

## 2023-10-09 DIAGNOSIS — E119 Type 2 diabetes mellitus without complications: Secondary | ICD-10-CM

## 2023-10-09 DIAGNOSIS — H33321 Round hole, right eye: Secondary | ICD-10-CM

## 2023-10-09 DIAGNOSIS — Z961 Presence of intraocular lens: Secondary | ICD-10-CM

## 2023-10-13 NOTE — Progress Notes (Signed)
[facility] Summary Report / Loop Recorder 

## 2023-10-26 ENCOUNTER — Telehealth: Payer: Self-pay | Admitting: Internal Medicine

## 2023-10-26 NOTE — Telephone Encounter (Signed)
Patient's daughter is returning a call to Marchelle Folks to sch a CT CHEST WO CONTRAST. Please advise.

## 2023-10-28 LAB — CUP PACEART REMOTE DEVICE CHECK
Date Time Interrogation Session: 20241102230020
Implantable Pulse Generator Implant Date: 20210201

## 2023-11-02 ENCOUNTER — Ambulatory Visit (INDEPENDENT_AMBULATORY_CARE_PROVIDER_SITE_OTHER): Payer: Medicare HMO

## 2023-11-02 DIAGNOSIS — I639 Cerebral infarction, unspecified: Secondary | ICD-10-CM | POA: Diagnosis not present

## 2023-11-05 NOTE — Progress Notes (Signed)
[facility] - Clinic Note  11/11/2023     CHIEF COMPLAINT Patient presents for Retina Follow Up   HISTORY OF PRESENT ILLNESS: [name] is a 72 y.o. male who presents to the clinic today for:   HPI     Retina Follow Up   Patient presents with  Diabetic Retinopathy.  In both eyes.  Severity is moderate.  Duration of 7 weeks.  Since onset it is stable.  I, the attending physician,  performed the HPI with the patient and updated documentation appropriately.        Comments   Pt here for 7 wk ret f/u NDPR OU. Pt states VA stable, no changes. Did not check BS this am. Pt using PF QID OD and latanoprost at bedtime OU.       Last edited by Rennis Chris, MD on 11/11/2023 12:52 PM.    Delayed follow up from 7 weeks to 9 weeks, pt states vision is stable, he saw Dr. Dione Booze last Monday and he reduced his PF to QID, he is also using latanoprost at night   Referring physician: Raymon Mutton., FNP [address] [place],  [place] [address]  HISTORICAL INFORMATION:   Selected notes from the MEDICAL RECORD NUMBER Referred by Dr. Fabian Sharp for concern of HTN Ret   CURRENT MEDICATIONS: Current Outpatient Medications (Ophthalmic Drugs)  Medication Sig   latanoprost (XALATAN) 0.005 % ophthalmic solution SMARTSIG:In Eye(s)   Polyvinyl Alcohol-Povidone (REFRESH OP) Place 1 drop into both eyes in the morning and at bedtime.   prednisoLONE acetate (PRED FORTE) 1 % ophthalmic suspension Place 1 drop into both eyes 6 (six) times daily. (Patient taking differently: Place 1 drop into both eyes 4 (four) times daily.)   No current facility-administered medications for this visit. (Ophthalmic Drugs)   Current Outpatient Medications (Other)  Medication Sig   aspirin 325 MG EC tablet aspirin 325 mg tablet,delayed release   atorvastatin (LIPITOR) 40 MG tablet Take 1 tablet (40 mg total) by mouth daily at 6 PM.   carvedilol (COREG) 6.25 MG tablet Take 6.25 mg by mouth 2  (two) times daily with a meal.   donepezil (ARICEPT) 10 MG tablet Take 1 tablet (10 mg total) by mouth at bedtime.   hydrochlorothiazide (HYDRODIURIL) 25 MG tablet Take 25 mg by mouth daily.   losartan (COZAAR) 100 MG tablet Take 100 mg by mouth daily.   memantine (NAMENDA) 10 MG tablet Take 1 tablet (10 mg total) by mouth 2 (two) times daily.   metFORMIN (GLUCOPHAGE) 500 MG tablet Take by mouth 2 (two) times daily with a meal.   Multiple Vitamins-Minerals (ONE-A-DAY MENS 50+ PO) Take 1 tablet by mouth daily.   PFIZER-BIONT COVID-19 VAC-TRIS SUSP injection    PFIZER-BIONTECH COVID-19 VACC 30 MCG/0.3ML injection    traZODone (DESYREL) 150 MG tablet Take 1 tablet (150 mg total) by mouth at bedtime.   furosemide (LASIX) 40 MG tablet Take 1 tablet (40 mg total) by mouth 2 (two) times daily for 4 days, THEN 1 tablet (40 mg total) daily.   Potassium Chloride ER 20 MEQ TBCR Take 1 tablet (20 mEq total) by mouth in the morning and at bedtime for 4 days, THEN 1 tablet (20 mEq total) daily.   Current Facility-Administered Medications (Other)  Medication Route   0.9 %  sodium chloride infusion Intravenous   0.9 %  sodium chloride infusion Intravenous   REVIEW OF SYSTEMS: ROS   Positive for: Endocrine, Eyes Negative  for: Constitutional, Gastrointestinal, Neurological, Skin, Genitourinary, Musculoskeletal, HENT, Cardiovascular, Respiratory, Psychiatric, Allergic/Imm, Heme/Lymph Last edited by Thompson Grayer, COT on 11/11/2023  8:11 AM.       ALLERGIES Allergies  Allergen Reactions   Shellfish Allergy Anaphylaxis   PAST MEDICAL HISTORY Past Medical History:  Diagnosis Date   Acute cerebrovascular accident (CVA) (HCC) 01/20/2020   Benign essential HTN 01/20/2020   Cataract    Mixed form OU   Dementia (HCC)    Diabetic retinopathy (HCC)    NPDR OU   Hypertensive retinopathy    OU   Seizures (HCC)    per pt- "none since teenage years"   Past Surgical History:  Procedure Laterality  Date   LOOP RECORDER INSERTION N/A 01/23/2020   Procedure: LOOP RECORDER INSERTION;  Surgeon: Duke Salvia, MD;  Location: [facility] INVASIVE CV LAB;  Service: Cardiovascular;  Laterality: N/A;   FAMILY HISTORY Family History  Problem Relation Age of Onset   COPD Mother    Diabetes Mother    Dementia Father    Diabetes Father    Cancer Father        prostate and lung   Colon cancer Neg Hx    Esophageal cancer Neg Hx    Stomach cancer Neg Hx    Rectal cancer Neg Hx    SOCIAL HISTORY Social History   Tobacco Use   Smoking status: Never   Smokeless tobacco: Never  Vaping Use   Vaping status: Never Used  Substance Use Topics   Alcohol use: Never   Drug use: Never       OPHTHALMIC EXAM: Base Eye Exam     Visual Acuity (Snellen - Linear)       Right Left   Dist [place] 20/40 20/30 +2   Dist ph [place] NI NI         Tonometry (Tonopen, 8:19 AM)       Right Left   Pressure 13 11         Pupils       Pupils Dark Light Shape React APD   Right PERRL 3 2 Round Brisk None   Left PERRL 3 2 Round Brisk None         Visual Fields (Counting fingers)       Left Right    Full Full         Extraocular Movement       Right Left    Full, Ortho Full, Ortho         Neuro/Psych     Oriented x3: Yes   Mood/Affect: Normal         Dilation     Both eyes: 1.0% Mydriacyl, 2.5% Phenylephrine @ 8:19 AM           Slit Lamp and Fundus Exam     Slit Lamp Exam       Right Left   Lids/Lashes Dermatochalasis - upper lid, MGD, Ptosis Dermatochalasis - upper lid   Conjunctiva/Sclera Nasal and temporal Pinguecula, Melanosis Nasal and temporal Pinguecula, mild Melanosis   Cornea Arcus, well healed cataract wound, no dendrites, pigmented KP inferiorly, 1+ fine Punctate epithelial erosions Arcus, 2+fine Punctate epithelial erosions, well healed cataract wound   Anterior Chamber deep and clear, no cell or flare deep and clear, no cell or flare   Iris Round and dilated, No  NVI Round and dilated, No NVI   Lens PC IOL in good position PC IOL in good postition   Anterior Vitreous  Vitreous syneresis, vitreous condensations clearing Vitreous syneresis, Posterior vitreous detachment, fine pigment         Fundus Exam       Right Left   Disc sharp rim, 2-3+pallor, +cupping Sharp, mild Pallor   C/D Ratio 0.6 0.5   Macula Flat, good foveal reflex, scattered MA / DBH greatest superior mac Flat, good foveal reflex, scattered Microaneurysms/DBH, cystic changes IT macula -- improved   Vessels attenuated, tortuous, focal flame heme along ST arcades -- resolved attenuated, Tortuous, Telangiectasia   Periphery Attached, operculated hole at 1000 with partial pigment and +cuff of SRF -- good laser changes surrounding, scattered MA/DBH greatest posteriorly, White without pressure temporal periphery, good peripheral PRP changes 360, scattered DBH greatest superiorly Attached, scattered DBH, focal pigmented CR scar at 0130 equator, good peripheral 360 PRP           IMAGING AND PROCEDURES  Imaging and Procedures for @TODAY @  OCT, Retina - OU - Both Eyes       Right Eye Quality was borderline. Central Foveal Thickness: 252. Progression has improved. Findings include normal foveal contour, no IRF, no SRF (mild diffuse retinal thinning -- stable, trace cystic changes nasal and temporal fovea, interval improvement in vitreous opacities).   Left Eye Quality was poor. Central Foveal Thickness: 236. Progression has improved. Findings include normal foveal contour, no SRF, intraretinal hyper-reflective material, intraretinal fluid (Mild interval improvement in IRF / IRHM temporal fovea and mac, +vitreous opacities -- improved).   Notes *Images captured and stored on drive  Diagnosis / Impression:  OD: NFP, no IRF / SRF -- mild diffuse retinal thinning -- stable, trace cystic changes nasal and temporal fovea, interval improvement in vitreous opacities OS: NFP, no SRF, mild  interval improvement in IRF / IRHM temporal fovea and mac, +vitreous opacities -- improved   Clinical management:  See below  Abbreviations: NFP - Normal foveal profile. CME - cystoid macular edema. PED - pigment epithelial detachment. IRF - intraretinal fluid. SRF - subretinal fluid. EZ - ellipsoid zone. ERM - epiretinal membrane. ORA - outer retinal atrophy. ORT - outer retinal tubulation. SRHM - subretinal hyper-reflective material      Intravitreal Injection, Pharmacologic Agent - OD - Right Eye       Time Out 11/11/2023. 8:51 AM. Confirmed correct patient, procedure, site, and patient consented.   Anesthesia Topical anesthesia was used. Anesthetic medications included Lidocaine 2%, Proparacaine 0.5%.   Procedure Preparation included 5% betadine to ocular surface, eyelid speculum. A (32g) needle was used.   Injection: 1.25 mg Bevacizumab 1.25mg /0.26ml   Route: Intravitreal, Site: Right Eye   NDC: P3213405, Lot: [id number], Expiration date: 03/07/2024   Post-op Post injection exam found visual acuity of at least counting fingers. The patient tolerated the procedure well. There were no complications. The patient received written and verbal post procedure care education.      Intravitreal Injection, Pharmacologic Agent - OS - Left Eye       Time Out 11/11/2023. 8:59 AM. Confirmed correct patient, procedure, site, and patient consented.   Anesthesia Topical anesthesia was used. Anesthetic medications included Lidocaine 2%, Proparacaine 0.5%.   Procedure Preparation included 5% betadine to ocular surface, eyelid speculum. A (32g) needle was used.   Injection: 2 mg aflibercept 2 MG/0.05ML   Route: Intravitreal, Site: Left Eye   NDC: L6038910, Lot: [id number], Expiration date: 03/21/2025, Waste: 0 mL   Post-op Post injection exam found visual acuity of at least counting fingers. The patient tolerated the procedure well. There  were no complications. The patient  received written and verbal post procedure care education.            ASSESSMENT/PLAN:    ICD-10-CM   1. Moderate nonproliferative diabetic retinopathy of both eyes with macular edema associated with type 2 diabetes mellitus (HCC)  E11.3313 OCT, Retina - OU - Both Eyes    Intravitreal Injection, Pharmacologic Agent - OD - Right Eye    Intravitreal Injection, Pharmacologic Agent - OS - Left Eye    aflibercept (EYLEA) SOLN 2 mg    Bevacizumab (AVASTIN) SOLN 1.25 mg    2. Diabetes mellitus treated with oral medication (HCC)  E11.9    Z79.84     3. Retinal hole of right eye  H33.321     4. Essential hypertension  I10     5. Hypertensive retinopathy of both eyes  H35.033     6. Pseudophakia  Z96.1     7. Dry eyes  H04.123     8. Herpes keratitis of right eye  B00.52      1,2. Moderate non-proliferative diabetic retinopathy, OU  - delayed follow up from 7 weeks to 9 weeks (09.30.24 to 11.20.24)  - s/p IVA OD #1 (09.16.24) - s/p IVA OS #1 (05.07.21), #2 (06.04.21), #3 (07.06.21), #4 (08.03.21), #5 (08.31.21), #6 (9.28.21), #7 (11.2.21), #8 (12.07.21), #9 (02.02.22), #10 (03.30.22), #11 (05.25.22), #12 (08.03.22), #13 (10.20.22), #14 (01.12.23), #15 (01.22.24), #16 (03.11.24), #17 (03.11.24), #18 (04.22.24), #19 (06.03.24), #20 (07.15.24) - s/p IVE OS #1 (08.19.24), #2 (09.16.24)  - s/p PRP OS (04.09.21) -- good laser changes - s/p PRP OD (02.05.24) for peripheral vascular nonperfusion -- good laser changes - repeat FA (09.28.21) shows late leaking MA OU, significant capillary drop-out  - repeat FA (01.23.24) shows OD: Scattered patches of vascular non-perfusion; large area of vascular non perfusion temporally, no NV; OS: delayed filling; scattered patches of vascular non perfusion peripherally, staining of 360 laser, scattered leaking MA -- most prominent IT to fovea, no NV  - exam shows scattered MA/IRH/CWS OU -- improving; new vitreous opacities OU improved  - BCVA OD improved to  20/40 from 20/70, OS stable at 20/30  - OCT shows OD: NFP, no IRF / SRF -- mild diffuse retinal thinning -- stable, trace cystic changes nasal and temporal fovea, interval improvement in vitreous opacities; OS: NFP, no SRF, interval improvement in IRF / IRHM temporal fovea and mac, +vitreous opacities -- improved at 9 weeks - recommend IVA OD #2 and IVE OS #3 today, 11.20.24 with f/u in 8 weeks - RBA of procedure discussed, questions answered - see procedure note - IVA informed consent obtained and signed OD (09.16.24) - IVE informed consent obtained and signed OS (08.19.24)  - f/u 8 weeks, DFE, OCT  3. Operculated retinal hole w/ cuff of SRF / focal RD, right eye - operculated hole located at 1000 with partial pigment and +cuff of SRF / focal RD  - s/p retinopexy OD (03.17.21) -- good laser changes surrounding  - stable--monitor  4,5. Hypertensive retinopathy OU  - discussed importance of tight BP control  - monitor  6. Pseudophakia OD  - s/p CE/IOL (Dr. Laruth Bouchard, 07.02.24)  - IOL in good position, doing well  - monitor  7. Dry eyes OU  - recommend artificial tears and lubricating ointment as needed  8 Herpes Keratitis, OD  - exam today with improved injection; no dendrites; still with +AC cell  - completed 14 days of Valtrex 1mg  PO BID -  cont PF 6x/day OU for AC cell -- decreased to QID per Dr. Dione Booze on 11.11.24-- start PF taper --  3,2,1 drops daily, decrease weekly - monitor  Ophthalmic Meds Ordered this visit:  Meds ordered this encounter  Medications   aflibercept (EYLEA) SOLN 2 mg   Bevacizumab (AVASTIN) SOLN 1.25 mg     Return in about 8 weeks (around 01/06/2024) for f/u NPDR OU, DFE, OCT.  There are no Patient Instructions on file for this visit.  Explained the diagnoses, plan, and follow up with the patient and they expressed understanding.  Patient expressed understanding of the importance of proper follow up care.   This document serves as a record of services  personally performed by Karie Chimera, MD, PhD. It was created on their behalf by De Blanch, an ophthalmic technician. The creation of this record is the provider's dictation and/or activities during the visit.    Electronically signed by: De Blanch, OA, 11/11/23  12:54 PM  This document serves as a record of services personally performed by Karie Chimera, MD, PhD. It was created on their behalf by Glee Arvin. Manson Passey, OA an ophthalmic technician. The creation of this record is the provider's dictation and/or activities during the visit.    Electronically signed by: Glee Arvin. Manson Passey, OA 11/11/23 12:54 PM  Karie Chimera, M.D., Ph.D. Diseases & Surgery of the Retina and Vitreous [facility]  I have reviewed the above documentation for accuracy and completeness, and I agree with the above. Karie Chimera, M.D., Ph.D. 11/11/23 12:55 PM   Abbreviations: M myopia (nearsighted); A astigmatism; H hyperopia (farsighted); P presbyopia; Mrx spectacle prescription;  CTL contact lenses; OD right eye; OS left eye; OU both eyes  XT exotropia; ET esotropia; PEK punctate epithelial keratitis; PEE punctate epithelial erosions; DES dry eye syndrome; MGD meibomian gland dysfunction; ATs artificial tears; PFAT's preservative free artificial tears; NSC nuclear sclerotic cataract; PSC posterior subcapsular cataract; ERM epi-retinal membrane; PVD posterior vitreous detachment; RD retinal detachment; DM diabetes mellitus; DR diabetic retinopathy; NPDR non-proliferative diabetic retinopathy; PDR proliferative diabetic retinopathy; CSME clinically significant macular edema; DME diabetic macular edema; dbh dot blot hemorrhages; CWS cotton wool spot; POAG primary open angle glaucoma; C/D cup-to-disc ratio; HVF humphrey visual field; GVF goldmann visual field; OCT optical coherence tomography; IOP intraocular pressure; BRVO Branch retinal vein occlusion; CRVO central retinal vein occlusion;  CRAO central retinal artery occlusion; BRAO branch retinal artery occlusion; RT retinal tear; SB scleral buckle; PPV pars plana vitrectomy; VH Vitreous hemorrhage; PRP panretinal laser photocoagulation; IVK intravitreal kenalog; VMT vitreomacular traction; MH Macular hole;  NVD neovascularization of the disc; NVE neovascularization elsewhere; AREDS age related eye disease study; ARMD age related macular degeneration; POAG primary open angle glaucoma; EBMD epithelial/anterior basement membrane dystrophy; ACIOL anterior chamber intraocular lens; IOL intraocular lens; PCIOL posterior chamber intraocular lens; Phaco/IOL phacoemulsification with intraocular lens placement; PRK photorefractive keratectomy; LASIK laser assisted in situ keratomileusis; HTN hypertension; DM diabetes mellitus; COPD chronic obstructive pulmonary disease

## 2023-11-10 ENCOUNTER — Ambulatory Visit (HOSPITAL_BASED_OUTPATIENT_CLINIC_OR_DEPARTMENT_OTHER)
Admission: RE | Admit: 2023-11-10 | Discharge: 2023-11-10 | Disposition: A | Discharge status: Home / Self Care | Payer: Medicare HMO | Source: Ambulatory Visit | Attending: Nurse Practitioner | Admitting: Nurse Practitioner

## 2023-11-10 DIAGNOSIS — R600 Localized edema: Secondary | ICD-10-CM | POA: Insufficient documentation

## 2023-11-10 DIAGNOSIS — I5040 Unspecified combined systolic (congestive) and diastolic (congestive) heart failure: Secondary | ICD-10-CM | POA: Insufficient documentation

## 2023-11-10 DIAGNOSIS — I10 Essential (primary) hypertension: Secondary | ICD-10-CM | POA: Diagnosis present

## 2023-11-10 DIAGNOSIS — I77810 Thoracic aortic ectasia: Secondary | ICD-10-CM | POA: Diagnosis present

## 2023-11-10 DIAGNOSIS — E785 Hyperlipidemia, unspecified: Secondary | ICD-10-CM | POA: Diagnosis present

## 2023-11-11 ENCOUNTER — Ambulatory Visit (INDEPENDENT_AMBULATORY_CARE_PROVIDER_SITE_OTHER): Payer: Medicare HMO | Admitting: Ophthalmology

## 2023-11-11 ENCOUNTER — Encounter (INDEPENDENT_AMBULATORY_CARE_PROVIDER_SITE_OTHER): Payer: Self-pay | Admitting: Ophthalmology

## 2023-11-11 DIAGNOSIS — H04123 Dry eye syndrome of bilateral lacrimal glands: Secondary | ICD-10-CM

## 2023-11-11 DIAGNOSIS — Z7984 Long term (current) use of oral hypoglycemic drugs: Secondary | ICD-10-CM | POA: Diagnosis not present

## 2023-11-11 DIAGNOSIS — B0052 Herpesviral keratitis: Secondary | ICD-10-CM

## 2023-11-11 DIAGNOSIS — I10 Essential (primary) hypertension: Secondary | ICD-10-CM

## 2023-11-11 DIAGNOSIS — H35033 Hypertensive retinopathy, bilateral: Secondary | ICD-10-CM

## 2023-11-11 DIAGNOSIS — H33321 Round hole, right eye: Secondary | ICD-10-CM

## 2023-11-11 DIAGNOSIS — E113313 Type 2 diabetes mellitus with moderate nonproliferative diabetic retinopathy with macular edema, bilateral: Secondary | ICD-10-CM

## 2023-11-11 DIAGNOSIS — Z961 Presence of intraocular lens: Secondary | ICD-10-CM

## 2023-11-11 DIAGNOSIS — E119 Type 2 diabetes mellitus without complications: Secondary | ICD-10-CM

## 2023-11-11 MED ORDER — BEVACIZUMAB CHEMO INJECTION 1.25MG/0.05ML SYRINGE FOR KALEIDOSCOPE
1.2500 mg | INTRAVITREAL | Status: AC | PRN
  Administered 2023-11-11: 1.25 mg via INTRAVITREAL

## 2023-11-11 MED ORDER — AFLIBERCEPT 2MG/0.05ML IZ SOLN FOR KALEIDOSCOPE
2.0000 mg | INTRAVITREAL | Status: AC | PRN
Start: 2023-11-11 — End: 2023-11-11
  Administered 2023-11-11: 2 mg via INTRAVITREAL

## 2023-11-15 ENCOUNTER — Other Ambulatory Visit: Payer: Self-pay | Admitting: Adult Health

## 2023-11-16 ENCOUNTER — Other Ambulatory Visit: Payer: Self-pay

## 2023-11-16 ENCOUNTER — Other Ambulatory Visit: Payer: Self-pay | Admitting: Anesthesiology

## 2023-11-16 MED ORDER — MEMANTINE HCL 10 MG PO TABS: 10.0000 mg | ORAL_TABLET | Freq: Two times a day (BID) | ORAL | 3 refills | Status: DC

## 2023-11-16 MED ORDER — TRAZODONE HCL 150 MG PO TABS: 150.0000 mg | ORAL_TABLET | Freq: Every day | ORAL | 1 refills | Status: DC

## 2023-11-17 ENCOUNTER — Ambulatory Visit: Payer: Medicare HMO | Attending: Internal Medicine | Admitting: Internal Medicine

## 2023-11-17 ENCOUNTER — Encounter: Payer: Self-pay | Admitting: Internal Medicine

## 2023-11-17 VITALS — BP 130/58 | HR 74 | Ht 67.0 in | Wt 193.4 lb

## 2023-11-17 DIAGNOSIS — R600 Localized edema: Secondary | ICD-10-CM | POA: Diagnosis not present

## 2023-11-17 NOTE — Progress Notes (Unsigned)
Cardiology Office Note   Date:  11/17/2023   ID:  [name], DOB [birth year], MRN [id number]  PCP:  Raymon Mutton., FNP  Cardiologist:   Dietrich Pates, MD   Pt presents to establish in general cardiology for f/u of HTN    History of Present Illness: [name] is a 72 y.o. male with a history of HTN, Type II DM and CVA  Hosp in Jan 2021 with progressive confusion.  Found to have an acute R MCA infarct  Echo showed LVEF was 45 to 50% with mild global hypokinesis.   There was no significant valvular abnormalities     Since d/c the pt was seen by A Tillery in [place] (has ILR)  The pt and daughter are here    He says his breathing is OK   He denies ever having CP    Daughter concerned because his wt is down about 30 lbs since Jan.   He denies nausea,   Appetite is OK  Is eating   Has had diarrhea for the past few days          Current Meds  Medication Sig   aspirin 325 MG EC tablet aspirin 325 mg tablet,delayed release   atorvastatin (LIPITOR) 40 MG tablet Take 1 tablet (40 mg total) by mouth daily at 6 PM.   carvedilol (COREG) 6.25 MG tablet Take 6.25 mg by mouth 2 (two) times daily with a meal.   donepezil (ARICEPT) 10 MG tablet Take 1 tablet (10 mg total) by mouth at bedtime.   furosemide (LASIX) 40 MG tablet Take 1 tablet (40 mg total) by mouth 2 (two) times daily for 4 days, THEN 1 tablet (40 mg total) daily.   hydrochlorothiazide (HYDRODIURIL) 25 MG tablet Take 25 mg by mouth daily.   latanoprost (XALATAN) 0.005 % ophthalmic solution Place 1 drop into both eyes at bedtime.   losartan (COZAAR) 100 MG tablet Take 100 mg by mouth daily.   memantine (NAMENDA) 10 MG tablet Take 1 tablet (10 mg total) by mouth 2 (two) times daily.   metFORMIN (GLUCOPHAGE) 500 MG tablet Take by mouth 2 (two) times daily with a meal.   Multiple Vitamins-Minerals (ONE-A-DAY MENS 50+ PO) Take 1 tablet by mouth daily.   Polyvinyl Alcohol-Povidone (REFRESH OP) Place 1 drop into both eyes  in the morning and at bedtime.   Potassium Chloride ER 20 MEQ TBCR Take 1 tablet (20 mEq total) by mouth in the morning and at bedtime for 4 days, THEN 1 tablet (20 mEq total) daily.   prednisoLONE acetate (PRED FORTE) 1 % ophthalmic suspension Place 1 drop into both eyes 2 (two) times daily.   QUEtiapine (SEROQUEL) 25 MG tablet Take 25 mg by mouth at bedtime.   traZODone (DESYREL) 150 MG tablet Take 1 tablet (150 mg total) by mouth at bedtime.   Current Facility-Administered Medications for the 11/17/23 encounter (Office Visit) with Pricilla Riffle, MD  Medication   0.9 %  sodium chloride infusion   0.9 %  sodium chloride infusion     Allergies:   Shellfish allergy   Past Medical History:  Diagnosis Date   Acute cerebrovascular accident (CVA) (HCC) 01/20/2020   Benign essential HTN 01/20/2020   Cataract    Mixed form OU   Dementia (HCC)    Diabetic retinopathy (HCC)    NPDR OU   Hypertensive retinopathy    OU   Seizures (HCC)    per pt- "none since teenage years"  Past Surgical History:  Procedure Laterality Date   LOOP RECORDER INSERTION N/A 01/23/2020   Procedure: LOOP RECORDER INSERTION;  Surgeon: Duke Salvia, MD;  Location: [facility] INVASIVE CV LAB;  Service: Cardiovascular;  Laterality: N/A;     Social History:  The patient  reports that he has never smoked. He has never used smokeless tobacco. He reports that he does not drink alcohol and does not use drugs.   Family History:  The patient's family history includes COPD in his mother; Cancer in his father; Dementia in his father; Diabetes in his father and mother.    ROS:  Please see the history of present illness. All other systems are reviewed and  Negative to the above problem except as noted.    PHYSICAL EXAM: VS:  BP (!) 130/58   Pulse 74   Ht 5\' 7"  (1.702 m)   Wt 193 lb 6.4 oz (87.7 kg)   SpO2 97%   BMI 30.29 kg/m   GEN: Well nourished, well developed, in no acute distress  HEENT: normal  Neck: no JVD,  No carotid bruits Cardiac: RRR; no murmurs, rubs, or gallops,no LE  edema  Respiratory:  clear to auscultation bilaterally, normal work of breathing GI: soft, nontender, nondistended, + BS  No hepatomegaly  MS: no deformity Moving all extremities   Skin: warm and dry, no rash Neuro  Deferred Psych: euthymic mood, full affect   EKG:  EKG is ordered today.   Lipid Panel    Component Value Date/Time   CHOL 119 02/24/2023 0740   TRIG 46 02/24/2023 0740   HDL 46 02/24/2023 0740   CHOLHDL 2.6 02/24/2023 0740   CHOLHDL 6.1 01/21/2020 0214   VLDL 15 01/21/2020 0214   LDLCALC 62 02/24/2023 0740      Wt Readings from Last 3 Encounters:  11/17/23 193 lb 6.4 oz (87.7 kg)  09/16/23 187 lb (84.8 kg)  05/22/23 178 lb 3.2 oz (80.8 kg)      ASSESSMENT AND PLAN:  1  CVA   Pt recently hospitalized Now on ASA and plavix  Has ILR   Followed in [place]  Continue   2   Mild LV dysfunction   Pt without CP  VOlume status is OK   I would continue medical Rx   Plan to reassess LV function later this summer   Keep on same meds    3  HTN   BP is high today   Will increase Cozaar to 50 mg   4  HL   Now on statin   WIll check lpids   Check:  CBC , BMET, lpids, liver panel and A1C  Plan for f/u in 2 months      Current medicines are reviewed at length with the patient today.  The patient does not have concerns regarding medicines.  Signed, Dietrich Pates, MD  11/17/2023 10:51 AM    [facility] [address] [place], [place], [place]  [address] Phone: [phone]; Fax: [phone]

## 2023-11-17 NOTE — Patient Instructions (Signed)
Medication Instructions:   *If you need a refill on your cardiac medications before your next appointment, please call your pharmacy*   Lab Work: bmet If you have labs (blood work) drawn today and your tests are completely normal, you will receive your results only by: MyChart Message (if you have MyChart) OR A paper copy in the mail If you have any lab test that is abnormal or we need to change your treatment, we will call you to review the results.   Testing/Procedures:    Follow-Up: At [facility], you and your health needs are our priority.  As part of our continuing mission to provide you with exceptional heart care, we have created designated Provider Care Teams.  These Care Teams include your primary Cardiologist (physician) and Advanced Practice Providers (APPs -  Physician Assistants and Nurse Practitioners) who all work together to provide you with the care you need, when you need it.  We recommend signing up for the patient portal called "MyChart".  Sign up information is provided on this After Visit Summary.  MyChart is used to connect with patients for Virtual Visits (Telemedicine).  Patients are able to view lab/test results, encounter notes, upcoming appointments, etc.  Non-urgent messages can be sent to your provider as well.   To learn more about what you can do with MyChart, go to [email or web address].

## 2023-11-18 LAB — BASIC METABOLIC PANEL
BUN/Creatinine Ratio: 22 (ref 10–24)
BUN: 26 mg/dL (ref 8–27)
CO2: 30 mmol/L — ABNORMAL HIGH (ref 20–29)
Calcium: 9.4 mg/dL (ref 8.6–10.2)
Chloride: 102 mmol/L (ref 96–106)
Creatinine, Ser: 1.18 mg/dL (ref 0.76–1.27)
Glucose: 77 mg/dL (ref 70–99)
Potassium: 4 mmol/L (ref 3.5–5.2)
Sodium: 142 mmol/L (ref 134–144)
eGFR: 66 mL/min/{1.73_m2} (ref >59)

## 2023-11-23 DIAGNOSIS — I639 Cerebral infarction, unspecified: Secondary | ICD-10-CM

## 2023-11-27 ENCOUNTER — Ambulatory Visit (INDEPENDENT_AMBULATORY_CARE_PROVIDER_SITE_OTHER): Payer: Medicare HMO

## 2023-11-27 DIAGNOSIS — R55 Syncope and collapse: Secondary | ICD-10-CM | POA: Diagnosis not present

## 2023-11-27 LAB — CUP PACEART REMOTE DEVICE CHECK
Date Time Interrogation Session: 20241205230102
Implantable Pulse Generator Implant Date: 20210201

## 2023-11-27 NOTE — Progress Notes (Signed)
[facility] Summary Report / Loop Recorder 

## 2023-12-07 ENCOUNTER — Ambulatory Visit: Payer: Medicare HMO

## 2024-01-01 ENCOUNTER — Ambulatory Visit: Payer: Medicare HMO

## 2024-01-01 DIAGNOSIS — R55 Syncope and collapse: Secondary | ICD-10-CM

## 2024-01-01 LAB — CUP PACEART REMOTE DEVICE CHECK
Date Time Interrogation Session: 20250109230137
Implantable Pulse Generator Implant Date: 20210201

## 2024-01-04 NOTE — Progress Notes (Signed)
 [facility] - Clinic Note  01/06/2024     CHIEF COMPLAINT Patient presents for Retina Follow Up   HISTORY OF PRESENT ILLNESS: [name] is a 73 y.o. male who presents to the clinic today for:   HPI     Retina Follow Up   Patient presents with  Diabetic Retinopathy.  In both eyes.  Severity is moderate.  Duration of 8 weeks.  Since onset it is stable.  I, the attending physician,  performed the HPI with the patient and updated documentation appropriately.        Comments   Pt here for 8 wk ret f/u NPDR OU. Pt states VA is stable, unchanged. Pt is on drops but unable to report which or how often.       Last edited by Ronelle Coffee, MD on 01/06/2024 12:01 PM.        Referring physician: Shannan Dart., FNP [address] [place],  [place] [address]  HISTORICAL INFORMATION:   Selected notes from the MEDICAL RECORD NUMBER Referred by Dr. Diedre Fox for concern of HTN Ret   CURRENT MEDICATIONS: Current Outpatient Medications (Ophthalmic Drugs)  Medication Sig   latanoprost  (XALATAN ) 0.005 % ophthalmic solution Place 1 drop into both eyes at bedtime.   Polyvinyl Alcohol -Povidone (REFRESH OP) Place 1 drop into both eyes in the morning and at bedtime.   prednisoLONE  acetate (PRED FORTE ) 1 % ophthalmic suspension Place 1 drop into both eyes 2 (two) times daily.   No current facility-administered medications for this visit. (Ophthalmic Drugs)   Current Outpatient Medications (Other)  Medication Sig   aspirin  325 MG EC tablet aspirin  325 mg tablet,delayed release   atorvastatin  (LIPITOR) 40 MG tablet Take 1 tablet (40 mg total) by mouth daily at 6 PM.   carvedilol  (COREG ) 6.25 MG tablet Take 6.25 mg by mouth 2 (two) times daily with a meal.   donepezil  (ARICEPT ) 10 MG tablet Take 1 tablet (10 mg total) by mouth at bedtime.   hydrochlorothiazide  (HYDRODIURIL ) 25 MG tablet Take 25 mg by mouth daily.   losartan  (COZAAR ) 100 MG tablet Take 100 mg by  mouth daily.   memantine  (NAMENDA ) 10 MG tablet Take 1 tablet (10 mg total) by mouth 2 (two) times daily.   metFORMIN  (GLUCOPHAGE ) 500 MG tablet Take by mouth 2 (two) times daily with a meal.   Multiple Vitamins-Minerals (ONE-A-DAY MENS 50+ PO) Take 1 tablet by mouth daily.   QUEtiapine  (SEROQUEL ) 25 MG tablet Take 25 mg by mouth at bedtime.   traZODone  (DESYREL ) 150 MG tablet Take 1 tablet (150 mg total) by mouth at bedtime.   furosemide  (LASIX ) 40 MG tablet Take 1 tablet (40 mg total) by mouth 2 (two) times daily for 4 days, THEN 1 tablet (40 mg total) daily.   Potassium Chloride  ER 20 MEQ TBCR Take 1 tablet (20 mEq total) by mouth in the morning and at bedtime for 4 days, THEN 1 tablet (20 mEq total) daily.   Current Facility-Administered Medications (Other)  Medication Route   0.9 %  sodium chloride  infusion Intravenous   0.9 %  sodium chloride  infusion Intravenous   REVIEW OF SYSTEMS: ROS   Positive for: Endocrine, Eyes Negative for: Constitutional, Gastrointestinal, Neurological, Skin, Genitourinary, Musculoskeletal, HENT, Cardiovascular, Respiratory, Psychiatric, Allergic/Imm, Heme/Lymph Last edited by Anthony Bateman, COT on 01/06/2024  8:04 AM.        ALLERGIES Allergies  Allergen Reactions   Shellfish Allergy Anaphylaxis   PAST MEDICAL  HISTORY Past Medical History:  Diagnosis Date   Acute cerebrovascular accident (CVA) (HCC) 01/20/2020   Benign essential HTN 01/20/2020   Cataract    Mixed form OU   Dementia (HCC)    Diabetic retinopathy (HCC)    NPDR OU   Hypertensive retinopathy    OU   Seizures (HCC)    per pt- "none since teenage years"   Past Surgical History:  Procedure Laterality Date   LOOP RECORDER INSERTION N/A 01/23/2020   Procedure: LOOP RECORDER INSERTION;  Surgeon: Verona Goodwill, MD;  Location: [facility] INVASIVE CV LAB;  Service: Cardiovascular;  Laterality: N/A;   FAMILY HISTORY Family History  Problem Relation Age of Onset   COPD Mother     Diabetes Mother    Dementia Father    Diabetes Father    Cancer Father        prostate and lung   Colon cancer Neg Hx    Esophageal cancer Neg Hx    Stomach cancer Neg Hx    Rectal cancer Neg Hx    SOCIAL HISTORY Social History   Tobacco Use   Smoking status: Never   Smokeless tobacco: Never  Vaping Use   Vaping status: Never Used  Substance Use Topics   Alcohol  use: Never   Drug use: Never       OPHTHALMIC EXAM: Base Eye Exam     Visual Acuity (Snellen - Linear)       Right Left   Dist [place] 20/40 +1 20/30   Dist ph [place] NI NI         Tonometry (Tonopen, 8:12 AM)       Right Left   Pressure 17 17         Pupils       Pupils Dark Light Shape React APD   Right PERRL 3 2 Round Brisk None   Left PERRL 3 2 Round Brisk None         Visual Fields (Counting fingers)       Left Right    Full Full         Extraocular Movement       Right Left    Full, Ortho Full, Ortho         Neuro/Psych     Oriented x3: Yes   Mood/Affect: Normal         Dilation     Both eyes: 1.0% Mydriacyl, 2.5% Phenylephrine @ 8:13 AM           Slit Lamp and Fundus Exam     Slit Lamp Exam       Right Left   Lids/Lashes Dermatochalasis - upper lid, MGD, Ptosis Dermatochalasis - upper lid   Conjunctiva/Sclera Nasal and temporal Pinguecula, Melanosis Nasal and temporal Pinguecula, mild Melanosis   Cornea Arcus, well healed cataract wound, no dendrites, pigmented KP inferiorly, 1+ fine Punctate epithelial erosions Arcus, 2+fine Punctate epithelial erosions, well healed cataract wound   Anterior Chamber deep and clear, no cell or flare deep and clear, no cell or flare   Iris Round and dilated, No NVI Round and dilated, No NVI   Lens PC IOL in good position PC IOL in good postition   Anterior Vitreous Vitreous syneresis, vitreous condensations clearing Vitreous syneresis, Posterior vitreous detachment, fine pigment         Fundus Exam       Right Left   Disc  sharp rim, 2-3+pallor, +cupping Sharp, mild Pallor   C/D Ratio 0.6  0.5   Macula Flat, good foveal reflex, scattered MA / DBH greatest superior mac, trace cystic changes Flat, good foveal reflex, scattered Microaneurysms/DBH, cystic changes IT macula -- improved   Vessels attenuated, tortuous, focal flame heme along ST arcades -- resolved attenuated, Tortuous, Telangiectasia   Periphery Attached, operculated hole at 1000 with partial pigment and +cuff of SRF -- good laser changes surrounding, scattered MA/DBH greatest posteriorly, White without pressure temporal periphery, good peripheral PRP changes 360, scattered DBH greatest superiorly Attached, scattered DBH, focal pigmented CR scar at 0130 equator, good peripheral 360 PRP           IMAGING AND PROCEDURES  Imaging and Procedures for @TODAY @  OCT, Retina - OU - Both Eyes       Right Eye Quality was poor. Central Foveal Thickness: 245. Progression has improved. Findings include normal foveal contour, no IRF, no SRF (mild diffuse retinal thinning -- stable, trace cystic changes nasal and temporal fovea, mild interval improvement in vitreous opacities).   Left Eye Quality was poor. Central Foveal Thickness: 301. Progression has improved. Findings include normal foveal contour, no SRF, intraretinal hyper-reflective material, intraretinal fluid (Mild interval improvement in IRF / IRHM temporal fovea and mac, +vitreous opacities -- improved).   Notes *Images captured and stored on drive  Diagnosis / Impression:  OD: NFP, no IRF / SRF -- mild diffuse retinal thinning -- stable, trace cystic changes nasal and temporal fovea, mild interval improvement in vitreous opacities OS: NFP, no SRF, mild interval improvement in IRF / IRHM temporal fovea and mac, +vitreous opacities -- improved   Clinical management:  See below  Abbreviations: NFP - Normal foveal profile. CME - cystoid macular edema. PED - pigment epithelial detachment. IRF -  intraretinal fluid. SRF - subretinal fluid. EZ - ellipsoid zone. ERM - epiretinal membrane. ORA - outer retinal atrophy. ORT - outer retinal tubulation. SRHM - subretinal hyper-reflective material      Intravitreal Injection, Pharmacologic Agent - OD - Right Eye       Time Out 01/06/2024. 9:13 AM. Confirmed correct patient, procedure, site, and patient consented.   Anesthesia Topical anesthesia was used. Anesthetic medications included Lidocaine  2%, Proparacaine 0.5%.   Procedure Preparation included 5% betadine to ocular surface, eyelid speculum. A supplied (32g) needle was used.   Injection: 1.25 mg Bevacizumab  1.25mg /0.33ml   Route: Intravitreal, Site: Right Eye   NDC: C2662926, Lot: [id number], Expiration date: 02/04/2024   Post-op Post injection exam found visual acuity of at least counting fingers. The patient tolerated the procedure well. There were no complications. The patient received written and verbal post procedure care education.      Intravitreal Injection, Pharmacologic Agent - OS - Left Eye       Time Out 01/06/2024. 9:13 AM. Confirmed correct patient, procedure, site, and patient consented.   Anesthesia Topical anesthesia was used. Anesthetic medications included Lidocaine  2%, Proparacaine 0.5%.   Procedure Preparation included 5% betadine to ocular surface, eyelid speculum. A (32g) needle was used.   Injection: 2 mg aflibercept  2 MG/0.05ML   Route: Intravitreal, Site: Left Eye   NDC: Q956576, Lot: [id number], Expiration date: 03/21/2024, Waste: 0 mL   Post-op Post injection exam found visual acuity of at least counting fingers. The patient tolerated the procedure well. There were no complications. The patient received written and verbal post procedure care education.   Notes **SAMPLE MEDICATION ADMINISTERED**             ASSESSMENT/PLAN:    ICD-10-CM   1.  Moderate nonproliferative diabetic retinopathy of both eyes with macular edema  associated with type 2 diabetes mellitus (HCC)  E11.3313 OCT, Retina - OU - Both Eyes    Intravitreal Injection, Pharmacologic Agent - OD - Right Eye    Intravitreal Injection, Pharmacologic Agent - OS - Left Eye    aflibercept  (EYLEA ) SOLN 2 mg    Bevacizumab  (AVASTIN ) SOLN 1.25 mg    2. Diabetes mellitus treated with oral medication (HCC)  E11.9    Z79.84     3. Retinal hole of right eye  H33.321     4. Essential hypertension  I10     5. Hypertensive retinopathy of both eyes  H35.033     6. Pseudophakia, both eyes  Z96.1     7. Dry eyes  H04.123     8. Herpes keratitis of right eye  B00.52      1,2. Moderate non-proliferative diabetic retinopathy, OU  - delayed follow up from 7 weeks to 9 weeks (09.30.24 to 11.20.24)  - s/p IVA OD #1 (09.16.24), #2 (11.20.24) - s/p IVA OS #1 (05.07.21), #2 (06.04.21), #3 (07.06.21), #4 (08.03.21), #5 (08.31.21), #6 (9.28.21), #7 (11.2.21), #8 (12.07.21), #9 (02.02.22), #10 (03.30.22), #11 (05.25.22), #12 (08.03.22), #13 (10.20.22), #14 (01.12.23), #15 (01.22.24), #16 (03.11.24), #17 (03.11.24), #18 (04.22.24), #19 (06.03.24), #20 (07.15.24) - s/p IVE OS #1 (08.19.24), #2 (09.16.24), #3 (11.20.24)  - s/p PRP OS (04.09.21) -- good laser changes - s/p PRP OD (02.05.24) for peripheral vascular nonperfusion -- good laser changes - repeat FA (09.28.21) shows late leaking MA OU, significant capillary drop-out  - repeat FA (01.23.24) shows OD: Scattered patches of vascular non-perfusion; large area of vascular non perfusion temporally, no NV; OS: delayed filling; scattered patches of vascular non perfusion peripherally, staining of 360 laser, scattered leaking MA -- most prominent IT to fovea, no NV  - exam shows scattered MA/IRH/CWS OU -- improving; new vitreous opacities OU improved  - BCVA OD stable at 20/40, OS stable at 20/30  - OCT shows OD: NFP, no IRF / SRF -- mild diffuse retinal thinning -- stable, trace cystic changes nasal and temporal fovea,  interval improvement in vitreous opacities; OS: interval improvement in IRF / IRHM temporal fovea and mac, +vitreous opacities -- improved at 8 weeks - recommend IVA OD #3 and IVE OS #4 [sample] today, 01.14.25 with f/u in 8 weeks again - [employer] funding unavailable at this time - RBA of procedure discussed, questions answered - see procedure note - IVA informed consent obtained and signed OD (09.16.24) - IVE informed consent obtained and signed OS (08.19.24)  - f/u 8 weeks, DFE, OCT, possible injxns  3. Operculated retinal hole w/ cuff of SRF / focal RD, right eye - operculated hole located at 1000 with partial pigment and +cuff of SRF / focal RD  - s/p retinopexy OD (03.17.21) -- good laser changes surrounding  - stable--monitor  4,5. Hypertensive retinopathy OU  - discussed importance of tight BP control  - monitor  6. Pseudophakia OU  - s/p CE/IOL (Dr. Marvin Slot, OD: 07.02.24)  - IOL in good position, doing well  - monitor  7. Dry eyes OU  - recommend artificial tears and lubricating ointment as needed  8 Herpes Keratitis, OD  - exam today with improved injection; no dendrites; still with +AC cell  - completed 14 days of Valtrex  1mg  PO BID - cont PF 6x/day OU for AC cell -- decreased to QID per Dr. Candi Chafe on 11.11.24-- start PF  taper --  3,2,1 drops daily, decrease weekly - monitor  Ophthalmic Meds Ordered this visit:  Meds ordered this encounter  Medications   aflibercept  (EYLEA ) SOLN 2 mg   Bevacizumab  (AVASTIN ) SOLN 1.25 mg     Return in about 8 weeks (around 03/02/2024) for f/u NPDR OU, DFE, OCT, Possible Injxn.  There are no Patient Instructions on file for this visit.  Explained the diagnoses, plan, and follow up with the patient and they expressed understanding.  Patient expressed understanding of the importance of proper follow up care.   This document serves as a record of services personally performed by Jeanice Millard, MD, PhD. It was created on their  behalf by Morley Arabia. Bevin Bucks, OA an ophthalmic technician. The creation of this record is the provider's dictation and/or activities during the visit.    Electronically signed by: Morley Arabia. Bevin Bucks, OA 01/06/24 12:41 PM  Jeanice Millard, M.D., Ph.D. Diseases & Surgery of the Retina and Vitreous [facility]  I have reviewed the above documentation for accuracy and completeness, and I agree with the above. Jeanice Millard, M.D., Ph.D. 01/06/24 12:42 PM   Abbreviations: M myopia (nearsighted); A astigmatism; H hyperopia (farsighted); P presbyopia; Mrx spectacle prescription;  CTL contact lenses; OD right eye; OS left eye; OU both eyes  XT exotropia; ET esotropia; PEK punctate epithelial keratitis; PEE punctate epithelial erosions; DES dry eye syndrome; MGD meibomian gland dysfunction; ATs artificial tears; PFAT's preservative free artificial tears; NSC nuclear sclerotic cataract; PSC posterior subcapsular cataract; ERM epi-retinal membrane; PVD posterior vitreous detachment; RD retinal detachment; DM diabetes mellitus; DR diabetic retinopathy; NPDR non-proliferative diabetic retinopathy; PDR proliferative diabetic retinopathy; CSME clinically significant macular edema; DME diabetic macular edema; dbh dot blot hemorrhages; CWS cotton wool spot; POAG primary open angle glaucoma; C/D cup-to-disc ratio; HVF humphrey visual field; GVF goldmann visual field; OCT optical coherence tomography; IOP intraocular pressure; BRVO Branch retinal vein occlusion; CRVO central retinal vein occlusion; CRAO central retinal artery occlusion; BRAO branch retinal artery occlusion; RT retinal tear; SB scleral buckle; PPV pars plana vitrectomy; VH Vitreous hemorrhage; PRP panretinal laser photocoagulation; IVK intravitreal kenalog; VMT vitreomacular traction; MH Macular hole;  NVD neovascularization of the disc; NVE neovascularization elsewhere; AREDS age related eye disease study; ARMD age related macular  degeneration; POAG primary open angle glaucoma; EBMD epithelial/anterior basement membrane dystrophy; ACIOL anterior chamber intraocular lens; IOL intraocular lens; PCIOL posterior chamber intraocular lens; Phaco/IOL phacoemulsification with intraocular lens placement; PRK photorefractive keratectomy; LASIK laser assisted in situ keratomileusis; HTN hypertension; DM diabetes mellitus; COPD chronic obstructive pulmonary disease

## 2024-01-06 ENCOUNTER — Ambulatory Visit (INDEPENDENT_AMBULATORY_CARE_PROVIDER_SITE_OTHER): Payer: Medicare HMO | Admitting: Ophthalmology

## 2024-01-06 ENCOUNTER — Encounter (INDEPENDENT_AMBULATORY_CARE_PROVIDER_SITE_OTHER): Payer: Self-pay | Admitting: Ophthalmology

## 2024-01-06 DIAGNOSIS — E113313 Type 2 diabetes mellitus with moderate nonproliferative diabetic retinopathy with macular edema, bilateral: Secondary | ICD-10-CM | POA: Diagnosis not present

## 2024-01-06 DIAGNOSIS — H33321 Round hole, right eye: Secondary | ICD-10-CM

## 2024-01-06 DIAGNOSIS — H35033 Hypertensive retinopathy, bilateral: Secondary | ICD-10-CM | POA: Diagnosis not present

## 2024-01-06 DIAGNOSIS — H04123 Dry eye syndrome of bilateral lacrimal glands: Secondary | ICD-10-CM

## 2024-01-06 DIAGNOSIS — B0052 Herpesviral keratitis: Secondary | ICD-10-CM

## 2024-01-06 DIAGNOSIS — Z7984 Long term (current) use of oral hypoglycemic drugs: Secondary | ICD-10-CM | POA: Diagnosis not present

## 2024-01-06 DIAGNOSIS — I10 Essential (primary) hypertension: Secondary | ICD-10-CM | POA: Diagnosis not present

## 2024-01-06 DIAGNOSIS — E119 Type 2 diabetes mellitus without complications: Secondary | ICD-10-CM

## 2024-01-06 DIAGNOSIS — Z961 Presence of intraocular lens: Secondary | ICD-10-CM

## 2024-01-06 MED ORDER — AFLIBERCEPT 2MG/0.05ML IZ SOLN FOR KALEIDOSCOPE
2.0000 mg | INTRAVITREAL | Status: AC | PRN
  Administered 2024-01-06: 2 mg via INTRAVITREAL

## 2024-01-06 MED ORDER — BEVACIZUMAB CHEMO INJECTION 1.25MG/0.05ML SYRINGE FOR KALEIDOSCOPE
1.2500 mg | INTRAVITREAL | Status: AC | PRN
  Administered 2024-01-06: 1.25 mg via INTRAVITREAL

## 2024-01-11 ENCOUNTER — Ambulatory Visit: Payer: Medicare HMO

## 2024-02-05 ENCOUNTER — Ambulatory Visit (INDEPENDENT_AMBULATORY_CARE_PROVIDER_SITE_OTHER): Payer: Medicare HMO

## 2024-02-05 DIAGNOSIS — R55 Syncope and collapse: Secondary | ICD-10-CM | POA: Diagnosis not present

## 2024-02-05 NOTE — Progress Notes (Signed)
[facility] Summary Report / Loop Recorder

## 2024-02-06 LAB — CUP PACEART REMOTE DEVICE CHECK
Date Time Interrogation Session: 20250213230040
Implantable Pulse Generator Implant Date: 20210201

## 2024-02-15 ENCOUNTER — Ambulatory Visit: Payer: Medicare HMO

## 2024-02-15 ENCOUNTER — Other Ambulatory Visit: Payer: Self-pay | Admitting: Nurse Practitioner

## 2024-02-15 MED ORDER — POTASSIUM CHLORIDE ER 20 MEQ PO TBCR
1.0000 | EXTENDED_RELEASE_TABLET | Freq: Every day | ORAL | 2 refills | Status: DC
Start: 2024-02-15 — End: 2024-02-20

## 2024-02-17 ENCOUNTER — Encounter: Payer: Self-pay | Admitting: Internal Medicine

## 2024-02-19 ENCOUNTER — Other Ambulatory Visit: Payer: Self-pay

## 2024-02-19 ENCOUNTER — Inpatient Hospital Stay
Admission: EM | Admit: 2024-02-19 | Discharge: 2024-03-11 | DRG: 884 | Disposition: A | Discharge status: Skilled Nursing Facility | Payer: Medicare HMO | Attending: Internal Medicine | Admitting: Internal Medicine

## 2024-02-19 DIAGNOSIS — F03C11 Unspecified dementia, severe, with agitation: Secondary | ICD-10-CM | POA: Diagnosis not present

## 2024-02-19 DIAGNOSIS — N179 Acute kidney failure, unspecified: Principal | ICD-10-CM | POA: Diagnosis present

## 2024-02-19 DIAGNOSIS — Z833 Family history of diabetes mellitus: Secondary | ICD-10-CM

## 2024-02-19 DIAGNOSIS — E1140 Type 2 diabetes mellitus with diabetic neuropathy, unspecified: Secondary | ICD-10-CM | POA: Diagnosis present

## 2024-02-19 DIAGNOSIS — Z818 Family history of other mental and behavioral disorders: Secondary | ICD-10-CM

## 2024-02-19 DIAGNOSIS — Z825 Family history of asthma and other chronic lower respiratory diseases: Secondary | ICD-10-CM

## 2024-02-19 DIAGNOSIS — M109 Gout, unspecified: Secondary | ICD-10-CM | POA: Diagnosis present

## 2024-02-19 DIAGNOSIS — Z79899 Other long term (current) drug therapy: Secondary | ICD-10-CM

## 2024-02-19 DIAGNOSIS — R7989 Other specified abnormal findings of blood chemistry: Secondary | ICD-10-CM | POA: Diagnosis present

## 2024-02-19 DIAGNOSIS — F03C18 Unspecified dementia, severe, with other behavioral disturbance: Secondary | ICD-10-CM | POA: Diagnosis present

## 2024-02-19 DIAGNOSIS — F03918 Unspecified dementia, unspecified severity, with other behavioral disturbance: Secondary | ICD-10-CM | POA: Insufficient documentation

## 2024-02-19 DIAGNOSIS — R55 Syncope and collapse: Secondary | ICD-10-CM | POA: Diagnosis not present

## 2024-02-19 DIAGNOSIS — F03C4 Unspecified dementia, severe, with anxiety: Secondary | ICD-10-CM | POA: Diagnosis present

## 2024-02-19 DIAGNOSIS — E869 Volume depletion, unspecified: Secondary | ICD-10-CM | POA: Diagnosis present

## 2024-02-19 DIAGNOSIS — Z91013 Allergy to seafood: Secondary | ICD-10-CM

## 2024-02-19 DIAGNOSIS — E119 Type 2 diabetes mellitus without complications: Secondary | ICD-10-CM

## 2024-02-19 DIAGNOSIS — Z8673 Personal history of transient ischemic attack (TIA), and cerebral infarction without residual deficits: Secondary | ICD-10-CM

## 2024-02-19 DIAGNOSIS — Z7984 Long term (current) use of oral hypoglycemic drugs: Secondary | ICD-10-CM

## 2024-02-19 DIAGNOSIS — I10 Essential (primary) hypertension: Secondary | ICD-10-CM | POA: Diagnosis present

## 2024-02-19 DIAGNOSIS — G8929 Other chronic pain: Secondary | ICD-10-CM | POA: Diagnosis present

## 2024-02-19 DIAGNOSIS — E113293 Type 2 diabetes mellitus with mild nonproliferative diabetic retinopathy without macular edema, bilateral: Secondary | ICD-10-CM | POA: Diagnosis present

## 2024-02-19 DIAGNOSIS — Z7982 Long term (current) use of aspirin: Secondary | ICD-10-CM

## 2024-02-19 DIAGNOSIS — N400 Enlarged prostate without lower urinary tract symptoms: Secondary | ICD-10-CM | POA: Diagnosis present

## 2024-02-19 DIAGNOSIS — Z1152 Encounter for screening for COVID-19: Secondary | ICD-10-CM

## 2024-02-19 LAB — COMPREHENSIVE METABOLIC PANEL
ALT: 12 U/L (ref 0–44)
AST: 15 U/L (ref 15–41)
Albumin: 3.9 g/dL (ref 3.5–5.0)
Alkaline Phosphatase: 40 U/L (ref 38–126)
Anion gap: 10 (ref 5–15)
BUN: 35 mg/dL — ABNORMAL HIGH (ref 8–23)
CO2: 27 mmol/L (ref 22–32)
Calcium: 9.2 mg/dL (ref 8.9–10.3)
Chloride: 103 mmol/L (ref 98–111)
Creatinine, Ser: 1.31 mg/dL — ABNORMAL HIGH (ref 0.61–1.24)
GFR, Estimated: 59 mL/min — ABNORMAL LOW (ref >60)
Glucose, Bld: 105 mg/dL — ABNORMAL HIGH (ref 70–99)
Potassium: 3.7 mmol/L (ref 3.5–5.1)
Sodium: 140 mmol/L (ref 135–145)
Total Bilirubin: 0.6 mg/dL (ref 0.0–1.2)
Total Protein: 7.2 g/dL (ref 6.5–8.1)

## 2024-02-19 LAB — URINALYSIS, ROUTINE W REFLEX MICROSCOPIC
Bilirubin Urine: NEGATIVE
Glucose, UA: NEGATIVE mg/dL
Hgb urine dipstick: NEGATIVE
Ketones, ur: NEGATIVE mg/dL
Leukocytes,Ua: NEGATIVE
Nitrite: NEGATIVE
Protein, ur: NEGATIVE mg/dL
Specific Gravity, Urine: 1.021 (ref 1.005–1.030)
pH: 5 (ref 5.0–8.0)

## 2024-02-19 LAB — CBC
HCT: 32.1 % — ABNORMAL LOW (ref 39.0–52.0)
Hemoglobin: 10.9 g/dL — ABNORMAL LOW (ref 13.0–17.0)
MCH: 29.1 pg (ref 26.0–34.0)
MCHC: 34 g/dL (ref 30.0–36.0)
MCV: 85.8 fL (ref 80.0–100.0)
Platelets: 218 10*3/uL (ref 150–400)
RBC: 3.74 MIL/uL — ABNORMAL LOW (ref 4.22–5.81)
RDW: 13 % (ref 11.5–15.5)
WBC: 4.4 10*3/uL (ref 4.0–10.5)
nRBC: 0 % (ref 0.0–0.2)

## 2024-02-19 LAB — URINE DRUG SCREEN, QUALITATIVE (ARMC ONLY)
Amphetamines, Ur Screen: NOT DETECTED
Barbiturates, Ur Screen: NOT DETECTED
Benzodiazepine, Ur Scrn: NOT DETECTED
Cannabinoid 50 Ng, Ur ~~LOC~~: NOT DETECTED
Cocaine Metabolite,Ur ~~LOC~~: NOT DETECTED
MDMA (Ecstasy)Ur Screen: NOT DETECTED
Methadone Scn, Ur: NOT DETECTED
Opiate, Ur Screen: NOT DETECTED
Phencyclidine (PCP) Ur S: NOT DETECTED
Tricyclic, Ur Screen: NOT DETECTED

## 2024-02-19 LAB — ACETAMINOPHEN LEVEL: Acetaminophen (Tylenol), Serum: <10 ug/mL — ABNORMAL LOW (ref 10–30)

## 2024-02-19 LAB — ETHANOL: Alcohol, Ethyl (B): <10 mg/dL (ref <10)

## 2024-02-19 LAB — SALICYLATE LEVEL: Salicylate Lvl: <7 mg/dL — ABNORMAL LOW (ref 7.0–30.0)

## 2024-02-19 NOTE — ED Notes (Signed)
 Assisted patient to bathroom. Pt able to ambulate with minimal assistance.

## 2024-02-19 NOTE — ED Provider Notes (Signed)
 [facility] Provider Note    Event Date/Time   First MD Initiated Contact with Patient 02/19/24 2050     (approximate)   History   Psychiatric Evaluation   HPI [name] is a 73 y.o. male with history of dementia, CVA, HTN presenting today for psychiatric evaluation.  Daughter brought patient in over concerns of worsening agitation secondary to his dementia.  She states that he had an episode today where he balled up his fist and yelled at his granddaughter saying that he was going to kill her.  She states that this has happened before but is becoming more frequent and more threatening.  She does not feel safe having him at home with her and her daughter.  Otherwise they deny any acute symptoms recently.  Patient has no complaints himself.  He is on trazodone and Seroquel for agitation symptoms secondary to dementia.     Physical Exam   Triage Vital Signs: ED Triage Vitals  Encounter Vitals Group     BP 02/19/24 2019 (!) 143/67     Systolic BP Percentile --      Diastolic BP Percentile --      Pulse Rate 02/19/24 2019 80     Resp 02/19/24 2019 16     Temp 02/19/24 2019 98.7 F (37.1 C)     Temp Source 02/19/24 2019 Oral     SpO2 02/19/24 2019 100 %     Weight 02/19/24 2017 194 lb 0.1 oz (88 kg)     Height 02/19/24 2017 5\' 7"  (1.702 m)     Head Circumference --      Peak Flow --      Pain Score --      Pain Loc --      Pain Education --      Exclude from Growth Chart --     Most recent vital signs: Vitals:   02/19/24 2019  BP: (!) 143/67  Pulse: 80  Resp: 16  Temp: 98.7 F (37.1 C)  SpO2: 100%   I have reviewed the vital signs. General:  Awake, alert, no acute distress. Head:  Normocephalic, Atraumatic. EENT:  PERRL, EOMI, Oral mucosa pink and moist, Neck is supple. Cardiovascular: Regular rate, 2+ distal pulses. Respiratory:  Normal respiratory effort, symmetrical expansion, no distress.   Extremities:  Moving all four  extremities through full ROM without pain.   Neuro:  Alert and oriented only to name but not to place or time consistent with his baseline.  Interacting appropriately.   Skin:  Warm, dry, no rash.   Psych: Appropriate affect.    ED Results / Procedures / Treatments   Labs (all labs ordered are listed, but only abnormal results are displayed) Labs Reviewed  COMPREHENSIVE METABOLIC PANEL - Abnormal; Notable for the following components:      Result Value   Glucose, Bld 105 (*)    BUN 35 (*)    Creatinine, Ser 1.31 (*)    GFR, Estimated 59 (*)    All other components within normal limits  SALICYLATE LEVEL - Abnormal; Notable for the following components:   Salicylate Lvl <7.0 (*)    All other components within normal limits  ACETAMINOPHEN LEVEL - Abnormal; Notable for the following components:   Acetaminophen (Tylenol), Serum <10 (*)    All other components within normal limits  CBC - Abnormal; Notable for the following components:   RBC 3.74 (*)    Hemoglobin 10.9 (*)    HCT  32.1 (*)    All other components within normal limits  ETHANOL  URINE DRUG SCREEN, QUALITATIVE ([facility] ONLY)  URINALYSIS, ROUTINE W REFLEX MICROSCOPIC     EKG    RADIOLOGY    PROCEDURES:  Critical Care performed: No  Procedures   MEDICATIONS ORDERED IN ED: Medications - No data to display   IMPRESSION / MDM / ASSESSMENT AND PLAN / ED COURSE  I reviewed the triage vital signs and the nursing notes.                              Differential diagnosis includes, but is not limited to, agitation secondary to dementia, UTI  Patient's presentation is most consistent with severe exacerbation of chronic illness.  Patient is a 73 year old male presenting today for worsening agitation secondary to his dementia.  Vital signs are stable on arrival and patient has no acute complaints with unremarkable physical exam with mental status consistent with his baseline.  Laboratory workup is largely  reassuring at this time and consistent with his baseline.  I do suspect this is likely worsening of his dementia with agitation.  Will have psychiatry evaluate patient for additional medication recommendations.  Spoke with daughter that patient may end up in the ED for prolonged period of time while attempting to get placement and she is aware of this.  Patient signed out to oncoming provider pending psychiatric evaluation and attempt at placement.  The patient has been placed in psychiatric observation due to the need to provide a safe environment for the patient while obtaining psychiatric consultation and evaluation, as well as ongoing medical and medication management to treat the patient's condition.  The patient has not been placed under full IVC at this time.      FINAL CLINICAL IMPRESSION(S) / ED DIAGNOSES   Final diagnoses:  Severe dementia with agitation, unspecified dementia type (HCC)     Rx / DC Orders   ED Discharge Orders     None        Note:  This document was prepared using Dragon voice recognition software and may include unintentional dictation errors.   Janith Lima, MD 02/19/24 2206

## 2024-02-19 NOTE — ED Notes (Signed)
 Pt very cooperative and calm with family at bedside.
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
## 2024-02-20 MED ORDER — DROPERIDOL 2.5 MG/ML IJ SOLN
5.0000 mg | Freq: Once | INTRAMUSCULAR | Status: AC | PRN
  Administered 2024-02-20: 5 mg via INTRAMUSCULAR
  Filled 2024-02-20: qty 2

## 2024-02-20 MED ORDER — ZIPRASIDONE MESYLATE 20 MG IM SOLR
20.0000 mg | Freq: Once | INTRAMUSCULAR | Status: AC
  Administered 2024-02-20: 20 mg via INTRAMUSCULAR
  Filled 2024-02-20: qty 20

## 2024-02-20 NOTE — ED Notes (Signed)
 Lunch tray given to pt.

## 2024-02-20 NOTE — ED Provider Notes (Signed)
 Emergency Medicine Observation Re-evaluation Note  Physical Exam   BP (!) 143/67 (BP Location: Left Arm)   Pulse 80   Temp 98.7 F (37.1 C) (Oral)   Resp 16   Ht 5\' 7"  (1.702 m)   Wt 88 kg   SpO2 100%   BMI 30.39 kg/m   Patient appears agitated walking the halls demanding to leave unable to be verbally redirected.  ED Course / MDM   Given his agitation increasing unable to be re redirected verbally have ordered for a therapeutic dose of IM droperidol.  Pt is awaiting dispo from consultants   Pilar Jarvis MD    Pilar Jarvis, MD 02/20/24 [phone]

## 2024-02-20 NOTE — ED Notes (Signed)
 Pt ambulated to the bathroom and back to his room without assistance. Pt has no concerns or requests at this time.

## 2024-02-20 NOTE — ED Notes (Signed)
 Patient given water and snack. Moved chair over near door as patient wants to see what's going on in the hallway and interact with staff.

## 2024-02-20 NOTE — ED Notes (Signed)
 Patient up in hallway, not easily redirectable. Becoming increasingly agitated, clenching fists wanting to go home. Threatening to walk home.

## 2024-02-20 NOTE — BH Assessment (Signed)
 Per [facility] ([place]), patient to be referred out of system.  Referral information for Psychiatric Hospitalization faxed to;   [facility] [phone]- [phone]) No appropriate beds  [facility] [phone]- [phone]),   [facility] [phone] -[phone]) [phone]  [facility] [phone]),  [address] [phone]),   [facility] [phone] -or- [phone]),   [facility] [phone] phone-- [phone])  [facility] [phone]),  [place] [phone])  [place] [phone] or [phone]),   [facility] [phone])
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
## 2024-02-20 NOTE — ED Notes (Signed)
 Pt provided with breakfast tray.

## 2024-02-20 NOTE — ED Notes (Signed)
 Patient ambulating in room. Able to use toilet independently. Provided patient with ice cream. Patient asking why he is here and why he needs to stay here. Voicing frustration that his family left him here, wanting to leave.

## 2024-02-20 NOTE — ED Provider Notes (Signed)
-----------------------------------------   4:58 PM on 02/20/2024 ----------------------------------------- Patient has become increasingly agitated throughout the last several hours.  Initially redirectable by voice only however he is now making physical threats to the staff and punching his fists.  We will dose a dose of IM Geodon to help with patient's sedation.   Minna Antis, MD 02/20/24 [phone]

## 2024-02-20 NOTE — ED Provider Notes (Signed)
   [facility] Observation Note   ----------------------------------------- 10:28 PM on 02/20/2024 -----------------------------------------  [name] is a 73 y.o. male currently boarding in the Emergency Department.  No acute events since last update.  Recent Vitals   Most recent vital signs: Vitals:   02/20/24 0617 02/20/24 1623  BP: (!) 145/68 (!) 160/79  Pulse: 74 73  Resp: 15 16  Temp: 98.4 F (36.9 C) 98.2 F (36.8 C)  SpO2: 98% 100%    ED Results / Procedures / Treatments   Labs (all labs ordered are listed, but only abnormal results are displayed) Labs Reviewed  COMPREHENSIVE METABOLIC PANEL - Abnormal; Notable for the following components:      Result Value   Glucose, Bld 105 (*)    BUN 35 (*)    Creatinine, Ser 1.31 (*)    GFR, Estimated 59 (*)    All other components within normal limits  SALICYLATE LEVEL - Abnormal; Notable for the following components:   Salicylate Lvl <7.0 (*)    All other components within normal limits  ACETAMINOPHEN LEVEL - Abnormal; Notable for the following components:   Acetaminophen (Tylenol), Serum <10 (*)    All other components within normal limits  CBC - Abnormal; Notable for the following components:   RBC 3.74 (*)    Hemoglobin 10.9 (*)    HCT 32.1 (*)    All other components within normal limits  URINALYSIS, ROUTINE W REFLEX MICROSCOPIC - Abnormal; Notable for the following components:   Color, Urine YELLOW (*)    APPearance HAZY (*)    All other components within normal limits  ETHANOL  URINE DRUG SCREEN, QUALITATIVE ([facility] ONLY)    MEDICATIONS ORDERED IN ED: Medications  droperidol (INAPSINE) 2.5 MG/ML injection 5 mg (5 mg Intramuscular Given 02/20/24 0330)  ziprasidone (GEODON) injection 20 mg (20 mg Intramuscular Given 02/20/24 1700)     ED Plan   Currently awaiting placement into an appropriate living facility.  Social work is working with the patient to help achieve this.       Minna Antis, MD 02/20/24 2228

## 2024-02-20 NOTE — ED Notes (Signed)
 Patient remains in room sitting in chair.

## 2024-02-21 MED ORDER — ADULT MULTIVITAMIN W/MINERALS CH
1.0000 | ORAL_TABLET | Freq: Every day | ORAL | Status: DC
  Administered 2024-02-21 – 2024-03-11 (×20): 1 via ORAL
  Filled 2024-02-21 (×20): qty 1

## 2024-02-21 MED ORDER — LATANOPROST 0.005 % OP SOLN
1.0000 [drp] | Freq: Every day | OPHTHALMIC | Status: DC
  Administered 2024-02-21 – 2024-03-10 (×19): 1 [drp] via OPHTHALMIC
  Filled 2024-02-21: qty 2.5

## 2024-02-21 MED ORDER — MEMANTINE HCL 5 MG PO TABS
10.0000 mg | ORAL_TABLET | Freq: Two times a day (BID) | ORAL | Status: DC
  Administered 2024-02-21 – 2024-03-11 (×39): 10 mg via ORAL
  Filled 2024-02-21 (×39): qty 2

## 2024-02-21 MED ORDER — TRAZODONE HCL 50 MG PO TABS
150.0000 mg | ORAL_TABLET | Freq: Every day | ORAL | Status: DC
  Administered 2024-02-21 – 2024-03-10 (×19): 150 mg via ORAL
  Filled 2024-02-21 (×2): qty 3
  Filled 2024-02-21: qty 1
  Filled 2024-02-21 (×2): qty 3
  Filled 2024-02-21 (×3): qty 1
  Filled 2024-02-21 (×8): qty 3
  Filled 2024-02-21 (×2): qty 1
  Filled 2024-02-21: qty 3

## 2024-02-21 MED ORDER — HYDROCHLOROTHIAZIDE 25 MG PO TABS
25.0000 mg | ORAL_TABLET | Freq: Every day | ORAL | Status: DC
  Administered 2024-02-21 – 2024-02-27 (×7): 25 mg via ORAL
  Filled 2024-02-21 (×7): qty 1

## 2024-02-21 MED ORDER — TAMSULOSIN HCL 0.4 MG PO CAPS
0.4000 mg | ORAL_CAPSULE | Freq: Every day | ORAL | Status: DC
Start: 2024-02-21 — End: 2024-03-11
  Administered 2024-02-21 – 2024-03-11 (×20): 0.4 mg via ORAL
  Filled 2024-02-21 (×20): qty 1

## 2024-02-21 MED ORDER — CARVEDILOL 6.25 MG PO TABS
6.2500 mg | ORAL_TABLET | Freq: Two times a day (BID) | ORAL | Status: DC
  Administered 2024-02-22 – 2024-02-27 (×11): 6.25 mg via ORAL
  Filled 2024-02-21 (×11): qty 1

## 2024-02-21 MED ORDER — POLYVINYL ALCOHOL 1.4 % OP SOLN
1.0000 [drp] | Freq: Every morning | OPHTHALMIC | Status: DC
  Administered 2024-02-22 – 2024-03-11 (×19): 1 [drp] via OPHTHALMIC
  Filled 2024-02-21 (×2): qty 15

## 2024-02-21 MED ORDER — DIPHENHYDRAMINE HCL 50 MG/ML IJ SOLN
50.0000 mg | Freq: Once | INTRAMUSCULAR | Status: AC
  Administered 2024-02-21: 50 mg via INTRAMUSCULAR
  Filled 2024-02-21: qty 1

## 2024-02-21 MED ORDER — DONEPEZIL HCL 5 MG PO TABS
10.0000 mg | ORAL_TABLET | Freq: Every day | ORAL | Status: DC
  Administered 2024-02-21 – 2024-03-10 (×19): 10 mg via ORAL
  Filled 2024-02-21 (×19): qty 2

## 2024-02-21 MED ORDER — LOSARTAN POTASSIUM 50 MG PO TABS
50.0000 mg | ORAL_TABLET | Freq: Two times a day (BID) | ORAL | Status: DC
  Administered 2024-02-21 – 2024-02-27 (×13): 50 mg via ORAL
  Filled 2024-02-21 (×13): qty 1

## 2024-02-21 MED ORDER — METFORMIN HCL 500 MG PO TABS
500.0000 mg | ORAL_TABLET | Freq: Two times a day (BID) | ORAL | Status: DC
  Administered 2024-02-22 – 2024-03-11 (×37): 500 mg via ORAL
  Filled 2024-02-21 (×37): qty 1

## 2024-02-21 MED ORDER — FUROSEMIDE 40 MG PO TABS
40.0000 mg | ORAL_TABLET | Freq: Every day | ORAL | Status: DC
  Administered 2024-02-21 – 2024-02-27 (×7): 40 mg via ORAL
  Filled 2024-02-21 (×7): qty 1

## 2024-02-21 MED ORDER — QUETIAPINE FUMARATE 25 MG PO TABS
25.0000 mg | ORAL_TABLET | Freq: Every day | ORAL | Status: DC
  Administered 2024-02-21 – 2024-02-23 (×3): 25 mg via ORAL
  Filled 2024-02-21 (×3): qty 1

## 2024-02-21 MED ORDER — POTASSIUM CHLORIDE CRYS ER 20 MEQ PO TBCR
20.0000 meq | EXTENDED_RELEASE_TABLET | Freq: Every day | ORAL | Status: DC
  Administered 2024-02-21 – 2024-03-11 (×20): 20 meq via ORAL
  Filled 2024-02-21 (×20): qty 1

## 2024-02-21 MED ORDER — ATORVASTATIN CALCIUM 20 MG PO TABS
40.0000 mg | ORAL_TABLET | Freq: Every day | ORAL | Status: DC
  Administered 2024-02-22 – 2024-03-10 (×18): 40 mg via ORAL
  Filled 2024-02-21 (×19): qty 2

## 2024-02-21 MED ORDER — LORAZEPAM 2 MG/ML IJ SOLN
2.0000 mg | Freq: Once | INTRAMUSCULAR | Status: AC
  Administered 2024-02-21: 2 mg via INTRAMUSCULAR
  Filled 2024-02-21: qty 1

## 2024-02-21 MED ORDER — HALOPERIDOL LACTATE 5 MG/ML IJ SOLN
5.0000 mg | Freq: Once | INTRAMUSCULAR | Status: AC
  Administered 2024-02-21: 5 mg via INTRAMUSCULAR
  Filled 2024-02-21: qty 1

## 2024-02-21 NOTE — ED Notes (Signed)
 Pt found to have no daily medications ordered and appears pt takes multiple medications and were verified by pharmacy tech but no orders placed, MD aware, see new orders.

## 2024-02-21 NOTE — BH Assessment (Signed)
Adult/[facility] MH  Referral information for Psychiatric Hospitalization faxed to:   [facility] ([phone])   [facility] [phone]- [phone], [phone])   [facility] ([phone] or [phone]),  . [facility] ([phone] -or- [phone]), 

## 2024-02-21 NOTE — ED Notes (Signed)
 Pts daughter escorted to room to kiss pt on cheek, daughter updated on pt need of medication.

## 2024-02-21 NOTE — ED Notes (Signed)
 Pt assisted to toilet in room.  RN assisted pt back to bed.  RN set pt bed in sitting position and dinner tray setup.  Pt currently sitting up in bed eating.

## 2024-02-21 NOTE — ED Notes (Signed)
 Hospital meal provided, pt tolerated w/o complaints.  Waste discarded appropriately.

## 2024-02-21 NOTE — ED Provider Notes (Signed)
 Emergency Medicine Observation Re-evaluation Note  [name] is a 73 y.o. male, seen on rounds today.  Pt initially presented to the ED for complaints of Psychiatric Evaluation  Currently, the patient is resting comfortably.  Physical Exam  BP (!) 144/75   Pulse 80   Temp 98.1 F (36.7 C) (Oral)   Resp 20   Ht 5\' 7"  (1.702 m)   Wt 88 kg   SpO2 95%   BMI 30.39 kg/m  General: No acute distress Cardiac: Well-perfused extremities Lungs: No respiratory distress Psych: Appropriate mood and affect  ED Course / MDM  EKG:   I have reviewed the labs performed to date as well as medications administered while in observation.  Recent changes in the last 24 hours include none.  Plan  Current plan is for placement.   Merwyn Katos, MD 02/21/24 [phone]

## 2024-02-21 NOTE — ED Notes (Signed)
 Pt OOB to bedside toilet to have a BM and urinate, pt urinated on pants and socks on accident, new pants and underwear given as well as new hospital attire provided scrub pants.   Bed alarm on, pt educated to call for further assistance due to med admin, pt made aware dinner is at bedside, pt states "I am not hungry right now".

## 2024-02-21 NOTE — ED Notes (Addendum)
 Pt found to be wandering from room and redirected back to room several times and pt started to yell at this RN and telling this writer "I do not care what you say I am not going back to my room". Pt was able to be verbally deescalated and went back to room and came out a few minutes later stating he was going to go out those doors over there", pt educated that was dangerous due to EMS's traffic in that area. Pt started to punch his fists and started to yell and state "I am going to have to go back to the [employer] days and I am not scared to take anyone out".   MD at bedside to help deescalate situation and pt refuses to go back to room and this RN offered opportunities to ambulate when this RN had extra time, pt refused and was still agitated and angry.   Meds ordered if pt's violent behavior continues, see MAR for admin.

## 2024-02-21 NOTE — ED Notes (Addendum)
 Pt dinner tray left at bedside. Pt asleep at this moment.

## 2024-02-22 DIAGNOSIS — F03918 Unspecified dementia, unspecified severity, with other behavioral disturbance: Secondary | ICD-10-CM | POA: Diagnosis not present

## 2024-02-22 NOTE — ED Notes (Signed)
 Pt asleep, PM snack placed in rm at this time.

## 2024-02-22 NOTE — Consult Note (Signed)
 [facility] Consult Follow-up  Patient Name: .[name]  MRN: [id number]  DOB: [birth year]  Consult Order details:    Mode of Visit: In person, I spent 35 minutes on this consult    Psychiatry Consult Evaluation  Service Date: February 22, 2024 LOS:  LOS: 0 days  Chief Complaint " I do not really need to be here right now I am just ready to go home"  Primary Psychiatric Diagnoses  Aggressive behavior due to dementia [facility])  Assessment  [name] is a 73 year-old male with a known diagnosis of dementia who is presenting with worsening behavioral symptoms, including verbal threats and physical aggression toward family members. His daughter, who has POA, does not feel safe with him at home. Despite his lack of insight into his behavior, his worsening aggression poses a risk to others in the household.    Diagnoses:  Active Hospital problems: Principal Problem:   Aggressive behavior due to dementia [facility])    Plan   ## Psychiatric Medication Recommendations:  Resume home meds once reconciled  ## Medical Decision Making Capacity: Not specifically addressed in this encounter  ## Further Work-up:  [name] was admitted  for Aggressive behavior due to dementia [facility]), crisis management, and stabilization.  Will maintain observation checks every 15 minutes for safety. Psychosocial education regarding relapse prevention and self-care; social and communication  Social work will consult with family for collateral information and discuss discharge and follow up plan.   ## Disposition:-- Geriatric psych unit to stabilize aggressive behavior.   Recommend discussion with the social work regarding the need for alternative placement.  ## Behavioral / Environmental: -Utilize compassion and acknowledge the patient's experiences while setting clear and realistic expectations for care.    ## Safety and Observation Level:  - Based on my clinical evaluation, I  estimate the patient to be at moderate risk of self harm in the current setting. - At this time, we recommend  routine. This decision is based on my review of the chart including patient's history and current presentation, interview of the patient, mental status examination, and consideration of suicide risk including evaluating suicidal ideation, plan, intent, suicidal or self-harm behaviors, risk factors, and protective factors. This judgment is based on our ability to directly address suicide risk, implement suicide prevention strategies, and develop a safety plan while the patient is in the clinical setting. Please contact our team if there is a concern that risk level has changed.  CSSR Risk Category:C-SSRS RISK CATEGORY: No Risk  Suicide Risk Assessment: Patient has following modifiable risk factors for suicide: recklessness, which we are addressing by recommending inpatient . Patient has following non-modifiable or demographic risk factors for suicide: male gender Patient has the following protective factors against suicide: Supportive family, Supportive friends, Minor children in the home, no history of suicide attempts, and no history of NSSIB  Thank you for this consult request. Recommendations have been communicated to the primary team.  We will recommend inpatient at this time.   Juliann Pares, NP       History of Present Illness  Relevant Aspects of Hospital ED Course:   73 year old male presenting to emergency department at [facility] on 02/21/2024, due to family bring in the patient due to threatening the wife that he was going to hurt her.  Patient is with known dementia diagnosis, it is a safety concern to his family and the family states that they are unable to keep him at home.  Patient at this  time is denying anxiety and depression.  Also denies SI, HI, AVH, SIB.  Patient is not oriented to time place or situation.  Patient was provided a pair of clean scrubs by nursing staff in  which she just put the clean scrubs over the dirty scrubs he is wearing.  Nursing staff to continue monitoring the patient and check on the patient every 15 minutes.  Patient will continue to be recommended for inpatient psych due to aggressive behavior.  Reassessment will be completed on aggressive behavior after a week to determine whether or not the patient can return home or to transition of care.    Psych ROS:  Depression: denies Anxiety:  denies Mania (lifetime and current): denies/never Psychosis: (lifetime and current): denies/never  Collateral information:  Patient's daughter was at his bedside.  She states that she is afraid for her father to be home with her children.    Review of Systems  Psychiatric/Behavioral:  Positive for memory loss. Negative for depression, hallucinations, substance abuse and suicidal ideas. The patient is not nervous/anxious.   All other systems reviewed and are negative.    Psychiatric and Social History  Past Psychiatric History:  Dementia (diagnosed 4 years ago)  No known history of psychiatric hospitalization  No history of suicide attempts or self-harm  Medical History:  Dementia  [Other relevant medical conditions]  Medications:  [List current medications]  Family Psychiatric History:  No known family history of psychiatric illness (or specify if applicable)  Substance Use History:  No reported history of alcohol or illicit drug use    Exam Findings  Physical Exam:  Vital Signs:  Temp:  [98.4 F (36.9 C)-98.6 F (37 C)] 98.6 F (37 C) (03/03 0819) Pulse Rate:  [80-81] 80 (03/03 0819) Resp:  [16-21] 18 (03/03 0819) BP: (135-145)/(55-70) 135/66 (03/03 0819) SpO2:  [93 %-100 %] 100 % (03/03 0819) Blood pressure 135/66, pulse 80, temperature 98.6 F (37 C), temperature source Oral, resp. rate 18, height 5\' 7"  (1.702 m), weight 88 kg, SpO2 100%. Body mass index is 30.39 kg/m.  Physical Exam Vitals and nursing note  reviewed.  Constitutional:      Appearance: Normal appearance.  HENT:     Head: Normocephalic and atraumatic.     Nose: Nose normal.     Mouth/Throat:     Mouth: Mucous membranes are dry.  Eyes:     Pupils: Pupils are equal, round, and reactive to light.  Musculoskeletal:        General: Normal range of motion.     Cervical back: Normal range of motion.  Skin:    General: Skin is dry.  Neurological:     Mental Status: He is alert.  Psychiatric:        Attention and Perception: Attention and perception normal.        Mood and Affect: Mood and affect normal.        Speech: Speech normal.        Behavior: Behavior is cooperative.        Thought Content: Thought content normal.        Cognition and Memory: Cognition is impaired. Memory is impaired. He exhibits impaired recent memory and impaired remote memory.        Judgment: Judgment is impulsive and inappropriate.    Mental Status Exam: General Appearance: Casual  Orientation:  Other:  Self only  Memory:  Negative Immediate;   Poor Recent;   Poor Remote;   Poor/  Concentration:  Concentration:  Fair and Attention Span: Fair  Recall:  Poor  Attention  Good  Eye Contact:  Good  Speech:  Clear and Coherent  Language:  Good  Volume:  Normal  Mood: euthymic  Affect:  Appropriate  Thought Process:  Irrelevant  Thought Content:  WDL  Suicidal Thoughts:  No  Homicidal Thoughts:  No  Judgement:  Impaired  Insight:  Lacking  Psychomotor Activity:  Normal  Akathisia:  No  Fund of Knowledge:  Poor      Assets:  Communication Skills Desire for Improvement Financial Resources/Insurance Housing Social Support  Cognition:  WNL  ADL's:  Intact  AIMS (if indicated):        Other History   These have been pulled in through the EMR, reviewed, and updated if appropriate.  Family History:  The patient's family history includes COPD in his mother; Cancer in his father; Dementia in his father; Diabetes in his father and  mother.  Medical History: Past Medical History:  Diagnosis Date   Acute cerebrovascular accident (CVA) (HCC) 01/20/2020   Benign essential HTN 01/20/2020   Cataract    Mixed form OU   Dementia (HCC)    Diabetic retinopathy (HCC)    NPDR OU   Hypertensive retinopathy    OU   Seizures (HCC)    per pt- "none since teenage years"    Surgical History: Past Surgical History:  Procedure Laterality Date   LOOP RECORDER INSERTION N/A 01/23/2020   Procedure: LOOP RECORDER INSERTION;  Surgeon: Duke Salvia, MD;  Location: [facility] INVASIVE CV LAB;  Service: Cardiovascular;  Laterality: N/A;     Medications:   Current Facility-Administered Medications:    0.9 %  sodium chloride infusion, 500 mL, Intravenous, Once, Armbruster, Willaim Rayas, MD   0.9 %  sodium chloride infusion, 500 mL, Intravenous, Once, Armbruster, Willaim Rayas, MD   atorvastatin (LIPITOR) tablet 40 mg, 40 mg, Oral, q1800, Bradler, Clent Jacks, MD   carvedilol (COREG) tablet 6.25 mg, 6.25 mg, Oral, BID WC, Bradler, Clent Jacks, MD, 6.25 mg at 02/22/24 1610   donepezil (ARICEPT) tablet 10 mg, 10 mg, Oral, QHS, Bradler, Clent Jacks, MD, 10 mg at 02/21/24 2116   furosemide (LASIX) tablet 40 mg, 40 mg, Oral, Daily, Bradler, Clent Jacks, MD, 40 mg at 02/22/24 1018   hydrochlorothiazide (HYDRODIURIL) tablet 25 mg, 25 mg, Oral, Daily, Bradler, Clent Jacks, MD, 25 mg at 02/22/24 1017   latanoprost (XALATAN) 0.005 % ophthalmic solution 1 drop, 1 drop, Both Eyes, QHS, Bradler, Evan K, MD, 1 drop at 02/21/24 2117   losartan (COZAAR) tablet 50 mg, 50 mg, Oral, BID, Merwyn Katos, MD, 50 mg at 02/22/24 1018   memantine (NAMENDA) tablet 10 mg, 10 mg, Oral, BID, Merwyn Katos, MD, 10 mg at 02/22/24 1017   metFORMIN (GLUCOPHAGE) tablet 500 mg, 500 mg, Oral, BID WC, Merwyn Katos, MD, 500 mg at 02/22/24 9604   multivitamin with minerals tablet 1 tablet, 1 tablet, Oral, Daily, Merwyn Katos, MD, 1 tablet at 02/22/24 1017   polyvinyl alcohol (LIQUIFILM TEARS) 1.4 %  ophthalmic solution 1 drop, 1 drop, Both Eyes, q AM, Bradler, Clent Jacks, MD, 1 drop at 02/22/24 0629   potassium chloride SA (KLOR-CON M) CR tablet 20 mEq, 20 mEq, Oral, Daily, Bradler, Clent Jacks, MD, 20 mEq at 02/22/24 1018   QUEtiapine (SEROQUEL) tablet 25 mg, 25 mg, Oral, QHS, Bradler, Clent Jacks, MD, 25 mg at 02/21/24 2116   tamsulosin (FLOMAX) capsule 0.4 mg, 0.4 mg,  Oral, Daily, Merwyn Katos, MD, 0.4 mg at 02/22/24 1017   traZODone (DESYREL) tablet 150 mg, 150 mg, Oral, QHS, Bradler, Clent Jacks, MD, 150 mg at 02/21/24 2116  Current Outpatient Medications:    aspirin 325 MG EC tablet, aspirin 325 mg tablet,delayed release, Disp: , Rfl:    atorvastatin (LIPITOR) 40 MG tablet, Take 1 tablet (40 mg total) by mouth daily at 6 PM., Disp: 30 tablet, Rfl: 2   carvedilol (COREG) 6.25 MG tablet, Take 6.25 mg by mouth 2 (two) times daily with a meal., Disp: , Rfl:    donepezil (ARICEPT) 10 MG tablet, Take 1 tablet (10 mg total) by mouth at bedtime., Disp: 90 tablet, Rfl: 3   furosemide (LASIX) 40 MG tablet, Take 1 tablet (40 mg total) by mouth 2 (two) times daily for 4 days, THEN 1 tablet (40 mg total) daily., Disp: 30 tablet, Rfl: 0   hydrochlorothiazide (HYDRODIURIL) 25 MG tablet, Take 25 mg by mouth daily., Disp: , Rfl:    latanoprost (XALATAN) 0.005 % ophthalmic solution, Place 1 drop into both eyes at bedtime., Disp: , Rfl:    losartan (COZAAR) 50 MG tablet, Take 50 mg by mouth 2 (two) times daily., Disp: , Rfl:    memantine (NAMENDA) 10 MG tablet, Take 1 tablet (10 mg total) by mouth 2 (two) times daily., Disp: 180 tablet, Rfl: 3   metFORMIN (GLUCOPHAGE) 500 MG tablet, Take by mouth 2 (two) times daily with a meal., Disp: , Rfl:    Multiple Vitamins-Minerals (ONE-A-DAY MENS 50+ PO), Take 1 tablet by mouth daily., Disp: , Rfl:    Polyvinyl Alcohol-Povidone (REFRESH OP), Place 1 drop into both eyes in the morning and at bedtime., Disp: , Rfl:    potassium chloride SA (KLOR-CON M) 20 MEQ tablet, Take 20 mEq by  mouth daily., Disp: , Rfl:    QUEtiapine (SEROQUEL) 25 MG tablet, Take 25 mg by mouth at bedtime., Disp: , Rfl:    tamsulosin (FLOMAX) 0.4 MG CAPS capsule, Take 0.4 mg by mouth daily., Disp: , Rfl:    traZODone (DESYREL) 150 MG tablet, Take 1 tablet (150 mg total) by mouth at bedtime., Disp: 30 tablet, Rfl: 1  Allergies: Allergies  Allergen Reactions   Shellfish Allergy Anaphylaxis    Juliann Pares, NP

## 2024-02-22 NOTE — ED Notes (Signed)
 vol/consult done/NP recommended pt for inpatient admission to geropsych for stabilization.

## 2024-02-22 NOTE — ED Notes (Signed)
 Daughter at bedside and requested to speak to LCSW. This RN contacted CW to speak with daughter.

## 2024-02-22 NOTE — ED Notes (Signed)
 Report received from [facility]. Pt resting on stretcher in NAD, respirations even and unlabored

## 2024-02-22 NOTE — ED Notes (Signed)
 Pt up to bathroom. Once back in room, pt noted to have blood stains on L sock. This RN pulled pt's pant leg up and noted wound to shin above ankle, bleeding controlled at this time. Unsure if pt had this prior to assuming care. Asked pt if he hit his leg at some point when he went to bathroom but declines. Wound cleaned, telfa dressing and kerlix wrapped around pt's L shin

## 2024-02-22 NOTE — ED Provider Notes (Signed)
 Emergency Medicine Observation Re-evaluation Note  [name] is a 73 y.o. male, seen on rounds today.  Pt initially presented to the ED for complaints of Psychiatric Evaluation  Currently, the patient is no acute distress. Resting   Physical Exam  Blood pressure (!) 145/55, pulse 80, temperature 98.4 F (36.9 C), temperature source Oral, resp. rate 16, height 5\' 7"  (1.702 m), weight 88 kg, SpO2 99%.  Physical Exam General: No apparent distress Pulm: Normal WOB Psych: resting     ED Course / MDM     I have reviewed the labs performed to date as well as medications administered while in observation.  Recent changes in the last 24 hours include none   Plan   Current plan is to continue to wait for psych plan/placement  Patient is not under full IVC at this time.   Concha Se, MD 02/22/24 [phone]

## 2024-02-23 MED ORDER — OLANZAPINE 10 MG IM SOLR
5.0000 mg | Freq: Once | INTRAMUSCULAR | Status: DC
  Filled 2024-02-23: qty 10

## 2024-02-23 MED ORDER — OLANZAPINE 5 MG PO TABS
5.0000 mg | ORAL_TABLET | Freq: Two times a day (BID) | ORAL | Status: DC | PRN
  Administered 2024-02-23: 5 mg via ORAL
  Filled 2024-02-23: qty 1

## 2024-02-23 MED ORDER — OLANZAPINE 5 MG PO TABS
5.0000 mg | ORAL_TABLET | ORAL | Status: AC
  Administered 2024-02-23: 5 mg via ORAL
  Filled 2024-02-23: qty 1

## 2024-02-23 NOTE — ED Notes (Signed)
 This tech obtained vital signs on pt.

## 2024-02-23 NOTE — ED Provider Notes (Signed)
 Emergency Medicine Observation Re-evaluation Note  [name] is a 73 y.o. male, seen on rounds today.  Pt initially presented to the ED for complaints of Psychiatric Evaluation  Currently, the patient is resting comfortably.  Physical Exam  BP 131/64   Pulse 65   Temp 97.7 F (36.5 C) (Oral)   Resp 18   Ht 5\' 7"  (1.702 m)   Wt 88 kg   SpO2 100%   BMI 30.39 kg/m  General: No acute distress Cardiac: Well-perfused extremities Lungs: No respiratory distress Psych: Appropriate mood and affect  ED Course / MDM  EKG:   I have reviewed the labs performed to date as well as medications administered while in observation.  Recent changes in the last 24 hours include none.  Plan  Current plan is for placement. No IVC at this time   Merwyn Katos, MD 02/23/24 [phone]

## 2024-02-23 NOTE — ED Notes (Signed)
Vol /pending placement 

## 2024-02-23 NOTE — ED Notes (Signed)
 Patient ambulatory to the bathroom in room.

## 2024-02-23 NOTE — Progress Notes (Signed)
 Earlier today when nursing staff notified myself that patient has become more agitated and was threatening the staff that he is going to start punching people like already if no one takes care of him.  For the most Nedra Hai part of the day patient was calm and cooperative but RN Ane Payment reached out to me in regards of his agitation stating this was out of the norm and requests a PRNs.  Patient was provided Zyprexa 5 mg p.o. and after face-to-face patient is is alert and in a pleasant mood.  Patient is remaining in the ED due to placement issues and waiting for inpatient psych bed.  Patient will continue to remain on the unit until inpatient admission accepted.  As needed order for Zyprexa 5 mg as needed twice daily was ordered for agitation in the future.  Patient currently denies SI, HI, SIB, AVH.  Patient states no other needs or concerns at this time.

## 2024-02-23 NOTE — ED Notes (Signed)
 Pt given snack and beverage.

## 2024-02-23 NOTE — ED Notes (Signed)
 Patient repositioned in bed and given a breakfast tray.

## 2024-02-23 NOTE — ED Notes (Signed)
 Pt getting agitated and wanting to leave room, and walk in the hallway. Pt redirected back to room. Pt states "[name] make me go back to my [employer] days".

## 2024-02-23 NOTE — ED Notes (Signed)
Patient received dinner tray at this time.  

## 2024-02-24 MED ORDER — QUETIAPINE FUMARATE 100 MG PO TABS
100.0000 mg | ORAL_TABLET | Freq: Every day | ORAL | Status: DC
  Administered 2024-02-25 – 2024-03-10 (×15): 100 mg via ORAL
  Filled 2024-02-24 (×8): qty 4
  Filled 2024-02-24: qty 1
  Filled 2024-02-24 (×6): qty 4

## 2024-02-24 NOTE — ED Provider Notes (Signed)
 Patient cleared by psychiatry but reportedly family did not want her to go back home therefore social work consult was placed, PT, OT.  Home meds have already been ordered   Concha Se, MD 02/24/24 [phone]
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
## 2024-02-24 NOTE — NC FL2 (Signed)
 [place] MEDICAID FL2 LEVEL OF CARE FORM     IDENTIFICATION  Patient Name: [name] Birthdate: 05-07-1951 Sex: male Admission Date (Current Location): 02/19/2024  [facility] and [place] Number:  Producer, television/film/video and Address:  [facility], [address], [place], [place] [address]      Provider Number: [id number]  Attending Physician Name and Address:  No att. 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) 02/19/2024   Memory change 11/20/2020   Acute cerebrovascular accident (CVA) (HCC) 01/20/2020   Benign essential HTN 01/20/2020   Left homonymous hemianopsia 12/23/2019    Orientation RESPIRATION BLADDER Height & Weight     Self, Situation, Place  Normal Continent Weight: 194 lb 0.1 oz (88 kg) Height:  5\' 7"  (170.2 cm)  BEHAVIORAL SYMPTOMS/MOOD NEUROLOGICAL BOWEL NUTRITION STATUS   (Cooperative, calm.)   Continent Diet (Regular)  AMBULATORY STATUS COMMUNICATION OF NEEDS Skin   Supervision Verbally Normal                       Personal Care Assistance Level of Assistance  Bathing, Feeding, Dressing Bathing Assistance:  (Supervision) Feeding assistance:  (Supervision) Dressing Assistance:  (Supervision)     Functional Limitations Info  Sight, Speech, Hearing Sight Info: Adequate Hearing Info: Adequate Speech Info: Adequate    SPECIAL CARE FACTORS FREQUENCY                       Contractures Contractures Info: Not present    Additional Factors Info  Code Status, Allergies Code Status Info: Full code Allergies Info: Shellfish Allergy           Current Medications (02/24/2024):  This is the  current hospital active medication list Current Facility-Administered Medications  Medication Dose Route Frequency Provider Last Rate Last Admin   0.9 %  sodium chloride infusion  500 mL Intravenous Once Armbruster, Willaim Rayas, MD       0.9 %  sodium chloride infusion  500 mL Intravenous Once Armbruster, Willaim Rayas, MD       atorvastatin (LIPITOR) tablet 40 mg  40 mg Oral q1800 Merwyn Katos, MD   40 mg at 02/23/24 1714   carvedilol (COREG) tablet 6.25 mg  6.25 mg Oral BID WC Merwyn Katos, MD   6.25 mg at 02/24/24 0834   donepezil (ARICEPT) tablet 10 mg  10 mg Oral QHS Merwyn Katos, MD   10 mg at 02/23/24 2135   furosemide (LASIX) tablet 40 mg  40 mg Oral Daily Merwyn Katos, MD   40 mg at 02/24/24 0948   hydrochlorothiazide (HYDRODIURIL) tablet 25 mg  25 mg Oral Daily Merwyn Katos, MD   25 mg at 02/24/24 0947   latanoprost (XALATAN) 0.005 % ophthalmic solution 1 drop  1 drop Both Eyes QHS Merwyn Katos, MD   1 drop at 02/23/24 2136   losartan (COZAAR) tablet 50 mg  50 mg Oral BID Merwyn Katos, MD   50 mg at 02/24/24 0947   memantine (NAMENDA) tablet 10 mg  10 mg Oral BID Merwyn Katos, MD  10 mg at 02/24/24 0948   metFORMIN (GLUCOPHAGE) tablet 500 mg  500 mg Oral BID WC Merwyn Katos, MD   500 mg at 02/24/24 2130   multivitamin with minerals tablet 1 tablet  1 tablet Oral Daily Merwyn Katos, MD   1 tablet at 02/24/24 8657   polyvinyl alcohol (LIQUIFILM TEARS) 1.4 % ophthalmic solution 1 drop  1 drop Both Eyes q AM Merwyn Katos, MD   1 drop at 02/24/24 0835   potassium chloride SA (KLOR-CON M) CR tablet 20 mEq  20 mEq Oral Daily Merwyn Katos, MD   20 mEq at 02/24/24 0947   QUEtiapine (SEROQUEL) tablet 100 mg  100 mg Oral QHS Saucier, Jerlyn Ly, NP       tamsulosin [facility]) capsule 0.4 mg  0.4 mg Oral Daily Merwyn Katos, MD   0.4 mg at 02/24/24 0947   traZODone (DESYREL) tablet 150 mg  150 mg Oral QHS Merwyn Katos, MD   150 mg at 02/23/24 2135   Current  Outpatient Medications  Medication Sig Dispense Refill   aspirin 325 MG EC tablet aspirin 325 mg tablet,delayed release     atorvastatin (LIPITOR) 40 MG tablet Take 1 tablet (40 mg total) by mouth daily at 6 PM. 30 tablet 2   carvedilol (COREG) 6.25 MG tablet Take 6.25 mg by mouth 2 (two) times daily with a meal.     donepezil (ARICEPT) 10 MG tablet Take 1 tablet (10 mg total) by mouth at bedtime. 90 tablet 3   furosemide (LASIX) 40 MG tablet Take 1 tablet (40 mg total) by mouth 2 (two) times daily for 4 days, THEN 1 tablet (40 mg total) daily. 30 tablet 0   hydrochlorothiazide (HYDRODIURIL) 25 MG tablet Take 25 mg by mouth daily.     latanoprost (XALATAN) 0.005 % ophthalmic solution Place 1 drop into both eyes at bedtime.     losartan (COZAAR) 50 MG tablet Take 50 mg by mouth 2 (two) times daily.     memantine (NAMENDA) 10 MG tablet Take 1 tablet (10 mg total) by mouth 2 (two) times daily. 180 tablet 3   metFORMIN (GLUCOPHAGE) 500 MG tablet Take by mouth 2 (two) times daily with a meal.     Multiple Vitamins-Minerals (ONE-A-DAY MENS 50+ PO) Take 1 tablet by mouth daily.     Polyvinyl Alcohol-Povidone (REFRESH OP) Place 1 drop into both eyes in the morning and at bedtime.     potassium chloride SA (KLOR-CON M) 20 MEQ tablet Take 20 mEq by mouth daily.     QUEtiapine (SEROQUEL) 25 MG tablet Take 25 mg by mouth at bedtime.     tamsulosin (FLOMAX) 0.4 MG CAPS capsule Take 0.4 mg by mouth daily.     traZODone (DESYREL) 150 MG tablet Take 1 tablet (150 mg total) by mouth at bedtime. 30 tablet 1     Discharge Medications: Please see discharge summary for a list of discharge medications.  Relevant Imaging Results:  Relevant Lab Results:   Additional Information SS#: [id number]  Margarito Liner, LCSW
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
## 2024-02-24 NOTE — ED Notes (Signed)
 Pt provided with dinner tray.

## 2024-02-24 NOTE — ED Notes (Signed)
 VOL/ TOC

## 2024-02-24 NOTE — BH Assessment (Signed)
 Per Psych NP, pt has been psych cleared. Referred to TOC/Social Work

## 2024-02-24 NOTE — ED Provider Notes (Signed)
 Emergency Medicine Observation Re-evaluation Note  Klye Besecker is a 73 y.o. male, seen on rounds today.  Pt initially presented to the ED for complaints of Psychiatric Evaluation Currently, the patient is resting.  Physical Exam  BP (!) 144/65 (BP Location: Left Arm)   Pulse 60   Temp 98 F (36.7 C) (Oral)   Resp 18   Ht 1.702 m (5\' 7" )   Wt 88 kg   SpO2 100%   BMI 30.39 kg/m  Physical Exam Gen:  No acute distress Resp:  Breathing easily and comfortably, no accessory muscle usage Neuro:  Moving all four extremities, no gross focal neuro deficits Psych:  Resting currently, calm when awake ED Course / MDM  EKG:   I have reviewed the labs performed to date as well as medications administered while in observation.  Recent changes in the last 24 hours include no significant changes.  Plan  Current plan is for placement.    Loleta Rose, MD 02/24/24 [phone]

## 2024-02-24 NOTE — Consult Note (Signed)
 [place] [facility] Consult Follow-up  Patient Name: .[name]  MRN: [id number]  DOB: December 17, 1951  Consult Order details:    Mode of Visit: In person, I spent 35 minutes on this consult, 15 minutes shared with Dr. Enedina Finner my supervising    Psychiatry Consult Evaluation  Service Date: February 24, 2024 LOS:  LOS: 0 days  Chief Complaint " I do not really need to be here right now I am just ready to go home"  Primary Psychiatric Diagnoses  Aggressive behavior due to dementia [facility])  Assessment  [name] is a 73 year-old male with a known diagnosis of dementia who is presenting with worsening behavioral symptoms, including verbal threats and physical aggression toward family members. His daughter, who has POA, does not feel safe with him at home. Despite his lack of insight into his behavior, his worsening aggression poses a risk to others in the household.    Diagnoses:  Active Hospital problems: Principal Problem:   Aggressive behavior due to dementia [facility])    Plan   ## Psychiatric Medication Recommendations:  -Stop Zyprexa 5 mg p.o. twice a day as needed for agitation -Increase Seroquel to 100 mg at night daily by mouth  ## Medical Decision Making Capacity: Not specifically addressed in this encounter  ## Further Work-up:  [name] was admitted  for Aggressive behavior due to dementia [facility]), crisis management, and stabilization.  Will maintain observation checks every 15 minutes for safety. Psychosocial education regarding relapse prevention and self-care; social and communication  Social work will consult with family for collateral information and discuss discharge and follow up plan.   ## Disposition:--Psychiatric cleared due to neurocognitive source and recommended for TOC placement  ## Behavioral / Environmental: -Utilize compassion and acknowledge the patient's experiences while setting clear and realistic expectations for care.  Thank you for  this consult request. Recommendations have been communicated to the primary team.  We psych clear and recommend for TOC to find placement at this time.   Juliann Pares, NP       History of Present Illness  Relevant Aspects of Hospital ED Course:   73 year old male presenting to emergency department at [facility] on 02/21/2024, due to family bring in the patient due to threatening the wife that he was going to hurt her.  Patient is with known dementia diagnosis, it is a safety concern to his family and the family states that they are unable to keep him at home.  Patient at this time is denying anxiety and depression.  Also denies SI, HI, AVH, SIB.  Patient is not oriented to time place or situation.  Due to patient being at the emergency department for extended mount time with no Geri beds available, after sharing this case with Dr. Enedina Finner it is determined that the patient psychiatric cleared due to the fact that this is a neurocognitive source and has been showing signs of aggression was easily redirectable without getting physical.  Due to the patient not getting agitated and acting physically on these agitations the patient is recommended for medication adjustments and psych clear, with TOC placement.  TOC placement is required due to the family stating that they do not feel safe with him due to his aggressive posturing at home, with reports that he has not physically gotten violent with them.  At this time for medication adjustments we are going to be increasing Seroquel to 100 mg nightly and removing Zyprexa 5 mg for long-term considerations in the community.  Psych ROS:  Depression: denies Anxiety:  denies Mania (lifetime and current): denies/never Psychosis: (lifetime and current): denies/never  Collateral information:  Patient's daughter was at his bedside.  She states that she is afraid for her father to be home with her children.    Review of Systems  Psychiatric/Behavioral:  Positive for  memory loss. Negative for depression, hallucinations, substance abuse and suicidal ideas. The patient is not nervous/anxious.   All other systems reviewed and are negative.    Psychiatric and Social History  Past Psychiatric History:  Dementia (diagnosed 4 years ago)  No known history of psychiatric hospitalization  No history of suicide attempts or self-harm  Medical History:  Dementia  [Other relevant medical conditions]  Medications:  [List current medications]  Family Psychiatric History:  No known family history of psychiatric illness (or specify if applicable)  Substance Use History:  No reported history of alcohol or illicit drug use    Exam Findings  Physical Exam:  Vital Signs:  Temp:  [97.9 F (36.6 C)-98 F (36.7 C)] 97.9 F (36.6 C) (03/05 0742) Pulse Rate:  [60-70] 70 (03/05 0742) Resp:  [15-18] 15 (03/05 0742) BP: (129-144)/(65-66) 129/66 (03/05 0742) SpO2:  [97 %-100 %] 97 % (03/05 0742) Blood pressure 129/66, pulse 70, temperature 97.9 F (36.6 C), temperature source Oral, resp. rate 15, height 5\' 7"  (1.702 m), weight 88 kg, SpO2 97%. Body mass index is 30.39 kg/m.  Physical Exam Vitals and nursing note reviewed.  Constitutional:      Appearance: Normal appearance.  HENT:     Head: Normocephalic and atraumatic.     Nose: Nose normal.     Mouth/Throat:     Mouth: Mucous membranes are dry.  Eyes:     Pupils: Pupils are equal, round, and reactive to light.  Musculoskeletal:        General: Normal range of motion.     Cervical back: Normal range of motion.  Skin:    General: Skin is dry.  Neurological:     Mental Status: He is alert.  Psychiatric:        Attention and Perception: Attention and perception normal.        Mood and Affect: Mood and affect normal.        Speech: Speech normal.        Behavior: Behavior is cooperative.        Thought Content: Thought content normal.        Cognition and Memory: Cognition is impaired.  Memory is impaired. He exhibits impaired recent memory and impaired remote memory.        Judgment: Judgment is impulsive and inappropriate.    Mental Status Exam: General Appearance: Casual  Orientation:  Other:  Self only  Memory:  Negative Immediate;   Poor Recent;   Poor Remote;   Poor/  Concentration:  Concentration: Fair and Attention Span: Fair  Recall:  Poor  Attention  Good  Eye Contact:  Good  Speech:  Clear and Coherent  Language:  Good  Volume:  Normal  Mood: euthymic  Affect:  Appropriate  Thought Process:  Irrelevant  Thought Content:  WDL  Suicidal Thoughts:  No  Homicidal Thoughts:  No  Judgement:  Impaired  Insight:  Lacking  Psychomotor Activity:  Normal  Akathisia:  No  Fund of Knowledge:  Poor      Assets:  Communication Skills Desire for Improvement Financial Resources/Insurance Housing Social Support  Cognition:  WNL  ADL's:  Intact  AIMS (  if indicated):        Other History   These have been pulled in through the EMR, reviewed, and updated if appropriate.  Family History:  The patient's family history includes COPD in his mother; Cancer in his father; Dementia in his father; Diabetes in his father and mother.  Medical History: Past Medical History:  Diagnosis Date   Acute cerebrovascular accident (CVA) (HCC) 01/20/2020   Benign essential HTN 01/20/2020   Cataract    Mixed form OU   Dementia (HCC)    Diabetic retinopathy (HCC)    NPDR OU   Hypertensive retinopathy    OU   Seizures (HCC)    per pt- "none since teenage years"    Surgical History: Past Surgical History:  Procedure Laterality Date   LOOP RECORDER INSERTION N/A 01/23/2020   Procedure: LOOP RECORDER INSERTION;  Surgeon: Duke Salvia, MD;  Location: [facility] INVASIVE CV LAB;  Service: Cardiovascular;  Laterality: N/A;     Medications:   Current Facility-Administered Medications:    0.9 %  sodium chloride infusion, 500 mL, Intravenous, Once, Armbruster, Willaim Rayas, MD    0.9 %  sodium chloride infusion, 500 mL, Intravenous, Once, Armbruster, Willaim Rayas, MD   atorvastatin (LIPITOR) tablet 40 mg, 40 mg, Oral, q1800, Merwyn Katos, MD, 40 mg at 02/23/24 1714   carvedilol (COREG) tablet 6.25 mg, 6.25 mg, Oral, BID WC, Bradler, Clent Jacks, MD, 6.25 mg at 02/24/24 0834   donepezil (ARICEPT) tablet 10 mg, 10 mg, Oral, QHS, Bradler, Clent Jacks, MD, 10 mg at 02/23/24 2135   furosemide (LASIX) tablet 40 mg, 40 mg, Oral, Daily, Bradler, Clent Jacks, MD, 40 mg at 02/24/24 0948   hydrochlorothiazide (HYDRODIURIL) tablet 25 mg, 25 mg, Oral, Daily, Bradler, Clent Jacks, MD, 25 mg at 02/24/24 0947   latanoprost (XALATAN) 0.005 % ophthalmic solution 1 drop, 1 drop, Both Eyes, QHS, Bradler, Clent Jacks, MD, 1 drop at 02/23/24 2136   losartan (COZAAR) tablet 50 mg, 50 mg, Oral, BID, Merwyn Katos, MD, 50 mg at 02/24/24 0947   memantine (NAMENDA) tablet 10 mg, 10 mg, Oral, BID, Merwyn Katos, MD, 10 mg at 02/24/24 0948   metFORMIN (GLUCOPHAGE) tablet 500 mg, 500 mg, Oral, BID WC, Merwyn Katos, MD, 500 mg at 02/24/24 7829   multivitamin with minerals tablet 1 tablet, 1 tablet, Oral, Daily, Merwyn Katos, MD, 1 tablet at 02/24/24 0948   polyvinyl alcohol (LIQUIFILM TEARS) 1.4 % ophthalmic solution 1 drop, 1 drop, Both Eyes, q AM, Bradler, Clent Jacks, MD, 1 drop at 02/24/24 0835   potassium chloride SA (KLOR-CON M) CR tablet 20 mEq, 20 mEq, Oral, Daily, Bradler, Clent Jacks, MD, 20 mEq at 02/24/24 0947   QUEtiapine (SEROQUEL) tablet 100 mg, 100 mg, Oral, QHS, Tenille Morrill, Jerlyn Ly, NP   tamsulosin (FLOMAX) capsule 0.4 mg, 0.4 mg, Oral, Daily, Bradler, Clent Jacks, MD, 0.4 mg at 02/24/24 0947   traZODone (DESYREL) tablet 150 mg, 150 mg, Oral, QHS, Bradler, Clent Jacks, MD, 150 mg at 02/23/24 2135  Current Outpatient Medications:    aspirin 325 MG EC tablet, aspirin 325 mg tablet,delayed release, Disp: , Rfl:    atorvastatin (LIPITOR) 40 MG tablet, Take 1 tablet (40 mg total) by mouth daily at 6 PM., Disp: 30 tablet,  Rfl: 2   carvedilol (COREG) 6.25 MG tablet, Take 6.25 mg by mouth 2 (two) times daily with a meal., Disp: , Rfl:    donepezil (ARICEPT) 10 MG tablet, Take 1 tablet (  10 mg total) by mouth at bedtime., Disp: 90 tablet, Rfl: 3   furosemide (LASIX) 40 MG tablet, Take 1 tablet (40 mg total) by mouth 2 (two) times daily for 4 days, THEN 1 tablet (40 mg total) daily., Disp: 30 tablet, Rfl: 0   hydrochlorothiazide (HYDRODIURIL) 25 MG tablet, Take 25 mg by mouth daily., Disp: , Rfl:    latanoprost (XALATAN) 0.005 % ophthalmic solution, Place 1 drop into both eyes at bedtime., Disp: , Rfl:    losartan (COZAAR) 50 MG tablet, Take 50 mg by mouth 2 (two) times daily., Disp: , Rfl:    memantine (NAMENDA) 10 MG tablet, Take 1 tablet (10 mg total) by mouth 2 (two) times daily., Disp: 180 tablet, Rfl: 3   metFORMIN (GLUCOPHAGE) 500 MG tablet, Take by mouth 2 (two) times daily with a meal., Disp: , Rfl:    Multiple Vitamins-Minerals (ONE-A-DAY MENS 50+ PO), Take 1 tablet by mouth daily., Disp: , Rfl:    Polyvinyl Alcohol-Povidone (REFRESH OP), Place 1 drop into both eyes in the morning and at bedtime., Disp: , Rfl:    potassium chloride SA (KLOR-CON M) 20 MEQ tablet, Take 20 mEq by mouth daily., Disp: , Rfl:    QUEtiapine (SEROQUEL) 25 MG tablet, Take 25 mg by mouth at bedtime., Disp: , Rfl:    tamsulosin (FLOMAX) 0.4 MG CAPS capsule, Take 0.4 mg by mouth daily., Disp: , Rfl:    traZODone (DESYREL) 150 MG tablet, Take 1 tablet (150 mg total) by mouth at bedtime., Disp: 30 tablet, Rfl: 1  Allergies: Allergies  Allergen Reactions   Shellfish Allergy Anaphylaxis    Juliann Pares, NP

## 2024-02-24 NOTE — ED Notes (Signed)
 Patient pleasant and cooperative for VS check. No needs expressed at this time.

## 2024-02-24 NOTE — ED Notes (Signed)
Pt given PM snack at this time.  

## 2024-02-24 NOTE — ED Notes (Signed)
 Patient meal tray provided and eaten.

## 2024-02-25 NOTE — ED Notes (Signed)
 Pt given meal at this time by EDT.

## 2024-02-25 NOTE — ED Provider Notes (Signed)
 Emergency Medicine Observation Re-evaluation Note  Physical Exam   BP (!) 148/81   Pulse 68   Temp 97.9 F (36.6 C) (Oral)   Resp 14   Ht 5\' 7"  (1.702 m)   Wt 88 kg   SpO2 98%   BMI 30.39 kg/m   Patient appears in no acute distress.  ED Course / MDM   No reported events during my shift at the time of this note.   Pt is awaiting dispo from consultants   Pilar Jarvis MD    Pilar Jarvis, MD 02/25/24 [phone]

## 2024-02-25 NOTE — ED Notes (Signed)
 Meds given.

## 2024-02-25 NOTE — ED Notes (Signed)
 Pt provided with dinner tray. Pt sitting up eating

## 2024-02-25 NOTE — ED Notes (Signed)
 Pt ambulated to bathroom to preform ADL's no assistance required

## 2024-02-25 NOTE — ED Notes (Signed)
 Hospital meal provided, pt tolerated w/o complaints.  Waste discarded appropriately.

## 2024-02-25 NOTE — ED Notes (Signed)
 This NT asked pt if they would like to shower. Pt declined. Pt resting comfortably at this time after returning from restroom.

## 2024-02-26 ENCOUNTER — Emergency Department

## 2024-02-26 MED ORDER — TUBERCULIN PPD 5 UNIT/0.1ML ID SOLN
5.0000 [IU] | INTRADERMAL | Status: AC
  Administered 2024-02-26: 5 [IU] via INTRADERMAL
  Filled 2024-02-26 (×2): qty 0.1

## 2024-02-26 NOTE — ED Notes (Signed)
Pt sleeping. Lunch tray at bedside.

## 2024-02-26 NOTE — ED Notes (Signed)
vol/toc placement.. 

## 2024-02-26 NOTE — Progress Notes (Signed)
 Mobility Specialist - Progress Note     02/26/24 1216  Mobility  Activity Ambulated independently in hallway;Stood at bedside;Dangled on edge of bed  Level of Assistance Modified independent, requires aide device or extra time  Assistive Device Front wheel walker  Distance Ambulated (ft) 250 ft  Range of Motion/Exercises Active  Activity Response Tolerated well  Mobility Referral Yes  Mobility visit 1 Mobility  Mobility Specialist Start Time (ACUTE ONLY) 1203  Mobility Specialist Stop Time (ACUTE ONLY) 1217  Mobility Specialist Time Calculation (min) (ACUTE ONLY) 14 min   Pt resting in bed on RA upon entry. Pt STS and ambulates to hallway around NS ModI with RW. Pt was pleasant and agreeable throughout the session. Pt returned to bed and left with needs in reach.   Johnathan Hausen Mobility Specialist 02/26/24, 12:18 PM

## 2024-02-26 NOTE — ED Notes (Addendum)
 Social work at the bedside for pt interview with possible placement opportunity

## 2024-02-26 NOTE — ED Notes (Addendum)
 Pt got up to use Commode in room. Refused to wash up and change back off to sleep

## 2024-02-26 NOTE — ED Notes (Signed)
 Breakfast tray provided with water to drink

## 2024-02-26 NOTE — ED Provider Notes (Signed)
-----------------------------------------   6:26 AM on 02/26/2024 -----------------------------------------   Blood pressure 138/64, pulse 60, temperature (!) 97.4 F (36.3 C), temperature source Oral, resp. rate 18, height 5\' 7"  (1.702 m), weight 88 kg, SpO2 100%.  The patient is calm and cooperative at this time.  There have been no acute events since the last update.  Awaiting disposition plan from Social Work team.   Irean Hong, MD 02/26/24 [phone]

## 2024-02-26 NOTE — ED Notes (Signed)
 PPD done on 03/07 at 2115.  R middle forearm, site is circled for easier identification.  lot number [id number] exp 10/22/2026

## 2024-02-26 NOTE — TOC Progression Note (Addendum)
 Transition of Care [facility]) - Progression Note    Patient Details  Name: [name] MRN: [id number] Date of Birth: 1951-12-20  Transition of [facility]) CM/SW Contact  Margarito Liner, LCSW Phone Number: 02/26/2024, 10:39 AM  Clinical Narrative:   [place] confirmed family came to tour. Will determine date/time for assessment through [facility] or Teams.  10:58 am: Will complete assessment on Teams at 12:00. RN is aware.  11:44 pm: Updated patient.  12:28 pm: Assessment completed via [employer] They will follow up with CSW about what they need.  2:39 pm: CSW sent secure chat to MD and RN requesting TB skin test per facility request. Left voicemail for daughter.  4:10 pm: Received call back from daughter. Provided update. She said either the facility will arrange transport or she will pick him up at discharge.  Expected Discharge Plan: Memory Care Barriers to Discharge: Continued Medical Work up  Expected Discharge Plan and Services     Post Acute Care Choice:  (Memory Care) Living arrangements for the past 2 months: Single Family Home                                       Social Determinants of Health (SDOH) Interventions SDOH Screenings   Tobacco Use: Low Risk  (01/06/2024)    Readmission Risk Interventions     No data to display

## 2024-02-27 ENCOUNTER — Emergency Department

## 2024-02-27 ENCOUNTER — Observation Stay

## 2024-02-27 DIAGNOSIS — R55 Syncope and collapse: Secondary | ICD-10-CM | POA: Insufficient documentation

## 2024-02-27 DIAGNOSIS — F03918 Unspecified dementia, unspecified severity, with other behavioral disturbance: Secondary | ICD-10-CM | POA: Diagnosis not present

## 2024-02-27 LAB — URINALYSIS, ROUTINE W REFLEX MICROSCOPIC
Bilirubin Urine: NEGATIVE
Glucose, UA: NEGATIVE mg/dL
Hgb urine dipstick: NEGATIVE
Ketones, ur: NEGATIVE mg/dL
Leukocytes,Ua: NEGATIVE
Nitrite: NEGATIVE
Protein, ur: NEGATIVE mg/dL
Specific Gravity, Urine: 1.017 (ref 1.005–1.030)
pH: 6 (ref 5.0–8.0)

## 2024-02-27 LAB — COMPREHENSIVE METABOLIC PANEL
ALT: 12 U/L (ref 0–44)
AST: 20 U/L (ref 15–41)
Albumin: 3.4 g/dL — ABNORMAL LOW (ref 3.5–5.0)
Alkaline Phosphatase: 41 U/L (ref 38–126)
Anion gap: 8 (ref 5–15)
BUN: 41 mg/dL — ABNORMAL HIGH (ref 8–23)
CO2: 31 mmol/L (ref 22–32)
Calcium: 9.4 mg/dL (ref 8.9–10.3)
Chloride: 98 mmol/L (ref 98–111)
Creatinine, Ser: 1.62 mg/dL — ABNORMAL HIGH (ref 0.61–1.24)
GFR, Estimated: 45 mL/min — ABNORMAL LOW (ref >60)
Glucose, Bld: 123 mg/dL — ABNORMAL HIGH (ref 70–99)
Potassium: 4.2 mmol/L (ref 3.5–5.1)
Sodium: 137 mmol/L (ref 135–145)
Total Bilirubin: 0.6 mg/dL (ref 0.0–1.2)
Total Protein: 6.9 g/dL (ref 6.5–8.1)

## 2024-02-27 LAB — CBC WITH DIFFERENTIAL/PLATELET
Abs Immature Granulocytes: 0.02 10*3/uL (ref 0.00–0.07)
Basophils Absolute: 0 10*3/uL (ref 0.0–0.1)
Basophils Relative: 0 %
Eosinophils Absolute: 0.1 10*3/uL (ref 0.0–0.5)
Eosinophils Relative: 1 %
HCT: 37.4 % — ABNORMAL LOW (ref 39.0–52.0)
Hemoglobin: 12.4 g/dL — ABNORMAL LOW (ref 13.0–17.0)
Immature Granulocytes: 0 %
Lymphocytes Relative: 30 %
Lymphs Abs: 1.9 10*3/uL (ref 0.7–4.0)
MCH: 29.6 pg (ref 26.0–34.0)
MCHC: 33.2 g/dL (ref 30.0–36.0)
MCV: 89.3 fL (ref 80.0–100.0)
Monocytes Absolute: 0.5 10*3/uL (ref 0.1–1.0)
Monocytes Relative: 7 %
Neutro Abs: 3.9 10*3/uL (ref 1.7–7.7)
Neutrophils Relative %: 62 %
Platelets: 219 10*3/uL (ref 150–400)
RBC: 4.19 MIL/uL — ABNORMAL LOW (ref 4.22–5.81)
RDW: 13 % (ref 11.5–15.5)
WBC: 6.4 10*3/uL (ref 4.0–10.5)
nRBC: 0 % (ref 0.0–0.2)

## 2024-02-27 LAB — RESP PANEL BY RT-PCR (RSV, FLU A&B, COVID)  RVPGX2
Influenza A by PCR: NEGATIVE
Influenza B by PCR: NEGATIVE
Resp Syncytial Virus by PCR: NEGATIVE
SARS Coronavirus 2 by RT PCR: NEGATIVE

## 2024-02-27 LAB — CBG MONITORING, ED: Glucose-Capillary: 86 mg/dL (ref 70–99)

## 2024-02-27 LAB — LACTIC ACID, PLASMA
Lactic Acid, Venous: 1.2 mmol/L (ref 0.5–1.9)
Lactic Acid, Venous: 0.9 mmol/L (ref 0.5–1.9)

## 2024-02-27 LAB — TROPONIN I (HIGH SENSITIVITY)
Troponin I (High Sensitivity): 12 ng/L (ref <18)
Troponin I (High Sensitivity): 13 ng/L (ref <18)

## 2024-02-27 LAB — HEMOGLOBIN A1C
Hgb A1c MFr Bld: 5.8 % — ABNORMAL HIGH (ref 4.8–5.6)
Mean Plasma Glucose: 119.76 mg/dL

## 2024-02-27 MED ORDER — SODIUM CHLORIDE 0.9 % IV BOLUS
1000.0000 mL | Freq: Once | INTRAVENOUS | Status: AC
  Administered 2024-02-27: 1000 mL via INTRAVENOUS

## 2024-02-27 MED ORDER — SODIUM CHLORIDE 0.9 % IV SOLN: INTRAVENOUS | Status: DC

## 2024-02-27 MED ORDER — SODIUM CHLORIDE 0.9% FLUSH
3.0000 mL | Freq: Two times a day (BID) | INTRAVENOUS | Status: DC
  Administered 2024-02-27 – 2024-03-07 (×18): 3 mL via INTRAVENOUS

## 2024-02-27 MED ORDER — HYDRALAZINE HCL 20 MG/ML IJ SOLN: 5.0000 mg | Freq: Four times a day (QID) | INTRAMUSCULAR | Status: DC | PRN

## 2024-02-27 MED ORDER — ENOXAPARIN SODIUM 40 MG/0.4ML IJ SOSY
40.0000 mg | PREFILLED_SYRINGE | INTRAMUSCULAR | Status: DC
  Administered 2024-02-27 – 2024-03-10 (×13): 40 mg via SUBCUTANEOUS
  Filled 2024-02-27 (×13): qty 0.4

## 2024-02-27 MED ORDER — OLANZAPINE 10 MG IM SOLR
2.5000 mg | Freq: Four times a day (QID) | INTRAMUSCULAR | Status: DC | PRN
  Administered 2024-02-28 (×2): 2.5 mg via INTRAMUSCULAR
  Filled 2024-02-27 (×4): qty 10

## 2024-02-27 MED ORDER — AMLODIPINE BESYLATE 5 MG PO TABS: 5.0000 mg | ORAL_TABLET | Freq: Every day | ORAL | Status: DC

## 2024-02-27 NOTE — H&P (Addendum)
 History and Physical    [name] VHQ:[id number] DOB: [birth year] DOA: 02/19/2024  PCP: Raymon Mutton., FNP (Confirm with patient/family/NH records and if not entered, this has to be entered at [facility] point of entry) Patient coming from: Home   I have personally briefly reviewed patient's old medical records in [facility]  Chief Complaint: I feel fine  HPI: [name] is a 73 y.o. male with medical history significant of cryptogenic stroke on loop recorder, CVA, dementia with behavioral disturbance, HTN, IIDM, DM neuropathy, BPH, who was found to have syncope episode x 3 during ED stay.  Patient initially brought to ED by family member for evaluation of worsening of agitation and mental declining on February 28.  Subsequently patient has been staying in the ED for the last 10+ days and unable to discharge back home due to increasing risk of mentation deterioration.    Last 2 days, ED physician witnessed at least 3 episodes of syncope.  Unfortunately patient was not on telemonitoring as time but ED staff reported that the patient suddenly became unresponsive symptoms lasted for about 10 seconds and patient recovers consciousness.  Patient does not remember any prodromes however due to ED record, patient did complain about lightheadedness before he lost consciousness.  No seizure activity reported.  ED workup showed hemoglobin 12.4, WBC 6.4, creatinine 1.3 compared to baseline 1.1-1.3, BUN 41  Patient also complaining about right-sided abdominal right hip and right knee pain, ED did extensive x-ray showed no fracture or dislocation.  Patient denied any abdominal pain no dysuria or back pain.   Review of Systems: Unable to perform, patient is baseline demented  Past Medical History:  Diagnosis Date   Acute cerebrovascular accident (CVA) (HCC) 01/20/2020   Benign essential HTN 01/20/2020   Cataract    Mixed form OU   Dementia (HCC)    Diabetic retinopathy (HCC)    NPDR OU    Hypertensive retinopathy    OU   Seizures (HCC)    per pt- "none since teenage years"    Past Surgical History:  Procedure Laterality Date   LOOP RECORDER INSERTION N/A 01/23/2020   Procedure: LOOP RECORDER INSERTION;  Surgeon: Duke Salvia, MD;  Location: [facility] INVASIVE CV LAB;  Service: Cardiovascular;  Laterality: N/A;     reports that he has never smoked. He has never used smokeless tobacco. He reports that he does not drink alcohol and does not use drugs.  Allergies  Allergen Reactions   Shellfish Allergy Anaphylaxis    Family History  Problem Relation Age of Onset   COPD Mother    Diabetes Mother    Dementia Father    Diabetes Father    Cancer Father        prostate and lung   Colon cancer Neg Hx    Esophageal cancer Neg Hx    Stomach cancer Neg Hx    Rectal cancer Neg Hx      Prior to Admission medications   Medication Sig Start Date End Date Taking? Authorizing Provider  aspirin 325 MG EC tablet aspirin 325 mg tablet,delayed release 01/24/20  Yes [provider]  atorvastatin (LIPITOR) 40 MG tablet Take 1 tablet (40 mg total) by mouth daily at 6 PM. 01/23/20  Yes Noralee Stain, DO  carvedilol (COREG) 6.25 MG tablet Take 6.25 mg by mouth 2 (two) times daily with a meal.   Yes [provider]  donepezil (ARICEPT) 10 MG tablet Take 1 tablet (10 mg total) by  mouth at bedtime. 09/16/23  Yes McCue, Shanda Bumps, NP  furosemide (LASIX) 40 MG tablet Take 1 tablet (40 mg total) by mouth 2 (two) times daily for 4 days, THEN 1 tablet (40 mg total) daily. 05/01/23 02/20/24 Yes Rondel Baton, MD  hydrochlorothiazide (HYDRODIURIL) 25 MG tablet Take 25 mg by mouth daily. 03/18/20  Yes [provider]  latanoprost (XALATAN) 0.005 % ophthalmic solution Place 1 drop into both eyes at bedtime.   Yes [provider]  losartan (COZAAR) 50 MG tablet Take 50 mg by mouth 2 (two) times daily. 01/08/24  Yes [provider]  memantine (NAMENDA) 10 MG  tablet Take 1 tablet (10 mg total) by mouth 2 (two) times daily. 11/16/23  Yes McCue, Shanda Bumps, NP  metFORMIN (GLUCOPHAGE) 500 MG tablet Take by mouth 2 (two) times daily with a meal.   Yes [provider]  Multiple Vitamins-Minerals (ONE-A-DAY MENS 50+ PO) Take 1 tablet by mouth daily.   Yes [provider]  Polyvinyl Alcohol-Povidone (REFRESH OP) Place 1 drop into both eyes in the morning and at bedtime.   Yes [provider]  potassium chloride SA (KLOR-CON M) 20 MEQ tablet Take 20 mEq by mouth daily. 02/04/24  Yes [provider]  QUEtiapine (SEROQUEL) 25 MG tablet Take 25 mg by mouth at bedtime. 11/02/23  Yes [provider]  tamsulosin (FLOMAX) 0.4 MG CAPS capsule Take 0.4 mg by mouth daily. 02/16/24  Yes [provider]  traZODone (DESYREL) 150 MG tablet Take 1 tablet (150 mg total) by mouth at bedtime. 11/16/23  Yes Ihor Austin, NP    Physical Exam: Vitals:   02/26/24 2302 02/27/24 0630 02/27/24 0800 02/27/24 0900  BP: 127/78 115/67 123/60 (!) 113/58  Pulse: (!) 59 60 (!) 58   Resp: 18 16 18    Temp: 99 F (37.2 C) 98.3 F (36.8 C)    TempSrc: Oral Oral    SpO2: 92% 94% 100%   Weight:      Height:        Constitutional: NAD, calm, comfortable Vitals:   02/26/24 2302 02/27/24 0630 02/27/24 0800 02/27/24 0900  BP: 127/78 115/67 123/60 (!) 113/58  Pulse: (!) 59 60 (!) 58   Resp: 18 16 18    Temp: 99 F (37.2 C) 98.3 F (36.8 C)    TempSrc: Oral Oral    SpO2: 92% 94% 100%   Weight:      Height:       Eyes: PERRL, lids and conjunctivae normal ENMT: Mucous membranes are moist. Posterior pharynx clear of any exudate or lesions.Normal dentition.  Neck: normal, supple, no masses, no thyromegaly Respiratory: clear to auscultation bilaterally, no wheezing, no crackles. Normal respiratory effort. No accessory muscle use.  Cardiovascular: Regular rate and rhythm, no murmurs / rubs / gallops. No extremity edema. 2+ pedal pulses.  No carotid bruits.  Abdomen: no tenderness, no masses palpated. No hepatosplenomegaly. Bowel sounds positive.  Musculoskeletal: no clubbing / cyanosis. No joint deformity upper and lower extremities. Good ROM, no contractures. Normal muscle tone.  Skin: no rashes, lesions, ulcers. No induration Neurologic: CN 2-12 grossly intact. Sensation intact, DTR normal. Strength 5/5 in all 4.  Psychiatric: Awake, oriented to himself, confused about time and place    Labs on Admission: I have personally reviewed following labs and imaging studies  CBC: Recent Labs  Lab 02/27/24 0627  WBC 6.4  NEUTROABS 3.9  HGB 12.4*  HCT 37.4*  MCV 89.3  PLT 219   Basic Metabolic  Panel: Recent Labs  Lab 02/27/24 0627  NA 137  K 4.2  CL 98  CO2 31  GLUCOSE 123*  BUN 41*  CREATININE 1.62*  CALCIUM 9.4   GFR: Estimated Creatinine Clearance: 45 mL/min (A) (by C-G formula based on SCr of 1.62 mg/dL (H)). Liver Function Tests: Recent Labs  Lab 02/27/24 0627  AST 20  ALT 12  ALKPHOS 41  BILITOT 0.6  PROT 6.9  ALBUMIN 3.4*   No results for input(s): "LIPASE", "AMYLASE" in the last 168 hours. No results for input(s): "AMMONIA" in the last 168 hours. Coagulation Profile: No results for input(s): "INR", "PROTIME" in the last 168 hours. Cardiac Enzymes: No results for input(s): "CKTOTAL", "CKMB", "CKMBINDEX", "TROPONINI" in the last 168 hours. BNP (last 3 results) No results for input(s): "PROBNP" in the last 8760 hours. HbA1C: No results for input(s): "HGBA1C" in the last 72 hours. CBG: Recent Labs  Lab 02/27/24 0617  GLUCAP 86   Lipid Profile: No results for input(s): "CHOL", "HDL", "LDLCALC", "TRIG", "CHOLHDL", "LDLDIRECT" in the last 72 hours. Thyroid Function Tests: No results for input(s): "TSH", "T4TOTAL", "FREET4", "T3FREE", "THYROIDAB" in the last 72 hours. Anemia Panel: No results for input(s): "VITAMINB12", "FOLATE", "FERRITIN", "TIBC", "IRON", "RETICCTPCT" in the last 72  hours. Urine analysis:    Component Value Date/Time   COLORURINE YELLOW (A) 02/27/2024 0807   APPEARANCEUR CLEAR (A) 02/27/2024 0807   LABSPEC 1.017 02/27/2024 0807   PHURINE 6.0 02/27/2024 0807   GLUCOSEU NEGATIVE 02/27/2024 0807   HGBUR NEGATIVE 02/27/2024 0807   BILIRUBINUR NEGATIVE 02/27/2024 0807   KETONESUR NEGATIVE 02/27/2024 0807   PROTEINUR NEGATIVE 02/27/2024 0807   NITRITE NEGATIVE 02/27/2024 0807   LEUKOCYTESUR NEGATIVE 02/27/2024 0807    Radiological Exams on Admission: CT Head Wo Contrast Result Date: 02/27/2024 CLINICAL DATA:  Mental status changes. EXAM: CT HEAD WITHOUT CONTRAST TECHNIQUE: Contiguous axial images were obtained from the base of the skull through the vertex without intravenous contrast. RADIATION DOSE REDUCTION: This exam was performed according to the departmental dose-optimization program which includes automated exposure control, adjustment of the mA and/or kV according to patient size and/or use of iterative reconstruction technique. COMPARISON:  05/07/2023 FINDINGS: Brain: Chronic right hemispheric infarct again noted. There is no evidence for acute hemorrhage, hydrocephalus, mass lesion, or abnormal extra-axial fluid collection. No definite CT evidence for acute infarction. Lacunar infarct again noted left basal ganglia. Vascular: No hyperdense vessel or unexpected calcification. Skull: No evidence for fracture. No worrisome lytic or sclerotic lesion. Sinuses/Orbits: The visualized paranasal sinuses and mastoid air cells are clear. Visualized portions of the globes and intraorbital fat are unremarkable. Other: None. IMPRESSION: 1. No acute intracranial abnormality. 2. Chronic right hemispheric infarct. 3. Stable lacunar infarct left basal ganglia. Electronically Signed   By: Kennith Center M.D.   On: 02/27/2024 07:59   DG Chest Port 1 View Result Date: 02/27/2024 CLINICAL DATA:  Larey Seat yesterday, right-sided foot and hip pain. Syncopal episode. EXAM: PORTABLE  CHEST 1 VIEW RIGHT HIP 3 VIEWS RIGHT FOOT 3 VIEWS COMPARISON:  Chest CT without contrast 11/10/2023, right femoral series 11/24/2020. No prior right foot. FINDINGS: Chest AP portable 6:44 a.m.: The lungs are clear. The sulci are sharp. Heart size and vasculature are normal apart from aortic atherosclerosis and tortuosity. There is thoracic spondylosis.  Thoracic cage grossly intact. AP pelvis, AP and frog-leg right hip (three views): No evidence of right hip fracture or dislocation. Normal bone mineralization. No evidence for pelvic fracture or diastasis. There is mild symmetric  arthrosis of the hip joints. There is pelvic enthesopathy. Dystrophic calcifications in the prostate noted with otherwise unremarkable soft tissues. The SI joints are unremarkable, as visualized. Right foot, three views: There is swelling in the forefoot. Normal bone mineralization. No evidence of fracture, dislocation or primary pathologic process. There are mild degenerative changes in the toe joints and midfoot, moderate plantar and dorsal calcaneal spurring changes, spurring at the tibiotalar joint, and vascular calcifications in the dorsal distal foreleg. IMPRESSION: 1. No evidence of acute chest disease. Aortic atherosclerosis. 2. No evidence of right hip fracture or dislocation. 3. No evidence of right foot fracture or dislocation. Degenerative changes. 4. Forefoot swelling. Electronically Signed   By: Almira Bar M.D.   On: 02/27/2024 07:24   DG Foot Complete Right Result Date: 02/27/2024 CLINICAL DATA:  Larey Seat yesterday, right-sided foot and hip pain. Syncopal episode. EXAM: PORTABLE CHEST 1 VIEW RIGHT HIP 3 VIEWS RIGHT FOOT 3 VIEWS COMPARISON:  Chest CT without contrast 11/10/2023, right femoral series 11/24/2020. No prior right foot. FINDINGS: Chest AP portable 6:44 a.m.: The lungs are clear. The sulci are sharp. Heart size and vasculature are normal apart from aortic atherosclerosis and tortuosity. There is thoracic  spondylosis.  Thoracic cage grossly intact. AP pelvis, AP and frog-leg right hip (three views): No evidence of right hip fracture or dislocation. Normal bone mineralization. No evidence for pelvic fracture or diastasis. There is mild symmetric arthrosis of the hip joints. There is pelvic enthesopathy. Dystrophic calcifications in the prostate noted with otherwise unremarkable soft tissues. The SI joints are unremarkable, as visualized. Right foot, three views: There is swelling in the forefoot. Normal bone mineralization. No evidence of fracture, dislocation or primary pathologic process. There are mild degenerative changes in the toe joints and midfoot, moderate plantar and dorsal calcaneal spurring changes, spurring at the tibiotalar joint, and vascular calcifications in the dorsal distal foreleg. IMPRESSION: 1. No evidence of acute chest disease. Aortic atherosclerosis. 2. No evidence of right hip fracture or dislocation. 3. No evidence of right foot fracture or dislocation. Degenerative changes. 4. Forefoot swelling. Electronically Signed   By: Almira Bar M.D.   On: 02/27/2024 07:24   DG Hip Unilat With Pelvis 2-3 Views Right Result Date: 02/27/2024 CLINICAL DATA:  Larey Seat yesterday, right-sided foot and hip pain. Syncopal episode. EXAM: PORTABLE CHEST 1 VIEW RIGHT HIP 3 VIEWS RIGHT FOOT 3 VIEWS COMPARISON:  Chest CT without contrast 11/10/2023, right femoral series 11/24/2020. No prior right foot. FINDINGS: Chest AP portable 6:44 a.m.: The lungs are clear. The sulci are sharp. Heart size and vasculature are normal apart from aortic atherosclerosis and tortuosity. There is thoracic spondylosis.  Thoracic cage grossly intact. AP pelvis, AP and frog-leg right hip (three views): No evidence of right hip fracture or dislocation. Normal bone mineralization. No evidence for pelvic fracture or diastasis. There is mild symmetric arthrosis of the hip joints. There is pelvic enthesopathy. Dystrophic calcifications in  the prostate noted with otherwise unremarkable soft tissues. The SI joints are unremarkable, as visualized. Right foot, three views: There is swelling in the forefoot. Normal bone mineralization. No evidence of fracture, dislocation or primary pathologic process. There are mild degenerative changes in the toe joints and midfoot, moderate plantar and dorsal calcaneal spurring changes, spurring at the tibiotalar joint, and vascular calcifications in the dorsal distal foreleg. IMPRESSION: 1. No evidence of acute chest disease. Aortic atherosclerosis. 2. No evidence of right hip fracture or dislocation. 3. No evidence of right foot fracture or dislocation. Degenerative changes.  4. Forefoot swelling. Electronically Signed   By: Almira Bar M.D.   On: 02/27/2024 07:24   DG Knee Complete 4 Views Right Result Date: 02/27/2024 CLINICAL DATA:  Status post fall. EXAM: RIGHT KNEE - COMPLETE 4+ VIEW COMPARISON:  December 25, 2021 FINDINGS: No evidence of an acute fracture or dislocation. Moderate to marked severity tricompartmental degenerative changes are seen. A small joint effusion is noted. IMPRESSION: 1. Moderate to marked severity tricompartmental degenerative changes. 2. Small joint effusion. Electronically Signed   By: Aram Candela M.D.   On: 02/27/2024 00:47    EKG: Independently reviewed.  Sinus rhythm, no acute ST changes.  Assessment/Plan Principal Problem:   Aggressive behavior due to dementia [facility]) Active Problems:   Syncope  (please populate well all problems here in Problem List. (For example, if patient is on BP meds at home and you resume or decide to hold them, it is a problem that needs to be her. Same for CAD, COPD, HLD and so on)  Syncope -Patient appears to be volume contracted with dark-colored urine, and increasing BUN/creatinine ratio, and some level of AKI.  Will give 1 day of IV fluid and reevaluate.  Check orthostatic vital signs -Other DDx discussed with cardiology, who  recommends to download loop record to rule out significant arrhythmia, ED nurse notified.  Check A1c to rule out hypoglycemia -Echocardiogram  AKI -Prerenal, IV fluid and reevaluate -Hold off Lasix hydrochlorothiazide and losartan, continue Coreg -Start as needed hydralazine -Renal ultrasound ordered  HTN -As above  IIDM -Continue metformin -Check A1c to rule out hypoglycemia  Dementia with behaviors disturbance -Continue memantine and Aricept -as per psychiatry team   DVT prophylaxis: Lovenox Code Status: Full code Family Communication: None at bedside Disposition Plan: Expect less than 2 midnight hospital stay Consults called: None Admission status: Telemetry observation   Emeline General MD [facility] Pager [phone]  02/27/2024, 10:26 AM

## 2024-02-27 NOTE — ED Notes (Signed)
 No belongings located in [place] locker room

## 2024-02-27 NOTE — ED Provider Notes (Signed)
 Care assumed of patient from outgoing provider.  See their note for initial history, exam and plan.  Patient is here waiting for outside placement.  Patient had multiple episodes of syncope.  Had 1 witnessed episode 1 day ago and then multiple episodes overnight.  Did have head injury.  History of dementia and prior CVA.  EKG without findings of acute ischemia or dysrhythmia.  No obvious infectious source.  Initial troponin is negative.  Negative for COVID and influenza.  Urine without signs of urinary tract infection.  Normal lactic acid.  Consulted hospitalist for recurrent syncope with multiple risk factors, based on [place] syncope rule patient is not low risk for serious outcome.Corena Herter, MD 02/27/24 [phone]

## 2024-02-27 NOTE — ED Notes (Signed)
Report from Travis, RN

## 2024-02-27 NOTE — ED Notes (Signed)
 Pt agitated and pulling monitoring equipment off and trying to get out of bed. Pt pulled second IV out. Charge nurse informed that sitter is needed for pt. Pt is high fall risk and continues to attempt to get out of the bed. Pt upset with staff for not letting him get out of pt. Pt was able to be redirected back into the bed. New IV placed and monitoring equipment put back on patient.

## 2024-02-27 NOTE — ED Provider Notes (Signed)
-----------------------------------------   6:18 AM on 02/27/2024 -----------------------------------------   Called urgently to bedside for patient with witnessed syncope.  Staff state patient was assisted to the side of the bed to use the urinal.  Reports patient became near syncopal, feeling lightheaded.  Became more responsive than syncopized briefly, lasting 5 to 10 seconds.  No seizure activity noted.  Patient awake, alert, voices no complaints but also denies passing out.  Patient with a history of dementia, CVA, hypertension who has been boarding in the emergency department since 02/19/2024 awaiting placement after initial psychiatric evaluation for which he was cleared.  No new or recent medications given tonight.  Will obtain cardiac panel including lactic acid, EKG, CT head, respiratory panel, chest x-ray.  Blood sugar 86.  ----------------------------------------- 6:33 AM on 02/27/2024 -----------------------------------------   ED ECG REPORT I, Jalee Saine J, the attending physician, personally viewed and interpreted this ECG.   Date: 02/27/2024  EKG Time: 0631  Rate: 59  Rhythm: sinus bradycardia  Axis: Normal  Intervals:none  ST&T Change: Nonspecific  ----------------------------------------- 6:47 AM on 02/27/2024 -----------------------------------------   Patient reportedly fell yesterday, had negative right knee x-rays.  Unclear if he struck his head.  Complaining about his right foot.  Will also image right hip.  ----------------------------------------- 7:07 AM on 02/27/2024 -----------------------------------------   Care transferred to oncoming provider at change of shift pending all laboratory and imaging results. Anticipate hospitalization for recurrent syncope.   Irean Hong, MD 02/27/24 [phone]

## 2024-02-27 NOTE — ED Provider Notes (Signed)
 Emergency Medicine Observation Re-evaluation Note  Physical Exam   BP 127/78   Pulse (!) 59   Temp 99 F (37.2 C) (Oral)   Resp 18   Ht 5\' 7"  (1.702 m)   Wt 88 kg   SpO2 92%   BMI 30.39 kg/m   Patient appears in no acute distress.  ED Course / MDM   No reported events during my shift at the time of this note.   Pt is awaiting dispo from consultants   Pilar Jarvis MD    Pilar Jarvis, MD 02/27/24 [phone]

## 2024-02-27 NOTE — ED Notes (Signed)
 Pt to CT

## 2024-02-27 NOTE — ED Notes (Signed)
 Pt trying to get out bed, pt pulled off monitoring equipment, and pulled out IV. Pt easily redirectable. Pt assisted back in bed by this RN and [place], EDT. IV line replaced and pt placed back on monitor.

## 2024-02-27 NOTE — ED Notes (Signed)
 No breakfast tray provided. Pt NPO.

## 2024-02-27 NOTE — ED Notes (Signed)
 CCMD called and pt added to cardiac monitoring. Strip printed for chart.

## 2024-02-27 NOTE — ED Notes (Signed)
 Pt assisted with use of urinal. Pt alert and talkative.

## 2024-02-27 NOTE — ED Notes (Signed)
 Ultrasound tech at bedside

## 2024-02-28 ENCOUNTER — Observation Stay

## 2024-02-28 ENCOUNTER — Encounter: Payer: Self-pay | Admitting: Internal Medicine

## 2024-02-28 DIAGNOSIS — F03918 Unspecified dementia, unspecified severity, with other behavioral disturbance: Secondary | ICD-10-CM | POA: Diagnosis not present

## 2024-02-28 DIAGNOSIS — M109 Gout, unspecified: Secondary | ICD-10-CM | POA: Diagnosis present

## 2024-02-28 DIAGNOSIS — N400 Enlarged prostate without lower urinary tract symptoms: Secondary | ICD-10-CM | POA: Diagnosis present

## 2024-02-28 DIAGNOSIS — Z8673 Personal history of transient ischemic attack (TIA), and cerebral infarction without residual deficits: Secondary | ICD-10-CM | POA: Diagnosis not present

## 2024-02-28 DIAGNOSIS — F03C18 Unspecified dementia, severe, with other behavioral disturbance: Secondary | ICD-10-CM | POA: Diagnosis present

## 2024-02-28 DIAGNOSIS — Z7984 Long term (current) use of oral hypoglycemic drugs: Secondary | ICD-10-CM | POA: Diagnosis not present

## 2024-02-28 DIAGNOSIS — R55 Syncope and collapse: Secondary | ICD-10-CM | POA: Diagnosis present

## 2024-02-28 DIAGNOSIS — F03C4 Unspecified dementia, severe, with anxiety: Secondary | ICD-10-CM | POA: Diagnosis present

## 2024-02-28 DIAGNOSIS — Z825 Family history of asthma and other chronic lower respiratory diseases: Secondary | ICD-10-CM | POA: Diagnosis not present

## 2024-02-28 DIAGNOSIS — Z818 Family history of other mental and behavioral disorders: Secondary | ICD-10-CM | POA: Diagnosis not present

## 2024-02-28 DIAGNOSIS — F03C11 Unspecified dementia, severe, with agitation: Secondary | ICD-10-CM | POA: Diagnosis present

## 2024-02-28 DIAGNOSIS — G8929 Other chronic pain: Secondary | ICD-10-CM | POA: Diagnosis present

## 2024-02-28 DIAGNOSIS — N179 Acute kidney failure, unspecified: Secondary | ICD-10-CM | POA: Diagnosis present

## 2024-02-28 DIAGNOSIS — E1140 Type 2 diabetes mellitus with diabetic neuropathy, unspecified: Secondary | ICD-10-CM | POA: Diagnosis present

## 2024-02-28 DIAGNOSIS — Z1152 Encounter for screening for COVID-19: Secondary | ICD-10-CM | POA: Diagnosis not present

## 2024-02-28 DIAGNOSIS — Z79899 Other long term (current) drug therapy: Secondary | ICD-10-CM | POA: Diagnosis not present

## 2024-02-28 DIAGNOSIS — R7989 Other specified abnormal findings of blood chemistry: Secondary | ICD-10-CM | POA: Diagnosis present

## 2024-02-28 DIAGNOSIS — E869 Volume depletion, unspecified: Secondary | ICD-10-CM | POA: Diagnosis present

## 2024-02-28 DIAGNOSIS — Z91013 Allergy to seafood: Secondary | ICD-10-CM | POA: Diagnosis not present

## 2024-02-28 DIAGNOSIS — Z833 Family history of diabetes mellitus: Secondary | ICD-10-CM | POA: Diagnosis not present

## 2024-02-28 DIAGNOSIS — Z7982 Long term (current) use of aspirin: Secondary | ICD-10-CM | POA: Diagnosis not present

## 2024-02-28 DIAGNOSIS — E113293 Type 2 diabetes mellitus with mild nonproliferative diabetic retinopathy without macular edema, bilateral: Secondary | ICD-10-CM | POA: Diagnosis present

## 2024-02-28 DIAGNOSIS — I10 Essential (primary) hypertension: Secondary | ICD-10-CM | POA: Diagnosis present

## 2024-02-28 LAB — GLUCOSE, CAPILLARY: Glucose-Capillary: 113 mg/dL — ABNORMAL HIGH (ref 70–99)

## 2024-02-28 LAB — BASIC METABOLIC PANEL
Anion gap: 9 (ref 5–15)
BUN: 38 mg/dL — ABNORMAL HIGH (ref 8–23)
CO2: 27 mmol/L (ref 22–32)
Calcium: 8.7 mg/dL — ABNORMAL LOW (ref 8.9–10.3)
Chloride: 103 mmol/L (ref 98–111)
Creatinine, Ser: 1.39 mg/dL — ABNORMAL HIGH (ref 0.61–1.24)
GFR, Estimated: 55 mL/min — ABNORMAL LOW (ref >60)
Glucose, Bld: 125 mg/dL — ABNORMAL HIGH (ref 70–99)
Potassium: 3.4 mmol/L — ABNORMAL LOW (ref 3.5–5.1)
Sodium: 139 mmol/L (ref 135–145)

## 2024-02-28 LAB — HIV ANTIBODY (ROUTINE TESTING W REFLEX): HIV Screen 4th Generation wRfx: NONREACTIVE

## 2024-02-28 MED ORDER — DICLOFENAC SODIUM 1 % EX GEL
4.0000 g | Freq: Four times a day (QID) | CUTANEOUS | Status: DC
  Administered 2024-02-28 – 2024-02-29 (×4): 4 g via TOPICAL
  Filled 2024-02-28: qty 100

## 2024-02-28 MED ORDER — ZIPRASIDONE MESYLATE 20 MG IM SOLR
10.0000 mg | Freq: Four times a day (QID) | INTRAMUSCULAR | Status: DC | PRN
  Administered 2024-02-28: 10 mg via INTRAMUSCULAR
  Filled 2024-02-28 (×2): qty 20

## 2024-02-28 NOTE — Progress Notes (Signed)
 PT Cancellation Note  Patient Details Name: [name] MRN: [id number] DOB: 1951/03/24   Cancelled Treatment:    Reason Eval/Treat Not Completed: Other (comment). Per discussion with care team, plan to continue mobility services through mobility specialists as formal PT/OT not indicated at this time. Last walking 250 with Mod I and RW on 3/7. Deficits continue to be behavioral. Message sent to mobility to inform of any new rehab issues. Will complete consult at this time.   Corlette Ciano 02/28/2024, 2:29 PM Elizabeth Palau, PT, DPT, GCS [phone]

## 2024-02-28 NOTE — Care Management Obs Status (Signed)
 MEDICARE OBSERVATION STATUS NOTIFICATION   Patient Details  Name: [name] MRN: [id number] Date of Birth: Jun 15, 1951   Medicare Observation Status Notification Given:  Orland Dec, CMA 02/28/2024, 11:43 AM

## 2024-02-28 NOTE — Progress Notes (Signed)
 PROGRESS NOTE    [name]  EAV:[id number] DOB: October 29, 1951 DOA: 02/19/2024 PCP: Raymon Mutton., FNP    Brief Narrative:  73 y.o. male with medical history significant of cryptogenic stroke on loop recorder, CVA, dementia with behavioral disturbance, HTN, IIDM, DM neuropathy, BPH, who was found to have syncope episode x 3 during ED stay.   Patient initially brought to ED by family member for evaluation of worsening of agitation and mental declining on February 28.  Subsequently patient has been staying in the ED for the last 10+ days and unable to discharge back home due to increasing risk of mentation deterioration.     Last 2 days, ED physician witnessed at least 3 episodes of syncope.  Unfortunately patient was not on telemonitoring as time but ED staff reported that the patient suddenly became unresponsive symptoms lasted for about 10 seconds and patient recovers consciousness.  Patient does not remember any prodromes however due to ED record, patient did complain about lightheadedness before he lost consciousness.  No seizure activity reported.  ED workup showed hemoglobin 12.4, WBC 6.4, creatinine 1.3 compared to baseline 1.1-1.3, BUN 41   Assessment & Plan:   Principal Problem:   Aggressive behavior due to dementia [facility]) Active Problems:   Syncope Syncope -Patient appears to be volume contracted with dark-colored urine, and increasing BUN/creatinine ratio, and some level of AKI.   Plan: Continue IVF Encourage p.o. fluid intake Check 2D echocardiogram  AKI Suspect prerenal azotemia in the setting of Lasix, hydrochlorothiazide, losartan use3 Renal ultrasound reassuring Plan: Hold Lasix, hydrochlorothiazide, losartan Continue Coreg As needed IV hydralazine   HTN -As above   IIDM -Continue metformin -Hemoglobin A1c 5.8.  Good control   Dementia with behaviors disturbance -Continue memantine and Aricept -as per psychiatry team      DVT prophylaxis: SQ  Lovenox Code Status: Full Family Communication: None Disposition Plan: Status is: Observation The patient will require care spanning > 2 midnights and should be moved to inpatient because: AKI on IV fluids.  Syncope currently under further workup   Level of care: Telemetry Medical  Consultants:  None  Procedures:  None  Antimicrobials: None   Subjective: Seen and examined.  No acute distress.  No acute events.  Sitting up in bed eating breakfast.  Objective: Vitals:   02/28/24 0007 02/28/24 0408 02/28/24 0500 02/28/24 0724  BP: 134/60 (!) 129/58  (!) 154/77  Pulse: 72 72  75  Resp: 16 16  18   Temp: 98.1 F (36.7 C) 98.5 F (36.9 C)  99 F (37.2 C)  TempSrc: Oral Oral  Oral  SpO2: 100% 98%  99%  Weight:   83.5 kg   Height:        Intake/Output Summary (Last 24 hours) at 02/28/2024 1103 Last data filed at 02/28/2024 0557 Gross per 24 hour  Intake --  Output 825 ml  Net -825 ml   Filed Weights   02/19/24 2017 02/27/24 1700 02/28/24 0500  Weight: 88 kg 82.8 kg 83.5 kg    Examination:  General exam: Appears calm and comfortable  Respiratory system: Clear to auscultation. Respiratory effort normal. Cardiovascular system: S1-S2, RRR, no murmurs, no pedal edema Gastrointestinal system: Soft, NT/ND, normal bowel sounds Central nervous system: Alert.  Oriented x 2.  No focal deficits Extremities: Symmetric 5 x 5 power. Skin: No rashes, lesions or ulcers Psychiatry: Judgement and insight appear normal. Mood & affect appropriate.     Data Reviewed: I have personally reviewed following labs and  imaging studies  CBC: Recent Labs  Lab 02/27/24 0627  WBC 6.4  NEUTROABS 3.9  HGB 12.4*  HCT 37.4*  MCV 89.3  PLT 219   Basic Metabolic Panel: Recent Labs  Lab 02/27/24 0627 02/28/24 0418  NA 137 139  K 4.2 3.4*  CL 98 103  CO2 31 27  GLUCOSE 123* 125*  BUN 41* 38*  CREATININE 1.62* 1.39*  CALCIUM 9.4 8.7*   GFR: Estimated Creatinine Clearance: 51.1  mL/min (A) (by C-G formula based on SCr of 1.39 mg/dL (H)). Liver Function Tests: Recent Labs  Lab 02/27/24 0627  AST 20  ALT 12  ALKPHOS 41  BILITOT 0.6  PROT 6.9  ALBUMIN 3.4*   No results for input(s): "LIPASE", "AMYLASE" in the last 168 hours. No results for input(s): "AMMONIA" in the last 168 hours. Coagulation Profile: No results for input(s): "INR", "PROTIME" in the last 168 hours. Cardiac Enzymes: No results for input(s): "CKTOTAL", "CKMB", "CKMBINDEX", "TROPONINI" in the last 168 hours. BNP (last 3 results) No results for input(s): "PROBNP" in the last 8760 hours. HbA1C: Recent Labs    02/27/24 0627  HGBA1C 5.8*   CBG: Recent Labs  Lab 02/27/24 0617 02/28/24 0810  GLUCAP 86 113*   Lipid Profile: No results for input(s): "CHOL", "HDL", "LDLCALC", "TRIG", "CHOLHDL", "LDLDIRECT" in the last 72 hours. Thyroid Function Tests: No results for input(s): "TSH", "T4TOTAL", "FREET4", "T3FREE", "THYROIDAB" in the last 72 hours. Anemia Panel: No results for input(s): "VITAMINB12", "FOLATE", "FERRITIN", "TIBC", "IRON", "RETICCTPCT" in the last 72 hours. Sepsis Labs: Recent Labs  Lab 02/27/24 1191 02/27/24 0823  LATICACIDVEN 1.2 0.9    Recent Results (from the past 240 hours)  Resp panel by RT-PCR (RSV, Flu A&B, Covid) Anterior Nasal Swab     Status: None   Collection Time: 02/27/24  6:28 AM   Specimen: Anterior Nasal Swab  Result Value Ref Range Status   SARS Coronavirus 2 by RT PCR NEGATIVE NEGATIVE Final    Comment: (NOTE) SARS-CoV-2 target nucleic acids are NOT DETECTED.  The SARS-CoV-2 RNA is generally detectable in upper respiratory specimens during the acute phase of infection. The lowest concentration of SARS-CoV-2 viral copies this assay can detect is 138 copies/mL. A negative result does not preclude SARS-Cov-2 infection and should not be used as the sole basis for treatment or other patient management decisions. A negative result may occur with   improper specimen collection/handling, submission of specimen other than nasopharyngeal swab, presence of viral mutation(s) within the areas targeted by this assay, and inadequate number of viral copies(<138 copies/mL). A negative result must be combined with clinical observations, patient history, and epidemiological information. The expected result is Negative.  Fact Sheet for Patients:  BloggerCourse.com  Fact Sheet for Healthcare Providers:  SeriousBroker.it  This test is no t yet approved or cleared by the [place] FDA and  has been authorized for detection and/or diagnosis of SARS-CoV-2 by FDA under an Emergency Use Authorization (EUA). This EUA will remain  in effect (meaning this test can be used) for the duration of the COVID-19 declaration under Section 564(b)(1) of the Act, 21 U.S.C.section 360bbb-3(b)(1), unless the authorization is terminated  or revoked sooner.       Influenza A by PCR NEGATIVE NEGATIVE Final   Influenza B by PCR NEGATIVE NEGATIVE Final    Comment: (NOTE) The Xpert Xpress SARS-CoV-2/FLU/RSV plus assay is intended as an aid in the diagnosis of influenza from Nasopharyngeal swab specimens and should not be used as a  sole basis for treatment. Nasal washings and aspirates are unacceptable for Xpert Xpress SARS-CoV-2/FLU/RSV testing.  Fact Sheet for Patients: BloggerCourse.com  Fact Sheet for Healthcare Providers: SeriousBroker.it  This test is not yet approved or cleared by the [place] FDA and has been authorized for detection and/or diagnosis of SARS-CoV-2 by FDA under an Emergency Use Authorization (EUA). This EUA will remain in effect (meaning this test can be used) for the duration of the COVID-19 declaration under Section 564(b)(1) of the Act, 21 U.S.C. section 360bbb-3(b)(1), unless the authorization is terminated or revoked.      Resp Syncytial Virus by PCR NEGATIVE NEGATIVE Final    Comment: (NOTE) Fact Sheet for Patients: BloggerCourse.com  Fact Sheet for Healthcare Providers: SeriousBroker.it  This test is not yet approved or cleared by the [place] FDA and has been authorized for detection and/or diagnosis of SARS-CoV-2 by FDA under an Emergency Use Authorization (EUA). This EUA will remain in effect (meaning this test can be used) for the duration of the COVID-19 declaration under Section 564(b)(1) of the Act, 21 U.S.C. section 360bbb-3(b)(1), unless the authorization is terminated or revoked.  Performed at [facility], [address] [place], [place] [address]          Radiology Studies: US RENAL Result Date: 02/27/2024 CLINICAL DATA:  [id number] AKI (acute kidney injury) (HCC) [id number] EXAM: RENAL / URINARY TRACT ULTRASOUND COMPLETE COMPARISON:  None Available. FINDINGS: Right Kidney: Renal measurements: 10.0 x 4.5 x 4.9 cm = volume: 114 mL. Mildly echogenic right renal parenchyma, normal thickness. No hydronephrosis. No renal mass. Left Kidney: Renal measurements: 9.7 x 6.0 x 4.6 cm = volume: 139 mL. Mildly echogenic left renal parenchyma, normal thickness. No hydronephrosis. No renal mass. Bladder: Appears normal for degree of bladder distention. Ureteral jets seen bilaterally in the bladder lumen. Other: None. IMPRESSION: Echogenic normal size kidneys, compatible with reported history of nonspecific acute renal parenchymal disease. No hydronephrosis. Electronically Signed   By: Delbert Phenix M.D.   On: 02/27/2024 11:41   CT Head Wo Contrast Result Date: 02/27/2024 CLINICAL DATA:  Mental status changes. EXAM: CT HEAD WITHOUT CONTRAST TECHNIQUE: Contiguous axial images were obtained from the base of the skull through the vertex without intravenous contrast. RADIATION DOSE REDUCTION: This exam was performed according to the departmental  dose-optimization program which includes automated exposure control, adjustment of the mA and/or kV according to patient size and/or use of iterative reconstruction technique. COMPARISON:  05/07/2023 FINDINGS: Brain: Chronic right hemispheric infarct again noted. There is no evidence for acute hemorrhage, hydrocephalus, mass lesion, or abnormal extra-axial fluid collection. No definite CT evidence for acute infarction. Lacunar infarct again noted left basal ganglia. Vascular: No hyperdense vessel or unexpected calcification. Skull: No evidence for fracture. No worrisome lytic or sclerotic lesion. Sinuses/Orbits: The visualized paranasal sinuses and mastoid air cells are clear. Visualized portions of the globes and intraorbital fat are unremarkable. Other: None. IMPRESSION: 1. No acute intracranial abnormality. 2. Chronic right hemispheric infarct. 3. Stable lacunar infarct left basal ganglia. Electronically Signed   By: Kennith Center M.D.   On: 02/27/2024 07:59   DG Chest Port 1 View Result Date: 02/27/2024 CLINICAL DATA:  Larey Seat yesterday, right-sided foot and hip pain. Syncopal episode. EXAM: PORTABLE CHEST 1 VIEW RIGHT HIP 3 VIEWS RIGHT FOOT 3 VIEWS COMPARISON:  Chest CT without contrast 11/10/2023, right femoral series 11/24/2020. No prior right foot. FINDINGS: Chest AP portable 6:44 a.m.: The lungs are clear. The sulci are sharp. Heart size and vasculature  are normal apart from aortic atherosclerosis and tortuosity. There is thoracic spondylosis.  Thoracic cage grossly intact. AP pelvis, AP and frog-leg right hip (three views): No evidence of right hip fracture or dislocation. Normal bone mineralization. No evidence for pelvic fracture or diastasis. There is mild symmetric arthrosis of the hip joints. There is pelvic enthesopathy. Dystrophic calcifications in the prostate noted with otherwise unremarkable soft tissues. The SI joints are unremarkable, as visualized. Right foot, three views: There is swelling in  the forefoot. Normal bone mineralization. No evidence of fracture, dislocation or primary pathologic process. There are mild degenerative changes in the toe joints and midfoot, moderate plantar and dorsal calcaneal spurring changes, spurring at the tibiotalar joint, and vascular calcifications in the dorsal distal foreleg. IMPRESSION: 1. No evidence of acute chest disease. Aortic atherosclerosis. 2. No evidence of right hip fracture or dislocation. 3. No evidence of right foot fracture or dislocation. Degenerative changes. 4. Forefoot swelling. Electronically Signed   By: Almira Bar M.D.   On: 02/27/2024 07:24   DG Foot Complete Right Result Date: 02/27/2024 CLINICAL DATA:  Larey Seat yesterday, right-sided foot and hip pain. Syncopal episode. EXAM: PORTABLE CHEST 1 VIEW RIGHT HIP 3 VIEWS RIGHT FOOT 3 VIEWS COMPARISON:  Chest CT without contrast 11/10/2023, right femoral series 11/24/2020. No prior right foot. FINDINGS: Chest AP portable 6:44 a.m.: The lungs are clear. The sulci are sharp. Heart size and vasculature are normal apart from aortic atherosclerosis and tortuosity. There is thoracic spondylosis.  Thoracic cage grossly intact. AP pelvis, AP and frog-leg right hip (three views): No evidence of right hip fracture or dislocation. Normal bone mineralization. No evidence for pelvic fracture or diastasis. There is mild symmetric arthrosis of the hip joints. There is pelvic enthesopathy. Dystrophic calcifications in the prostate noted with otherwise unremarkable soft tissues. The SI joints are unremarkable, as visualized. Right foot, three views: There is swelling in the forefoot. Normal bone mineralization. No evidence of fracture, dislocation or primary pathologic process. There are mild degenerative changes in the toe joints and midfoot, moderate plantar and dorsal calcaneal spurring changes, spurring at the tibiotalar joint, and vascular calcifications in the dorsal distal foreleg. IMPRESSION: 1. No evidence  of acute chest disease. Aortic atherosclerosis. 2. No evidence of right hip fracture or dislocation. 3. No evidence of right foot fracture or dislocation. Degenerative changes. 4. Forefoot swelling. Electronically Signed   By: Almira Bar M.D.   On: 02/27/2024 07:24   DG Hip Unilat With Pelvis 2-3 Views Right Result Date: 02/27/2024 CLINICAL DATA:  Larey Seat yesterday, right-sided foot and hip pain. Syncopal episode. EXAM: PORTABLE CHEST 1 VIEW RIGHT HIP 3 VIEWS RIGHT FOOT 3 VIEWS COMPARISON:  Chest CT without contrast 11/10/2023, right femoral series 11/24/2020. No prior right foot. FINDINGS: Chest AP portable 6:44 a.m.: The lungs are clear. The sulci are sharp. Heart size and vasculature are normal apart from aortic atherosclerosis and tortuosity. There is thoracic spondylosis.  Thoracic cage grossly intact. AP pelvis, AP and frog-leg right hip (three views): No evidence of right hip fracture or dislocation. Normal bone mineralization. No evidence for pelvic fracture or diastasis. There is mild symmetric arthrosis of the hip joints. There is pelvic enthesopathy. Dystrophic calcifications in the prostate noted with otherwise unremarkable soft tissues. The SI joints are unremarkable, as visualized. Right foot, three views: There is swelling in the forefoot. Normal bone mineralization. No evidence of fracture, dislocation or primary pathologic process. There are mild degenerative changes in the toe joints and midfoot, moderate plantar  and dorsal calcaneal spurring changes, spurring at the tibiotalar joint, and vascular calcifications in the dorsal distal foreleg. IMPRESSION: 1. No evidence of acute chest disease. Aortic atherosclerosis. 2. No evidence of right hip fracture or dislocation. 3. No evidence of right foot fracture or dislocation. Degenerative changes. 4. Forefoot swelling. Electronically Signed   By: Almira Bar M.D.   On: 02/27/2024 07:24   DG Knee Complete 4 Views Right Result Date:  02/27/2024 CLINICAL DATA:  Status post fall. EXAM: RIGHT KNEE - COMPLETE 4+ VIEW COMPARISON:  December 25, 2021 FINDINGS: No evidence of an acute fracture or dislocation. Moderate to marked severity tricompartmental degenerative changes are seen. A small joint effusion is noted. IMPRESSION: 1. Moderate to marked severity tricompartmental degenerative changes. 2. Small joint effusion. Electronically Signed   By: Aram Candela M.D.   On: 02/27/2024 00:47        Scheduled Meds:  atorvastatin  40 mg Oral q1800   diclofenac Sodium  4 g Topical QID   donepezil  10 mg Oral QHS   enoxaparin (LOVENOX) injection  40 mg Subcutaneous Q24H   latanoprost  1 drop Both Eyes QHS   memantine  10 mg Oral BID   metFORMIN  500 mg Oral BID WC   multivitamin with minerals  1 tablet Oral Daily   polyvinyl alcohol  1 drop Both Eyes q AM   potassium chloride SA  20 mEq Oral Daily   QUEtiapine  100 mg Oral QHS   sodium chloride flush  3 mL Intravenous Q12H   tamsulosin  0.4 mg Oral Daily   traZODone  150 mg Oral QHS   tuberculin  5 Units Intradermal STAT   Continuous Infusions:  sodium chloride Stopped (02/28/24 1032)     LOS: 0 days    Tresa Moore, MD [facility]   If 7PM-7AM, please contact night-coverage  02/28/2024, 11:03 AM

## 2024-02-28 NOTE — Progress Notes (Signed)
 OT Cancellation Note  Patient Details Name: [name] MRN: [id number] DOB: 04-05-1951   Cancelled Treatment:    Reason Eval/Treat Not Completed: Other (comment).Per discussion with care team, plan to continue mobility services through mobility specialists as formal PT/OT not indicated at this time. Last walking 250 with Mod I and RW on 3/7. Deficits continue to be behavioral. Message sent to mobility to inform of any new rehab issues. Will complete consult at this time.   Jackquline Denmark, MS, OTR/L , CBIS ascom [phone]  02/28/24, 4:30 PM

## 2024-02-29 ENCOUNTER — Inpatient Hospital Stay (HOSPITAL_COMMUNITY)
Admit: 2024-02-29 | Discharge: 2024-02-29 | Disposition: A | Discharge status: Home / Self Care | Attending: Internal Medicine

## 2024-02-29 DIAGNOSIS — R55 Syncope and collapse: Secondary | ICD-10-CM

## 2024-02-29 DIAGNOSIS — F03918 Unspecified dementia, unspecified severity, with other behavioral disturbance: Secondary | ICD-10-CM | POA: Diagnosis not present

## 2024-02-29 LAB — CBC WITH DIFFERENTIAL/PLATELET
Abs Immature Granulocytes: 0.02 10*3/uL (ref 0.00–0.07)
Basophils Absolute: 0 10*3/uL (ref 0.0–0.1)
Basophils Relative: 0 %
Eosinophils Absolute: 0.1 10*3/uL (ref 0.0–0.5)
Eosinophils Relative: 2 %
HCT: 32.8 % — ABNORMAL LOW (ref 39.0–52.0)
Hemoglobin: 11.4 g/dL — ABNORMAL LOW (ref 13.0–17.0)
Immature Granulocytes: 0 %
Lymphocytes Relative: 34 %
Lymphs Abs: 1.6 10*3/uL (ref 0.7–4.0)
MCH: 30.2 pg (ref 26.0–34.0)
MCHC: 34.8 g/dL (ref 30.0–36.0)
MCV: 87 fL (ref 80.0–100.0)
Monocytes Absolute: 0.6 10*3/uL (ref 0.1–1.0)
Monocytes Relative: 12 %
Neutro Abs: 2.5 10*3/uL (ref 1.7–7.7)
Neutrophils Relative %: 52 %
Platelets: 206 10*3/uL (ref 150–400)
RBC: 3.77 MIL/uL — ABNORMAL LOW (ref 4.22–5.81)
RDW: 12.5 % (ref 11.5–15.5)
WBC: 4.7 10*3/uL (ref 4.0–10.5)
nRBC: 0 % (ref 0.0–0.2)

## 2024-02-29 LAB — BASIC METABOLIC PANEL
Anion gap: 7 (ref 5–15)
BUN: 27 mg/dL — ABNORMAL HIGH (ref 8–23)
CO2: 26 mmol/L (ref 22–32)
Calcium: 8.7 mg/dL — ABNORMAL LOW (ref 8.9–10.3)
Chloride: 105 mmol/L (ref 98–111)
Creatinine, Ser: 1.26 mg/dL — ABNORMAL HIGH (ref 0.61–1.24)
GFR, Estimated: >60 mL/min (ref >60)
Glucose, Bld: 111 mg/dL — ABNORMAL HIGH (ref 70–99)
Potassium: 3.6 mmol/L (ref 3.5–5.1)
Sodium: 138 mmol/L (ref 135–145)

## 2024-02-29 LAB — ECHOCARDIOGRAM COMPLETE
AR max vel: 2.78 cm2
AV Area VTI: 3.57 cm2
AV Area mean vel: 2.88 cm2
AV Mean grad: 3 mmHg
AV Peak grad: 7 mmHg
Ao pk vel: 1.32 m/s
Area-P 1/2: 3.28 cm2
Height: 67 in
MV VTI: 3.03 cm2
S' Lateral: 2.8 cm
Weight: 2938.29 [oz_av]

## 2024-02-29 LAB — GLUCOSE, CAPILLARY: Glucose-Capillary: 89 mg/dL (ref 70–99)

## 2024-02-29 MED ORDER — METHOCARBAMOL 500 MG PO TABS
500.0000 mg | ORAL_TABLET | Freq: Three times a day (TID) | ORAL | Status: DC
  Administered 2024-02-29 – 2024-03-11 (×34): 500 mg via ORAL
  Filled 2024-02-29 (×34): qty 1

## 2024-02-29 MED ORDER — DICLOFENAC SODIUM 1 % EX GEL
4.0000 g | Freq: Four times a day (QID) | CUTANEOUS | Status: DC
  Administered 2024-02-29 – 2024-03-10 (×40): 4 g via TOPICAL
  Filled 2024-02-29: qty 100

## 2024-02-29 NOTE — Progress Notes (Signed)
*  PRELIMINARY RESULTS* Echocardiogram 2D Echocardiogram has been performed.  Cristela Blue 02/29/2024, 8:51 AM

## 2024-02-29 NOTE — Progress Notes (Signed)
 PROGRESS NOTE    [name]  UEA:[id number] DOB: 1951/05/29 DOA: 02/19/2024 PCP: Raymon Mutton., FNP    Brief Narrative:  73 y.o. male with medical history significant of cryptogenic stroke on loop recorder, CVA, dementia with behavioral disturbance, HTN, IIDM, DM neuropathy, BPH, who was found to have syncope episode x 3 during ED stay.   Patient initially brought to ED by family member for evaluation of worsening of agitation and mental declining on February 28.  Subsequently patient has been staying in the ED for the last 10+ days and unable to discharge back home due to increasing risk of mentation deterioration.     Last 2 days, ED physician witnessed at least 3 episodes of syncope.  Unfortunately patient was not on telemonitoring as time but ED staff reported that the patient suddenly became unresponsive symptoms lasted for about 10 seconds and patient recovers consciousness.  Patient does not remember any prodromes however due to ED record, patient did complain about lightheadedness before he lost consciousness.  No seizure activity reported.  ED workup showed hemoglobin 12.4, WBC 6.4, creatinine 1.3 compared to baseline 1.1-1.3, BUN 41   Assessment & Plan:   Principal Problem:   Aggressive behavior due to dementia [facility]) Active Problems:   Syncope  Syncope -Patient appears to be volume contracted with dark-colored urine, and increasing BUN/creatinine ratio, and some level of AKI.  Kidney function improved.  At or near baseline.  Echocardiogram normal Plan: DC IVF Encourage p.o. fluid intake  AKI Suspect prerenal azotemia in the setting of Lasix, hydrochlorothiazide, losartan use3 Renal ultrasound reassuring.  Kidney function improving.  Suggesting prerenal azotemia. Plan: Continue to hold Lasix, HCTZ, losartan Continue Coreg As needed IV hydralazine   HTN -As above   IIDM -Continue metformin -Hemoglobin A1c 5.8.  Good control   Dementia with behaviors  disturbance -Continue memantine and Aricept -as per psychiatry team  Right foot pain Some consideration for crystalline arthropathy.  Presentation is not entirely consistent with acute gout.  Patient cannot tolerate significant palpation of his foot and manipulation of the toes without significant pain. Plan: Robaxin 500 p.o. 3 times daily 4 times daily diclofenac gel Check uric acid      DVT prophylaxis: SQ Lovenox Code Status: Full Family Communication: None Disposition Plan: Status is: Inpatient Remains inpatient appropriate because: Unsafe discharge plan.  Pending placement at memory care unit.  Medically appropriate for discharge     Level of care: Telemetry Medical  Consultants:  None  Procedures:  None  Antimicrobials: None   Subjective: Seen in exam.  Pleasant.  Calm, cooperative.  No distress.  Objective: Vitals:   02/28/24 2006 02/29/24 0500 02/29/24 0510 02/29/24 0845  BP: (!) 147/66  (!) 128/54 135/65  Pulse: 77  69 68  Resp: 16  18 16   Temp: 98.6 F (37 C)  97.7 F (36.5 C) 98.7 F (37.1 C)  TempSrc:    Oral  SpO2: 90%  100% 100%  Weight:  83.3 kg    Height:        Intake/Output Summary (Last 24 hours) at 02/29/2024 1428 Last data filed at 02/29/2024 1216 Gross per 24 hour  Intake 4165.53 ml  Output 975 ml  Net 3190.53 ml   Filed Weights   02/27/24 1700 02/28/24 0500 02/29/24 0500  Weight: 82.8 kg 83.5 kg 83.3 kg    Examination:  General exam: NAD Respiratory system: Clear to auscultation. Respiratory effort normal. Cardiovascular system: S1-S2, RRR, no murmurs, no pedal edema Gastrointestinal  system: Soft, NT/ND, normal bowel sounds Central nervous system: Alert.  Oriented x 2.  No focal deficits Extremities: Symmetric 5 x 5 power.  Right foot slight swelling.  Slight TTP Skin: No rashes, lesions or ulcers Psychiatry: Judgement and insight appear normal. Mood & affect appropriate.     Data Reviewed: I have personally reviewed  following labs and imaging studies  CBC: Recent Labs  Lab 02/27/24 0627 02/29/24 0810  WBC 6.4 4.7  NEUTROABS 3.9 2.5  HGB 12.4* 11.4*  HCT 37.4* 32.8*  MCV 89.3 87.0  PLT 219 206   Basic Metabolic Panel: Recent Labs  Lab 02/27/24 0627 02/28/24 0418 02/29/24 0810  NA 137 139 138  K 4.2 3.4* 3.6  CL 98 103 105  CO2 31 27 26   GLUCOSE 123* 125* 111*  BUN 41* 38* 27*  CREATININE 1.62* 1.39* 1.26*  CALCIUM 9.4 8.7* 8.7*   GFR: Estimated Creatinine Clearance: 56.3 mL/min (A) (by C-G formula based on SCr of 1.26 mg/dL (H)). Liver Function Tests: Recent Labs  Lab 02/27/24 0627  AST 20  ALT 12  ALKPHOS 41  BILITOT 0.6  PROT 6.9  ALBUMIN 3.4*   No results for input(s): "LIPASE", "AMYLASE" in the last 168 hours. No results for input(s): "AMMONIA" in the last 168 hours. Coagulation Profile: No results for input(s): "INR", "PROTIME" in the last 168 hours. Cardiac Enzymes: No results for input(s): "CKTOTAL", "CKMB", "CKMBINDEX", "TROPONINI" in the last 168 hours. BNP (last 3 results) No results for input(s): "PROBNP" in the last 8760 hours. HbA1C: Recent Labs    02/27/24 0627  HGBA1C 5.8*   CBG: Recent Labs  Lab 02/27/24 0617 02/28/24 0810 02/29/24 0920  GLUCAP 86 113* 89   Lipid Profile: No results for input(s): "CHOL", "HDL", "LDLCALC", "TRIG", "CHOLHDL", "LDLDIRECT" in the last 72 hours. Thyroid Function Tests: No results for input(s): "TSH", "T4TOTAL", "FREET4", "T3FREE", "THYROIDAB" in the last 72 hours. Anemia Panel: No results for input(s): "VITAMINB12", "FOLATE", "FERRITIN", "TIBC", "IRON", "RETICCTPCT" in the last 72 hours. Sepsis Labs: Recent Labs  Lab 02/27/24 1610 02/27/24 0823  LATICACIDVEN 1.2 0.9    Recent Results (from the past 240 hours)  Resp panel by RT-PCR (RSV, Flu A&B, Covid) Anterior Nasal Swab     Status: None   Collection Time: 02/27/24  6:28 AM   Specimen: Anterior Nasal Swab  Result Value Ref Range Status   SARS  Coronavirus 2 by RT PCR NEGATIVE NEGATIVE Final    Comment: (NOTE) SARS-CoV-2 target nucleic acids are NOT DETECTED.  The SARS-CoV-2 RNA is generally detectable in upper respiratory specimens during the acute phase of infection. The lowest concentration of SARS-CoV-2 viral copies this assay can detect is 138 copies/mL. A negative result does not preclude SARS-Cov-2 infection and should not be used as the sole basis for treatment or other patient management decisions. A negative result may occur with  improper specimen collection/handling, submission of specimen other than nasopharyngeal swab, presence of viral mutation(s) within the areas targeted by this assay, and inadequate number of viral copies(<138 copies/mL). A negative result must be combined with clinical observations, patient history, and epidemiological information. The expected result is Negative.  Fact Sheet for Patients:  BloggerCourse.com  Fact Sheet for Healthcare Providers:  SeriousBroker.it  This test is no t yet approved or cleared by the [place] FDA and  has been authorized for detection and/or diagnosis of SARS-CoV-2 by FDA under an Emergency Use Authorization (EUA). This EUA will remain  in effect (meaning this test  can be used) for the duration of the COVID-19 declaration under Section 564(b)(1) of the Act, 21 U.S.C.section 360bbb-3(b)(1), unless the authorization is terminated  or revoked sooner.       Influenza A by PCR NEGATIVE NEGATIVE Final   Influenza B by PCR NEGATIVE NEGATIVE Final    Comment: (NOTE) The Xpert Xpress SARS-CoV-2/FLU/RSV plus assay is intended as an aid in the diagnosis of influenza from Nasopharyngeal swab specimens and should not be used as a sole basis for treatment. Nasal washings and aspirates are unacceptable for Xpert Xpress SARS-CoV-2/FLU/RSV testing.  Fact Sheet for  Patients: BloggerCourse.com  Fact Sheet for Healthcare Providers: SeriousBroker.it  This test is not yet approved or cleared by the [place] FDA and has been authorized for detection and/or diagnosis of SARS-CoV-2 by FDA under an Emergency Use Authorization (EUA). This EUA will remain in effect (meaning this test can be used) for the duration of the COVID-19 declaration under Section 564(b)(1) of the Act, 21 U.S.C. section 360bbb-3(b)(1), unless the authorization is terminated or revoked.     Resp Syncytial Virus by PCR NEGATIVE NEGATIVE Final    Comment: (NOTE) Fact Sheet for Patients: BloggerCourse.com  Fact Sheet for Healthcare Providers: SeriousBroker.it  This test is not yet approved or cleared by the [place] FDA and has been authorized for detection and/or diagnosis of SARS-CoV-2 by FDA under an Emergency Use Authorization (EUA). This EUA will remain in effect (meaning this test can be used) for the duration of the COVID-19 declaration under Section 564(b)(1) of the Act, 21 U.S.C. section 360bbb-3(b)(1), unless the authorization is terminated or revoked.  Performed at [facility], [address], [place], [place] [address]          Radiology Studies: ECHOCARDIOGRAM COMPLETE Result Date: 02/29/2024    ECHOCARDIOGRAM REPORT   Patient Name:   [name] Date of Exam: 02/29/2024 Medical Rec #:  [id number]        Height:       67.0 in Accession #:    [id number]       Weight:       183.6 lb Date of Birth:  1950/12/25       BSA:          1.950 m Patient Age:    72 years         BP:           128/54 mmHg Patient Gender: M                HR:           69 bpm. Exam Location:  [facility] Procedure: 2D Echo, Cardiac Doppler and Color Doppler (Both Spectral and Color            Flow Doppler were utilized during procedure). Indications:     Syncope R55  History:          Patient has prior history of Echocardiogram examinations, most                  recent 01/13/2023. Risk Factors:Hypertension. Dementia.  Sonographer:     Cristela Blue Referring Phys:  [id number] Emeline General Diagnosing Phys: Lorine Bears MD IMPRESSIONS  1. Left ventricular ejection fraction, by estimation, is 55 to 60%. The left ventricle has normal function. The left ventricle has no regional wall motion abnormalities. There is moderate left ventricular hypertrophy. Left ventricular diastolic parameters were normal.  2. Right ventricular systolic function is normal. The right ventricular size is  normal. Tricuspid regurgitation signal is inadequate for assessing PA pressure.  3. The mitral valve is normal in structure. No evidence of mitral valve regurgitation. No evidence of mitral stenosis.  4. The aortic valve is normal in structure. Aortic valve regurgitation is trivial. Aortic valve sclerosis is present, with no evidence of aortic valve stenosis. FINDINGS  Left Ventricle: Left ventricular ejection fraction, by estimation, is 55 to 60%. The left ventricle has normal function. The left ventricle has no regional wall motion abnormalities. The left ventricular internal cavity size was normal in size. There is  moderate left ventricular hypertrophy. Left ventricular diastolic parameters were normal. Right Ventricle: The right ventricular size is normal. No increase in right ventricular wall thickness. Right ventricular systolic function is normal. Tricuspid regurgitation signal is inadequate for assessing PA pressure. Left Atrium: Left atrial size was normal in size. Right Atrium: Right atrial size was normal in size. Pericardium: There is no evidence of pericardial effusion. Mitral Valve: The mitral valve is normal in structure. No evidence of mitral valve regurgitation. No evidence of mitral valve stenosis. MV peak gradient, 4.0 mmHg. The mean mitral valve gradient is 2.0 mmHg. Tricuspid Valve: The tricuspid valve  is normal in structure. Tricuspid valve regurgitation is not demonstrated. No evidence of tricuspid stenosis. Aortic Valve: The aortic valve is normal in structure. Aortic valve regurgitation is trivial. Aortic valve sclerosis is present, with no evidence of aortic valve stenosis. Aortic valve mean gradient measures 3.0 mmHg. Aortic valve peak gradient measures 7.0 mmHg. Aortic valve area, by VTI measures 3.57 cm. Pulmonic Valve: The pulmonic valve was normal in structure. Pulmonic valve regurgitation is not visualized. No evidence of pulmonic stenosis. Aorta: The aortic root is normal in size and structure. Venous: The inferior vena cava was not well visualized. IAS/Shunts: No atrial level shunt detected by color flow Doppler.  LEFT VENTRICLE PLAX 2D LVIDd:         4.00 cm   Diastology LVIDs:         2.80 cm   LV e' medial:    10.60 cm/s LV PW:         1.50 cm   LV E/e' medial:  8.3 LV IVS:        1.60 cm   LV e' lateral:   15.10 cm/s LVOT diam:     2.00 cm   LV E/e' lateral: 5.8 LV SV:         83 LV SV Index:   43 LVOT Area:     3.14 cm  RIGHT VENTRICLE RV Basal diam:  3.30 cm RV Mid diam:    2.30 cm RV S prime:     10.80 cm/s TAPSE (M-mode): 2.2 cm LEFT ATRIUM             Index        RIGHT ATRIUM           Index LA diam:        3.50 cm 1.79 cm/m   RA Area:     11.90 cm LA Vol (A2C):   30.2 ml 15.49 ml/m  RA Volume:   22.20 ml  11.38 ml/m LA Vol (A4C):   38.5 ml 19.74 ml/m LA Biplane Vol: 34.0 ml 17.43 ml/m  AORTIC VALVE AV Area (Vmax):    2.78 cm AV Area (Vmean):   2.88 cm AV Area (VTI):     3.57 cm AV Vmax:           132.00 cm/s AV Vmean:  83.100 cm/s AV VTI:            0.232 m AV Peak Grad:      7.0 mmHg AV Mean Grad:      3.0 mmHg LVOT Vmax:         117.00 cm/s LVOT Vmean:        76.300 cm/s LVOT VTI:          0.264 m LVOT/AV VTI ratio: 1.14  AORTA Ao Root diam: 3.60 cm MITRAL VALVE               TRICUSPID VALVE MV Area (PHT): 3.28 cm    TR Peak grad:   11.2 mmHg MV Area VTI:   3.03 cm     TR Vmax:        167.00 cm/s MV Peak grad:  4.0 mmHg MV Mean grad:  2.0 mmHg    SHUNTS MV Vmax:       1.00 m/s    Systemic VTI:  0.26 m MV Vmean:      63.3 cm/s   Systemic Diam: 2.00 cm MV Decel Time: 231 msec MV E velocity: 87.50 cm/s MV A velocity: 82.80 cm/s MV E/A ratio:  1.06 Lorine Bears MD Electronically signed by Lorine Bears MD Signature Date/Time: 02/29/2024/9:24:45 AM    Final    DG Ankle Complete Right Result Date: 02/28/2024 CLINICAL DATA:  [id number] Pain [id number] EXAM: RIGHT ANKLE - COMPLETE 3+ VIEW COMPARISON:  February 27, 2024. FINDINGS: No acute fracture or dislocation. Ankle mortise is preserved. Scattered midfoot degenerative changes. Bidirectional calcaneal enthesophytes. No area of erosion or osseous destruction. No unexpected radiopaque foreign body. Vascular calcifications. IMPRESSION: 1. No acute fracture or dislocation. Electronically Signed   By: Meda Klinefelter M.D.   On: 02/28/2024 13:31        Scheduled Meds:  atorvastatin  40 mg Oral q1800   diclofenac Sodium  4 g Topical QID   donepezil  10 mg Oral QHS   enoxaparin (LOVENOX) injection  40 mg Subcutaneous Q24H   latanoprost  1 drop Both Eyes QHS   memantine  10 mg Oral BID   metFORMIN  500 mg Oral BID WC   methocarbamol  500 mg Oral TID   multivitamin with minerals  1 tablet Oral Daily   polyvinyl alcohol  1 drop Both Eyes q AM   potassium chloride SA  20 mEq Oral Daily   QUEtiapine  100 mg Oral QHS   sodium chloride flush  3 mL Intravenous Q12H   tamsulosin  0.4 mg Oral Daily   traZODone  150 mg Oral QHS   Continuous Infusions:     LOS: 1 day    Tresa Moore, MD [facility]   If 7PM-7AM, please contact night-coverage  02/29/2024, 2:28 PM

## 2024-02-29 NOTE — ED Notes (Signed)
 I Called daughter Loraine Maple to speak to her regarding patient fall. Family was understanding and appreciative of phone call.
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
## 2024-03-01 DIAGNOSIS — F03918 Unspecified dementia, unspecified severity, with other behavioral disturbance: Secondary | ICD-10-CM | POA: Diagnosis not present

## 2024-03-01 LAB — URIC ACID: Uric Acid, Serum: 6.2 mg/dL (ref 3.7–8.6)

## 2024-03-01 LAB — GLUCOSE, CAPILLARY: Glucose-Capillary: 100 mg/dL — ABNORMAL HIGH (ref 70–99)

## 2024-03-01 MED ORDER — PREDNISONE 20 MG PO TABS
40.0000 mg | ORAL_TABLET | Freq: Every day | ORAL | Status: DC
  Administered 2024-03-01 – 2024-03-03 (×3): 40 mg via ORAL
  Filled 2024-03-01 (×3): qty 2

## 2024-03-01 NOTE — Progress Notes (Signed)
 Physical Therapy Treatment Patient Details Name: [name] MRN: [id number] DOB: March 26, 1951 Today's Date: 03/01/2024   History of Present Illness [name] is a 73 year-old male with a known diagnosis of dementia who is presenting with worsening behavioral symptoms, including verbal threats and physical aggression toward family members.  Since presentation to ED, has had 3 reported episodes of syncope, now with significant pain to R foot (imaging negative); unable to WB or mobilize as result.    PT Comments  Pt was supine in bed with safety sitter at bedside. He is alert and agreeable to session however only truly oriented to self. He did not know day, year, time of day,etc. Pt unable to recall previous living situation. Pt does however fully participate but in a very limited session. Pt endorses R foot pain even at rest. However when author applied R sock, pt yells out in pain. Tender to touch. Pt unable to tolerate wt on RLE in standing. MD made aware. Pt did stand EOB 3 x to urinate but was unable.When pt stood, he did so without placing wt on RLE. Acute PT will continue to follow and progress per current POC. DC recs remains appropriate to maximize his independence and safety with all ADLs.     If plan is discharge home, recommend the following: A lot of help with walking and/or transfers;Assistance with cooking/housework;Direct supervision/assist for medications management;Direct supervision/assist for financial management;Assist for transportation;Help with stairs or ramp for entrance;Supervision due to cognitive status     Equipment Recommendations  Rolling walker (2 wheels)       Precautions / Restrictions Precautions Precautions: Fall Recall of Precautions/Restrictions: Impaired Restrictions Weight Bearing Restrictions Per Provider Order: No     Mobility  Bed Mobility Overal bed mobility: Modified Independent  General bed mobility comments: pt endorses severe R foot  pain even at rest. He was willing to attempt OOB activity.    Transfers Overall transfer level: Needs assistance Equipment used: Rolling walker (2 wheels) Transfers: Sit to/from Stand Sit to Stand: Contact guard assist, Min assist  General transfer comment: pt stood 3 x EOB however unable to tolerate any wt on RLE. c/o severe shooting pain in R foot with any wt placed. pt stood with BUE/LLE without LOB. Session severely limited by R foot pain. MD made aware. pt strength is great but overall limited by pain/cognition    Ambulation/Gait  General Gait Details: unable due to inability to tolerate wt on RLE.    Balance Overall balance assessment: Needs assistance   Sitting balance-Leahy Scale: Good Sitting balance - Comments: no LOB while seated EOB however unable to place RLE on floor due to pain   Standing balance support: Bilateral upper extremity supported Standing balance-Leahy Scale: Poor Standing balance comment: poor standing balance due to inability to tolerate wt acceptance on RLE.     Communication Communication Communication: No apparent difficulties  Cognition Arousal: Alert Behavior During Therapy: WFL for tasks assessed/performed   PT - Cognitive impairments: History of cognitive impairments, Orientation, Awareness   Orientation impairments: Place, Time, Situation    PT - Cognition Comments: pt is alert and able to follow commands however only oriented to self. Could not remember living situation Following commands: Intact      Cueing Cueing Techniques: Verbal cues         Pertinent Vitals/Pain Pain Assessment Pain Assessment: 0-10 Pain Score: 9  Pain Location: R foot Pain Descriptors / Indicators: Burning, Grimacing, Guarding, Sharp Pain Intervention(s): Limited activity within patient's tolerance,  Repositioned, Monitored during session     PT Goals (current goals can now be found in the care plan section) Acute Rehab PT Goals Patient Stated Goal: none  stated Progress towards PT goals: Not progressing toward goals - comment    Frequency    Min 2X/week       Co-evaluation     PT goals addressed during session: Mobility/safety with mobility;Balance;Proper use of DME;Strengthening/ROM        AM-PAC PT "6 Clicks" Mobility   Outcome Measure  Help needed turning from your back to your side while in a flat bed without using bedrails?: None Help needed moving from lying on your back to sitting on the side of a flat bed without using bedrails?: None Help needed moving to and from a bed to a chair (including a wheelchair)?: A Little Help needed standing up from a chair using your arms (e.g., wheelchair or bedside chair)?: A Little Help needed to walk in hospital room?: Total Help needed climbing 3-5 steps with a railing? : Total 6 Click Score: 16    End of Session   Activity Tolerance: Patient limited by pain Patient left: in bed;with call bell/phone within reach;with bed alarm set;with nursing/sitter in room Nurse Communication: Mobility status PT Visit Diagnosis: Muscle weakness (generalized) (M62.81);Pain Pain - part of body: Ankle and joints of foot     Time: 1052-1109 PT Time Calculation (min) (ACUTE ONLY): 17 min  Charges:    $Therapeutic Activity: 8-22 mins PT General Charges $$ ACUTE PT VISIT: 1 Visit                     Jetta Lout PTA 03/01/24, 12:19 PM

## 2024-03-01 NOTE — Progress Notes (Signed)
 PROGRESS NOTE    [name]  UJW:[id number] DOB: 1951/07/04 DOA: 02/19/2024 PCP: Raymon Mutton., FNP    Brief Narrative:  73 y.o. male with medical history significant of cryptogenic stroke on loop recorder, CVA, dementia with behavioral disturbance, HTN, IIDM, DM neuropathy, BPH, who was found to have syncope episode x 3 during ED stay.   Patient initially brought to ED by family member for evaluation of worsening of agitation and mental declining on February 28.  Subsequently patient has been staying in the ED for the last 10+ days and unable to discharge back home due to increasing risk of mentation deterioration.     Last 2 days, ED physician witnessed at least 3 episodes of syncope.  Unfortunately patient was not on telemonitoring as time but ED staff reported that the patient suddenly became unresponsive symptoms lasted for about 10 seconds and patient recovers consciousness.  Patient does not remember any prodromes however due to ED record, patient did complain about lightheadedness before he lost consciousness.  No seizure activity reported.  ED workup showed hemoglobin 12.4, WBC 6.4, creatinine 1.3 compared to baseline 1.1-1.3, BUN 41  3/11: Patient's right foot pain has worsened in severity preventing effective therapy efforts   Assessment & Plan:   Principal Problem:   Aggressive behavior due to dementia [facility]) Active Problems:   Syncope  Syncope -Patient appears to be volume contracted with dark-colored urine, and increasing BUN/creatinine ratio, and some level of AKI.  Kidney function improved.  At or near baseline.  Echocardiogram normal Plan: Encourage p.o. fluid intake Fall precautions Therapy evaluations as necessary  AKI Suspect prerenal azotemia in the setting of Lasix, hydrochlorothiazide, losartan use3 Renal ultrasound reassuring.  Kidney function improving.  Suggesting prerenal azotemia. Plan: Continue to hold Lasix, HCTZ, losartan.  BP has been  reasonably controlled Continue Coreg As needed IV hydralazine  Right foot pain Unknown etiology.  Right foot x-ray imaging survey overall reassuring.  No fractures or dislocations.  Physical exam concerning for possible gout flare. Plan: Try prednisone 40 mg daily until improvement in symptoms then taper Check uric acid level   HTN -As above   IIDM -Continue metformin -Hemoglobin A1c 5.8.  Good control   Dementia with behavioral disturbance -Continue memantine and Aricept -as per psychiatry team  PPD read by RN and MD on 3/11.  Negative.  0 mm induration   DVT prophylaxis: SQ Lovenox Code Status: Full Family Communication: None Disposition Plan: Status is: Inpatient Remains inpatient appropriate because: Unsafe discharge plan.  Will need skilled nursing facility prior to admission to memory care unit.     Level of care: Telemetry Medical  Consultants:  None  Procedures:  None  Antimicrobials: None   Subjective: Seen and examined.  Pleasant calm and cooperative.  No distress.  Sitter at bedside.  Objective: Vitals:   02/29/24 1614 02/29/24 1932 03/01/24 0427 03/01/24 0800  BP: (!) 152/78 (!) 152/66 (!) 142/62 (!) 129/59  Pulse: 77 72 (!) 59 63  Resp: 16   16  Temp: 98.4 F (36.9 C) 99.1 F (37.3 C) 98.6 F (37 C) 98.1 F (36.7 C)  TempSrc: Oral     SpO2: 99% 100% 99% 100%  Weight:      Height:        Intake/Output Summary (Last 24 hours) at 03/01/2024 1342 Last data filed at 03/01/2024 0858 Gross per 24 hour  Intake 550 ml  Output 1000 ml  Net -450 ml   Filed Weights   02/27/24  1700 02/28/24 0500 02/29/24 0500  Weight: 82.8 kg 83.5 kg 83.3 kg    Examination:  General exam: No acute distress Respiratory system: Clear to auscultation. Respiratory effort normal. Cardiovascular system: S1-S2, RRR, no murmurs, no pedal edema Gastrointestinal system: Soft, NT/ND, normal bowel sounds Central nervous system: Alert.  Oriented x 2.  No focal  deficits Extremities: Symmetric 5 x 5 power.  Right foot tender to touch Skin: No rashes, lesions or ulcers Psychiatry: Judgement and insight appear normal. Mood & affect appropriate.     Data Reviewed: I have personally reviewed following labs and imaging studies  CBC: Recent Labs  Lab 02/27/24 0627 02/29/24 0810  WBC 6.4 4.7  NEUTROABS 3.9 2.5  HGB 12.4* 11.4*  HCT 37.4* 32.8*  MCV 89.3 87.0  PLT 219 206   Basic Metabolic Panel: Recent Labs  Lab 02/27/24 0627 02/28/24 0418 02/29/24 0810  NA 137 139 138  K 4.2 3.4* 3.6  CL 98 103 105  CO2 31 27 26   GLUCOSE 123* 125* 111*  BUN 41* 38* 27*  CREATININE 1.62* 1.39* 1.26*  CALCIUM 9.4 8.7* 8.7*   GFR: Estimated Creatinine Clearance: 56.3 mL/min (A) (by C-G formula based on SCr of 1.26 mg/dL (H)). Liver Function Tests: Recent Labs  Lab 02/27/24 0627  AST 20  ALT 12  ALKPHOS 41  BILITOT 0.6  PROT 6.9  ALBUMIN 3.4*   No results for input(s): "LIPASE", "AMYLASE" in the last 168 hours. No results for input(s): "AMMONIA" in the last 168 hours. Coagulation Profile: No results for input(s): "INR", "PROTIME" in the last 168 hours. Cardiac Enzymes: No results for input(s): "CKTOTAL", "CKMB", "CKMBINDEX", "TROPONINI" in the last 168 hours. BNP (last 3 results) No results for input(s): "PROBNP" in the last 8760 hours. HbA1C: No results for input(s): "HGBA1C" in the last 72 hours.  CBG: Recent Labs  Lab 02/27/24 0617 02/28/24 0810 02/29/24 0920 03/01/24 0810  GLUCAP 86 113* 89 100*   Lipid Profile: No results for input(s): "CHOL", "HDL", "LDLCALC", "TRIG", "CHOLHDL", "LDLDIRECT" in the last 72 hours. Thyroid Function Tests: No results for input(s): "TSH", "T4TOTAL", "FREET4", "T3FREE", "THYROIDAB" in the last 72 hours. Anemia Panel: No results for input(s): "VITAMINB12", "FOLATE", "FERRITIN", "TIBC", "IRON", "RETICCTPCT" in the last 72 hours. Sepsis Labs: Recent Labs  Lab 02/27/24 6295 02/27/24 0823   LATICACIDVEN 1.2 0.9    Recent Results (from the past 240 hours)  Resp panel by RT-PCR (RSV, Flu A&B, Covid) Anterior Nasal Swab     Status: None   Collection Time: 02/27/24  6:28 AM   Specimen: Anterior Nasal Swab  Result Value Ref Range Status   SARS Coronavirus 2 by RT PCR NEGATIVE NEGATIVE Final    Comment: (NOTE) SARS-CoV-2 target nucleic acids are NOT DETECTED.  The SARS-CoV-2 RNA is generally detectable in upper respiratory specimens during the acute phase of infection. The lowest concentration of SARS-CoV-2 viral copies this assay can detect is 138 copies/mL. A negative result does not preclude SARS-Cov-2 infection and should not be used as the sole basis for treatment or other patient management decisions. A negative result may occur with  improper specimen collection/handling, submission of specimen other than nasopharyngeal swab, presence of viral mutation(s) within the areas targeted by this assay, and inadequate number of viral copies(<138 copies/mL). A negative result must be combined with clinical observations, patient history, and epidemiological information. The expected result is Negative.  Fact Sheet for Patients:  BloggerCourse.com  Fact Sheet for Healthcare Providers:  SeriousBroker.it  This test  is no t yet approved or cleared by the [place] and  has been authorized for detection and/or diagnosis of SARS-CoV-2 by FDA under an Emergency Use Authorization (EUA). This EUA will remain  in effect (meaning this test can be used) for the duration of the COVID-19 declaration under Section 564(b)(1) of the Act, 21 U.S.C.section 360bbb-3(b)(1), unless the authorization is terminated  or revoked sooner.       Influenza A by PCR NEGATIVE NEGATIVE Final   Influenza B by PCR NEGATIVE NEGATIVE Final    Comment: (NOTE) The Xpert Xpress SARS-CoV-2/FLU/RSV plus assay is intended as an aid in the diagnosis of  influenza from Nasopharyngeal swab specimens and should not be used as a sole basis for treatment. Nasal washings and aspirates are unacceptable for Xpert Xpress SARS-CoV-2/FLU/RSV testing.  Fact Sheet for Patients: BloggerCourse.com  Fact Sheet for Healthcare Providers: SeriousBroker.it  This test is not yet approved or cleared by the [place] FDA and has been authorized for detection and/or diagnosis of SARS-CoV-2 by FDA under an Emergency Use Authorization (EUA). This EUA will remain in effect (meaning this test can be used) for the duration of the COVID-19 declaration under Section 564(b)(1) of the Act, 21 U.S.C. section 360bbb-3(b)(1), unless the authorization is terminated or revoked.     Resp Syncytial Virus by PCR NEGATIVE NEGATIVE Final    Comment: (NOTE) Fact Sheet for Patients: BloggerCourse.com  Fact Sheet for Healthcare Providers: SeriousBroker.it  This test is not yet approved or cleared by the [place] FDA and has been authorized for detection and/or diagnosis of SARS-CoV-2 by FDA under an Emergency Use Authorization (EUA). This EUA will remain in effect (meaning this test can be used) for the duration of the COVID-19 declaration under Section 564(b)(1) of the Act, 21 U.S.C. section 360bbb-3(b)(1), unless the authorization is terminated or revoked.  Performed at [facility], [address] [place], [place] [address]          Radiology Studies: ECHOCARDIOGRAM COMPLETE Result Date: 02/29/2024    ECHOCARDIOGRAM REPORT   Patient Name:   [name] Muhs Date of Exam: 02/29/2024 Medical Rec #:  [id number]        Height:       67.0 in Accession #:    [id number]       Weight:       183.6 lb Date of Birth:  [birth year]       BSA:          1.950 m Patient Age:    72 years         BP:           128/54 mmHg Patient Gender: M                HR:            69 bpm. Exam Location:  [facility] Procedure: 2D Echo, Cardiac Doppler and Color Doppler (Both Spectral and Color            Flow Doppler were utilized during procedure). Indications:     Syncope R55  History:         Patient has prior history of Echocardiogram examinations, most                  recent 01/13/2023. Risk Factors:Hypertension. Dementia.  Sonographer:     Cristela Blue Referring Phys:  [id number] Emeline General Diagnosing Phys: Lorine Bears MD IMPRESSIONS  1. Left ventricular ejection fraction, by estimation, is 55 to  60%. The left ventricle has normal function. The left ventricle has no regional wall motion abnormalities. There is moderate left ventricular hypertrophy. Left ventricular diastolic parameters were normal.  2. Right ventricular systolic function is normal. The right ventricular size is normal. Tricuspid regurgitation signal is inadequate for assessing PA pressure.  3. The mitral valve is normal in structure. No evidence of mitral valve regurgitation. No evidence of mitral stenosis.  4. The aortic valve is normal in structure. Aortic valve regurgitation is trivial. Aortic valve sclerosis is present, with no evidence of aortic valve stenosis. FINDINGS  Left Ventricle: Left ventricular ejection fraction, by estimation, is 55 to 60%. The left ventricle has normal function. The left ventricle has no regional wall motion abnormalities. The left ventricular internal cavity size was normal in size. There is  moderate left ventricular hypertrophy. Left ventricular diastolic parameters were normal. Right Ventricle: The right ventricular size is normal. No increase in right ventricular wall thickness. Right ventricular systolic function is normal. Tricuspid regurgitation signal is inadequate for assessing PA pressure. Left Atrium: Left atrial size was normal in size. Right Atrium: Right atrial size was normal in size. Pericardium: There is no evidence of pericardial effusion. Mitral Valve: The mitral valve is  normal in structure. No evidence of mitral valve regurgitation. No evidence of mitral valve stenosis. MV peak gradient, 4.0 mmHg. The mean mitral valve gradient is 2.0 mmHg. Tricuspid Valve: The tricuspid valve is normal in structure. Tricuspid valve regurgitation is not demonstrated. No evidence of tricuspid stenosis. Aortic Valve: The aortic valve is normal in structure. Aortic valve regurgitation is trivial. Aortic valve sclerosis is present, with no evidence of aortic valve stenosis. Aortic valve mean gradient measures 3.0 mmHg. Aortic valve peak gradient measures 7.0 mmHg. Aortic valve area, by VTI measures 3.57 cm. Pulmonic Valve: The pulmonic valve was normal in structure. Pulmonic valve regurgitation is not visualized. No evidence of pulmonic stenosis. Aorta: The aortic root is normal in size and structure. Venous: The inferior vena cava was not well visualized. IAS/Shunts: No atrial level shunt detected by color flow Doppler.  LEFT VENTRICLE PLAX 2D LVIDd:         4.00 cm   Diastology LVIDs:         2.80 cm   LV e' medial:    10.60 cm/s LV PW:         1.50 cm   LV E/e' medial:  8.3 LV IVS:        1.60 cm   LV e' lateral:   15.10 cm/s LVOT diam:     2.00 cm   LV E/e' lateral: 5.8 LV SV:         83 LV SV Index:   43 LVOT Area:     3.14 cm  RIGHT VENTRICLE RV Basal diam:  3.30 cm RV Mid diam:    2.30 cm RV S prime:     10.80 cm/s TAPSE (M-mode): 2.2 cm LEFT ATRIUM             Index        RIGHT ATRIUM           Index LA diam:        3.50 cm 1.79 cm/m   RA Area:     11.90 cm LA Vol (A2C):   30.2 ml 15.49 ml/m  RA Volume:   22.20 ml  11.38 ml/m LA Vol (A4C):   38.5 ml 19.74 ml/m LA Biplane Vol: 34.0 ml 17.43 ml/m  AORTIC  VALVE AV Area (Vmax):    2.78 cm AV Area (Vmean):   2.88 cm AV Area (VTI):     3.57 cm AV Vmax:           132.00 cm/s AV Vmean:          83.100 cm/s AV VTI:            0.232 m AV Peak Grad:      7.0 mmHg AV Mean Grad:      3.0 mmHg LVOT Vmax:         117.00 cm/s LVOT Vmean:         76.300 cm/s LVOT VTI:          0.264 m LVOT/AV VTI ratio: 1.14  AORTA Ao Root diam: 3.60 cm MITRAL VALVE               TRICUSPID VALVE MV Area (PHT): 3.28 cm    TR Peak grad:   11.2 mmHg MV Area VTI:   3.03 cm    TR Vmax:        167.00 cm/s MV Peak grad:  4.0 mmHg MV Mean grad:  2.0 mmHg    SHUNTS MV Vmax:       1.00 m/s    Systemic VTI:  0.26 m MV Vmean:      63.3 cm/s   Systemic Diam: 2.00 cm MV Decel Time: 231 msec MV E velocity: 87.50 cm/s MV A velocity: 82.80 cm/s MV E/A ratio:  1.06 Lorine Bears MD Electronically signed by Lorine Bears MD Signature Date/Time: 02/29/2024/9:24:45 AM    Final         Scheduled Meds:  atorvastatin  40 mg Oral q1800   diclofenac Sodium  4 g Topical QID   donepezil  10 mg Oral QHS   enoxaparin (LOVENOX) injection  40 mg Subcutaneous Q24H   latanoprost  1 drop Both Eyes QHS   memantine  10 mg Oral BID   metFORMIN  500 mg Oral BID WC   methocarbamol  500 mg Oral TID   multivitamin with minerals  1 tablet Oral Daily   polyvinyl alcohol  1 drop Both Eyes q AM   potassium chloride SA  20 mEq Oral Daily   predniSONE  40 mg Oral Q breakfast   QUEtiapine  100 mg Oral QHS   sodium chloride flush  3 mL Intravenous Q12H   tamsulosin  0.4 mg Oral Daily   traZODone  150 mg Oral QHS   Continuous Infusions:     LOS: 2 days    Tresa Moore, MD [facility]   If 7PM-7AM, please contact night-coverage  03/01/2024, 1:42 PM

## 2024-03-01 NOTE — Progress Notes (Signed)
 TB skin test read- negative, 0mm

## 2024-03-01 NOTE — TOC Progression Note (Signed)
 Transition of Care [facility]) - Progression Note    Patient Details  Name: [name] MRN: [id number] Date of Birth: 21-May-1951  [facility] CM/SW Contact  Erin Sons, [place] Phone Number: 03/01/2024, 2:18 PM  Clinical Narrative:     CSW called pt's daughter to discuss disposition. She states plan is for pt to admit to [facility] for LTC on 03/09/24. CSW explained updated PT rec for STR at SNF. She is agreeable for SNF w/u; will aim to have pt rehab at [facility] and then go to Memory Care/LTC from there. Fl2 completed and bed requests sent in hub.   Expected Discharge Plan: Memory Care Barriers to Discharge: Continued Medical Work up  Expected Discharge Plan and Services     Post Acute Care Choice:  (Memory Care) Living arrangements for the past 2 months: Single Family Home                                       Social Determinants of Health (SDOH) Interventions SDOH Screenings   Food Insecurity: Patient Unable To Answer (02/27/2024)  Housing: Patient Unable To Answer (02/27/2024)  Transportation Needs: Patient Unable To Answer (02/27/2024)  Utilities: Patient Unable To Answer (02/27/2024)  Social Connections: Unknown (02/27/2024)  Tobacco Use: Low Risk  (02/28/2024)    Readmission Risk Interventions     No data to display

## 2024-03-01 NOTE — [place] FL2 (Signed)
 [place] MEDICAID FL2 LEVEL OF CARE FORM     IDENTIFICATION  Patient Name: [name] Birthdate: March 31, 1951 Sex: male Admission Date (Current Location): 02/19/2024  [facility] and [place] Number:  Producer, television/film/video and Address:  [facility], [address] [place], [place] [address]      Provider Number: [id number]  Attending Physician Name and Address:  Tresa Moore, MD  Relative Name and Phone Number:       Current Level of Care: Hospital Recommended Level of Care: Skilled Nursing Facility Prior Approval Number:    Date Approved/Denied:   PASRR Number: [id number]  Discharge Plan: SNF    Current Diagnoses: Patient Active Problem List   Diagnosis Date Noted   Syncope 02/27/2024   Aggressive behavior due to dementia [facility]) 02/19/2024   Memory change 11/20/2020   Acute cerebrovascular accident (CVA) (HCC) 01/20/2020   Benign essential HTN 01/20/2020   Left homonymous hemianopsia 12/23/2019    Orientation RESPIRATION BLADDER Height & Weight     Self, Place  Normal Continent Weight: 183 lb 10.3 oz (83.3 kg) Height:  5\' 7"  (170.2 cm)  BEHAVIORAL SYMPTOMS/MOOD NEUROLOGICAL BOWEL NUTRITION STATUS   (Cooperative, calm.)   Continent Diet (see d/c summary)  AMBULATORY STATUS COMMUNICATION OF NEEDS Skin   Extensive Assist Verbally Other (Comment) (skin tear left leg)                       Personal Care Assistance Level of Assistance  Bathing, Feeding, Dressing Bathing Assistance: Maximum assistance Feeding assistance: Independent Dressing Assistance: Limited assistance     Functional Limitations Info  Sight, Hearing, Speech Sight Info: Adequate Hearing Info: Adequate Speech Info: Adequate    SPECIAL CARE FACTORS FREQUENCY  PT (By licensed PT), OT (By licensed OT)     PT Frequency: 5x/week OT Frequency: 5x/week            Contractures Contractures Info: Not present    Additional Factors Info  Code  Status, Allergies Code Status Info: full code Allergies Info: shellfish allergy           Current Medications (03/01/2024):  This is the current hospital active medication list Current Facility-Administered Medications  Medication Dose Route Frequency Provider Last Rate Last Admin   atorvastatin (LIPITOR) tablet 40 mg  40 mg Oral q1800 Merwyn Katos, MD   40 mg at 02/29/24 1830   diclofenac Sodium (VOLTAREN) 1 % topical gel 4 g  4 g Topical QID Lolita Patella B, MD   4 g at 03/01/24 0824   donepezil (ARICEPT) tablet 10 mg  10 mg Oral QHS Merwyn Katos, MD   10 mg at 02/29/24 2306   enoxaparin (LOVENOX) injection 40 mg  40 mg Subcutaneous Q24H Mikey College T, MD   40 mg at 02/29/24 2306   hydrALAZINE (APRESOLINE) injection 5 mg  5 mg Intravenous Q6H PRN Mikey College T, MD       latanoprost (XALATAN) 0.005 % ophthalmic solution 1 drop  1 drop Both Eyes QHS Merwyn Katos, MD   1 drop at 02/29/24 2307   memantine (NAMENDA) tablet 10 mg  10 mg Oral BID Merwyn Katos, MD   10 mg at 03/01/24 5621   metFORMIN (GLUCOPHAGE) tablet 500 mg  500 mg Oral BID WC Merwyn Katos, MD   500 mg at 03/01/24 3086   methocarbamol (ROBAXIN) tablet 500 mg  500 mg Oral TID Tresa Moore, MD   500  mg at 03/01/24 4696   multivitamin with minerals tablet 1 tablet  1 tablet Oral Daily Merwyn Katos, MD   1 tablet at 03/01/24 0821   OLANZapine (ZYPREXA) injection 2.5 mg  2.5 mg Intramuscular Q6H PRN Mikey College T, MD   2.5 mg at 02/28/24 1028   polyvinyl alcohol (LIQUIFILM TEARS) 1.4 % ophthalmic solution 1 drop  1 drop Both Eyes q AM Merwyn Katos, MD   1 drop at 03/01/24 0823   potassium chloride SA (KLOR-CON M) CR tablet 20 mEq  20 mEq Oral Daily Merwyn Katos, MD   20 mEq at 03/01/24 2952   predniSONE (DELTASONE) tablet 40 mg  40 mg Oral Q breakfast Lolita Patella B, MD   40 mg at 03/01/24 1208   QUEtiapine (SEROQUEL) tablet 100 mg  100 mg Oral QHS Juliann Pares, NP   100 mg at 02/29/24  2305   sodium chloride flush (NS) 0.9 % injection 3 mL  3 mL Intravenous Q12H Mikey College T, MD   3 mL at 03/01/24 8413   tamsulosin (FLOMAX) capsule 0.4 mg  0.4 mg Oral Daily Merwyn Katos, MD   0.4 mg at 03/01/24 2440   traZODone (DESYREL) tablet 150 mg  150 mg Oral QHS Merwyn Katos, MD   150 mg at 02/29/24 2306   ziprasidone (GEODON) injection 10 mg  10 mg Intramuscular Q6H PRN Lolita Patella B, MD   10 mg at 02/28/24 1342     Discharge Medications: Please see discharge summary for a list of discharge medications.  Relevant Imaging Results:  Relevant Lab Results:   Additional Information SS#: [id number]  Erin Sons, LCSW

## 2024-03-02 ENCOUNTER — Encounter (INDEPENDENT_AMBULATORY_CARE_PROVIDER_SITE_OTHER): Payer: Medicare HMO | Admitting: Ophthalmology

## 2024-03-02 DIAGNOSIS — I10 Essential (primary) hypertension: Secondary | ICD-10-CM

## 2024-03-02 DIAGNOSIS — H04123 Dry eye syndrome of bilateral lacrimal glands: Secondary | ICD-10-CM

## 2024-03-02 DIAGNOSIS — F03918 Unspecified dementia, unspecified severity, with other behavioral disturbance: Secondary | ICD-10-CM | POA: Diagnosis not present

## 2024-03-02 DIAGNOSIS — E119 Type 2 diabetes mellitus without complications: Secondary | ICD-10-CM

## 2024-03-02 DIAGNOSIS — H35033 Hypertensive retinopathy, bilateral: Secondary | ICD-10-CM

## 2024-03-02 DIAGNOSIS — Z961 Presence of intraocular lens: Secondary | ICD-10-CM

## 2024-03-02 DIAGNOSIS — E113313 Type 2 diabetes mellitus with moderate nonproliferative diabetic retinopathy with macular edema, bilateral: Secondary | ICD-10-CM

## 2024-03-02 DIAGNOSIS — B0052 Herpesviral keratitis: Secondary | ICD-10-CM

## 2024-03-02 DIAGNOSIS — H33321 Round hole, right eye: Secondary | ICD-10-CM

## 2024-03-02 LAB — BASIC METABOLIC PANEL
Anion gap: 6 (ref 5–15)
BUN: 29 mg/dL — ABNORMAL HIGH (ref 8–23)
CO2: 26 mmol/L (ref 22–32)
Calcium: 8.6 mg/dL — ABNORMAL LOW (ref 8.9–10.3)
Chloride: 105 mmol/L (ref 98–111)
Creatinine, Ser: 1.21 mg/dL (ref 0.61–1.24)
GFR, Estimated: >60 mL/min (ref >60)
Glucose, Bld: 142 mg/dL — ABNORMAL HIGH (ref 70–99)
Potassium: 4.1 mmol/L (ref 3.5–5.1)
Sodium: 137 mmol/L (ref 135–145)

## 2024-03-02 LAB — CBC WITH DIFFERENTIAL/PLATELET
Abs Immature Granulocytes: 0.02 10*3/uL (ref 0.00–0.07)
Basophils Absolute: 0 10*3/uL (ref 0.0–0.1)
Basophils Relative: 0 %
Eosinophils Absolute: 0 10*3/uL (ref 0.0–0.5)
Eosinophils Relative: 0 %
HCT: 29.7 % — ABNORMAL LOW (ref 39.0–52.0)
Hemoglobin: 10.3 g/dL — ABNORMAL LOW (ref 13.0–17.0)
Immature Granulocytes: 0 %
Lymphocytes Relative: 18 %
Lymphs Abs: 1.2 10*3/uL (ref 0.7–4.0)
MCH: 29.9 pg (ref 26.0–34.0)
MCHC: 34.7 g/dL (ref 30.0–36.0)
MCV: 86.1 fL (ref 80.0–100.0)
Monocytes Absolute: 0.5 10*3/uL (ref 0.1–1.0)
Monocytes Relative: 8 %
Neutro Abs: 5.3 10*3/uL (ref 1.7–7.7)
Neutrophils Relative %: 74 %
Platelets: 237 10*3/uL (ref 150–400)
RBC: 3.45 MIL/uL — ABNORMAL LOW (ref 4.22–5.81)
RDW: 12.6 % (ref 11.5–15.5)
WBC: 7.1 10*3/uL (ref 4.0–10.5)
nRBC: 0 % (ref 0.0–0.2)

## 2024-03-02 LAB — GLUCOSE, CAPILLARY: Glucose-Capillary: 152 mg/dL — ABNORMAL HIGH (ref 70–99)

## 2024-03-02 NOTE — Progress Notes (Signed)
 Physical Therapy Treatment Patient Details Name: [name] MRN: [id number] DOB: 10-Jul-1951 Today's Date: 03/02/2024   History of Present Illness [name] is a 73 year-old male with a known diagnosis of dementia who is presenting with worsening behavioral symptoms, including verbal threats and physical aggression toward family members.  Since presentation to ED, has had 3 reported episodes of syncope, now with significant pain to R foot (imaging negative); unable to WB or mobilize as result.    PT Comments  Pt was long sitting in bed upon arrival. He is pleasantly confused and only oriented to self. Does follow simple commands consistently throughout. Pt endorses needing to have BM. He requires assistance to safely exit bed, stand to RW, and progress to taking steps from EOB to [facility]. Pt demonstrated much improved wt acceptance on RLE versus previous date however still endorses some pain in wt bearing. Pt unsuccessful BM on BSC however session proceeded to standing and ambulating ~ 8 ft with RW + max vcs for sequencing. Pt's cognition greatly impacts pt's abilities and safety. DC recs remain appropriate to maximize his independence and safety with all ADLs. Acute PT will continue to follow per current POC.     If plan is discharge home, recommend the following: Assistance with cooking/housework;Direct supervision/assist for medications management;Direct supervision/assist for financial management;Assist for transportation;Help with stairs or ramp for entrance;Supervision due to cognitive status;A lot of help with bathing/dressing/bathroom;A little help with walking and/or transfers     Equipment Recommendations  Rolling walker (2 wheels);BSC/3in1       Precautions / Restrictions Precautions Precautions: Fall Recall of Precautions/Restrictions: Impaired Restrictions Weight Bearing Restrictions Per Provider Order: No     Mobility  Bed Mobility Overal bed mobility: Needs  Assistance Bed Mobility: Supine to Sit  Supine to sit: Min assist  General bed mobility comments: pt required min assist mostly due to poor motor planning/cognition deficits    Transfers Overall transfer level: Needs assistance Equipment used: Rolling walker (2 wheels) Transfers: Sit to/from Stand Sit to Stand: Contact guard assist, Min assist  General transfer comment: Pt stood 3 x EOB and 1 x from [facility]    Ambulation/Gait Ambulation/Gait assistance: Contact guard assist, Min assist Gait Distance (Feet): 8 Feet Assistive device: Rolling walker (2 wheels) Gait Pattern/deviations: Step-to pattern, Trunk flexed, Antalgic Gait velocity: decreased  General Gait Details: Pt endorses RLE foot pain with wt bearing however much improved abilities to accept wt on RLE. previous date, pt unable to take steps due to RLE pain. today, pt was able to ambulate ~ 8 ft with RW from [facility] to recliner. pt has poor safety awareness throughout session with constant vcs for sequencing and safety.   Balance Overall balance assessment: Needs assistance Sitting-balance support: No upper extremity supported, Feet supported Sitting balance-Leahy Scale: Good     Standing balance support: Bilateral upper extremity supported, During functional activity, Reliant on assistive device for balance Standing balance-Leahy Scale: Poor Standing balance comment: pt remains high fall risk mostly due to cognition and poor safety awareness. safety sitter still in place at conclusion of PT session         Communication Communication Communication: No apparent difficulties  Cognition Arousal: Alert Behavior During Therapy: WFL for tasks assessed/performed   PT - Cognitive impairments: History of cognitive impairments, Orientation, Awareness   Orientation impairments: Place, Time, Situation      PT - Cognition Comments: pt is alert but remains only oriented to self. Does follow commands and is overall pleasantly  confused  Following commands: Intact      Cueing Cueing Techniques: Verbal cues         Pertinent Vitals/Pain Pain Assessment Pain Assessment: PAINAD Breathing: normal Negative Vocalization: occasional moan/groan, low speech, negative/disapproving quality Facial Expression: sad, frightened, frown Body Language: relaxed Consolability: no need to console PAINAD Score: 2 Pain Location: R foot Pain Descriptors / Indicators: Burning, Grimacing, Guarding, Sharp Pain Intervention(s): Limited activity within patient's tolerance, Monitored during session, Premedicated before session, Repositioned     PT Goals (current goals can now be found in the care plan section) Acute Rehab PT Goals Patient Stated Goal: none stated Progress towards PT goals: Progressing toward goals    Frequency    Min 2X/week       Co-evaluation     PT goals addressed during session: Mobility/safety with mobility;Balance;Proper use of DME;Strengthening/ROM        AM-PAC PT "6 Clicks" Mobility   Outcome Measure  Help needed turning from your back to your side while in a flat bed without using bedrails?: None Help needed moving from lying on your back to sitting on the side of a flat bed without using bedrails?: A Little Help needed moving to and from a bed to a chair (including a wheelchair)?: A Little Help needed standing up from a chair using your arms (e.g., wheelchair or bedside chair)?: A Little Help needed to walk in hospital room?: A Little Help needed climbing 3-5 steps with a railing? : A Lot 6 Click Score: 18    End of Session   Activity Tolerance: Patient tolerated treatment well Patient left: in chair;with call bell/phone within reach;with chair alarm set;with nursing/sitter in room Nurse Communication: Mobility status PT Visit Diagnosis: Muscle weakness (generalized) (M62.81);Pain Pain - Right/Left: Right Pain - part of body: Ankle and joints of foot     Time: 1010-1029 PT Time  Calculation (min) (ACUTE ONLY): 19 min  Charges:    $Therapeutic Activity: 8-22 mins PT General Charges $$ ACUTE PT VISIT: 1 Visit                    Jetta Lout PTA 03/02/24, 10:45 AM

## 2024-03-02 NOTE — Progress Notes (Signed)
 Progress Note   Patient: [name] VHQ:[id number] DOB: Aug 09, 1951 DOA: 02/19/2024     3 DOS: the patient was seen and examined on 03/02/2024   Brief hospital course: 73 y.o. male with medical history significant of cryptogenic stroke on loop recorder, CVA, dementia with behavioral disturbance, HTN, IIDM, DM neuropathy, BPH, who was found to have syncope episode x 3 during ED stay.   Patient initially brought to ED by family member for evaluation of worsening of agitation and mental declining on February 28.  Subsequently patient has been staying in the ED for the last 10+ days and unable to discharge back home due to increasing risk of mentation deterioration.     Last 2 days, ED physician witnessed at least 3 episodes of syncope.  Unfortunately patient was not on telemonitoring as time but ED staff reported that the patient suddenly became unresponsive symptoms lasted for about 10 seconds and patient recovers consciousness.  Patient does not remember any prodromes however due to ED record, patient did complain about lightheadedness before he lost consciousness.  No seizure activity reported.  ED workup showed hemoglobin 12.4, WBC 6.4, creatinine 1.3 compared to baseline 1.1-1.3, BUN 41   3/11: Patient's right foot pain has worsened in severity preventing effective therapy efforts   3/12: No new complaints    Assessment and Plan:  Principal Problem:   Aggressive behavior due to dementia [facility]) Active Problems:   Syncope   Syncope Secondary to volume depletion Patient appeared to be volume contracted with dark-colored urine, and increasing BUN/creatinine ratio, and some level of AKI.   Kidney function improved with hydration and is back to baseline.  Echocardiogram is normal Plan: Encourage p.o. fluid intake Fall precautions Therapy evaluations as necessary    AKI Suspect prerenal azotemia in the setting of Lasix, hydrochlorothiazide, losartan use Renal ultrasound  reassuring.  Kidney function improving.  Suggesting prerenal azotemia. Plan: Continue to hold Lasix, HCTZ, losartan.  BP has been reasonably controlled As needed IV hydralazine    Right foot pain Unknown etiology.  Right foot x-ray imaging survey overall reassuring.  No fractures or dislocations.   Physical exam concerning for possible gout flare. Plan: Improved pain control on Prednisone 40 mg daily  Continue prednisone to complete a 3-day course of therapy until improvement in symptoms then taper Uric acid level is within normal limits    HTN -Patient on amlodipine and uptitrate to optimize blood pressure control   IIDM -Continue metformin -Hemoglobin A1c 5.8.  Good control   Dementia with behavioral disturbance -Continue memantine and Aricept -as per psychiatry team   PPD read by RN and MD on 3/11.  Negative.  0 mm induration            Subjective: No new complaints.  Physical Exam: Vitals:   03/01/24 1638 03/01/24 1931 03/02/24 0513 03/02/24 0758  BP: 131/64 (!) 135/59 128/60 (!) 156/67  Pulse: 68 80 74 (!) 58  Resp: 17 18 18 18   Temp: 98.7 F (37.1 C) 99.6 F (37.6 C) 97.8 F (36.6 C) 97.7 F (36.5 C)  TempSrc: Oral  Oral Oral  SpO2: 99% 100%  100%  Weight:   87.5 kg   Height:      General exam: No acute distress Respiratory system: Clear to auscultation. Respiratory effort normal. Cardiovascular system: S1-S2, RRR, no murmurs, no pedal edema Gastrointestinal system: Soft, NT/ND, normal bowel sounds Central nervous system: Alert.  Oriented x 2.  No focal deficits Extremities: Symmetric 5 x 5 power.  Right foot tender to touch Skin: No rashes, lesions or ulcers Psychiatry: Judgement and insight appear normal. Mood & affect appropriate.   Data Reviewed: Labs reviewed.  Within normal limits There are no new results to review at this time.  Family Communication: None  Disposition: Status is: Inpatient Remains inpatient appropriate because:  Awaiting discharge to skilled nursing facility/memory care unit  Planned Discharge Destination: Skilled nursing facility    Time spent: 33 minutes  Author: Lucile Shutters, MD 03/02/2024 1:25 PM  For on call review [email or web address].

## 2024-03-02 NOTE — Care Management Important Message (Signed)
 Important Message  Patient Details  Name: [name] MRN: [id number] Date of Birth: Jun 08, 1951   Important Message Given:  Yes - Medicare IM     Cristela Blue, CMA 03/02/2024, 10:41 AM

## 2024-03-02 NOTE — Progress Notes (Signed)
 Mobility Specialist - Progress Note   03/02/24 1456  Mobility  Activity Ambulated with assistance in hallway  Level of Assistance Contact guard assist, steadying assist  Assistive Device Front wheel walker  Distance Ambulated (ft) 160 ft  Activity Response Tolerated well  Mobility visit 1 Mobility     Pt sitting in recliner  upon arrival, utilizing RA. Pt motivated for activity. STS with minG. Ambulation with CGA. VC to stay inside/close to RW. No LOB. Pt returned to recliner with alarm set, needs in reach.    Filiberto Pinks Mobility Specialist 03/02/24, 2:57 PM

## 2024-03-02 NOTE — Plan of Care (Signed)

## 2024-03-03 DIAGNOSIS — F03918 Unspecified dementia, unspecified severity, with other behavioral disturbance: Secondary | ICD-10-CM | POA: Diagnosis not present

## 2024-03-03 LAB — GLUCOSE, CAPILLARY: Glucose-Capillary: 116 mg/dL — ABNORMAL HIGH (ref 70–99)

## 2024-03-03 MED ORDER — HALOPERIDOL 0.5 MG PO TABS
1.0000 mg | ORAL_TABLET | Freq: Once | ORAL | Status: AC
  Administered 2024-03-03: 1 mg via ORAL
  Filled 2024-03-03: qty 2

## 2024-03-03 MED ORDER — HALOPERIDOL LACTATE 5 MG/ML IJ SOLN: 1.0000 mg | Freq: Once | INTRAMUSCULAR | Status: AC

## 2024-03-03 MED ORDER — AMLODIPINE BESYLATE 5 MG PO TABS
5.0000 mg | ORAL_TABLET | Freq: Every day | ORAL | Status: DC
  Administered 2024-03-03 – 2024-03-11 (×9): 5 mg via ORAL
  Filled 2024-03-03 (×9): qty 1

## 2024-03-03 NOTE — Progress Notes (Signed)
 Progress Note   Patient: [name] ZOX:[id number] DOB: [birth year] DOA: 02/19/2024     4 DOS: the patient was seen and examined on 03/03/2024   Brief hospital course:  73 y.o. male with medical history significant of cryptogenic stroke on loop recorder, CVA, dementia with behavioral disturbance, HTN, IIDM, DM neuropathy, BPH, who was found to have syncope episode x 3 during ED stay.   Patient initially brought to ED by family member for evaluation of worsening of agitation and mental declining on February 28.  Subsequently patient has been staying in the ED for the last 10+ days and unable to discharge back home due to increasing risk of mentation deterioration.     Last 2 days, ED physician witnessed at least 3 episodes of syncope.  Unfortunately patient was not on telemonitoring as time but ED staff reported that the patient suddenly became unresponsive symptoms lasted for about 10 seconds and patient recovers consciousness.  Patient does not remember any prodromes however due to ED record, patient did complain about lightheadedness before he lost consciousness.  No seizure activity reported.  ED workup showed hemoglobin 12.4, WBC 6.4, creatinine 1.3 compared to baseline 1.1-1.3, BUN 41   3/11: Patient's right foot pain has worsened in severity preventing effective therapy efforts   3/12: No new complaints  3/13: Right foot pain has resolved      Assessment and Plan:  Principal Problem:   Aggressive behavior due to dementia [facility]) Active Problems:   Syncope   Syncope Secondary to volume depletion Patient appeared to be volume contracted with dark-colored urine on admission, and increasing BUN/creatinine ratio, and some level of AKI.   Kidney function improved with hydration and is back to baseline.  Echocardiogram is normal Encourage p.o. fluid intake Fall precautions Appreciate PT input     AKI Suspect prerenal azotemia in the setting of Lasix, hydrochlorothiazide,  losartan use Renal ultrasound reassuring.  Kidney function improving.  Suggesting prerenal azotemia. Continue to hold Lasix, HCTZ, losartan.  Started on amlodipine for blood pressure control      Right foot pain Unknown etiology presumed acute gout flare.  Right foot x-ray imaging survey overall reassuring.  No fractures or dislocations.   Improved pain control on Prednisone 40 mg daily  Discontinue prednisone since patient has completed a 3-day course of therapy Uric acid level is within normal limits     HTN -Patient on amlodipine and uptitrate to optimize blood pressure control    IIDM -Continue metformin -Hemoglobin A1c 5.8.  Good control    Dementia with behavioral disturbance -Continue memantine and Aricept -as per psychiatry team   PPD read by RN and MD on 3/11.  Negative.  0 mm induration           Subjective: No new complaints.  Right foot pain has resolved  Physical Exam: Vitals:   03/02/24 2137 03/03/24 0628 03/03/24 0639 03/03/24 0742  BP: (!) 163/74  (!) 156/92 (!) 148/73  Pulse: 83  85 67  Resp: 20  16 16   Temp: 98.5 F (36.9 C)  98.4 F (36.9 C) 97.7 F (36.5 C)  TempSrc: Oral  Oral   SpO2: 100%  100% 100%  Weight:  86.3 kg    Height:       General exam: No acute distress Respiratory system: Clear to auscultation. Respiratory effort normal. Cardiovascular system: S1-S2, RRR, no murmurs, no pedal edema Gastrointestinal system: Soft, NT/ND, normal bowel sounds Central nervous system: Alert.  Oriented x 2.  No focal  deficits Extremities: Symmetric 5 x 5 power.  Right foot pain has resolved Skin: No rashes, lesions or ulcers Psychiatry: Judgement and insight appear normal. Mood & affect appropriate.       Data Reviewed: Labs reviewed.  Within normal limits There are no new results to review at this time.  Family Communication: None  Disposition: Status is: Inpatient Remains inpatient appropriate because: Awaiting discharge  Planned  Discharge Destination: Skilled nursing facility    Time spent: 32 minutes  Author: Lucile Shutters, MD 03/03/2024 12:48 PM  For on call review [email or web address].

## 2024-03-03 NOTE — Progress Notes (Addendum)
 Mobility Specialist - Progress Note   03/03/24 1100  Mobility  Activity Ambulated with assistance in hallway;Transferred from bed to chair  Level of Assistance Contact guard assist, steadying assist  Assistive Device Front wheel walker  Distance Ambulated (ft) 180 ft  Activity Response Tolerated well  Mobility visit 1 Mobility     Pt lying in bed upon arrival, utilizing RA. Pt agreeable to activity. AO to self. Voiced pain in R foot. Completed bed mobility with supervision. STS and ambulation in hallway with CGA. VC to stay close/inside RW. VC also for lateral stepping in tight spaces. Pt left in chair with alarm set, needs in reach.    [name] Mobility Specialist 03/03/24, 11:52 AM

## 2024-03-03 NOTE — Plan of Care (Signed)
  Problem: Cardiac: Goal: Will achieve and/or maintain adequate cardiac output Outcome: Progressing   Problem: Clinical Measurements: Goal: Ability to maintain clinical measurements within normal limits will improve Outcome: Progressing Goal: Will remain free from infection Outcome: Progressing Goal: Diagnostic test results will improve Outcome: Progressing Goal: Respiratory complications will improve Outcome: Progressing Goal: Cardiovascular complication will be avoided Outcome: Progressing   Problem: Activity: Goal: Risk for activity intolerance will decrease Outcome: Progressing   Problem: Nutrition: Goal: Adequate nutrition will be maintained Outcome: Progressing   Problem: Coping: Goal: Level of anxiety will decrease Outcome: Progressing   Problem: Elimination: Goal: Will not experience complications related to bowel motility Outcome: Progressing Goal: Will not experience complications related to urinary retention Outcome: Progressing   Problem: Pain Managment: Goal: General experience of comfort will improve and/or be controlled Outcome: Progressing   Problem: Safety: Goal: Ability to remain free from injury will improve Outcome: Progressing   Problem: Skin Integrity: Goal: Risk for impaired skin integrity will decrease Outcome: Progressing

## 2024-03-03 NOTE — TOC Progression Note (Signed)
 Transition of Care [facility]) - Progression Note    Patient Details  Name: [name] MRN: [id number] Date of Birth: May 09, 1951  Transition of Care [facility]) CM/SW Contact  Erin Sons, [place] Phone Number: 03/03/2024, 10:52 AM  Clinical Narrative:     Pt has no bed offers; TOC will continue to follow.   Expected Discharge Plan: Memory Care Barriers to Discharge: Continued Medical Work up  Expected Discharge Plan and Services     Post Acute Care Choice:  (Memory Care) Living arrangements for the past 2 months: Single Family Home                                       Social Determinants of Health (SDOH) Interventions SDOH Screenings   Food Insecurity: Patient Unable To Answer (02/27/2024)  Housing: Patient Unable To Answer (02/27/2024)  Transportation Needs: Patient Unable To Answer (02/27/2024)  Utilities: Patient Unable To Answer (02/27/2024)  Social Connections: Unknown (02/27/2024)  Tobacco Use: Low Risk  (02/28/2024)    Readmission Risk Interventions     No data to display

## 2024-03-04 ENCOUNTER — Ambulatory Visit: Payer: Medicare HMO | Admitting: Podiatry

## 2024-03-04 DIAGNOSIS — F03918 Unspecified dementia, unspecified severity, with other behavioral disturbance: Secondary | ICD-10-CM | POA: Diagnosis not present

## 2024-03-04 LAB — GLUCOSE, CAPILLARY: Glucose-Capillary: 106 mg/dL — ABNORMAL HIGH (ref 70–99)

## 2024-03-04 NOTE — Progress Notes (Signed)
 Mobility Specialist - Progress Note   03/04/24 1100  Mobility  Activity Ambulated with assistance in hallway;Ambulated with assistance to bathroom  Level of Assistance Standby assist, set-up cues, supervision of patient - no hands on  Assistive Device Front wheel walker  Distance Ambulated (ft) 180 ft  Activity Response Tolerated well  Mobility visit 1 Mobility     Pt sitting in recliner upon arrival, utilizing RA. Pt agreeable to activity. Impulsive; pt trying to stand with leg rest still up and pushes RW aside while ambulating to bathroom. Continued activity into hallway with CGA. Pt reporting pain in B feet with R > L---fixated on wanting his shoes. No LOB. Pt left in chair with alarm set, needs in reach.    Filiberto Pinks Mobility Specialist 03/04/24, 11:40 AM
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
## 2024-03-04 NOTE — Plan of Care (Signed)
 Patient non-comliant with bed alarm , easily redirectable.No acute distress at this time Problem: Education: Goal: Knowledge of condition and prescribed therapy will improve Outcome: Progressing   Problem: Cardiac: Goal: Will achieve and/or maintain adequate cardiac output Outcome: Progressing   Problem: Physical Regulation: Goal: Complications related to the disease process, condition or treatment will be avoided or minimized Outcome: Progressing   Problem: Education: Goal: Knowledge of General Education information will improve Description: Including pain rating scale, medication(s)/side effects and non-pharmacologic comfort measures Outcome: Progressing   Problem: Health Behavior/Discharge Planning: Goal: Ability to manage health-related needs will improve Outcome: Progressing   Problem: Clinical Measurements: Goal: Ability to maintain clinical measurements within normal limits will improve Outcome: Progressing Goal: Will remain free from infection Outcome: Progressing Goal: Diagnostic test results will improve Outcome: Progressing Goal: Respiratory complications will improve Outcome: Progressing Goal: Cardiovascular complication will be avoided Outcome: Progressing   Problem: Activity: Goal: Risk for activity intolerance will decrease Outcome: Progressing   Problem: Nutrition: Goal: Adequate nutrition will be maintained Outcome: Progressing   Problem: Coping: Goal: Level of anxiety will decrease Outcome: Progressing   Problem: Elimination: Goal: Will not experience complications related to bowel motility Outcome: Progressing Goal: Will not experience complications related to urinary retention Outcome: Progressing   Problem: Pain Managment: Goal: General experience of comfort will improve and/or be controlled Outcome: Progressing   Problem: Safety: Goal: Ability to remain free from injury will improve Outcome: Progressing   Problem: Skin  Integrity: Goal: Risk for impaired skin integrity will decrease Outcome: Progressing

## 2024-03-04 NOTE — Progress Notes (Signed)
 Progress Note   Patient: [name] NGE:[id number] DOB: 1951-09-19 DOA: 02/19/2024     5 DOS: the patient was seen and examined on 03/04/2024   Brief hospital course:  73 y.o. male with medical history significant of cryptogenic stroke on loop recorder, CVA, dementia with behavioral disturbance, HTN, IIDM, DM neuropathy, BPH, who was found to have syncope episode x 3 during ED stay.   Patient initially brought to ED by family member for evaluation of worsening of agitation and mental declining on February 28.  Subsequently patient has been staying in the ED for the last 10+ days and unable to discharge back home due to increasing risk of mentation deterioration.     Last 2 days, ED physician witnessed at least 3 episodes of syncope.  Unfortunately patient was not on telemonitoring as time but ED staff reported that the patient suddenly became unresponsive symptoms lasted for about 10 seconds and patient recovers consciousness.  Patient does not remember any prodromes however due to ED record, patient did complain about lightheadedness before he lost consciousness.  No seizure activity reported.  ED workup showed hemoglobin 12.4, WBC 6.4, creatinine 1.3 compared to baseline 1.1-1.3, BUN 41   3/11: Patient's right foot pain has worsened in severity preventing effective therapy efforts   3/12: No new complaints   3/13: Right foot pain presumed to be due to acute gout flare has resolved     Assessment and Plan:  Principal Problem:   Aggressive behavior due to dementia [facility]) Active Problems:   Syncope   Syncope Secondary to volume depletion Patient appeared to be volume contracted with dark-colored urine on admission, and increasing BUN/creatinine ratio, and some level of AKI.   Kidney function improved with hydration and is back to baseline.  Echocardiogram is normal Encourage p.o. fluid intake Fall precautions Appreciate PT input      AKI Suspect prerenal azotemia in the  setting of Lasix, hydrochlorothiazide, losartan use Renal ultrasound reassuring.  Kidney function improving.  Suggesting prerenal azotemia. Continue to hold Lasix, HCTZ, losartan.  Started on amlodipine for blood pressure control       Right foot pain Unknown etiology presumed acute gout flare.  Right foot x-ray imaging survey overall reassuring.  No fractures or dislocations.   Improved pain control on Prednisone 40 mg daily  Discontinue prednisone since patient has completed a 3-day course of therapy Uric acid level is within normal limits     HTN -Patient on amlodipine and uptitrate to optimize blood pressure control     IIDM -Continue metformin -Hemoglobin A1c 5.8.  Good control     Dementia with behavioral disturbance -Continue memantine and Aricept -as per psychiatry team   PPD read by RN and MD on 3/11.  Negative.  0 mm induration         Subjective: No new complaints. Ambulating in the hall with PT. Able to bear weight on his right foot.   Physical Exam: Vitals:   03/03/24 1958 03/04/24 0458 03/04/24 0649 03/04/24 0847  BP: (!) 153/67  139/60 138/65  Pulse: 87  72 70  Resp: 18  16 18   Temp: 98.6 F (37 C)  98.6 F (37 C) 98.7 F (37.1 C)  TempSrc: Oral     SpO2: 100%  99% 99%  Weight:  81.3 kg    Height:       General exam: No acute distress Respiratory system: Clear to auscultation. Respiratory effort normal. Cardiovascular system: S1-S2, RRR, no murmurs, no pedal edema Gastrointestinal  system: Soft, NT/ND, normal bowel sounds Central nervous system: Alert.  Oriented x 2.  No focal deficits Extremities: Symmetric 5 x 5 power.  Right foot pain has resolved Skin: No rashes, lesions or ulcers Psychiatry: Judgement and insight appear normal. Mood & affect appropriate.     Data Reviewed:  No new labs   Family Communication: None  Disposition: Status is: Inpatient Remains inpatient appropriate because: Awaiting discharge to skilled nursing  facility  Planned Discharge Destination: Skilled nursing facility    Time spent: 32 minutes  Author: Lucile Shutters, MD 03/04/2024 11:43 AM  For on call review [email or web address].

## 2024-03-04 NOTE — Progress Notes (Signed)
 Mobility Specialist - Progress Note   03/04/24 1500  Mobility  Activity Ambulated with assistance in room;Ambulated with assistance to bathroom;Transferred from bed to chair  Level of Assistance Standby assist, set-up cues, supervision of patient - no hands on  Assistive Device None  Distance Ambulated (ft) 12 ft  Activity Response Tolerated well  Mobility visit 1 Mobility     Pt in bathroom upon arrival, utilizing RA. Pt ambulated to bed with minG. Once supine, pt requested to go to chair so he could look out the window. Pt left in chair with alarm set, needs in reach.    [name] Mobility Specialist 03/04/24, 3:39 PM

## 2024-03-04 NOTE — Plan of Care (Signed)
 Pt's cognitive status doesn't allow for education retention.  Also, pt is impulsive when he needs to use the bathroom, and the bed alarm is triggering for pt.  When he exits the bed and the alarms sounds, pt becomes verbally aggressive.  After he goes to the bathroom, pt's attitude completely changes back to calm and cooperative.  Problem: Education: Goal: Knowledge of condition and prescribed therapy will improve Outcome: Not Progressing   Problem: Education: Goal: Knowledge of General Education information will improve Description: Including pain rating scale, medication(s)/side effects and non-pharmacologic comfort measures Outcome: Not Progressing   Problem: Health Behavior/Discharge Planning: Goal: Ability to manage health-related needs will improve Outcome: Not Progressing   Problem: Coping: Goal: Level of anxiety will decrease Outcome: Not Progressing   Problem: Safety: Goal: Ability to remain free from injury will improve Outcome: Not Progressing   __________________________________________________________________________   Problem: Cardiac: Goal: Will achieve and/or maintain adequate cardiac output Outcome: Progressing   Problem: Physical Regulation: Goal: Complications related to the disease process, condition or treatment will be avoided or minimized Outcome: Progressing   Problem: Clinical Measurements: Goal: Ability to maintain clinical measurements within normal limits will improve Outcome: Progressing Goal: Will remain free from infection Outcome: Progressing Goal: Diagnostic test results will improve Outcome: Progressing Goal: Respiratory complications will improve Outcome: Progressing Goal: Cardiovascular complication will be avoided Outcome: Progressing   Problem: Activity: Goal: Risk for activity intolerance will decrease Outcome: Progressing   Problem: Nutrition: Goal: Adequate nutrition will be maintained Outcome: Progressing   Problem:  Elimination: Goal: Will not experience complications related to bowel motility Outcome: Progressing Goal: Will not experience complications related to urinary retention Outcome: Progressing   Problem: Skin Integrity: Goal: Risk for impaired skin integrity will decrease Outcome: Progressing

## 2024-03-05 DIAGNOSIS — F03918 Unspecified dementia, unspecified severity, with other behavioral disturbance: Secondary | ICD-10-CM | POA: Diagnosis not present

## 2024-03-05 LAB — BASIC METABOLIC PANEL
Anion gap: 4 — ABNORMAL LOW (ref 5–15)
BUN: 27 mg/dL — ABNORMAL HIGH (ref 8–23)
CO2: 27 mmol/L (ref 22–32)
Calcium: 8.4 mg/dL — ABNORMAL LOW (ref 8.9–10.3)
Chloride: 107 mmol/L (ref 98–111)
Creatinine, Ser: 1.24 mg/dL (ref 0.61–1.24)
GFR, Estimated: >60 mL/min (ref >60)
Glucose, Bld: 120 mg/dL — ABNORMAL HIGH (ref 70–99)
Potassium: 3.9 mmol/L (ref 3.5–5.1)
Sodium: 138 mmol/L (ref 135–145)

## 2024-03-05 NOTE — Progress Notes (Signed)
 Mobility Specialist - Progress Note   03/05/24 1632  Mobility  Activity Ambulated with assistance in room;Ambulated with assistance to bathroom  Level of Assistance Standby assist, set-up cues, supervision of patient - no hands on  Assistive Device Front wheel walker  Distance Ambulated (ft) 20 ft  Activity Response Tolerated well  Mobility Referral Yes  Mobility visit 1 Mobility   Ms responds to chair alarm. Pt requesting to go to bathroom. Pt STS and ambulates to/from bathroom SBA. Pt returns to chair with needs in reach, chair alarm activated, and RN present.   Terrilyn Saver  Mobility Specialist  03/05/24 4:35 PM

## 2024-03-05 NOTE — Progress Notes (Signed)
 Mobility Specialist - Progress Note   03/05/24 0820  Mobility  Activity Ambulated with assistance in hallway;Stood at bedside;Dangled on edge of bed  Level of Assistance Standby assist, set-up cues, supervision of patient - no hands on  Assistive Device Front wheel walker  Distance Ambulated (ft) 200 ft  Activity Response Tolerated well  Mobility Referral Yes  Mobility visit 1 Mobility  Mobility Specialist Start Time (ACUTE ONLY) 0749  Mobility Specialist Stop Time (ACUTE ONLY) 0806  Mobility Specialist Time Calculation (min) (ACUTE ONLY) 17 min   Pt supine in bed on RA upon arrival. Pt completes bed mobility, STS, and ambulates in hallway SBA. Pt left in recliner with needs in reach and chair alarm activated.   Terrilyn Saver  Mobility Specialist  03/05/24 8:23 AM

## 2024-03-05 NOTE — Progress Notes (Signed)
 Progress Note   Patient: [name] ZOX:[id number] DOB: 1950/12/27 DOA: 02/19/2024     6 DOS: the patient was seen and examined on 03/05/2024   Brief hospital course:  73 y.o. male with medical history significant of cryptogenic stroke on loop recorder, CVA, dementia with behavioral disturbance, HTN, IIDM, DM neuropathy, BPH, who was found to have syncope episode x 3 during ED stay.   Patient initially brought to ED by family member for evaluation of worsening of agitation and mental declining on February 28.  Subsequently patient has been staying in the ED for the last 10+ days and unable to discharge back home due to increasing risk of mentation deterioration.     Last 2 days, ED physician witnessed at least 3 episodes of syncope.  Unfortunately patient was not on telemonitoring as time but ED staff reported that the patient suddenly became unresponsive symptoms lasted for about 10 seconds and patient recovers consciousness.  Patient does not remember any prodromes however due to ED record, patient did complain about lightheadedness before he lost consciousness.  No seizure activity reported.  ED workup showed hemoglobin 12.4, WBC 6.4, creatinine 1.3 compared to baseline 1.1-1.3, BUN 41   3/11: Patient's right foot pain has worsened in severity preventing effective therapy efforts   3/12: No new complaints   3/13: Right foot pain presumed to be due to acute gout flare has resolved     Assessment and Plan:  Principal Problem:   Aggressive behavior due to dementia [facility]) Active Problems:   Syncope   Syncope Secondary to volume depletion Patient appeared to be volume contracted with dark-colored urine on admission, and increasing BUN/creatinine ratio, and some level of AKI.   Kidney function improved with hydration and is back to baseline.  Echocardiogram is normal Encourage p.o. fluid intake Fall precautions Appreciate PT input       AKI Suspect prerenal azotemia in the  setting of Lasix, hydrochlorothiazide, losartan use Renal ultrasound reassuring.  Kidney function improving.  Suggesting prerenal azotemia. Continue to hold Lasix, HCTZ, losartan.   Continue amlodipine for blood pressure control       Right foot pain Acute gout flare  Unknown etiology presumed acute gout flare.  Right foot x-ray imaging survey overall reassuring.  No fractures or dislocations.   Improved pain control on Prednisone 40 mg daily  Discontinue prednisone since patient has completed a 3-day course of therapy Uric acid level is within normal limits     HTN Continue amlodipine     IIDM -Continue metformin -Hemoglobin A1c 5.8.  Good control     Dementia with behavioral disturbance -Presumed vascular -Continue memantine and Aricept -as per psychiatry team   PPD read by RN and MD on 3/11.  Negative.  0 mm induration      Subjective: No new complaints  Physical Exam: Vitals:   03/04/24 2008 03/05/24 0342 03/05/24 0500 03/05/24 0810  BP: (!) 145/65 (!) 142/75  (!) 140/71  Pulse: 80 71  70  Resp: 17 18  18   Temp: 99 F (37.2 C) 98.6 F (37 C)  97.7 F (36.5 C)  TempSrc: Oral     SpO2: 100% 100%  100%  Weight:   81.4 kg   Height:       General exam: No acute distress Respiratory system: Clear to auscultation. Respiratory effort normal. Cardiovascular system: S1-S2, RRR, no murmurs, no pedal edema Gastrointestinal system: Soft, NT/ND, normal bowel sounds Central nervous system: Alert.  Oriented x 2.  No focal  deficits Extremities: Symmetric 5 x 5 power.  Right foot pain has resolved Skin: No rashes, lesions or ulcers Psychiatry: Judgement and insight appear normal. Mood & affect appropriate.     Data Reviewed: Labs reviewed. There are no new results to review at this time.  Family Communication: None  Disposition: Status is: Inpatient Remains inpatient appropriate because: Awaiting discharge  Planned Discharge Destination: Skilled nursing  facility    Time spent: 30 minutes  Author: Lucile Shutters, MD 03/05/2024 12:28 PM  For on call review [email or web address].

## 2024-03-06 DIAGNOSIS — F03918 Unspecified dementia, unspecified severity, with other behavioral disturbance: Secondary | ICD-10-CM | POA: Diagnosis not present

## 2024-03-06 LAB — GLUCOSE, CAPILLARY: Glucose-Capillary: 108 mg/dL — ABNORMAL HIGH (ref 70–99)

## 2024-03-06 NOTE — Progress Notes (Signed)
 Mobility Specialist - Progress Note   03/06/24 0823  Mobility  Activity Ambulated with assistance in hallway;Stood at bedside;Dangled on edge of bed  Level of Assistance Standby assist, set-up cues, supervision of patient - no hands on  Assistive Device Front wheel walker  Distance Ambulated (ft) 160 ft  Activity Response Tolerated well  Mobility Referral Yes  Mobility visit 1 Mobility  Mobility Specialist Start Time (ACUTE ONLY) 0752  Mobility Specialist Stop Time (ACUTE ONLY) 0804  Mobility Specialist Time Calculation (min) (ACUTE ONLY) 12 min   Pt supine in bed on RA upon arrival. Pt STS and ambulates in hallway SBA with no LOB noted. Pt left in recliner with needs in reach and chair alarm activated.   Terrilyn Saver  Mobility Specialist  03/06/24 8:24 AM

## 2024-03-06 NOTE — Progress Notes (Signed)
 Progress Note   Patient: [name] ZOX:[id number] DOB: 1951-04-27 DOA: 02/19/2024     7 DOS: the patient was seen and examined on 03/06/2024   Brief hospital course:  73 y.o. male with medical history significant of cryptogenic stroke on loop recorder, CVA, dementia with behavioral disturbance, HTN, IIDM, DM neuropathy, BPH, who was found to have syncope episode x 3 during ED stay.   Patient initially brought to ED by family member for evaluation of worsening of agitation and mental declining on February 28.  Subsequently patient has been staying in the ED for the last 10+ days and unable to discharge back home due to increasing risk of mentation deterioration.     Last 2 days, ED physician witnessed at least 3 episodes of syncope.  Unfortunately patient was not on telemonitoring as time but ED staff reported that the patient suddenly became unresponsive symptoms lasted for about 10 seconds and patient recovers consciousness.  Patient does not remember any prodromes however due to ED record, patient did complain about lightheadedness before he lost consciousness.  No seizure activity reported.  ED workup showed hemoglobin 12.4, WBC 6.4, creatinine 1.3 compared to baseline 1.1-1.3, BUN 41   3/11: Patient's right foot pain has worsened in severity preventing effective therapy efforts   3/12: No new complaints   3/13: Right foot pain presumed to be due to acute gout flare has resolved     Assessment and Plan:   Principal Problem:   Aggressive behavior due to dementia [facility]) Active Problems:   Syncope   Syncope Secondary to volume depletion Patient appeared to be volume contracted with dark-colored urine on admission, and increasing BUN/creatinine ratio, and some level of AKI.   Kidney function improved with hydration and is back to baseline.  Echocardiogram is normal Encourage p.o. fluid intake Fall precautions Appreciate PT input       AKI Suspect prerenal azotemia in the  setting of Lasix, hydrochlorothiazide, losartan use Renal ultrasound reassuring.  Kidney function improving.  Suggesting prerenal azotemia. Continue to hold Lasix, HCTZ, losartan.   Continue amlodipine for blood pressure control       Right foot pain Acute gout flare  Unknown etiology presumed acute gout flare.  Right foot x-ray imaging survey overall reassuring.  No fractures or dislocations.   Improved pain control on Prednisone 40 mg daily  Discontinue prednisone since patient has completed a 3-day course of therapy Uric acid level is within normal limits     HTN Continue amlodipine     IIDM -Continue metformin -Hemoglobin A1c 5.8.  Good control     Dementia with behavioral disturbance -Presumed vascular -Continue memantine and Aricept -as per psychiatry team   PPD read by RN and MD on 3/11.  Negative.  0 mm induration          Subjective: Patient is sitting up in a recliner.  Had an uneventful night  Physical Exam: Vitals:   03/05/24 2059 03/06/24 0321 03/06/24 0444 03/06/24 0854  BP:   (!) 152/61 (!) 143/64  Pulse:   69 69  Resp:   19 19  Temp: 99.1 F (37.3 C)  99 F (37.2 C) 98.7 F (37.1 C)  TempSrc: Oral   Oral  SpO2:   100% 100%  Weight:  88.8 kg    Height:       General exam: No acute distress Respiratory system: Clear to auscultation. Respiratory effort normal. Cardiovascular system: S1-S2, RRR, no murmurs, no pedal edema Gastrointestinal system: Soft, NT/ND, normal  bowel sounds Central nervous system: Alert.  Oriented x 2.  No focal deficits Extremities: Symmetric 5 x 5 power.  Right foot pain has resolved Skin: No rashes, lesions or ulcers Psychiatry: Judgement and insight appear normal. Mood & affect appropriate.       Data Reviewed:  There are no new results to review at this time.  Family Communication: None  Disposition: Status is: Inpatient Remains inpatient appropriate because: Awaiting discharge to skilled nursing  facility  Planned Discharge Destination: Skilled nursing facility    Time spent: 30 minutes  Author: Lucile Shutters, MD 03/06/2024 11:29 AM  For on call review [email or web address].

## 2024-03-06 NOTE — Plan of Care (Signed)

## 2024-03-07 DIAGNOSIS — F03918 Unspecified dementia, unspecified severity, with other behavioral disturbance: Secondary | ICD-10-CM | POA: Diagnosis not present

## 2024-03-07 LAB — GLUCOSE, CAPILLARY
Glucose-Capillary: 120 mg/dL — ABNORMAL HIGH (ref 70–99)
Glucose-Capillary: 92 mg/dL (ref 70–99)

## 2024-03-07 NOTE — Progress Notes (Signed)
 Mobility Specialist - Progress Note   03/07/24 1400  Mobility  Activity Ambulated with assistance in hallway  Level of Assistance Standby assist, set-up cues, supervision of patient - no hands on  Assistive Device Front wheel walker  Distance Ambulated (ft) 160 ft  Activity Response Tolerated well  Mobility visit 1 Mobility     Pt sitting in recliner upon arrival, utilizing RA. Pt agreeable to activity. Pleasant. Ambulated in hallway with supervision, no LOB. Pt returned to recliner with alarm set, needs in reach.    [name] Mobility Specialist 03/07/24, 2:50 PM

## 2024-03-07 NOTE — Plan of Care (Signed)
  Problem: Education: Goal: Knowledge of condition and prescribed therapy will improve Outcome: Not Progressing   Problem: Physical Regulation: Goal: Complications related to the disease process, condition or treatment will be avoided or minimized Outcome: Progressing   Problem: Clinical Measurements: Goal: Will remain free from infection Outcome: Progressing   Problem: Activity: Goal: Risk for activity intolerance will decrease Outcome: Progressing   Problem: Nutrition: Goal: Adequate nutrition will be maintained Outcome: Progressing   Problem: Coping: Goal: Level of anxiety will decrease Outcome: Progressing

## 2024-03-07 NOTE — TOC Progression Note (Signed)
 Transition of Care [facility]) - Progression Note    Patient Details  Name: [name] MRN: [id number] Date of Birth: [birth year]  Transition of Care [facility]) CM/SW Contact  Chapman Fitch, RN Phone Number: 03/07/2024, 3:35 PM  Clinical Narrative:     No bed offers.   Patient ambulated 300 feet with therapy today  Called and spoke with Emelia Salisbury at [facility].  She states with the above level of ambulation patient can admit as planned on Wednesday, and been by their therapist there   TB results secure emailed to Crystal at [email or web address]  I have requested that the MD order a covid test tomorrow, and home health PT and OT for discharge.  Prior to patient being admitted the covid results, DC summary, Fl2, and home health orders will need to be sent to [place] at [facility].  I let Crystal know that typically the dc summary is completed on day of discharge, and then the discharge medications are copied and pasted into the Fl2 for the provider to be signed.  I let her know that at the earliest the MD may be willing to complete tomorrow afternoon, but that it may have to be Wednesday Morning  Daughter [place] update.  She states that family will be available to transport   Expected Discharge Plan: Memory Care Barriers to Discharge: Continued Medical Work up  Expected Discharge Plan and Services     Post Acute Care Choice:  (Memory Care) Living arrangements for the past 2 months: Single Family Home                                       Social Determinants of Health (SDOH) Interventions SDOH Screenings   Food Insecurity: Patient Unable To Answer (02/27/2024)  Housing: Patient Unable To Answer (02/27/2024)  Transportation Needs: Patient Unable To Answer (02/27/2024)  Utilities: Patient Unable To Answer (02/27/2024)  Social Connections: Unknown (02/27/2024)  Tobacco Use: Low Risk  (02/28/2024)    Readmission Risk Interventions     No data to display

## 2024-03-07 NOTE — Progress Notes (Signed)
 Physical Therapy Treatment Patient Details Name: [name] MRN: [id number] DOB: [birth year] Today's Date: 03/07/2024   History of Present Illness [name] is a 73 year-old male with a known diagnosis of dementia who is presenting with worsening behavioral symptoms, including verbal threats and physical aggression toward family members.  Since presentation to ED, has had 3 reported episodes of syncope, now with significant pain to R foot (imaging negative); unable to WB or mobilize as result.    PT Comments  Patient supine in bed upon arrival, pleasantly agreeable to PT session. Patient denies pain throughout session, but did favor R foot intermittent. Patient continues to remain pleasant, but only oriented to self. Patient supervision for bed mobility for safety, no physical assist required. Able to complete STS from EOB with CGA to RW. Trialed mass practice of STS with minimal carryover for hand placement throughout session. Patient able to ambulate ~ 300 ft with RW, continue to demo poor safety awareness and requiring cues for obstacle negotiation in hallway. Patient will continue to benefit from skilled acute PT services to address functional impairments (see below for additional) and maximize functional mobility. Discharge recommendation remains appropriate. Will continue to follow acutely     If plan is discharge home, recommend the following: Assistance with cooking/housework;Direct supervision/assist for medications management;Direct supervision/assist for financial management;Assist for transportation;Help with stairs or ramp for entrance;Supervision due to cognitive status;A lot of help with bathing/dressing/bathroom;A little help with walking and/or transfers   Can travel by private vehicle     Yes  Equipment Recommendations  Rolling walker (2 wheels);BSC/3in1    Recommendations for Other Services       Precautions / Restrictions Precautions Precautions: Fall Recall  of Precautions/Restrictions: Impaired Restrictions Weight Bearing Restrictions Per Provider Order: No     Mobility  Bed Mobility Overal bed mobility: Needs Assistance Bed Mobility: Supine to Sit     Supine to sit: Supervision     General bed mobility comments: no physical assist, supervision for safety. VC's for sequencing.    Transfers Overall transfer level: Needs assistance Equipment used: Rolling walker (2 wheels) Transfers: Sit to/from Stand Sit to Stand: Contact guard assist           General transfer comment: CGA to stand from EOB, VC's for hand placement required.    Ambulation/Gait Ambulation/Gait assistance: Contact guard assist, Supervision Gait Distance (Feet): 300 Feet Assistive device: Rolling walker (2 wheels) Gait Pattern/deviations: Step-through pattern, Trunk flexed Gait velocity: Decreased     General Gait Details: Pt able to ambulate ~ 300 ft with RW, mild antaglic gait due to R foot pain but tolerated mobility well.   Stairs             Wheelchair Mobility     Tilt Bed    Modified Rankin (Stroke Patients Only)       Balance Overall balance assessment: Needs assistance Sitting-balance support: No upper extremity supported, Feet supported Sitting balance-Leahy Scale: Good     Standing balance support: Bilateral upper extremity supported, During functional activity, Reliant on assistive device for balance Standing balance-Leahy Scale: Good Standing balance comment: Continue to remain at high risk for falls. Overall good balance in standing                            Communication Communication Communication: No apparent difficulties  Cognition Arousal: Alert Behavior During Therapy: Flat affect, WFL for tasks assessed/performed   PT - Cognitive impairments: History of  cognitive impairments                       PT - Cognition Comments: Pt is pleasant and alert; only oriented to self. Able to follow  single step commands throughout session. Following commands: Impaired      Cueing Cueing Techniques: Verbal cues  Exercises Other Exercises Other Exercises: Mass practice STS from recliner to RW x 8 reps, working on hand placement. Mild fatigue.    General Comments        Pertinent Vitals/Pain Pain Assessment Pain Assessment: No/denies pain (denies pain, but favors RLE intermittent during mobility)    Home Living                          Prior Function            PT Goals (current goals can now be found in the care plan section) Acute Rehab PT Goals PT Goal Formulation: With patient Time For Goal Achievement: 03/14/24 Potential to Achieve Goals: Good Progress towards PT goals: Progressing toward goals    Frequency    Min 2X/week      PT Plan      Co-evaluation              AM-PAC PT "6 Clicks" Mobility   Outcome Measure  Help needed turning from your back to your side while in a flat bed without using bedrails?: None Help needed moving from lying on your back to sitting on the side of a flat bed without using bedrails?: A Little Help needed moving to and from a bed to a chair (including a wheelchair)?: A Little Help needed standing up from a chair using your arms (e.g., wheelchair or bedside chair)?: A Little Help needed to walk in hospital room?: A Little Help needed climbing 3-5 steps with a railing? : A Little 6 Click Score: 19    End of Session Equipment Utilized During Treatment: Gait belt Activity Tolerance: Patient tolerated treatment well Patient left: in chair;with call bell/phone within reach;with chair alarm set Nurse Communication: Mobility status PT Visit Diagnosis: Muscle weakness (generalized) (M62.81);Pain Pain - Right/Left: Right Pain - part of body: Ankle and joints of foot     Time: 1057-1110 PT Time Calculation (min) (ACUTE ONLY): 13 min  Charges:    $Gait Training: 8-22 mins PT General Charges $$ ACUTE PT  VISIT: 1 Visit                     Creed Copper Fairly, PT, DPT 03/07/24 11:25 AM

## 2024-03-07 NOTE — Progress Notes (Signed)
 Per Dr Joylene Igo, dc airborne/contact precautions

## 2024-03-07 NOTE — Progress Notes (Signed)
 Progress Note   Patient: [name] VOZ:[id number] DOB: 03-24-1951 DOA: 02/19/2024     8 DOS: the patient was seen and examined on 03/07/2024   Brief hospital course: 73 y.o. male with medical history significant of cryptogenic stroke on loop recorder, CVA, dementia with behavioral disturbance, HTN, IIDM, DM neuropathy, BPH, who was found to have syncope episode x 3 during ED stay.   Patient initially brought to ED by family member for evaluation of worsening of agitation and mental declining on February 28.  Subsequently patient has been staying in the ED for the last 10+ days and unable to discharge back home due to increasing risk of mentation deterioration.     Last 2 days, ED physician witnessed at least 3 episodes of syncope.  Unfortunately patient was not on telemonitoring as time but ED staff reported that the patient suddenly became unresponsive symptoms lasted for about 10 seconds and patient recovers consciousness.  Patient does not remember any prodromes however due to ED record, patient did complain about lightheadedness before he lost consciousness.  No seizure activity reported.  ED workup showed hemoglobin 12.4, WBC 6.4, creatinine 1.3 compared to baseline 1.1-1.3, BUN 41   3/11: Patient's right foot pain has worsened in severity preventing effective therapy efforts   3/12: No new complaints   3/13: Right foot pain presumed to be due to acute gout flare has resolved      Assessment and Plan:  Principal Problem:   Aggressive behavior due to dementia [facility]) Active Problems:   Syncope   Syncope Secondary to volume depletion Patient appeared to be volume contracted with dark-colored urine on admission, and increasing BUN/creatinine ratio, and some level of AKI.   Kidney function improved with hydration and is back to baseline.  Echocardiogram is normal Encourage p.o. fluid intake Fall precautions Appreciate PT input       AKI Suspect prerenal azotemia in the  setting of Lasix, hydrochlorothiazide, losartan use Renal ultrasound reassuring.  Kidney function improving.  Suggesting prerenal azotemia. Continue to hold Lasix, HCTZ, losartan.   Continue amlodipine for blood pressure control       Right foot pain Acute gout flare  Unknown etiology presumed acute gout flare.  Right foot x-ray imaging survey overall reassuring.  No fractures or dislocations.   Improved pain control on Prednisone 40 mg daily  Discontinue prednisone since patient has completed a 3-day course of therapy Uric acid level is within normal limits     HTN Continue amlodipine     IIDM -Continue metformin -Hemoglobin A1c 5.8.  Good control     Dementia with behavioral disturbance -Presumed vascular -Continue memantine and Aricept -as per psychiatry team -- Patient will be discharged to a memory care unit on 03/19   PPD read by RN and MD on 3/11.  Negative.  0 mm induration              Subjective: No new complaints  Physical Exam: Vitals:   03/06/24 2100 03/07/24 0418 03/07/24 0518 03/07/24 0750  BP: 123/66 (!) 117/56  (!) 132/56  Pulse: 78 79  68  Resp: 18 18  19   Temp: (!) 97.4 F (36.3 C) 97.6 F (36.4 C)  98.6 F (37 C)  TempSrc: Oral Oral    SpO2:  97%  98%  Weight:   87.8 kg   Height:       General exam: No acute distress Respiratory system: Clear to auscultation. Respiratory effort normal. Cardiovascular system: S1-S2, RRR, no murmurs, no  pedal edema Gastrointestinal system: Soft, NT/ND, normal bowel sounds Central nervous system: Alert.  Oriented x 2.  No focal deficits Extremities: Symmetric 5 x 5 power.  Right foot pain has resolved Skin: No rashes, lesions or ulcers Psychiatry: Judgement and insight appear normal. Mood & affect appropriate.    Data Reviewed:  There are no new results to review at this time.  Family Communication: None  Disposition: Status is: Inpatient Remains inpatient appropriate because:   Planned  Discharge Destination:  Memory care unit    Time spent: 30 minutes  Author: Lucile Shutters, MD 03/07/2024 12:56 PM  For on call review [email or web address].

## 2024-03-08 ENCOUNTER — Encounter: Payer: Self-pay | Admitting: Internal Medicine

## 2024-03-08 DIAGNOSIS — F03918 Unspecified dementia, unspecified severity, with other behavioral disturbance: Secondary | ICD-10-CM | POA: Diagnosis not present

## 2024-03-08 LAB — BASIC METABOLIC PANEL
Anion gap: 5 (ref 5–15)
BUN: 23 mg/dL (ref 8–23)
CO2: 29 mmol/L (ref 22–32)
Calcium: 8.9 mg/dL (ref 8.9–10.3)
Chloride: 105 mmol/L (ref 98–111)
Creatinine, Ser: 1.19 mg/dL (ref 0.61–1.24)
GFR, Estimated: >60 mL/min (ref >60)
Glucose, Bld: 157 mg/dL — ABNORMAL HIGH (ref 70–99)
Potassium: 4.6 mmol/L (ref 3.5–5.1)
Sodium: 139 mmol/L (ref 135–145)

## 2024-03-08 LAB — SARS CORONAVIRUS 2 BY RT PCR: SARS Coronavirus 2 by RT PCR: NEGATIVE

## 2024-03-08 LAB — GLUCOSE, CAPILLARY: Glucose-Capillary: 88 mg/dL (ref 70–99)

## 2024-03-08 MED ORDER — QUETIAPINE FUMARATE 100 MG PO TABS: 100.0000 mg | ORAL_TABLET | Freq: Every day | ORAL | 0 refills | Status: DC

## 2024-03-08 MED ORDER — AMLODIPINE BESYLATE 5 MG PO TABS: 5.0000 mg | ORAL_TABLET | Freq: Every day | ORAL | 0 refills | Status: DC

## 2024-03-08 NOTE — [place] FL2 (Signed)
 [place] MEDICAID FL2 LEVEL OF CARE FORM     IDENTIFICATION  Patient Name: [name] Birthdate: 07/16/1951 Sex: male Admission Date (Current Location): 02/19/2024  [facility] and [place] Number:  Producer, television/film/video and Address:  [facility], [address] [place], [place] [address]      Provider Number: [id number]  Attending Physician Name and Address:  Lucile Shutters, MD  Relative Name and Phone Number:       Current Level of Care: Hospital Recommended Level of Care: Assisted Living Facility [facility] MEMORY Care) Prior Approval Number:    Date Approved/Denied:   PASRR Number: [id number]  Discharge Plan: Other (Comment) (alf)    Current Diagnoses: Patient Active Problem List   Diagnosis Date Noted   Syncope 02/27/2024   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) 02/19/2024   Memory change 11/20/2020   Acute cerebrovascular accident (CVA) (HCC) 01/20/2020   Benign essential HTN 01/20/2020   Left homonymous hemianopsia 12/23/2019    Orientation RESPIRATION BLADDER Height & Weight     Self, Place  Normal Continent Weight: 82.5 kg Height:  5\' 7"  (170.2 cm)  BEHAVIORAL SYMPTOMS/MOOD NEUROLOGICAL BOWEL NUTRITION STATUS   (Cooperative, calm.)   Continent Diet (see d/c summary)  AMBULATORY STATUS COMMUNICATION OF NEEDS Skin   Extensive Assist Verbally Other (Comment) (skin tear left leg)                       Personal Care Assistance Level of Assistance  Bathing, Feeding, Dressing Bathing Assistance: Maximum assistance Feeding assistance: Independent Dressing Assistance: Limited assistance     Functional Limitations Info  Sight, Hearing, Speech Sight Info: Adequate Hearing Info: Adequate Speech Info: Adequate    SPECIAL CARE FACTORS FREQUENCY  PT (By licensed PT), OT (By licensed OT)     PT Frequency: 5x/week OT Frequency: 5x/week            Contractures Contractures Info: Not present     Additional Factors Info  Code Status, Allergies Code Status Info: full code Allergies Info: shellfish allergy           Current Medications (03/08/2024):  This is the current hospital active medication list Current Facility-Administered Medications  Medication Dose Route Frequency Provider Last Rate Last Admin   amLODipine (NORVASC) tablet 5 mg  5 mg Oral Daily Agbata, Tochukwu, MD   5 mg at 03/08/24 0817   atorvastatin (LIPITOR) tablet 40 mg  40 mg Oral q1800 Merwyn Katos, MD   40 mg at 03/07/24 1700   diclofenac Sodium (VOLTAREN) 1 % topical gel 4 g  4 g Topical QID Lolita Patella B, MD   4 g at 03/08/24 0818   donepezil (ARICEPT) tablet 10 mg  10 mg Oral QHS Merwyn Katos, MD   10 mg at 03/07/24 2304   enoxaparin (LOVENOX) injection 40 mg  40 mg Subcutaneous Q24H Mikey College T, MD   40 mg at 03/07/24 2258   hydrALAZINE (APRESOLINE) injection 5 mg  5 mg Intravenous Q6H PRN Mikey College T, MD       latanoprost (XALATAN) 0.005 % ophthalmic solution 1 drop  1 drop Both Eyes QHS Merwyn Katos, MD   1 drop at 03/07/24 2304   memantine (NAMENDA) tablet 10 mg  10 mg Oral BID Merwyn Katos, MD   10 mg at 03/08/24 0817   metFORMIN (GLUCOPHAGE) tablet 500 mg  500 mg Oral BID WC Merwyn Katos, MD  500 mg at 03/08/24 5643   methocarbamol (ROBAXIN) tablet 500 mg  500 mg Oral TID Lolita Patella B, MD   500 mg at 03/08/24 3295   multivitamin with minerals tablet 1 tablet  1 tablet Oral Daily Merwyn Katos, MD   1 tablet at 03/08/24 0816   OLANZapine (ZYPREXA) injection 2.5 mg  2.5 mg Intramuscular Q6H PRN Mikey College T, MD   2.5 mg at 02/28/24 1028   polyvinyl alcohol (LIQUIFILM TEARS) 1.4 % ophthalmic solution 1 drop  1 drop Both Eyes q AM Merwyn Katos, MD   1 drop at 03/08/24 0639   potassium chloride SA (KLOR-CON M) CR tablet 20 mEq  20 mEq Oral Daily Merwyn Katos, MD   20 mEq at 03/08/24 0817   QUEtiapine (SEROQUEL) tablet 100 mg  100 mg Oral QHS Juliann Pares,  NP   100 mg at 03/07/24 2302   sodium chloride flush (NS) 0.9 % injection 3 mL  3 mL Intravenous Q12H Mikey College T, MD   3 mL at 03/07/24 0818   tamsulosin (FLOMAX) capsule 0.4 mg  0.4 mg Oral Daily Merwyn Katos, MD   0.4 mg at 03/08/24 0817   traZODone (DESYREL) tablet 150 mg  150 mg Oral QHS Merwyn Katos, MD   150 mg at 03/07/24 2304   ziprasidone (GEODON) injection 10 mg  10 mg Intramuscular Q6H PRN Lolita Patella B, MD   10 mg at 02/28/24 1342     Discharge Medications: Please see discharge summary for a list of discharge medications.  ASK your doctor about these medications   Medication Details Next Dose Due Morning Afternoon Evening Bedtime As Needed   aspirin EC 325 MG tablet aspirin 325 mg tablet,delayed release          atorvastatin 40 MG tablet Commonly known as: LIPITOR Take 1 tablet (40 mg total) by mouth daily at 6 PM.          carvedilol 6.25 MG tablet Commonly known as: COREG Take 6.25 mg by mouth 2 (two) times daily with a meal.          donepezil 10 MG tablet Commonly known as: ARICEPT Take 1 tablet (10 mg total) by mouth at bedtime.          furosemide 40 MG tablet Commonly known as: LASIX Start taking on: May 01, 2023 Take 1 tablet (40 mg total) by mouth 2 (two) times daily for 4 days, THEN 1 tablet (40 mg total) daily.          hydrochlorothiazide 25 MG tablet Commonly known as: HYDRODIURIL Take 25 mg by mouth daily.          latanoprost 0.005 % ophthalmic solution Commonly known as: XALATAN Place 1 drop into both eyes at bedtime.          losartan 50 MG tablet Commonly known as: COZAAR Take 50 mg by mouth 2 (two) times daily. Ask about: Which instructions should I use?          memantine 10 MG tablet Commonly known as: NAMENDA Take 1 tablet (10 mg total) by mouth 2 (two) times daily.          metFORMIN 500 MG tablet Commonly known as: GLUCOPHAGE Take by mouth 2 (two) times daily with a meal.          ONE-A-DAY MENS 50+ PO Take 1 tablet by  mouth daily.          potassium  chloride SA 20 MEQ tablet Commonly known as: KLOR-CON M Take 20 mEq by mouth daily.          QUEtiapine 25 MG tablet Commonly known as: SEROQUEL Take 25 mg by mouth at bedtime.          REFRESH OP Place 1 drop into both eyes in the morning and at bedtime.          tamsulosin 0.4 MG Caps capsule Commonly known as: FLOMAX Take 0.4 mg by mouth daily.          traZODone 150 MG tablet Commonly known as: DESYREL Take 1 tablet (150 mg total) by mouth at bedtime.           Relevant Imaging Results:  Relevant Lab Results:   Additional Information SS#: [id number]  Cherre Blanc, RN

## 2024-03-08 NOTE — Progress Notes (Signed)
 Carelink Summary Report / Loop Recorder

## 2024-03-08 NOTE — Addendum Note (Signed)
 Addended by: Elease Etienne A on: 03/08/2024 09:36 AM   Modules accepted: Orders

## 2024-03-08 NOTE — Plan of Care (Signed)
  Problem: Education: Goal: Knowledge of condition and prescribed therapy will improve Outcome: Progressing   Problem: Cardiac: Goal: Will achieve and/or maintain adequate cardiac output Outcome: Progressing   Problem: Physical Regulation: Goal: Complications related to the disease process, condition or treatment will be avoided or minimized Outcome: Progressing   Problem: Coping: Goal: Level of anxiety will decrease Outcome: Progressing

## 2024-03-08 NOTE — Progress Notes (Signed)
 Progress Note   Patient: [name] ZOX:[id number] DOB: June 18, 1951 DOA: 02/19/2024     9 DOS: the patient was seen and examined on 03/08/2024   Brief hospital course:  73 y.o. male with medical history significant of cryptogenic stroke on loop recorder, CVA, dementia with behavioral disturbance, HTN, IIDM, DM neuropathy, BPH, who was found to have syncope episode x 3 during ED stay.   Patient initially brought to ED by family member for evaluation of worsening of agitation and mental declining on February 28.  Subsequently patient has been staying in the ED for the last 10+ days and unable to discharge back home due to increasing risk of mentation deterioration.     Last 2 days, ED physician witnessed at least 3 episodes of syncope.  Unfortunately patient was not on telemonitoring as time but ED staff reported that the patient suddenly became unresponsive symptoms lasted for about 10 seconds and patient recovers consciousness.  Patient does not remember any prodromes however due to ED record, patient did complain about lightheadedness before he lost consciousness.  No seizure activity reported.  ED workup showed hemoglobin 12.4, WBC 6.4, creatinine 1.3 compared to baseline 1.1-1.3, BUN 41   3/11: Patient's right foot pain has worsened in severity preventing effective therapy efforts   3/12: No new complaints   3/13: Right foot pain presumed to be due to acute gout flare has resolved  3/17: Stable and ready for discharge in a.m. to a memory care unit      Assessment and Plan:  Principal Problem:   Aggressive behavior due to dementia [facility]) Active Problems:   Syncope   Syncope Secondary to volume depletion Patient appeared to be volume contracted on admission with dark-colored urine  and increasing BUN/creatinine ratio, and some level of AKI.   Kidney function improved with hydration and is back to baseline.  Echocardiogram is normal Encourage p.o. fluid intake Fall  precautions Appreciate PT input       AKI Suspect prerenal azotemia in the setting of Lasix, hydrochlorothiazide, losartan use Renal ultrasound reassuring.  Kidney function improved.  Suggesting prerenal azotemia. Continue to hold Lasix, HCTZ, losartan.   Continue amlodipine for blood pressure control       Right foot pain Acute gout flare  Unknown etiology presumed acute gout flare.  Right foot x-ray imaging survey overall reassuring.  No fractures or dislocations.   Improved pain control on Prednisone 40 mg daily  Uric acid level is within normal limits     HTN Continue amlodipine     IIDM -Continue metformin -Hemoglobin A1c 5.8.  Good control     Dementia with behavioral disturbance -Presumed vascular -Continue memantine and Aricept -as per psychiatry team -- Patient will be discharged to a memory care unit on 03/19   PPD read by RN and MD on 3/11.  Negative.  0 mm induration COVID test is negative          Subjective: Patient is seen and examined at the bedside.  Sleeping but arouses easily.  Physical Exam: Vitals:   03/08/24 0014 03/08/24 0327 03/08/24 0406 03/08/24 0816  BP: 137/64 (!) 144/55  125/60  Pulse: 79 68  64  Resp: 17 20  16   Temp: 98.4 F (36.9 C) 98 F (36.7 C)  97.7 F (36.5 C)  TempSrc:    Oral  SpO2: 98% 100%  98%  Weight:   82.5 kg   Height:       General exam: No acute distress Respiratory system:  Clear to auscultation. Respiratory effort normal. Cardiovascular system: S1-S2, RRR, no murmurs, no pedal edema Gastrointestinal system: Soft, NT/ND, normal bowel sounds Central nervous system: Alert.  Oriented x 2.  No focal deficits Extremities: Symmetric 5 x 5 power.  Right foot pain has resolved Skin: No rashes, lesions or ulcers Psychiatry: Judgement and insight appear normal. Mood & affect appropriate.    Data Reviewed:  There are no new results to review at this time.  Family Communication: Attempted to call patient's  daughter.  Left a voicemail.  Awaiting callback.  Disposition: Status is: Inpatient Remains inpatient appropriate because: For discharge in a.m. to memory care unit  Planned Discharge Destination:  Memory care unit    Time spent: 30 minutes  Author: Lucile Shutters, MD 03/08/2024 1:09 PM  For on call review [email or web address].

## 2024-03-09 DIAGNOSIS — F03918 Unspecified dementia, unspecified severity, with other behavioral disturbance: Secondary | ICD-10-CM | POA: Diagnosis not present

## 2024-03-09 LAB — GLUCOSE, CAPILLARY: Glucose-Capillary: 95 mg/dL (ref 70–99)

## 2024-03-09 MED ORDER — ASPIRIN 81 MG PO TBEC: 81.0000 mg | DELAYED_RELEASE_TABLET | Freq: Every day | ORAL | Status: DC

## 2024-03-09 NOTE — Progress Notes (Signed)
 Physical Therapy Treatment Patient Details Name: [name] MRN: [id number] DOB: 1951/09/08 Today's Date: 03/09/2024   History of Present Illness [name] is a 73 year-old male with a known diagnosis of dementia who is presenting with worsening behavioral symptoms, including verbal threats and physical aggression toward family members.  Since presentation to ED, has had 3 reported episodes of syncope, now with significant pain to R foot (imaging negative); unable to WB or mobilize as result.    PT Comments  Performing all mobility with RW, close sup; calm and cooperative throughout session.  Requires near constant verbal cuing for way-finding and general task instruction/direction throughout session; minimal/absent recall of verbal direction and cuing. Continues to demonstrate generalized soreness to R foot, tender to palpation/compression over R forefoot. Self-modifying gait mechanics to limit mobility/WBing to R forefoot; brief gait trial with R post-op shoe unsuccessful (with post-op shoe likely to be greater hazard than help).  Do continue to recommend use of RW to help offload as needed; will trial tennis shoes/personal shoes as available.  Patient likely nearing baseline/functional plateau in mobility performance; will continue to assess and plan discharge from PT services over next 1-2 sessions.    If plan is discharge home, recommend the following: Assistance with cooking/housework;Direct supervision/assist for medications management;Direct supervision/assist for financial management;Assist for transportation;Help with stairs or ramp for entrance;Supervision due to cognitive status;A lot of help with bathing/dressing/bathroom;A little help with walking and/or transfers   Can travel by private vehicle     Yes  Equipment Recommendations       Recommendations for Other Services       Precautions / Restrictions Precautions Precautions: Fall Restrictions Weight Bearing  Restrictions Per Provider Order: No     Mobility  Bed Mobility               General bed mobility comments: seated in recliner beginning/end of treatment session    Transfers Overall transfer level: Needs assistance Equipment used: Rolling walker (2 wheels) Transfers: Sit to/from Stand Sit to Stand: Supervision                Ambulation/Gait Ambulation/Gait assistance: Supervision Gait Distance (Feet): 400 Feet Assistive device: Rolling walker (2 wheels)   Gait velocity: 10' walk time, 9-10 seconds     General Gait Details: partially reciprocal gait pattern, mildly antalgic with decreased stance time/WBing R LE (due to pain); maintains R LE in excessive ER throughout gait cycle with limited movement of forefoot, limited toe off as result.  Constant verbal cuing/direction for way-finding; absent recall of directions from one turn to the next   Stairs             Wheelchair Mobility     Tilt Bed    Modified Rankin (Stroke Patients Only)       Balance Overall balance assessment: Needs assistance Sitting-balance support: No upper extremity supported, Feet supported Sitting balance-Leahy Scale: Good     Standing balance support: Bilateral upper extremity supported Standing balance-Leahy Scale: Fair                              Communication    Cognition Arousal: Alert     PT - Cognitive impairments: History of cognitive impairments                       PT - Cognition Comments: Pt is pleasant and alert; only oriented to self. Able to follow  single step commands throughout session.        Cueing    Exercises Other Exercises Other Exercises: 74' with RW and R post-op shoe, sup--no notable benefit or change in gait mechanics with use of post-op shoe, likely to be more hazard than help    General Comments        Pertinent Vitals/Pain Pain Assessment Pain Assessment: Faces Faces Pain Scale: Hurts little more Pain  Location: R foot Pain Descriptors / Indicators: Aching, Grimacing Pain Intervention(s): Limited activity within patient's tolerance, Monitored during session, Repositioned    Home Living                          Prior Function            PT Goals (current goals can now be found in the care plan section) Acute Rehab PT Goals Patient Stated Goal: none stated PT Goal Formulation: With patient Time For Goal Achievement: 03/14/24 Potential to Achieve Goals: Good Progress towards PT goals: Progressing toward goals    Frequency    Min 2X/week      PT Plan      Co-evaluation              AM-PAC PT "6 Clicks" Mobility   Outcome Measure  Help needed turning from your back to your side while in a flat bed without using bedrails?: None Help needed moving from lying on your back to sitting on the side of a flat bed without using bedrails?: None Help needed moving to and from a bed to a chair (including a wheelchair)?: None Help needed standing up from a chair using your arms (e.g., wheelchair or bedside chair)?: None Help needed to walk in hospital room?: None Help needed climbing 3-5 steps with a railing? : A Little 6 Click Score: 23    End of Session   Activity Tolerance: Patient tolerated treatment well Patient left: in chair;with call bell/phone within reach;with chair alarm set Nurse Communication: Mobility status PT Visit Diagnosis: Muscle weakness (generalized) (M62.81);Pain Pain - Right/Left: Right Pain - part of body: Ankle and joints of foot     Time: 4401-0272 PT Time Calculation (min) (ACUTE ONLY): 20 min  Charges:    $Gait Training: 8-22 mins PT General Charges $$ ACUTE PT VISIT: 1 Visit                    Baltasar Twilley H. Manson Passey, PT, DPT, NCS 03/09/24, 5:08 PM [phone]

## 2024-03-09 NOTE — Plan of Care (Signed)
  Problem: Education: Goal: Knowledge of condition and prescribed therapy will improve Outcome: Progressing   Problem: Cardiac: Goal: Will achieve and/or maintain adequate cardiac output Outcome: Progressing   Problem: Physical Regulation: Goal: Complications related to the disease process, condition or treatment will be avoided or minimized Outcome: Progressing   Problem: Health Behavior/Discharge Planning: Goal: Ability to manage health-related needs will improve Outcome: Progressing   Problem: Clinical Measurements: Goal: Will remain free from infection Outcome: Progressing   Problem: Nutrition: Goal: Adequate nutrition will be maintained Outcome: Progressing   Problem: Pain Managment: Goal: General experience of comfort will improve and/or be controlled Outcome: Progressing   Problem: Skin Integrity: Goal: Risk for impaired skin integrity will decrease Outcome: Progressing

## 2024-03-09 NOTE — TOC Progression Note (Signed)
 Transition of Care [facility]) - Progression Note    Patient Details  Name: [name] MRN: [id number] Date of Birth: 1951-07-01  Transition of Care [facility]) CM/SW Contact  Cherre Blanc, RN Phone Number: 03/09/2024, 9:02 AM  Clinical Narrative:     TOC sent H&P, signed FL2, DC Summary, and Covid Test results to Crystal at [facility]. TOC will arrange for transportation to the facility.    Expected Discharge Plan: Memory Care Barriers to Discharge: Continued Medical Work up  Expected Discharge Plan and Services     Post Acute Care Choice:  (Memory Care) Living arrangements for the past 2 months: Single Family Home Expected Discharge Date: 03/08/24                                     Social Determinants of Health (SDOH) Interventions SDOH Screenings   Food Insecurity: Patient Unable To Answer (02/27/2024)  Housing: Patient Unable To Answer (02/27/2024)  Transportation Needs: Patient Unable To Answer (02/27/2024)  Utilities: Patient Unable To Answer (02/27/2024)  Social Connections: Unknown (02/27/2024)  Tobacco Use: Low Risk  (02/28/2024)    Readmission Risk Interventions     No data to display

## 2024-03-09 NOTE — Progress Notes (Signed)
 PROGRESS NOTE    [name]  ZOX:[id number] DOB: Oct 30, 1951 DOA: 02/19/2024 PCP: Raymon Mutton., FNP    Brief Narrative:   73 y.o. male with medical history significant of cryptogenic stroke on loop recorder, CVA, dementia with behavioral disturbance, HTN, IIDM, DM neuropathy, BPH, who was found to have syncope episode x 3 during ED stay.   Patient initially brought to ED by family member for evaluation of worsening of agitation and mental declining on February 28.  Subsequently patient has been staying in the ED for the last 10+ days and unable to discharge back home due to increasing risk of mentation deterioration.     Last 2 days, ED physician witnessed at least 3 episodes of syncope.  Unfortunately patient was not on telemonitoring as time but ED staff reported that the patient suddenly became unresponsive symptoms lasted for about 10 seconds and patient recovers consciousness.  Patient does not remember any prodromes however due to ED record, patient did complain about lightheadedness before he lost consciousness.  No seizure activity reported.  ED workup showed hemoglobin 12.4, WBC 6.4, creatinine 1.3 compared to baseline 1.1-1.3, BUN 41   3/11: Patient's right foot pain has worsened in severity preventing effective therapy efforts   3/12: No new complaints   3/13: Right foot pain presumed to be due to acute gout flare has resolved   3/17: Stable and ready for discharge in a.m. to a memory care unit  3/18: Primary care unit could not accept patient today for reasons that are unclear to me   Assessment & Plan:   Principal Problem:   Aggressive behavior due to dementia [facility]) Active Problems:   Syncope  Syncope Secondary to volume depletion Patient appeared to be volume contracted on admission with dark-colored urine  and increasing BUN/creatinine ratio, and some level of AKI.   Kidney function improved with hydration and is back to baseline.  Echocardiogram is  normal Encourage p.o. fluid intake Fall precautions Appreciate PT input DC to memory care unit 3/20       AKI Suspect prerenal azotemia in the setting of Lasix, hydrochlorothiazide, losartan use Renal ultrasound reassuring.  Kidney function improved.  Suggesting prerenal azotemia. Continue to hold Lasix, HCTZ, losartan.  Likely will hold on discharge to avoid hypotension Continue amlodipine for blood pressure control       Right foot pain Acute gout flare  Unknown etiology presumed acute gout flare.  Right foot x-ray imaging survey overall reassuring.  No fractures or dislocations.   Improved pain control on Prednisone 40 mg daily, now steroids stopped Uric acid level is within normal limits     HTN Continue amlodipine     IIDM -Continue metformin -Hemoglobin A1c 5.8.  Good control - Recommend daily CBG on discharge     Dementia with behavioral disturbance -Presumed vascular -Continue memantine and Aricept -as per psychiatry team -- Patient will be discharged to a memory care unit on 03/20   PPD read by RN and MD on 3/11.  Negative.  0 mm induration COVID test is negative   DVT prophylaxis: SQ lovenox Code Status: Full Family Communication:None today Disposition Plan: Status is: Inpatient Remains inpatient appropriate because: Unsafe discharge plan.  Plan to dispo to memory care on 3/20   Level of care: Telemetry Medical  Consultants:  None  Procedures:  None  Antimicrobials: None    Subjective: Seen and examined.  No acute events overnight  Objective: Vitals:   03/08/24 2040 03/09/24 0500 03/09/24 0539 03/09/24  0920  BP: 138/70  (!) 127/52 (!) 144/66  Pulse: 73  74 65  Resp: 18  18 18   Temp: 98 F (36.7 C)  98 F (36.7 C) 97.9 F (36.6 C)  TempSrc:      SpO2: 100%  99% 99%  Weight:  87.6 kg    Height:        Intake/Output Summary (Last 24 hours) at 03/09/2024 1446 Last data filed at 03/08/2024 2244 Gross per 24 hour  Intake --   Output 3 ml  Net -3 ml   Filed Weights   03/07/24 0518 03/08/24 0406 03/09/24 0500  Weight: 87.8 kg 82.5 kg 87.6 kg    Examination:  General exam: Appears calm and comfortable  Respiratory system: Clear to auscultation. Respiratory effort normal. Cardiovascular system: S1-S2, RRR, no murmurs, no pedal edema Gastrointestinal system: Soft, NT/ND, normal bowel sounds Central nervous system: Alert and oriented. No focal neurological deficits. Extremities: Symmetric 5 x 5 power. Skin: No rashes, lesions or ulcers Psychiatry: Judgement and insight appear normal. Mood & affect appropriate.     Data Reviewed: I have personally reviewed following labs and imaging studies  CBC: No results for input(s): "WBC", "NEUTROABS", "HGB", "HCT", "MCV", "PLT" in the last 168 hours. Basic Metabolic Panel: Recent Labs  Lab 03/05/24 0504 03/08/24 1347  NA 138 139  K 3.9 4.6  CL 107 105  CO2 27 29  GLUCOSE 120* 157*  BUN 27* 23  CREATININE 1.24 1.19  CALCIUM 8.4* 8.9   GFR: Estimated Creatinine Clearance: 61 mL/min (by C-G formula based on SCr of 1.19 mg/dL). Liver Function Tests: No results for input(s): "AST", "ALT", "ALKPHOS", "BILITOT", "PROT", "ALBUMIN" in the last 168 hours. No results for input(s): "LIPASE", "AMYLASE" in the last 168 hours. No results for input(s): "AMMONIA" in the last 168 hours. Coagulation Profile: No results for input(s): "INR", "PROTIME" in the last 168 hours. Cardiac Enzymes: No results for input(s): "CKTOTAL", "CKMB", "CKMBINDEX", "TROPONINI" in the last 168 hours. BNP (last 3 results) No results for input(s): "PROBNP" in the last 8760 hours. HbA1C: No results for input(s): "HGBA1C" in the last 72 hours. CBG: Recent Labs  Lab 03/06/24 0447 03/07/24 0505 03/07/24 2126 03/08/24 0514 03/09/24 0635  GLUCAP 108* 120* 92 88 95   Lipid Profile: No results for input(s): "CHOL", "HDL", "LDLCALC", "TRIG", "CHOLHDL", "LDLDIRECT" in the last 72  hours. Thyroid Function Tests: No results for input(s): "TSH", "T4TOTAL", "FREET4", "T3FREE", "THYROIDAB" in the last 72 hours. Anemia Panel: No results for input(s): "VITAMINB12", "FOLATE", "FERRITIN", "TIBC", "IRON", "RETICCTPCT" in the last 72 hours. Sepsis Labs: No results for input(s): "PROCALCITON", "LATICACIDVEN" in the last 168 hours.  Recent Results (from the past 240 hours)  SARS Coronavirus 2 by RT PCR (hospital order, performed in [facility] hospital lab) *cepheid single result test* Anterior Nasal Swab     Status: None   Collection Time: 03/08/24  8:24 AM   Specimen: Anterior Nasal Swab  Result Value Ref Range Status   SARS Coronavirus 2 by RT PCR NEGATIVE NEGATIVE Final    Comment: (NOTE) SARS-CoV-2 target nucleic acids are NOT DETECTED.  The SARS-CoV-2 RNA is generally detectable in upper and lower respiratory specimens during the acute phase of infection. The lowest concentration of SARS-CoV-2 viral copies this assay can detect is 250 copies / mL. A negative result does not preclude SARS-CoV-2 infection and should not be used as the sole basis for treatment or other patient management decisions.  A negative result may occur with  improper specimen collection / handling, submission of specimen other than nasopharyngeal swab, presence of viral mutation(s) within the areas targeted by this assay, and inadequate number of viral copies (<250 copies / mL). A negative result must be combined with clinical observations, patient history, and epidemiological information.  Fact Sheet for Patients:   RoadLapTop.co.za  Fact Sheet for Healthcare Providers: http://kim-miller.com/  This test is not yet approved or  cleared by the [place] FDA and has been authorized for detection and/or diagnosis of SARS-CoV-2 by FDA under an Emergency Use Authorization (EUA).  This EUA will remain in effect (meaning this test can be used) for the  duration of the COVID-19 declaration under Section 564(b)(1) of the Act, 21 U.S.C. section 360bbb-3(b)(1), unless the authorization is terminated or revoked sooner.  Performed at [facility], [address], [place], [place] [address]          Radiology Studies: No results found.      Scheduled Meds:  amLODipine  5 mg Oral Daily   atorvastatin  40 mg Oral q1800   diclofenac Sodium  4 g Topical QID   donepezil  10 mg Oral QHS   enoxaparin (LOVENOX) injection  40 mg Subcutaneous Q24H   latanoprost  1 drop Both Eyes QHS   memantine  10 mg Oral BID   metFORMIN  500 mg Oral BID WC   methocarbamol  500 mg Oral TID   multivitamin with minerals  1 tablet Oral Daily   polyvinyl alcohol  1 drop Both Eyes q AM   potassium chloride SA  20 mEq Oral Daily   QUEtiapine  100 mg Oral QHS   tamsulosin  0.4 mg Oral Daily   traZODone  150 mg Oral QHS   Continuous Infusions:   LOS: 10 days    Tresa Moore, MD [facility]   If 7PM-7AM, please contact night-coverage  03/09/2024, 2:46 PM

## 2024-03-09 NOTE — [place] FL2 (Addendum)
 [place] MEDICAID [id number] LEVEL OF CARE FORM     IDENTIFICATION  Patient Name: [name] Birthdate: 1951/04/08 Sex: male Admission Date (Current Location): 02/19/2024  [facility] and [place] Number:  Chiropodist and Address:  [facility], [address] [place], [place] [address]      Provider Number: [id number]  Attending Physician Name and Address:  Tresa Moore, MD  Relative Name and Phone Number:  Loraine Maple [phone]    Current Level of Care: Hospital Recommended Level of Care: Memory Care [facility]) Prior Approval Number:    Date Approved/Denied:   PASRR Number: [id number]  Discharge Plan: Other (Comment) [facility])    Current Diagnoses: Patient Active Problem List   Diagnosis Date Noted   Syncope 02/27/2024   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) 02/19/2024   Memory change 11/20/2020   Acute cerebrovascular accident (CVA) (HCC) 01/20/2020   Benign essential HTN 01/20/2020   Left homonymous hemianopsia 12/23/2019    Orientation RESPIRATION BLADDER Height & Weight     Self, Place  Normal Continent Weight: 87.6 kg Height:  5\' 7"  (170.2 cm)  BEHAVIORAL SYMPTOMS/MOOD NEUROLOGICAL BOWEL NUTRITION STATUS   (Cooperative, calm.)   Continent Diet (No Added Table Salt Diet)  AMBULATORY STATUS COMMUNICATION OF NEEDS Skin   Extensive Assist Verbally Other (Comment) (skin tear left leg)                       Personal Care Assistance Level of Assistance  Bathing, Feeding, Dressing Bathing Assistance: Maximum assistance Feeding assistance: Maximum assistance Dressing Assistance: Maximum assistance     Functional Limitations Info  Sight, Hearing, Speech Sight Info: Adequate Hearing Info: Adequate Speech Info: Adequate    SPECIAL CARE FACTORS FREQUENCY  PT (By licensed PT), OT (By licensed OT)     PT Frequency: 5 x week OT Frequency: 5 x week             Contractures Contractures Info: Not present    Additional Factors Info  Code Status Code Status Info: Full Code Allergies Info: Shellfish           Current Medications (03/09/2024):  This is the current hospital active medication list Current Facility-Administered Medications  Medication Dose Route Frequency Provider Last Rate Last Admin   amLODipine (NORVASC) tablet 5 mg  5 mg Oral Daily Agbata, Tochukwu, MD   5 mg at 03/09/24 0929   atorvastatin (LIPITOR) tablet 40 mg  40 mg Oral q1800 Merwyn Katos, MD   40 mg at 03/08/24 1712   diclofenac Sodium (VOLTAREN) 1 % topical gel 4 g  4 g Topical QID Lolita Patella B, MD   4 g at 03/09/24 0934   donepezil (ARICEPT) tablet 10 mg  10 mg Oral QHS Merwyn Katos, MD   10 mg at 03/08/24 2154   enoxaparin (LOVENOX) injection 40 mg  40 mg Subcutaneous Q24H Mikey College T, MD   40 mg at 03/08/24 2155   hydrALAZINE (APRESOLINE) injection 5 mg  5 mg Intravenous Q6H PRN Mikey College T, MD       latanoprost (XALATAN) 0.005 % ophthalmic solution 1 drop  1 drop Both Eyes QHS Merwyn Katos, MD   1 drop at 03/08/24 2155   memantine (NAMENDA) tablet 10 mg  10 mg Oral BID Merwyn Katos, MD   10 mg at 03/09/24 0928   metFORMIN (GLUCOPHAGE) tablet 500 mg  500 mg  Oral BID WC Merwyn Katos, MD   500 mg at 03/09/24 1610   methocarbamol (ROBAXIN) tablet 500 mg  500 mg Oral TID Lolita Patella B, MD   500 mg at 03/09/24 9604   multivitamin with minerals tablet 1 tablet  1 tablet Oral Daily Merwyn Katos, MD   1 tablet at 03/09/24 0928   OLANZapine (ZYPREXA) injection 2.5 mg  2.5 mg Intramuscular Q6H PRN Mikey College T, MD   2.5 mg at 02/28/24 1028   polyvinyl alcohol (LIQUIFILM TEARS) 1.4 % ophthalmic solution 1 drop  1 drop Both Eyes q AM Merwyn Katos, MD   1 drop at 03/09/24 0614   potassium chloride SA (KLOR-CON M) CR tablet 20 mEq  20 mEq Oral Daily Merwyn Katos, MD   20 mEq at 03/09/24 5409   QUEtiapine (SEROQUEL) tablet 100 mg  100 mg  Oral QHS Juliann Pares, NP   100 mg at 03/08/24 2154   tamsulosin (FLOMAX) capsule 0.4 mg  0.4 mg Oral Daily Merwyn Katos, MD   0.4 mg at 03/09/24 0930   traZODone (DESYREL) tablet 150 mg  150 mg Oral QHS Merwyn Katos, MD   150 mg at 03/08/24 2152   ziprasidone (GEODON) injection 10 mg  10 mg Intramuscular Q6H PRN Lolita Patella B, MD   10 mg at 02/28/24 1342     Discharge Medications: Please see discharge summary for a list of discharge medications.             Medication Details Next Dose Due Morning Afternoon Evening Bedtime As Needed    amLODipine 5 MG tablet Commonly known as: NORVASC Take 1 tablet (5 mg total) by mouth daily.  Tomorrow morning        aspirin EC 81 MG tablet Take 1 tablet (81 mg total) by mouth daily. Swallow whole. What changed: medication strength See the new instructions.          atorvastatin 40 MG tablet Commonly known as: LIPITOR Take 1 tablet (40 mg total) by mouth daily at 6 PM.  This evening 6:00 p.m.        carvedilol 6.25 MG tablet Commonly known as: COREG Take 6.25 mg by mouth 2 (two) times daily with a meal.  This evening with dinner        donepezil 10 MG tablet Commonly known as: ARICEPT Take 1 tablet (10 mg total) by mouth at bedtime.  Tonight bedtime        latanoprost 0.005 % ophthalmic solution Commonly known as: XALATAN Place 1 drop into both eyes at bedtime.  Tonight bedtime        memantine 10 MG tablet Commonly known as: NAMENDA Take 1 tablet (10 mg total) by mouth 2 (two) times daily.  Tonight bedtime        metFORMIN 500 MG tablet Commonly known as: GLUCOPHAGE Take by mouth 2 (two) times daily with a meal.  This evening with dinner        ONE-A-DAY MENS 50+ PO Take 1 tablet by mouth daily.  Tomorrow morning        QUEtiapine 100 MG tablet Commonly known as: SEROQUEL Take 1 tablet (100 mg total) by mouth at bedtime. What changed: medication strength how much to take  Tonight bedtime        REFRESH OP Place 1  drop into both eyes in the morning and at bedtime.  Resume home schedule        tamsulosin  0.4 MG Caps capsule Commonly known as: FLOMAX Take 0.4 mg by mouth daily.  Tomorrow morning        traZODone 150 MG tablet Commonly known as: DESYREL Take 1 tablet (150 mg total) by mouth at bedtime.  Tonight bedtime        Check blood sugars daily   Relevant Imaging Results:  Relevant Lab Results:   Additional Information [id number]  Cherre Blanc, RN

## 2024-03-10 DIAGNOSIS — F03918 Unspecified dementia, unspecified severity, with other behavioral disturbance: Secondary | ICD-10-CM | POA: Diagnosis not present

## 2024-03-10 NOTE — Discharge Summary (Signed)
 Physician Discharge Summary  Donaldson Richter MVH:[id number] DOB: 09-28-1951 DOA: 02/19/2024  PCP: Raymon Mutton., FNP  Admit date: 02/19/2024 Discharge date: 03/11/24  Admitted From: Home Disposition:  [facility] Memory care unit  Recommendations for Outpatient Follow-up:  Follow up with PCP in 1-2 weeks   Home Health:No Equipment/Devices:None   Discharge Condition:Stable CODE STATUS:FULL  Diet recommendation: Low sodium- no table salt added  Brief/Interim Summary:  73 y.o. male with medical history significant of cryptogenic stroke on loop recorder, CVA, dementia with behavioral disturbance, HTN, IIDM, DM neuropathy, BPH, who was found to have syncope episode x 3 during ED stay.   Patient initially brought to ED by family member for evaluation of worsening of agitation and mental declining on February 28.  Subsequently patient has been staying in the ED for the last 10+ days and unable to discharge back home due to increasing risk of mentation deterioration.     Last 2 days, ED physician witnessed at least 3 episodes of syncope.  Unfortunately patient was not on telemonitoring as time but ED staff reported that the patient suddenly became unresponsive symptoms lasted for about 10 seconds and patient recovers consciousness.  Patient does not remember any prodromes however due to ED record, patient did complain about lightheadedness before he lost consciousness.  No seizure activity reported.  ED workup showed hemoglobin 12.4, WBC 6.4, creatinine 1.3 compared to baseline 1.1-1.3, BUN 41   3/11: Patient's right foot pain has worsened in severity preventing effective therapy efforts   3/12: No new complaints   3/13: Right foot pain presumed to be due to acute gout flare has resolved   3/17: Stable and ready for discharge in a.m. to a memory care unit   3/20: Discharge orders were placed for memory care unit.  Facility refused to take patient citing that discharge orders and summary need  to be completed 24 hours PRIOR to patient being admitted to their facility.  This is not feasible from an inpatient setting.  3/21: Patient remains appropriate for discharge.  DC to [facility] memory care in stable condition      Discharge Diagnoses:  Principal Problem:   Aggressive behavior due to dementia [facility]) Active Problems:   Syncope  Syncope Secondary to volume depletion Patient appeared to be volume contracted on admission with dark-colored urine  and increasing BUN/creatinine ratio, and some level of AKI.   Kidney function improved with hydration and is back to baseline.  Echocardiogram is normal Encourage p.o. fluid intake Fall precautions Appreciate PT input DC to memory care unit 3/20       AKI Suspect prerenal azotemia in the setting of Lasix, hydrochlorothiazide, losartan use Renal ultrasound reassuring.  Kidney function improved.  Suggesting prerenal azotemia. Continue to hold Lasix, HCTZ, losartan.  Likely will hold on discharge to avoid hypotension Continue amlodipine for blood pressure control       Right foot pain Acute gout flare  Unknown etiology presumed acute gout flare.  Right foot x-ray imaging survey overall reassuring.  No fractures or dislocations.   Improved pain control on Prednisone 40 mg daily, now steroids stopped Uric acid level is within normal limits     HTN Continue amlodipine     IIDM -Continue metformin -Hemoglobin A1c 5.8.  Good control - Recommend daily CBG on discharge     Dementia with behavioral disturbance -Presumed vascular -Continue memantine and Aricept -as per psychiatry team -- Patient will be discharged to a memory care unit on 03/20   PPD  read by RN and MD on 3/11.  Negative.  0 mm induration COVID test is negative   Discharge Instructions  Discharge Instructions     Diet - low sodium heart healthy   Complete by: As directed    Diet Carb Modified   Complete by: As directed    Diet general   Complete by: As  directed    Low sodium- no table salt added   Discharge instructions   Complete by: As directed    Check blood sugars daily   Increase activity slowly   Complete by: As directed    Increase activity slowly   Complete by: As directed    No wound care   Complete by: As directed    No wound care   Complete by: As directed    No wound care   Complete by: As directed       Allergies as of 03/10/2024       Reactions   Shellfish Allergy Anaphylaxis        Medication List     STOP taking these medications    furosemide 40 MG tablet Commonly known as: LASIX   hydrochlorothiazide 25 MG tablet Commonly known as: HYDRODIURIL   losartan 50 MG tablet Commonly known as: COZAAR   potassium chloride SA 20 MEQ tablet Commonly known as: KLOR-CON M       TAKE these medications    amLODipine 5 MG tablet Commonly known as: NORVASC Take 1 tablet (5 mg total) by mouth daily.   aspirin EC 81 MG tablet Take 1 tablet (81 mg total) by mouth daily. Swallow whole. What changed:  medication strength See the new instructions.   atorvastatin 40 MG tablet Commonly known as: LIPITOR Take 1 tablet (40 mg total) by mouth daily at 6 PM.   carvedilol 6.25 MG tablet Commonly known as: COREG Take 6.25 mg by mouth 2 (two) times daily with a meal.   donepezil 10 MG tablet Commonly known as: ARICEPT Take 1 tablet (10 mg total) by mouth at bedtime.   latanoprost 0.005 % ophthalmic solution Commonly known as: XALATAN Place 1 drop into both eyes at bedtime.   memantine 10 MG tablet Commonly known as: NAMENDA Take 1 tablet (10 mg total) by mouth 2 (two) times daily.   metFORMIN 500 MG tablet Commonly known as: GLUCOPHAGE Take by mouth 2 (two) times daily with a meal.   ONE-A-DAY MENS 50+ PO Take 1 tablet by mouth daily.   QUEtiapine 100 MG tablet Commonly known as: SEROQUEL Take 1 tablet (100 mg total) by mouth at bedtime. What changed:  medication strength how much to  take   REFRESH OP Place 1 drop into both eyes in the morning and at bedtime.   tamsulosin 0.4 MG Caps capsule Commonly known as: FLOMAX Take 0.4 mg by mouth daily.   traZODone 150 MG tablet Commonly known as: DESYREL Take 1 tablet (150 mg total) by mouth at bedtime.        Follow-up Information     Raymon Mutton., FNP Follow up.   Specialty: Family Medicine Why: Hospital follow up Contact information: [address] [place] [address] [phone]                Allergies  Allergen Reactions   Shellfish Allergy Anaphylaxis    Consultations: Psychiatry    Procedures/Studies: ECHOCARDIOGRAM COMPLETE Result Date: 02/29/2024    ECHOCARDIOGRAM REPORT   Patient Name:   Sota Ospina Date of Exam:  02/29/2024 Medical Rec #:  [id number]        Height:       67.0 in Accession #:    [id number]       Weight:       183.6 lb Date of Birth:  [birth year]       BSA:          1.950 m Patient Age:    72 years         BP:           128/54 mmHg Patient Gender: M                HR:           69 bpm. Exam Location:  [facility] Procedure: 2D Echo, Cardiac Doppler and Color Doppler (Both Spectral and Color            Flow Doppler were utilized during procedure). Indications:     Syncope R55  History:         Patient has prior history of Echocardiogram examinations, most                  recent 01/13/2023. Risk Factors:Hypertension. Dementia.  Sonographer:     Cristela Blue Referring Phys:  [id number] Emeline General Diagnosing Phys: Lorine Bears MD IMPRESSIONS  1. Left ventricular ejection fraction, by estimation, is 55 to 60%. The left ventricle has normal function. The left ventricle has no regional wall motion abnormalities. There is moderate left ventricular hypertrophy. Left ventricular diastolic parameters were normal.  2. Right ventricular systolic function is normal. The right ventricular size is normal. Tricuspid regurgitation signal is inadequate for assessing PA pressure.  3. The mitral valve  is normal in structure. No evidence of mitral valve regurgitation. No evidence of mitral stenosis.  4. The aortic valve is normal in structure. Aortic valve regurgitation is trivial. Aortic valve sclerosis is present, with no evidence of aortic valve stenosis. FINDINGS  Left Ventricle: Left ventricular ejection fraction, by estimation, is 55 to 60%. The left ventricle has normal function. The left ventricle has no regional wall motion abnormalities. The left ventricular internal cavity size was normal in size. There is  moderate left ventricular hypertrophy. Left ventricular diastolic parameters were normal. Right Ventricle: The right ventricular size is normal. No increase in right ventricular wall thickness. Right ventricular systolic function is normal. Tricuspid regurgitation signal is inadequate for assessing PA pressure. Left Atrium: Left atrial size was normal in size. Right Atrium: Right atrial size was normal in size. Pericardium: There is no evidence of pericardial effusion. Mitral Valve: The mitral valve is normal in structure. No evidence of mitral valve regurgitation. No evidence of mitral valve stenosis. MV peak gradient, 4.0 mmHg. The mean mitral valve gradient is 2.0 mmHg. Tricuspid Valve: The tricuspid valve is normal in structure. Tricuspid valve regurgitation is not demonstrated. No evidence of tricuspid stenosis. Aortic Valve: The aortic valve is normal in structure. Aortic valve regurgitation is trivial. Aortic valve sclerosis is present, with no evidence of aortic valve stenosis. Aortic valve mean gradient measures 3.0 mmHg. Aortic valve peak gradient measures 7.0 mmHg. Aortic valve area, by VTI measures 3.57 cm. Pulmonic Valve: The pulmonic valve was normal in structure. Pulmonic valve regurgitation is not visualized. No evidence of pulmonic stenosis. Aorta: The aortic root is normal in size and structure. Venous: The inferior vena cava was not well visualized. IAS/Shunts: No atrial level  shunt detected by color flow Doppler.  LEFT VENTRICLE PLAX 2D  LVIDd:         4.00 cm   Diastology LVIDs:         2.80 cm   LV e' medial:    10.60 cm/s LV PW:         1.50 cm   LV E/e' medial:  8.3 LV IVS:        1.60 cm   LV e' lateral:   15.10 cm/s LVOT diam:     2.00 cm   LV E/e' lateral: 5.8 LV SV:         83 LV SV Index:   43 LVOT Area:     3.14 cm  RIGHT VENTRICLE RV Basal diam:  3.30 cm RV Mid diam:    2.30 cm RV S prime:     10.80 cm/s TAPSE (M-mode): 2.2 cm LEFT ATRIUM             Index        RIGHT ATRIUM           Index LA diam:        3.50 cm 1.79 cm/m   RA Area:     11.90 cm LA Vol (A2C):   30.2 ml 15.49 ml/m  RA Volume:   22.20 ml  11.38 ml/m LA Vol (A4C):   38.5 ml 19.74 ml/m LA Biplane Vol: 34.0 ml 17.43 ml/m  AORTIC VALVE AV Area (Vmax):    2.78 cm AV Area (Vmean):   2.88 cm AV Area (VTI):     3.57 cm AV Vmax:           132.00 cm/s AV Vmean:          83.100 cm/s AV VTI:            0.232 m AV Peak Grad:      7.0 mmHg AV Mean Grad:      3.0 mmHg LVOT Vmax:         117.00 cm/s LVOT Vmean:        76.300 cm/s LVOT VTI:          0.264 m LVOT/AV VTI ratio: 1.14  AORTA Ao Root diam: 3.60 cm MITRAL VALVE               TRICUSPID VALVE MV Area (PHT): 3.28 cm    TR Peak grad:   11.2 mmHg MV Area VTI:   3.03 cm    TR Vmax:        167.00 cm/s MV Peak grad:  4.0 mmHg MV Mean grad:  2.0 mmHg    SHUNTS MV Vmax:       1.00 m/s    Systemic VTI:  0.26 m MV Vmean:      63.3 cm/s   Systemic Diam: 2.00 cm MV Decel Time: 231 msec MV E velocity: 87.50 cm/s MV A velocity: 82.80 cm/s MV E/A ratio:  1.06 Lorine Bears MD Electronically signed by Lorine Bears MD Signature Date/Time: 02/29/2024/9:24:45 AM    Final    DG Ankle Complete Right Result Date: 02/28/2024 CLINICAL DATA:  [id number] Pain [id number] EXAM: RIGHT ANKLE - COMPLETE 3+ VIEW COMPARISON:  February 27, 2024. FINDINGS: No acute fracture or dislocation. Ankle mortise is preserved. Scattered midfoot degenerative changes. Bidirectional calcaneal enthesophytes. No  area of erosion or osseous destruction. No unexpected radiopaque foreign body. Vascular calcifications. IMPRESSION: 1. No acute fracture or dislocation. Electronically Signed   By: Meda Klinefelter M.D.   On: 02/28/2024 13:31   US RENAL Result Date: 02/27/2024  CLINICAL DATA:  [id number] AKI (acute kidney injury) (HCC) [id number] EXAM: RENAL / URINARY TRACT ULTRASOUND COMPLETE COMPARISON:  None Available. FINDINGS: Right Kidney: Renal measurements: 10.0 x 4.5 x 4.9 cm = volume: 114 mL. Mildly echogenic right renal parenchyma, normal thickness. No hydronephrosis. No renal mass. Left Kidney: Renal measurements: 9.7 x 6.0 x 4.6 cm = volume: 139 mL. Mildly echogenic left renal parenchyma, normal thickness. No hydronephrosis. No renal mass. Bladder: Appears normal for degree of bladder distention. Ureteral jets seen bilaterally in the bladder lumen. Other: None. IMPRESSION: Echogenic normal size kidneys, compatible with reported history of nonspecific acute renal parenchymal disease. No hydronephrosis. Electronically Signed   By: Delbert Phenix M.D.   On: 02/27/2024 11:41   CT Head Wo Contrast Result Date: 02/27/2024 CLINICAL DATA:  Mental status changes. EXAM: CT HEAD WITHOUT CONTRAST TECHNIQUE: Contiguous axial images were obtained from the base of the skull through the vertex without intravenous contrast. RADIATION DOSE REDUCTION: This exam was performed according to the departmental dose-optimization program which includes automated exposure control, adjustment of the mA and/or kV according to patient size and/or use of iterative reconstruction technique. COMPARISON:  05/07/2023 FINDINGS: Brain: Chronic right hemispheric infarct again noted. There is no evidence for acute hemorrhage, hydrocephalus, mass lesion, or abnormal extra-axial fluid collection. No definite CT evidence for acute infarction. Lacunar infarct again noted left basal ganglia. Vascular: No hyperdense vessel or unexpected calcification. Skull: No evidence  for fracture. No worrisome lytic or sclerotic lesion. Sinuses/Orbits: The visualized paranasal sinuses and mastoid air cells are clear. Visualized portions of the globes and intraorbital fat are unremarkable. Other: None. IMPRESSION: 1. No acute intracranial abnormality. 2. Chronic right hemispheric infarct. 3. Stable lacunar infarct left basal ganglia. Electronically Signed   By: Kennith Center M.D.   On: 02/27/2024 07:59   DG Chest Port 1 View Result Date: 02/27/2024 CLINICAL DATA:  Larey Seat yesterday, right-sided foot and hip pain. Syncopal episode. EXAM: PORTABLE CHEST 1 VIEW RIGHT HIP 3 VIEWS RIGHT FOOT 3 VIEWS COMPARISON:  Chest CT without contrast 11/10/2023, right femoral series 11/24/2020. No prior right foot. FINDINGS: Chest AP portable 6:44 a.m.: The lungs are clear. The sulci are sharp. Heart size and vasculature are normal apart from aortic atherosclerosis and tortuosity. There is thoracic spondylosis.  Thoracic cage grossly intact. AP pelvis, AP and frog-leg right hip (three views): No evidence of right hip fracture or dislocation. Normal bone mineralization. No evidence for pelvic fracture or diastasis. There is mild symmetric arthrosis of the hip joints. There is pelvic enthesopathy. Dystrophic calcifications in the prostate noted with otherwise unremarkable soft tissues. The SI joints are unremarkable, as visualized. Right foot, three views: There is swelling in the forefoot. Normal bone mineralization. No evidence of fracture, dislocation or primary pathologic process. There are mild degenerative changes in the toe joints and midfoot, moderate plantar and dorsal calcaneal spurring changes, spurring at the tibiotalar joint, and vascular calcifications in the dorsal distal foreleg. IMPRESSION: 1. No evidence of acute chest disease. Aortic atherosclerosis. 2. No evidence of right hip fracture or dislocation. 3. No evidence of right foot fracture or dislocation. Degenerative changes. 4. Forefoot swelling.  Electronically Signed   By: Almira Bar M.D.   On: 02/27/2024 07:24   DG Foot Complete Right Result Date: 02/27/2024 CLINICAL DATA:  Larey Seat yesterday, right-sided foot and hip pain. Syncopal episode. EXAM: PORTABLE CHEST 1 VIEW RIGHT HIP 3 VIEWS RIGHT FOOT 3 VIEWS COMPARISON:  Chest CT without contrast 11/10/2023, right femoral series 11/24/2020. No prior  right foot. FINDINGS: Chest AP portable 6:44 a.m.: The lungs are clear. The sulci are sharp. Heart size and vasculature are normal apart from aortic atherosclerosis and tortuosity. There is thoracic spondylosis.  Thoracic cage grossly intact. AP pelvis, AP and frog-leg right hip (three views): No evidence of right hip fracture or dislocation. Normal bone mineralization. No evidence for pelvic fracture or diastasis. There is mild symmetric arthrosis of the hip joints. There is pelvic enthesopathy. Dystrophic calcifications in the prostate noted with otherwise unremarkable soft tissues. The SI joints are unremarkable, as visualized. Right foot, three views: There is swelling in the forefoot. Normal bone mineralization. No evidence of fracture, dislocation or primary pathologic process. There are mild degenerative changes in the toe joints and midfoot, moderate plantar and dorsal calcaneal spurring changes, spurring at the tibiotalar joint, and vascular calcifications in the dorsal distal foreleg. IMPRESSION: 1. No evidence of acute chest disease. Aortic atherosclerosis. 2. No evidence of right hip fracture or dislocation. 3. No evidence of right foot fracture or dislocation. Degenerative changes. 4. Forefoot swelling. Electronically Signed   By: Almira Bar M.D.   On: 02/27/2024 07:24   DG Hip Unilat With Pelvis 2-3 Views Right Result Date: 02/27/2024 CLINICAL DATA:  Larey Seat yesterday, right-sided foot and hip pain. Syncopal episode. EXAM: PORTABLE CHEST 1 VIEW RIGHT HIP 3 VIEWS RIGHT FOOT 3 VIEWS COMPARISON:  Chest CT without contrast 11/10/2023, right femoral  series 11/24/2020. No prior right foot. FINDINGS: Chest AP portable 6:44 a.m.: The lungs are clear. The sulci are sharp. Heart size and vasculature are normal apart from aortic atherosclerosis and tortuosity. There is thoracic spondylosis.  Thoracic cage grossly intact. AP pelvis, AP and frog-leg right hip (three views): No evidence of right hip fracture or dislocation. Normal bone mineralization. No evidence for pelvic fracture or diastasis. There is mild symmetric arthrosis of the hip joints. There is pelvic enthesopathy. Dystrophic calcifications in the prostate noted with otherwise unremarkable soft tissues. The SI joints are unremarkable, as visualized. Right foot, three views: There is swelling in the forefoot. Normal bone mineralization. No evidence of fracture, dislocation or primary pathologic process. There are mild degenerative changes in the toe joints and midfoot, moderate plantar and dorsal calcaneal spurring changes, spurring at the tibiotalar joint, and vascular calcifications in the dorsal distal foreleg. IMPRESSION: 1. No evidence of acute chest disease. Aortic atherosclerosis. 2. No evidence of right hip fracture or dislocation. 3. No evidence of right foot fracture or dislocation. Degenerative changes. 4. Forefoot swelling. Electronically Signed   By: Almira Bar M.D.   On: 02/27/2024 07:24   DG Knee Complete 4 Views Right Result Date: 02/27/2024 CLINICAL DATA:  Status post fall. EXAM: RIGHT KNEE - COMPLETE 4+ VIEW COMPARISON:  December 25, 2021 FINDINGS: No evidence of an acute fracture or dislocation. Moderate to marked severity tricompartmental degenerative changes are seen. A small joint effusion is noted. IMPRESSION: 1. Moderate to marked severity tricompartmental degenerative changes. 2. Small joint effusion. Electronically Signed   By: Aram Candela M.D.   On: 02/27/2024 00:47      Subjective: Seen and examined on day of discharge.  Stable, appropriate for transfer to memory  care unit.  Discharge Exam: Vitals:   03/09/24 2040 03/10/24 0553  BP: 126/61 (!) 118/58  Pulse: 65 66  Resp: 18 17  Temp: 98.8 F (37.1 C) 98.8 F (37.1 C)  SpO2: 100% 98%   Vitals:   03/09/24 0920 03/09/24 1656 03/09/24 2040 03/10/24 0553  BP: (!) 144/66 Marland Kitchen)  135/56 126/61 (!) 118/58  Pulse: 65 61 65 66  Resp: 18 16 18 17   Temp: 97.9 F (36.6 C) 98.5 F (36.9 C) 98.8 F (37.1 C) 98.8 F (37.1 C)  TempSrc:  Oral Oral   SpO2: 99% 100% 100% 98%  Weight:      Height:        General: Pt is alert, awake, not in acute distress Cardiovascular: RRR, S1/S2 +, no rubs, no gallops Respiratory: CTA bilaterally, no wheezing, no rhonchi Abdominal: Soft, NT, ND, bowel sounds + Extremities: no edema, no cyanosis    The results of significant diagnostics from this hospitalization (including imaging, microbiology, ancillary and laboratory) are listed below for reference.     Microbiology: Recent Results (from the past 240 hours)  SARS Coronavirus 2 by RT PCR (hospital order, performed in [facility] hospital lab) *cepheid single result test* Anterior Nasal Swab     Status: None   Collection Time: 03/08/24  8:24 AM   Specimen: Anterior Nasal Swab  Result Value Ref Range Status   SARS Coronavirus 2 by RT PCR NEGATIVE NEGATIVE Final    Comment: (NOTE) SARS-CoV-2 target nucleic acids are NOT DETECTED.  The SARS-CoV-2 RNA is generally detectable in upper and lower respiratory specimens during the acute phase of infection. The lowest concentration of SARS-CoV-2 viral copies this assay can detect is 250 copies / mL. A negative result does not preclude SARS-CoV-2 infection and should not be used as the sole basis for treatment or other patient management decisions.  A negative result may occur with improper specimen collection / handling, submission of specimen other than nasopharyngeal swab, presence of viral mutation(s) within the areas targeted by this assay, and inadequate number  of viral copies (<250 copies / mL). A negative result must be combined with clinical observations, patient history, and epidemiological information.  Fact Sheet for Patients:   RoadLapTop.co.za  Fact Sheet for Healthcare Providers: http://kim-miller.com/  This test is not yet approved or  cleared by the [place] FDA and has been authorized for detection and/or diagnosis of SARS-CoV-2 by FDA under an Emergency Use Authorization (EUA).  This EUA will remain in effect (meaning this test can be used) for the duration of the COVID-19 declaration under Section 564(b)(1) of the Act, 21 U.S.C. section 360bbb-3(b)(1), unless the authorization is terminated or revoked sooner.  Performed at [facility], [address] [place], [place] [address]      Labs: BNP (last 3 results) No results for input(s): "BNP" in the last 8760 hours. Basic Metabolic Panel: Recent Labs  Lab 03/05/24 0504 03/08/24 1347  NA 138 139  K 3.9 4.6  CL 107 105  CO2 27 29  GLUCOSE 120* 157*  BUN 27* 23  CREATININE 1.24 1.19  CALCIUM 8.4* 8.9   Liver Function Tests: No results for input(s): "AST", "ALT", "ALKPHOS", "BILITOT", "PROT", "ALBUMIN" in the last 168 hours. No results for input(s): "LIPASE", "AMYLASE" in the last 168 hours. No results for input(s): "AMMONIA" in the last 168 hours. CBC: No results for input(s): "WBC", "NEUTROABS", "HGB", "HCT", "MCV", "PLT" in the last 168 hours. Cardiac Enzymes: No results for input(s): "CKTOTAL", "CKMB", "CKMBINDEX", "TROPONINI" in the last 168 hours. BNP: Invalid input(s): "POCBNP" CBG: Recent Labs  Lab 03/06/24 0447 03/07/24 0505 03/07/24 2126 03/08/24 0514 03/09/24 0635  GLUCAP 108* 120* 92 88 95   D-Dimer No results for input(s): "DDIMER" in the last 72 hours. Hgb A1c No results for input(s): "HGBA1C" in the last 72  hours. Lipid Profile No results for input(s): "CHOL", "HDL", "LDLCALC",  "TRIG", "CHOLHDL", "LDLDIRECT" in the last 72 hours. Thyroid function studies No results for input(s): "TSH", "T4TOTAL", "T3FREE", "THYROIDAB" in the last 72 hours.  Invalid input(s): "FREET3" Anemia work up No results for input(s): "VITAMINB12", "FOLATE", "FERRITIN", "TIBC", "IRON", "RETICCTPCT" in the last 72 hours. Urinalysis    Component Value Date/Time   COLORURINE YELLOW (A) 02/27/2024 0807   APPEARANCEUR CLEAR (A) 02/27/2024 0807   LABSPEC 1.017 02/27/2024 0807   PHURINE 6.0 02/27/2024 0807   GLUCOSEU NEGATIVE 02/27/2024 0807   HGBUR NEGATIVE 02/27/2024 0807   BILIRUBINUR NEGATIVE 02/27/2024 0807   KETONESUR NEGATIVE 02/27/2024 0807   PROTEINUR NEGATIVE 02/27/2024 0807   NITRITE NEGATIVE 02/27/2024 0807   LEUKOCYTESUR NEGATIVE 02/27/2024 0807   Sepsis Labs No results for input(s): "WBC" in the last 168 hours.  Invalid input(s): "PROCALCITONIN", "LACTICIDVEN" Microbiology Recent Results (from the past 240 hours)  SARS Coronavirus 2 by RT PCR (hospital order, performed in [facility] hospital lab) *cepheid single result test* Anterior Nasal Swab     Status: None   Collection Time: 03/08/24  8:24 AM   Specimen: Anterior Nasal Swab  Result Value Ref Range Status   SARS Coronavirus 2 by RT PCR NEGATIVE NEGATIVE Final    Comment: (NOTE) SARS-CoV-2 target nucleic acids are NOT DETECTED.  The SARS-CoV-2 RNA is generally detectable in upper and lower respiratory specimens during the acute phase of infection. The lowest concentration of SARS-CoV-2 viral copies this assay can detect is 250 copies / mL. A negative result does not preclude SARS-CoV-2 infection and should not be used as the sole basis for treatment or other patient management decisions.  A negative result may occur with improper specimen collection / handling, submission of specimen other than nasopharyngeal swab, presence of viral mutation(s) within the areas targeted by this assay, and inadequate number of  viral copies (<250 copies / mL). A negative result must be combined with clinical observations, patient history, and epidemiological information.  Fact Sheet for Patients:   RoadLapTop.co.za  Fact Sheet for Healthcare Providers: http://kim-miller.com/  This test is not yet approved or  cleared by the [place] FDA and has been authorized for detection and/or diagnosis of SARS-CoV-2 by FDA under an Emergency Use Authorization (EUA).  This EUA will remain in effect (meaning this test can be used) for the duration of the COVID-19 declaration under Section 564(b)(1) of the Act, 21 U.S.C. section 360bbb-3(b)(1), unless the authorization is terminated or revoked sooner.  Performed at [facility], [address] [place], [place] [address]      Time coordinating discharge: Over 30 minutes  SIGNED:   Tresa Moore, MD  [facility] 03/11/24 Pager   If 7PM-7AM, please contact night-coverage

## 2024-03-10 NOTE — Progress Notes (Signed)
 Mobility Specialist - Progress Note   03/10/24 1000  Mobility  Activity Ambulated with assistance in hallway  Level of Assistance Standby assist, set-up cues, supervision of patient - no hands on  Assistive Device Front wheel walker  Distance Ambulated (ft) 160 ft  Activity Response Tolerated well  Mobility visit 1 Mobility     Pt sleeping in recliner upon arrival, utilizing RA. Pt awakened to voice and agreeable to activity. STS and ambulation in hallway with supervision. Directional cueing. No complaints. Pt returned to chair with alarm set, needs in reach.    Filiberto Pinks Mobility Specialist 03/10/24, 10:46 AM

## 2024-03-10 NOTE — TOC Progression Note (Signed)
 Transition of Care [facility]) - Progression Note    Patient Details  Name: Marcial Pless MRN: [id number] Date of Birth: 09-12-1951  [facility] CM/SW Contact  Cherre Blanc, RN Phone Number: 03/10/2024, 4:26 PM  Clinical Narrative:     TCT [facility] and confirmed the patient is all set to be received by the facility tomorrow morning at 11:00 AM. TOC will continue to follow.  Expected Discharge Plan: Memory Care Barriers to Discharge: Continued Medical Work up  Expected Discharge Plan and Services     Post Acute Care Choice:  (Memory Care) Living arrangements for the past 2 months: Single Family Home Expected Discharge Date: 03/10/24                                     Social Determinants of Health (SDOH) Interventions SDOH Screenings   Food Insecurity: Patient Unable To Answer (02/27/2024)  Housing: Patient Unable To Answer (02/27/2024)  Transportation Needs: Patient Unable To Answer (02/27/2024)  Utilities: Patient Unable To Answer (02/27/2024)  Social Connections: Unknown (02/27/2024)  Tobacco Use: Low Risk  (02/28/2024)    Readmission Risk Interventions     No data to display

## 2024-03-11 ENCOUNTER — Ambulatory Visit: Payer: Medicare HMO

## 2024-03-11 ENCOUNTER — Telehealth: Payer: Self-pay

## 2024-03-11 DIAGNOSIS — F03918 Unspecified dementia, unspecified severity, with other behavioral disturbance: Secondary | ICD-10-CM | POA: Diagnosis not present

## 2024-03-11 DIAGNOSIS — R55 Syncope and collapse: Secondary | ICD-10-CM | POA: Diagnosis not present

## 2024-03-11 LAB — CUP PACEART REMOTE DEVICE CHECK
Date Time Interrogation Session: 20250320230209
Implantable Pulse Generator Implant Date: 20210201

## 2024-03-11 LAB — GLUCOSE, CAPILLARY: Glucose-Capillary: 81 mg/dL (ref 70–99)

## 2024-03-11 NOTE — Telephone Encounter (Signed)
 I spoke with the nurse at [facility]. I let them know that all they have to do is plug the monitor up by his bedside and the monitor will automatically updated.

## 2024-03-21 ENCOUNTER — Ambulatory Visit: Payer: Medicare HMO

## 2024-03-29 NOTE — Progress Notes (Signed)
 [facility] - Clinic Note  04/04/2024     CHIEF COMPLAINT Patient presents for Retina Follow Up   HISTORY OF PRESENT ILLNESS: [name] is a 73 y.o. male who presents to the clinic today for:   HPI     Retina Follow Up   Patient presents with  Diabetic Retinopathy.  In both eyes.  This started 8 weeks ago.  I, the attending physician,  performed the HPI with the patient and updated documentation appropriately.        Comments   Patient here for 8 weeks (12 weeks) retina follow up for NPDR OU. Patient states vision doing fine. No eye pain. Using drops.       Last edited by Lysandra Loughmiller, MD on 04/04/2024 12:05 PM.    Pt is delayed from 8 weeks to 12.5 weeks due to being in the hospital for dementia, he feels like his vision is fine. Pt is now in a memory care facility   Referring physician: Shannan Dart., FNP [address] [place],  [place] [address]  HISTORICAL INFORMATION:   Selected notes from the MEDICAL RECORD NUMBER Referred by Dr. Diedre Fox for concern of HTN Ret   CURRENT MEDICATIONS: Current Outpatient Medications (Ophthalmic Drugs)  Medication Sig   latanoprost (XALATAN) 0.005 % ophthalmic solution Place 1 drop into both eyes at bedtime.   Polyvinyl Alcohol-Povidone (REFRESH OP) Place 1 drop into both eyes in the morning and at bedtime.   No current facility-administered medications for this visit. (Ophthalmic Drugs)   Current Outpatient Medications (Other)  Medication Sig   amLODipine (NORVASC) 5 MG tablet Take 1 tablet (5 mg total) by mouth daily.   aspirin EC 81 MG tablet Take 1 tablet (81 mg total) by mouth daily. Swallow whole.   atorvastatin (LIPITOR) 40 MG tablet Take 1 tablet (40 mg total) by mouth daily at 6 PM.   carvedilol (COREG) 6.25 MG tablet Take 6.25 mg by mouth 2 (two) times daily with a meal.   donepezil (ARICEPT) 10 MG tablet Take 1 tablet (10 mg total) by mouth at bedtime.   memantine (NAMENDA) 10 MG  tablet Take 1 tablet (10 mg total) by mouth 2 (two) times daily.   metFORMIN (GLUCOPHAGE) 500 MG tablet Take by mouth 2 (two) times daily with a meal.   Multiple Vitamins-Minerals (ONE-A-DAY MENS 50+ PO) Take 1 tablet by mouth daily.   QUEtiapine (SEROQUEL) 100 MG tablet Take 1 tablet (100 mg total) by mouth at bedtime.   tamsulosin (FLOMAX) 0.4 MG CAPS capsule Take 0.4 mg by mouth daily.   traZODone (DESYREL) 150 MG tablet Take 1 tablet (150 mg total) by mouth at bedtime.   No current facility-administered medications for this visit. (Other)   REVIEW OF SYSTEMS: ROS   Positive for: Endocrine, Eyes Negative for: Constitutional, Gastrointestinal, Neurological, Skin, Genitourinary, Musculoskeletal, HENT, Cardiovascular, Respiratory, Psychiatric, Allergic/Imm, Heme/Lymph Last edited by Sylvan Evener, COA on 04/04/2024  8:26 AM.     ALLERGIES Allergies  Allergen Reactions   Shellfish Allergy Anaphylaxis   PAST MEDICAL HISTORY Past Medical History:  Diagnosis Date   Acute cerebrovascular accident (CVA) (HCC) 01/20/2020   Benign essential HTN 01/20/2020   Cataract    Mixed form OU   Dementia (HCC)    Diabetic retinopathy (HCC)    NPDR OU   Hypertensive retinopathy    OU   Seizures (HCC)    per pt- "none since teenage years"   Past Surgical History:  Procedure Laterality Date   LOOP RECORDER INSERTION N/A 01/23/2020   Procedure: LOOP RECORDER INSERTION;  Surgeon: Verona Goodwill, MD;  Location: [facility] INVASIVE CV LAB;  Service: Cardiovascular;  Laterality: N/A;   FAMILY HISTORY Family History  Problem Relation Age of Onset   COPD Mother    Diabetes Mother    Dementia Father    Diabetes Father    Cancer Father        prostate and lung   Colon cancer Neg Hx    Esophageal cancer Neg Hx    Stomach cancer Neg Hx    Rectal cancer Neg Hx    SOCIAL HISTORY Social History   Tobacco Use   Smoking status: Never   Smokeless tobacco: Never  Vaping Use   Vaping status: Never  Used  Substance Use Topics   Alcohol use: Never   Drug use: Never       OPHTHALMIC EXAM: Base Eye Exam     Visual Acuity (Snellen - Linear)       Right Left   Dist [place] 20/70 20/40   Dist ph [place] NI 20/30         Tonometry (Tonopen, 8:23 AM)       Right Left   Pressure 14 15         Pupils       Dark Light Shape React APD   Right 2 1 Round Brisk None   Left 2 1 Round Brisk None         Visual Fields (Counting fingers)       Left Right    Full Full         Extraocular Movement       Right Left    Full, Ortho Full, Ortho         Neuro/Psych     Oriented x3: Yes   Mood/Affect: Normal         Dilation     Both eyes: 1.0% Mydriacyl, 2.5% Phenylephrine @ 8:23 AM           Slit Lamp and Fundus Exam     Slit Lamp Exam       Right Left   Lids/Lashes Dermatochalasis - upper lid, MGD, Ptosis Dermatochalasis - upper lid   Conjunctiva/Sclera Nasal and temporal Pinguecula, Melanosis Nasal and temporal Pinguecula, mild Melanosis   Cornea Arcus, well healed cataract wound, no dendrites, pigmented KP inferiorly, 1+ fine Punctate epithelial erosions Arcus, 2+fine Punctate epithelial erosions, well healed cataract wound   Anterior Chamber deep and clear, no cell or flare deep and clear, no cell or flare   Iris Round and dilated, No NVI Round and dilated, No NVI   Lens PC IOL in good position PC IOL in good postition   Anterior Vitreous Vitreous syneresis, vitreous condensations clearing Vitreous syneresis, Posterior vitreous detachment, fine pigment         Fundus Exam       Right Left   Disc sharp rim, 2-3+pallor, +cupping Sharp, mild Pallor   C/D Ratio 0.6 0.5   Macula Flat, good foveal reflex, scattered MA / DBH greatest superior mac, trace cystic changes -- slightly increased Flat, good foveal reflex, scattered Microaneurysms/DBH, cystic changes IT macula   Vessels attenuated, tortuous, focal flame heme along ST arcades -- resolved attenuated,  Tortuous, Telangiectasia   Periphery Attached, operculated hole at 1000 with partial pigment and +cuff of SRF -- good laser changes surrounding, scattered MA/DBH greatest posteriorly, White without pressure temporal periphery,  good peripheral PRP changes 360, scattered DBH greatest superiorly Attached, scattered DBH, focal pigmented CR scar at 0130 equator, good peripheral 360 PRP           IMAGING AND PROCEDURES  Imaging and Procedures for @TODAY @  OCT, Retina - OU - Both Eyes       Right Eye Quality was borderline. Central Foveal Thickness: 255. Progression has worsened. Findings include normal foveal contour, no IRF, no SRF (mild diffuse retinal thinning -- stable, interval increase in focal cystic changes nasal fovea, persistent vitreous opacities -- slightly improved).   Left Eye Quality was borderline. Central Foveal Thickness: 254. Progression has improved. Findings include normal foveal contour, no SRF, intraretinal hyper-reflective material, intraretinal fluid (Mild interval improvement in IRF / IRHM temporal fovea and mac, +vitreous opacities -- improved).   Notes *Images captured and stored on drive  Diagnosis / Impression:  OD: NFP, no IRF / SRF -- mild diffuse retinal thinning -- stable, interval increase in focal cystic changes nasal fovea, persistent vitreous opacities -- slightly improved OS: NFP, no SRF, mild interval improvement in IRF / IRHM temporal fovea and mac, +vitreous opacities -- improved   Clinical management:  See below  Abbreviations: NFP - Normal foveal profile. CME - cystoid macular edema. PED - pigment epithelial detachment. IRF - intraretinal fluid. SRF - subretinal fluid. EZ - ellipsoid zone. ERM - epiretinal membrane. ORA - outer retinal atrophy. ORT - outer retinal tubulation. SRHM - subretinal hyper-reflective material      Intravitreal Injection, Pharmacologic Agent - OD - Right Eye       Time Out 04/04/2024. 8:50 AM. Confirmed correct  patient, procedure, site, and patient consented.   Anesthesia Topical anesthesia was used. Anesthetic medications included Lidocaine 2%, Proparacaine 0.5%.   Procedure Preparation included 5% betadine to ocular surface, eyelid speculum. A supplied (32g) needle was used.   Injection: 1.25 mg Bevacizumab 1.25mg /0.19ml   Route: Intravitreal, Site: Right Eye   NDC: 14782-956-21, Lot: [id number] , Expiration date: 04/30/2024   Post-op Post injection exam found visual acuity of at least counting fingers. The patient tolerated the procedure well. There were no complications. The patient received written and verbal post procedure care education.      Intravitreal Injection, Pharmacologic Agent - OS - Left Eye       Time Out 04/04/2024. 8:50 AM. Confirmed correct patient, procedure, site, and patient consented.   Anesthesia Topical anesthesia was used. Anesthetic medications included Lidocaine 2%, Proparacaine 0.5%.   Procedure Preparation included 5% betadine to ocular surface, eyelid speculum. A supplied (32g) needle was used.   Injection: 1.25 mg Bevacizumab 1.25mg /0.4ml   Route: Intravitreal, Site: Left Eye   NDC: P3213405, Lot: [id number], Expiration date: 05/07/2024   Post-op Post injection exam found visual acuity of at least counting fingers. The patient tolerated the procedure well. There were no complications. The patient received written and verbal post procedure care education.   Notes             ASSESSMENT/PLAN:    ICD-10-CM   1. Moderate nonproliferative diabetic retinopathy of both eyes with macular edema associated with type 2 diabetes mellitus (HCC)  E11.3313 OCT, Retina - OU - Both Eyes    Intravitreal Injection, Pharmacologic Agent - OD - Right Eye    Intravitreal Injection, Pharmacologic Agent - OS - Left Eye    Bevacizumab (AVASTIN) SOLN 1.25 mg    Bevacizumab (AVASTIN) SOLN 1.25 mg    2. Diabetes mellitus treated with oral medication (HCC)  E11.9  Z79.84     3. Retinal hole of right eye  H33.321     4. Essential hypertension  I10     5. Hypertensive retinopathy of both eyes  H35.033     6. Pseudophakia, both eyes  Z96.1     7. Dry eyes  H04.123     8. Herpes keratitis of right eye  B00.52      1,2. Moderate non-proliferative diabetic retinopathy, OU  - delayed follow up from 8 weeks to 12.5 weeks (01.15.25 to 04.14.25)  - delayed follow up from 7 weeks to 9 weeks (09.30.24 to 11.20.24)  - s/p IVA OD #1 (09.16.24), #2 (11.20.24), #3 (01.14.25) - s/p IVA OS #1 (05.07.21), #2 (06.04.21), #3 (07.06.21), #4 (08.03.21), #5 (08.31.21), #6 (9.28.21), #7 (11.2.21), #8 (12.07.21), #9 (02.02.22), #10 (03.30.22), #11 (05.25.22), #12 (08.03.22), #13 (10.20.22), #14 (01.12.23), #15 (01.22.24), #16 (03.11.24), #17 (03.11.24), #18 (04.22.24), #19 (06.03.24), #20 (07.15.24) - s/p IVE OS #1 (08.19.24), #2 (09.16.24), #3 (11.20.24), #4 (sample -- 01.14.25)  - s/p PRP OS (04.09.21) -- good laser changes - s/p PRP OD (02.05.24) for peripheral vascular nonperfusion -- good laser changes - repeat FA (09.28.21) shows late leaking MA OU, significant capillary drop-out  - repeat FA (01.23.24) shows OD: Scattered patches of vascular non-perfusion; large area of vascular non perfusion temporally, no NV; OS: delayed filling; scattered patches of vascular non perfusion peripherally, staining of 360 laser, scattered leaking MA -- most prominent IT to fovea, no NV  - exam shows scattered MA/IRH/CWS OU -- improving; new vitreous opacities OU improved  - BCVA OD decreased to 20/70 from 20/40, OS stable at 20/30  - OCT shows mild diffuse retinal thinning -- stable, interval increase in focal cystic changes nasal fovea, persistent vitreous opacities -- slightly improved; OS: NFP, no SRF, mild interval improvement in IRF / IRHM temporal fovea and mac, +vitreous opacities -- improved at 12.5 weeks - recommend IVA OU (OD #4 and OS #21 today, 04.14.25 with f/u back to  6-8 weeks - [employer] funding unavailable at this time - RBA of procedure discussed, questions answered - see procedure note - IVA informed consent obtained and signed OD (09.16.24) - IVE informed consent obtained and signed OS (08.19.24)  - f/u 6-8 weeks, DFE, OCT, possible injxns  3. Operculated retinal hole w/ cuff of SRF / focal RD, right eye - operculated hole located at 1000 with partial pigment and +cuff of SRF / focal RD  - s/p retinopexy OD (03.17.21) -- good laser changes surrounding  - stable--monitor  4,5. Hypertensive retinopathy OU  - discussed importance of tight BP control  - monitor  6. Pseudophakia OU  - s/p CE/IOL (Dr. Marvin Slot, OD: 07.02.24)  - IOL in good position, doing well  - monitor  7. Dry eyes OU  - recommend artificial tears and lubricating ointment as needed  8 Herpes Keratitis, OD  - exam today with improved injection; no dendrites; still with +AC cell  - completed 14 days of Valtrex 1mg  PO BID - cont PF 6x/day OU for AC cell -- decreased to QID per Dr. Candi Chafe on 11.11.24-- PF tapered off - monitor  Ophthalmic Meds Ordered this visit:  Meds ordered this encounter  Medications   Bevacizumab (AVASTIN) SOLN 1.25 mg   Bevacizumab (AVASTIN) SOLN 1.25 mg     Return for f/u 6-8 weeks, NPDR OU, DFE, OCT, Possible Injxn.  There are no Patient Instructions on file for this visit.  Explained the diagnoses, plan, and follow  up with the patient and they expressed understanding.  Patient expressed understanding of the importance of proper follow up care.   This document serves as a record of services personally performed by Jeanice Millard, MD, PhD. It was created on their behalf by Morley Arabia. Bevin Bucks, OA an ophthalmic technician. The creation of this record is the provider's dictation and/or activities during the visit.    Electronically signed by: Morley Arabia. Bevin Bucks, OA 04/04/24 12:12 PM  Jeanice Millard, M.D., Ph.D. Diseases & Surgery of the Retina and  Vitreous [facility]  I have reviewed the above documentation for accuracy and completeness, and I agree with the above. Jeanice Millard, M.D., Ph.D. 04/04/24 12:13 PM    Abbreviations: M myopia (nearsighted); A astigmatism; H hyperopia (farsighted); P presbyopia; Mrx spectacle prescription;  CTL contact lenses; OD right eye; OS left eye; OU both eyes  XT exotropia; ET esotropia; PEK punctate epithelial keratitis; PEE punctate epithelial erosions; DES dry eye syndrome; MGD meibomian gland dysfunction; ATs artificial tears; PFAT's preservative free artificial tears; NSC nuclear sclerotic cataract; PSC posterior subcapsular cataract; ERM epi-retinal membrane; PVD posterior vitreous detachment; RD retinal detachment; DM diabetes mellitus; DR diabetic retinopathy; NPDR non-proliferative diabetic retinopathy; PDR proliferative diabetic retinopathy; CSME clinically significant macular edema; DME diabetic macular edema; dbh dot blot hemorrhages; CWS cotton wool spot; POAG primary open angle glaucoma; C/D cup-to-disc ratio; HVF humphrey visual field; GVF goldmann visual field; OCT optical coherence tomography; IOP intraocular pressure; BRVO Branch retinal vein occlusion; CRVO central retinal vein occlusion; CRAO central retinal artery occlusion; BRAO branch retinal artery occlusion; RT retinal tear; SB scleral buckle; PPV pars plana vitrectomy; VH Vitreous hemorrhage; PRP panretinal laser photocoagulation; IVK intravitreal kenalog; VMT vitreomacular traction; MH Macular hole;  NVD neovascularization of the disc; NVE neovascularization elsewhere; AREDS age related eye disease study; ARMD age related macular degeneration; POAG primary open angle glaucoma; EBMD epithelial/anterior basement membrane dystrophy; ACIOL anterior chamber intraocular lens; IOL intraocular lens; PCIOL posterior chamber intraocular lens; Phaco/IOL phacoemulsification with intraocular lens placement; PRK photorefractive  keratectomy; LASIK laser assisted in situ keratomileusis; HTN hypertension; DM diabetes mellitus; COPD chronic obstructive pulmonary disease

## 2024-04-01 ENCOUNTER — Other Ambulatory Visit: Payer: Self-pay | Admitting: Adult Health

## 2024-04-04 ENCOUNTER — Ambulatory Visit (INDEPENDENT_AMBULATORY_CARE_PROVIDER_SITE_OTHER): Admitting: Ophthalmology

## 2024-04-04 ENCOUNTER — Encounter (INDEPENDENT_AMBULATORY_CARE_PROVIDER_SITE_OTHER): Payer: Self-pay | Admitting: Ophthalmology

## 2024-04-04 ENCOUNTER — Other Ambulatory Visit: Payer: Self-pay | Admitting: Nurse Practitioner

## 2024-04-04 DIAGNOSIS — I10 Essential (primary) hypertension: Secondary | ICD-10-CM | POA: Diagnosis not present

## 2024-04-04 DIAGNOSIS — H33321 Round hole, right eye: Secondary | ICD-10-CM | POA: Diagnosis not present

## 2024-04-04 DIAGNOSIS — E119 Type 2 diabetes mellitus without complications: Secondary | ICD-10-CM

## 2024-04-04 DIAGNOSIS — Z7984 Long term (current) use of oral hypoglycemic drugs: Secondary | ICD-10-CM

## 2024-04-04 DIAGNOSIS — H04123 Dry eye syndrome of bilateral lacrimal glands: Secondary | ICD-10-CM

## 2024-04-04 DIAGNOSIS — E113313 Type 2 diabetes mellitus with moderate nonproliferative diabetic retinopathy with macular edema, bilateral: Secondary | ICD-10-CM

## 2024-04-04 DIAGNOSIS — B0052 Herpesviral keratitis: Secondary | ICD-10-CM

## 2024-04-04 DIAGNOSIS — Z961 Presence of intraocular lens: Secondary | ICD-10-CM

## 2024-04-04 DIAGNOSIS — H35033 Hypertensive retinopathy, bilateral: Secondary | ICD-10-CM

## 2024-04-04 MED ORDER — BEVACIZUMAB CHEMO INJECTION 1.25MG/0.05ML SYRINGE FOR KALEIDOSCOPE
1.2500 mg | INTRAVITREAL | Status: AC | PRN
Start: 2024-04-04 — End: 2024-04-04
  Administered 2024-04-04: 1.25 mg via INTRAVITREAL

## 2024-04-04 MED ORDER — BEVACIZUMAB CHEMO INJECTION 1.25MG/0.05ML SYRINGE FOR KALEIDOSCOPE
1.2500 mg | INTRAVITREAL | Status: AC | PRN
  Administered 2024-04-04: 1.25 mg via INTRAVITREAL

## 2024-04-05 ENCOUNTER — Other Ambulatory Visit: Payer: Self-pay
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
## 2024-04-06 ENCOUNTER — Ambulatory Visit (INDEPENDENT_AMBULATORY_CARE_PROVIDER_SITE_OTHER): Payer: Medicare HMO | Admitting: Adult Health

## 2024-04-06 ENCOUNTER — Encounter: Payer: Self-pay | Admitting: Adult Health

## 2024-04-06 VITALS — BP 147/84 | HR 78

## 2024-04-06 DIAGNOSIS — F01C11 Vascular dementia, severe, with agitation: Secondary | ICD-10-CM | POA: Diagnosis not present

## 2024-04-06 DIAGNOSIS — Z8673 Personal history of transient ischemic attack (TIA), and cerebral infarction without residual deficits: Secondary | ICD-10-CM

## 2024-04-06 DIAGNOSIS — I639 Cerebral infarction, unspecified: Secondary | ICD-10-CM | POA: Diagnosis not present

## 2024-04-06 MED ORDER — DONEPEZIL HCL 10 MG PO TABS: 10.0000 mg | ORAL_TABLET | Freq: Every day | ORAL | 3 refills | Status: DC

## 2024-04-06 NOTE — Patient Instructions (Addendum)
 Your Plan:  Continue Aricept, Namenda, Seroquel and trazodone at current dosages  Consider adding Abilify or Rexulti if behaviors persist - these medications can potentially interact with Seroquel resulting in serotonin syndrome as we discussed today. Please let me know if you are interested in starting one of these medications or if memory care facility would start, they can closely monitor for potential side effects/interactions and make dosage adjustments as needed.      Follow up in 6 months or call earlier if needed      Thank you for coming to see [place] at [facility]. I hope we have been able to provide you high quality care today.  You may receive a patient satisfaction survey over the next few weeks. We would appreciate your feedback and comments so that we may continue to improve ourselves and the health of our patients.    Serotonin Syndrome Serotonin is a chemical that helps to control several functions in the body. This chemical is also called a neurotransmitter. It controls: Brain and nerve cell function. Mood and emotions. Memory. Eating. Sleeping. Sexual activity. Stress response. Having too much serotonin in your body can cause serotonin syndrome. This condition can be harmful to your brain and nerve cells. This can be a life-threatening condition. What are the causes? This condition may be caused by taking medicines or drugs that increase the level of serotonin in your body, such as: Antidepressant medicines. Migraine medicines. Certain pain medicines. Certain drugs, including ecstasy, LSD, cocaine, and amphetamines. Over-the-counter cough or cold medicines that contain dextromethorphan. Certain herbal supplements, including St. John's wort, ginseng, and nutmeg. This condition usually occurs when you take these medicines or drugs together, but it can also happen with a high dose of a single medicine or drug. What increases the risk? You are  more likely to develop this condition if: You just started taking a medicine or drug that increases the level of serotonin in the body. You recently increased the dose of a medicine or drug that increases the level of serotonin in the body. You take more than one medicine or drug that increases the level of serotonin in the body. What are the signs or symptoms? Symptoms of this condition usually start within several hours of taking a medicine or drug. Symptoms may be mild or severe. Mild symptoms include: Sweating. Restlessness or agitation. Muscle twitching or stiffness. Rapid heart rate. Nausea, vomiting, or diarrhea. Shivering or goose bumps. Confusion. Severe symptoms include: Irregular heartbeat. Seizures. Loss of consciousness. High fever. How is this diagnosed? This condition may be diagnosed based on: Your medical history. A physical exam. Your prior use of drugs and medicines. Blood or urine tests. These may be used to rule out other causes of your symptoms. How is this treated? The treatment for this condition depends on the severity of your symptoms. For mild cases, stopping the medicine or drug that caused your condition is usually all that is needed. For moderate to severe cases, treatment in a hospital may be needed to prevent or treat life-threatening symptoms. Treatment may include: Medicines to control your symptoms. IV fluids. Actions to support your breathing. Treatments to control your body temperature. Follow these instructions at home: Medicines  Take over-the-counter and prescription medicines only as told by your health care provider. Check with your health care provider before you start taking any new prescriptions, over-the-counter medicines, herbs, or supplements. Do not combine any medicines that can cause this condition. Lifestyle  Maintain a healthy lifestyle.  Eat a healthy diet that includes plenty of vegetables, fruits, whole grains, low-fat  dairy products, and lean protein. Do not eat a lot of foods that are high in fat, added sugars, or salt. Get the right amount and quality of sleep. Most adults need 7-9 hours of sleep each night. Make time to exercise, even if it is only for short periods of time. Most adults should exercise for at least 150 minutes each week. Do not drink alcohol. Do not use illegal drugs. Do not take medicines for reasons other than they are prescribed. General instructions Do not use any products that contain nicotine or tobacco. These products include cigarettes, chewing tobacco, and vaping devices, such as e-cigarettes. If you need help quitting, ask your health care provider. Contact a health care provider if: Your symptoms do not improve or they get worse. Get help right away if: You have worsening confusion, severe headache, chest pain, high fever, seizures, or loss of consciousness. You experience serious side effects of medicine, such as swelling of your face, lips, tongue, or throat. These symptoms may be an emergency. Get help right away. Call 911. Do not wait to see if the symptoms will go away. Do not drive yourself to the hospital. Also, get help right away if: You have serious thoughts about hurting yourself or others. Take one of these steps if you feel like you may hurt yourself or others, or have thoughts about taking your own life: Go to your nearest emergency room. Call 911. Call the National Suicide Prevention Lifeline at 669-851-3629 or 988. This is open 24 hours a day. Text the Crisis Text Line at 306-658-1679. Summary Serotonin is a chemical that helps to control several functions in the body. High levels of serotonin in the body can cause serotonin syndrome, which can be life-threatening. This condition may be caused by taking medicines or drugs that increase the level of serotonin in your body. Treatment depends on the severity of your symptoms. For mild cases, stopping the medicine or  drug that caused your condition is usually all that is needed. Check with your health care provider before you start taking any new prescriptions, over-the-counter medicines, herbs, or supplements. This information is not intended to replace advice given to you by your health care provider. Make sure you discuss any questions you have with your health care provider. Document Revised: 02/27/2022 Document Reviewed: 02/27/2022 Elsevier Patient Education  2024 [employer].

## 2024-04-09 ENCOUNTER — Encounter: Payer: Self-pay | Admitting: Internal Medicine

## 2024-04-15 ENCOUNTER — Ambulatory Visit (INDEPENDENT_AMBULATORY_CARE_PROVIDER_SITE_OTHER): Payer: Medicare HMO

## 2024-04-15 DIAGNOSIS — R55 Syncope and collapse: Secondary | ICD-10-CM | POA: Diagnosis not present

## 2024-04-15 LAB — CUP PACEART REMOTE DEVICE CHECK
Date Time Interrogation Session: 20250424230321
Implantable Pulse Generator Implant Date: 20210201

## 2024-04-19 NOTE — Progress Notes (Signed)
 Carelink Summary Report / Loop Recorder

## 2024-04-24 ENCOUNTER — Encounter: Payer: Self-pay | Admitting: Internal Medicine

## 2024-04-25 ENCOUNTER — Encounter: Payer: Self-pay | Admitting: Adult Health

## 2024-04-25 ENCOUNTER — Ambulatory Visit: Payer: Medicare HMO

## 2024-04-25 NOTE — Telephone Encounter (Signed)
 I am not sure if this is a form usually completed by PCP? I reviewed prior OV notes and unable to find any that mentioned working vs not working. Not sure when he stopped working. Would recommend this first be discussed with PCP as they can add all comorbidities to this form.

## 2024-05-11 NOTE — Progress Notes (Signed)
 [facility] - Clinic Note  05/23/2024     CHIEF COMPLAINT Patient presents for Retina Follow Up   HISTORY OF PRESENT ILLNESS: [name] is a 73 y.o. male who presents to the clinic today for:   HPI     Retina Follow Up   Patient presents with  Diabetic Retinopathy.  In both eyes.  This started 8 weeks ago.  Duration of 8 weeks.  Since onset it is stable.  I, the attending physician,  performed the HPI with the patient and updated documentation appropriately.        Comments   8 week retina follow up NPDR OU and IVA OU pt is reporting no vision changes noticed he denies any flashes or floaters pt is unsure what last reading was he is now in a nursing facility       Last edited by Ronelle Coffee, MD on 05/23/2024 12:10 PM.     Pt states vision is stable   Referring physician: Shannan Dart., FNP [address] [place],  [place] [address]  HISTORICAL INFORMATION:   Selected notes from the MEDICAL RECORD NUMBER Referred by Dr. Diedre Fox for concern of HTN Ret   CURRENT MEDICATIONS: Current Outpatient Medications (Ophthalmic Drugs)  Medication Sig   latanoprost  (XALATAN ) 0.005 % ophthalmic solution Place 1 drop into both eyes at bedtime.   No current facility-administered medications for this visit. (Ophthalmic Drugs)   Current Outpatient Medications (Other)  Medication Sig   amLODipine  (NORVASC ) 5 MG tablet Take 1 tablet (5 mg total) by mouth daily.   aspirin  EC 81 MG tablet Take 1 tablet (81 mg total) by mouth daily. Swallow whole.   atorvastatin  (LIPITOR) 40 MG tablet Take 1 tablet (40 mg total) by mouth daily at 6 PM.   carvedilol  (COREG ) 6.25 MG tablet Take 6.25 mg by mouth 2 (two) times daily with a meal.   divalproex (DEPAKOTE SPRINKLE) 125 MG capsule Take 125 mg by mouth 2 (two) times daily.   donepezil  (ARICEPT ) 10 MG tablet Take 1 tablet (10 mg total) by mouth at bedtime.   hydrochlorothiazide  (HYDRODIURIL ) 25 MG tablet Take 25 mg by  mouth daily.   LORazepam  (ATIVAN ) 0.5 MG tablet Take 0.5 mg by mouth every 8 (eight) hours as needed.   losartan  (COZAAR ) 50 MG tablet Take 50 mg by mouth 2 (two) times daily.   memantine  (NAMENDA ) 10 MG tablet Take 1 tablet (10 mg total) by mouth 2 (two) times daily.   metFORMIN  (GLUCOPHAGE ) 500 MG tablet Take by mouth 2 (two) times daily with a meal.   Multiple Vitamins-Minerals (ONE-A-DAY MENS 50+ PO) Take 1 tablet by mouth daily.   potassium chloride  SA (KLOR-CON  M) 20 MEQ tablet Take 20 mEq by mouth daily.   QUEtiapine  (SEROQUEL ) 100 MG tablet Take 1 tablet (100 mg total) by mouth at bedtime.   silver sulfADIAZINE (SILVADENE) 1 % cream Apply 1 Application topically 2 (two) times daily.   tamsulosin  (FLOMAX ) 0.4 MG CAPS capsule Take 0.4 mg by mouth daily.   traZODone  (DESYREL ) 150 MG tablet TAKE 1 TABLET AT BEDTIME   No current facility-administered medications for this visit. (Other)   REVIEW OF SYSTEMS: ROS   Positive for: Endocrine, Eyes Negative for: Constitutional, Gastrointestinal, Neurological, Skin, Genitourinary, Musculoskeletal, HENT, Cardiovascular, Respiratory, Psychiatric, Allergic/Imm, Heme/Lymph Last edited by Alise Appl, COT on 05/23/2024  9:15 AM.      ALLERGIES Allergies  Allergen Reactions   Other Swelling and Rash    "  After shave" - Daughter to find out specific name/product and update at that time. Caused total facial swelling.   Shellfish Allergy Anaphylaxis   PAST MEDICAL HISTORY Past Medical History:  Diagnosis Date   Acute cerebrovascular accident (CVA) (HCC) 01/20/2020   Benign essential HTN 01/20/2020   Cataract    Mixed form OU   Dementia (HCC)    Diabetic retinopathy (HCC)    NPDR OU   Hypertensive retinopathy    OU   Seizures (HCC)    per pt- "none since teenage years"   Past Surgical History:  Procedure Laterality Date   LOOP RECORDER INSERTION N/A 01/23/2020   Procedure: LOOP RECORDER INSERTION;  Surgeon: Verona Goodwill, MD;   Location: [facility] INVASIVE CV LAB;  Service: Cardiovascular;  Laterality: N/A;   FAMILY HISTORY Family History  Problem Relation Age of Onset   COPD Mother    Diabetes Mother    Dementia Father    Diabetes Father    Cancer Father        prostate and lung   Colon cancer Neg Hx    Esophageal cancer Neg Hx    Stomach cancer Neg Hx    Rectal cancer Neg Hx    SOCIAL HISTORY Social History   Tobacco Use   Smoking status: Never   Smokeless tobacco: Never  Vaping Use   Vaping status: Never Used  Substance Use Topics   Alcohol  use: Never   Drug use: Never       OPHTHALMIC EXAM: Base Eye Exam     Visual Acuity (Snellen - Linear)       Right Left   Dist [place] 20/80 -2 20/30   Dist ph [place] NI NI         Tonometry (Tonopen, 9:22 AM)       Right Left   Pressure 18 18         Pupils       Pupils Dark Light Shape React APD   Right PERRL 3 2 Round Brisk None   Left PERRL 3 2 Round Brisk None         Visual Fields       Left Right    Full Full         Extraocular Movement       Right Left    Full, Ortho Full, Ortho         Neuro/Psych     Oriented x3: Yes   Mood/Affect: Normal         Dilation     Both eyes: 2.5% Phenylephrine @ 9:22 AM           Slit Lamp and Fundus Exam     Slit Lamp Exam       Right Left   Lids/Lashes Dermatochalasis - upper lid, MGD, Ptosis Dermatochalasis - upper lid   Conjunctiva/Sclera Nasal and temporal Pinguecula, Melanosis Nasal and temporal Pinguecula, mild Melanosis   Cornea Arcus, well healed cataract wound, no dendrites, pigmented KP inferiorly, 1+ fine Punctate epithelial erosions Arcus, 2+fine Punctate epithelial erosions, well healed cataract wound   Anterior Chamber deep and clear, no cell or flare deep and clear, no cell or flare   Iris Round and dilated, No NVI Round and dilated, No NVI   Lens PC IOL in good position PC IOL in good postition   Anterior Vitreous Vitreous syneresis, PVD, vitreous  condensations clearing Vitreous syneresis, Posterior vitreous detachment, fine pigment         Fundus  Exam       Right Left   Disc sharp rim, 2-3+pallor, +cupping mild Pallor, Sharp rim   C/D Ratio 0.6 0.5   Macula Flat, good foveal reflex, scattered MA / DBH greatest superior mac, trace cystic changes -- slightly improved Flat, good foveal reflex, scattered Microaneurysms/DBH, cystic changes IT macula -- slightly increased   Vessels attenuated, tortuous, focal flame heme along ST arcades -- resolved attenuated, Tortuous, Telangiectasia   Periphery Attached, operculated hole at 1000 with partial pigment and +cuff of SRF -- good laser changes surrounding, scattered MA/DBH greatest posteriorly, White without pressure temporal periphery, good peripheral PRP changes 360, scattered DBH greatest superiorly Attached, scattered DBH, focal pigmented CR scar at 0130 equator, good peripheral 360 PRP           IMAGING AND PROCEDURES  Imaging and Procedures for @TODAY @  OCT, Retina - OU - Both Eyes        Right Eye Quality was borderline. Central Foveal Thickness: 243. Progression has improved. Findings include normal foveal contour, no IRF, no SRF (mild diffuse retinal thinning -- stable, interval improvement in scattered cystic changes, persistent vitreous opacities -- slightly improved).   Left Eye Quality was borderline. Central Foveal Thickness: 268. Progression has worsened. Findings include normal foveal contour, no SRF, intraretinal hyper-reflective material, intraretinal fluid (Mild interval increase in IRF / IRHM temporal fovea and mac, +vitreous opacities -- improved).   Notes  *Images captured and stored on drive  Diagnosis / Impression:  OD: NFP, no IRF / SRF -- mild diffuse retinal thinning -- stable, interval improvement in scattered cystic changes, persistent vitreous opacities -- slightly improved OS: NFP, no SRF, mild interval increase in IRF / IRHM temporal fovea and mac,  +vitreous opacities -- improved   Clinical management:  See below  Abbreviations: NFP - Normal foveal profile. CME - cystoid macular edema. PED - pigment epithelial detachment. IRF - intraretinal fluid. SRF - subretinal fluid. EZ - ellipsoid zone. ERM - epiretinal membrane. ORA - outer retinal atrophy. ORT - outer retinal tubulation. SRHM - subretinal hyper-reflective material      Intravitreal Injection, Pharmacologic Agent - OD - Right Eye       Time Out 05/23/2024. 10:36 AM. Confirmed correct patient, procedure, site, and patient consented.   Anesthesia Topical anesthesia was used. Anesthetic medications included Lidocaine  2%, Proparacaine 0.5%.   Procedure Preparation included 5% betadine to ocular surface, eyelid speculum. A supplied (32g) needle was used.   Injection: 1.25 mg Bevacizumab  1.25mg /0.33ml   Route: Intravitreal, Site: Right Eye   NDC: H525437, Lot: [id number], Expiration date: 06/10/2024   Post-op Post injection exam found visual acuity of at least counting fingers. The patient tolerated the procedure well. There were no complications. The patient received written and verbal post procedure care education.      Intravitreal Injection, Pharmacologic Agent - OS - Left Eye        Time Out 05/23/2024. 10:36 AM. Confirmed correct patient, procedure, site, and patient consented.   Anesthesia Topical anesthesia was used. Anesthetic medications included Lidocaine  2%, Proparacaine 0.5%.   Procedure Preparation included 5% betadine to ocular surface, eyelid speculum. A (32g) needle was used.   Injection: 1.25 mg Bevacizumab  1.25mg /0.62ml   Route: Intravitreal, Site: Left Eye   NDC: H525437, Lot: [id number], Expiration date: 08/25/2024   Post-op Post injection exam found visual acuity of at least counting fingers. The patient tolerated the procedure well. There were no complications. The patient received written and verbal post procedure care education.  Notes               ASSESSMENT/PLAN:    ICD-10-CM   1. Moderate nonproliferative diabetic retinopathy of both eyes with macular edema associated with type 2 diabetes mellitus (HCC)  E11.3313 OCT, Retina - OU - Both Eyes    Intravitreal Injection, Pharmacologic Agent - OD - Right Eye    Intravitreal Injection, Pharmacologic Agent - OS - Left Eye    Bevacizumab  (AVASTIN ) SOLN 1.25 mg    Bevacizumab  (AVASTIN ) SOLN 1.25 mg    2. Diabetes mellitus treated with oral medication (HCC)  E11.9    Z79.84     3. Retinal hole of right eye  H33.321     4. Essential hypertension  I10     5. Hypertensive retinopathy of both eyes  H35.033     6. Pseudophakia, both eyes  Z96.1     7. Dry eyes  H04.123      1,2. Moderate non-proliferative diabetic retinopathy, OU  - delayed follow up from 8 weeks to 12.5 weeks (01.15.25 to 04.14.25)  - delayed follow up from 7 weeks to 9 weeks (09.30.24 to 11.20.24)  - s/p IVA OD #1 (09.16.24), #2 (11.20.24), #3 (01.14.25), #4 (04.14.25) - s/p IVA OS #1 (05.07.21), #2 (06.04.21), #3 (07.06.21), #4 (08.03.21), #5 (08.31.21), #6 (9.28.21), #7 (11.2.21), #8 (12.07.21), #9 (02.02.22), #10 (03.30.22), #11 (05.25.22), #12 (08.03.22), #13 (10.20.22), #14 (01.12.23), #15 (01.22.24), #16 (03.11.24), #17 (03.11.24), #18 (04.22.24), #19 (06.03.24), #20 (07.15.24), #21 (04.14.25) - s/p IVE OS #1 (08.19.24), #2 (09.16.24), #3 (11.20.24), #4 (sample -- 01.14.25)  - s/p PRP OS (04.09.21) -- good laser changes - s/p PRP OD (02.05.24) for peripheral vascular nonperfusion -- good laser changes - repeat FA (09.28.21) shows late leaking MA OU, significant capillary drop-out  - repeat FA (01.23.24) shows OD: Scattered patches of vascular non-perfusion; large area of vascular non perfusion temporally, no NV; OS: delayed filling; scattered patches of vascular non perfusion peripherally, staining of 360 laser, scattered leaking MA -- most prominent IT to fovea, no NV  - exam  shows scattered MA/IRH/CWS OU -- improving; new vitreous opacities OU improved  - BCVA OD decreased to 20/80 from 20/70, OS stable at 20/30  - OCT shows OD: NFP, no IRF / SRF -- mild diffuse retinal thinning -- stable, interval improvement in scattered cystic changes, persistent vitreous opacities -- slightly improved; OS: NFP, no SRF, mild interval increase in IRF / IRHM temporal fovea and mac, +vitreous opacities -- improved at 7 weeks - recommend IVA OU (OD #5 and OS #22) today, 06.02.25 with f/u at 7 weeks again - [employer] funding unavailable at this time - RBA of procedure discussed, questions answered - see procedure note - IVA informed consent obtained and signed OD (09.16.24) - IVE informed consent obtained and signed OS (08.19.24)  - f/u 7 weeks, DFE, OCT, possible injxns  3. Operculated retinal hole w/ cuff of SRF / focal RD, right eye - operculated hole located at 1000 with partial pigment and +cuff of SRF / focal RD  - s/p retinopexy OD (03.17.21) -- good laser changes surrounding  - stable -- monitor  4,5. Hypertensive retinopathy OU  - discussed importance of tight BP control  - monitor  6. Pseudophakia OU  - s/p CE/IOL (Dr. Marvin Slot, OD: 07.02.24)  - IOL in good position, doing well  - monitor  7. Dry eyes OU  - recommend artificial tears and lubricating ointment as needed  - monitor  Ophthalmic Meds  Ordered this visit:  Meds ordered this encounter  Medications   Bevacizumab  (AVASTIN ) SOLN 1.25 mg   Bevacizumab  (AVASTIN ) SOLN 1.25 mg     Return in about 7 weeks (around 07/11/2024) for f/u NPDR OU, DFE, OCT, Possible Injxn.  There are no Patient Instructions on file for this visit.  Explained the diagnoses, plan, and follow up with the patient and they expressed understanding.  Patient expressed understanding of the importance of proper follow up care.   This document serves as a record of services personally performed by Jeanice Millard, MD, PhD. It was  created on their behalf by Mayola Specking, COA an ophthalmic technician. The creation of this record is the provider's dictation and/or activities during the visit.   Electronically signed by: Carrington Clack, COT  05/23/24  12:19 PM   This document serves as a record of services personally performed by Jeanice Millard, MD, PhD. It was created on their behalf by Morley Arabia. Bevin Bucks, OA an ophthalmic technician. The creation of this record is the provider's dictation and/or activities during the visit.    Electronically signed by: Morley Arabia. Bevin Bucks, OA 05/23/24 12:19 PM  Jeanice Millard, M.D., Ph.D. Diseases & Surgery of the Retina and Vitreous [facility]  I have reviewed the above documentation for accuracy and completeness, and I agree with the above. Jeanice Millard, M.D., Ph.D. 05/23/24 12:19 PM   Abbreviations: M myopia (nearsighted); A astigmatism; H hyperopia (farsighted); P presbyopia; Mrx spectacle prescription;  CTL contact lenses; OD right eye; OS left eye; OU both eyes  XT exotropia; ET esotropia; PEK punctate epithelial keratitis; PEE punctate epithelial erosions; DES dry eye syndrome; MGD meibomian gland dysfunction; ATs artificial tears; PFAT's preservative free artificial tears; NSC nuclear sclerotic cataract; PSC posterior subcapsular cataract; ERM epi-retinal membrane; PVD posterior vitreous detachment; RD retinal detachment; DM diabetes mellitus; DR diabetic retinopathy; NPDR non-proliferative diabetic retinopathy; PDR proliferative diabetic retinopathy; CSME clinically significant macular edema; DME diabetic macular edema; dbh dot blot hemorrhages; CWS cotton wool spot; POAG primary open angle glaucoma; C/D cup-to-disc ratio; HVF humphrey visual field; GVF goldmann visual field; OCT optical coherence tomography; IOP intraocular pressure; BRVO Branch retinal vein occlusion; CRVO central retinal vein occlusion; CRAO central retinal artery occlusion; BRAO branch  retinal artery occlusion; RT retinal tear; SB scleral buckle; PPV pars plana vitrectomy; VH Vitreous hemorrhage; PRP panretinal laser photocoagulation; IVK intravitreal kenalog; VMT vitreomacular traction; MH Macular hole;  NVD neovascularization of the disc; NVE neovascularization elsewhere; AREDS age related eye disease study; ARMD age related macular degeneration; POAG primary open angle glaucoma; EBMD epithelial/anterior basement membrane dystrophy; ACIOL anterior chamber intraocular lens; IOL intraocular lens; PCIOL posterior chamber intraocular lens; Phaco/IOL phacoemulsification with intraocular lens placement; PRK photorefractive keratectomy; LASIK laser assisted in situ keratomileusis; HTN hypertension; DM diabetes mellitus; COPD chronic obstructive pulmonary disease

## 2024-05-13 ENCOUNTER — Emergency Department (HOSPITAL_COMMUNITY)
Admission: EM | Admit: 2024-05-13 | Discharge: 2024-05-13 | Disposition: A | Discharge status: Home / Self Care | Attending: Emergency Medicine | Admitting: Emergency Medicine

## 2024-05-13 ENCOUNTER — Other Ambulatory Visit: Payer: Self-pay

## 2024-05-13 DIAGNOSIS — R21 Rash and other nonspecific skin eruption: Secondary | ICD-10-CM | POA: Insufficient documentation

## 2024-05-13 DIAGNOSIS — Z7982 Long term (current) use of aspirin: Secondary | ICD-10-CM | POA: Insufficient documentation

## 2024-05-13 MED ORDER — PREDNISONE 20 MG PO TABS: ORAL_TABLET | ORAL | 0 refills | Status: DC

## 2024-05-13 MED ORDER — PREDNISONE 20 MG PO TABS
60.0000 mg | ORAL_TABLET | Freq: Once | ORAL | Status: AC
  Administered 2024-05-13: 60 mg via ORAL
  Filled 2024-05-13: qty 3

## 2024-05-13 NOTE — Discharge Instructions (Signed)
 I think most likely is in the skull contact dermatitis.  Typically steroids help with this.  I would have you not put anything on the area except for soap and water and if you want to try to cover it up with a dry dressing.  Please return to the Emergency Department for rapid spreading or if you develop a fever.  Please follow-up with family doctor in the office.  If this persist despite steroids they may want to send you to a dermatologist.

## 2024-05-13 NOTE — ED Provider Notes (Signed)
 [name] EMERGENCY DEPARTMENT AT [facility] Provider Note   CSN: [id number] Arrival date & time: 05/13/24  1426     History  Chief Complaint  Patient presents with   Rash    Pt arrives via ems from memory care unit for skin weeping on R side of face that started this morning. Area is swollen and has clear liquid coming from it in a large amount. Pt denies pain. Per ems, pts face was shaved this morning, and since, has become swollen and weeping. Is allergic to shellfish, 190/98, given oral benadryl     [name] is a 73 y.o. male.  73 yo M with a chief complaints of a rash to bilateral cheeks.  He says it just started today.  Denies any new things being applied to it.  No fevers.   Rash      Home Medications Prior to Admission medications   Medication Sig Start Date End Date Taking? Authorizing Provider  predniSONE  (DELTASONE ) 20 MG tablet 3 tabs po daily x 3 days, then 2 tabs x 3 days, then 1.5 tabs x 3 days, then 1 tab x 3 days, then 0.5 tabs x 3 days 05/13/24  Yes Teona Vargus, DO  amLODipine  (NORVASC ) 5 MG tablet Take 1 tablet (5 mg total) by mouth daily. 03/09/24 04/08/24  Read Camel, MD  aspirin  EC 81 MG tablet Take 1 tablet (81 mg total) by mouth daily. Swallow whole. 03/09/24 03/09/25  Tiajuana Fluke, MD  atorvastatin  (LIPITOR) 40 MG tablet Take 1 tablet (40 mg total) by mouth daily at 6 PM. 01/23/20   Daren Eck, DO  carvedilol  (COREG ) 6.25 MG tablet Take 6.25 mg by mouth 2 (two) times daily with a meal.    [provider]  donepezil  (ARICEPT ) 10 MG tablet Take 1 tablet (10 mg total) by mouth at bedtime. 04/06/24   Johny Nap, NP  latanoprost  (XALATAN ) 0.005 % ophthalmic solution Place 1 drop into both eyes at bedtime.    [provider]  memantine  (NAMENDA ) 10 MG tablet Take 1 tablet (10 mg total) by mouth 2 (two) times daily. 11/16/23   Johny Nap, NP  metFORMIN  (GLUCOPHAGE ) 500 MG tablet Take by mouth 2 (two) times  daily with a meal.    [provider]  Multiple Vitamins-Minerals (ONE-A-DAY MENS 50+ PO) Take 1 tablet by mouth daily.    [provider]  QUEtiapine  (SEROQUEL ) 100 MG tablet Take 1 tablet (100 mg total) by mouth at bedtime. 03/08/24 04/07/24  Read Camel, MD  tamsulosin  (FLOMAX ) 0.4 MG CAPS capsule Take 0.4 mg by mouth daily. 02/16/24   [provider]  traZODone  (DESYREL ) 150 MG tablet TAKE 1 TABLET AT BEDTIME 04/05/24   Johny Nap, NP      Allergies    Shellfish allergy    Review of Systems   Review of Systems  Skin:  Positive for rash.    Physical Exam Updated Vital Signs There were no vitals taken for this visit. Physical Exam Vitals and nursing note reviewed.  Constitutional:      Appearance: He is well-developed.  HENT:     Head: Normocephalic and atraumatic.     Comments: Erythematous rash to bilateral cheeks with some honey colored crusting.  There is something overlying it which he is not sure exactly what it was.  Tells me he thinks it was put soap and water on it. Eyes:     Pupils: Pupils are equal, round, and reactive to light.  Neck:     Vascular: No JVD.  Cardiovascular:     Rate and Rhythm: Normal rate and regular rhythm.     Heart sounds: No murmur heard.    No friction rub. No gallop.  Pulmonary:     Effort: No respiratory distress.     Breath sounds: No wheezing.  Abdominal:     General: There is no distension.     Tenderness: There is no abdominal tenderness. There is no guarding or rebound.  Musculoskeletal:        General: Normal range of motion.     Cervical back: Normal range of motion and neck supple.  Skin:    Coloration: Skin is not pale.     Findings: No rash.  Neurological:     Mental Status: He is alert and oriented to person, place, and time.  Psychiatric:        Behavior: Behavior normal.     ED Results / Procedures / Treatments   Labs (all labs ordered are listed, but only abnormal results are  displayed) Labs Reviewed - No data to display  EKG None  Radiology No results found.  Procedures Procedures    Medications Ordered in ED Medications  predniSONE  (DELTASONE ) tablet 60 mg (has no administration in time range)    ED Course/ Medical Decision Making/ A&P                                 Medical Decision Making Risk Prescription drug management.   73 yo M with a chief complaints of a rash to his cheeks bilaterally.  This has been going on reportedly just since this morning.  Patient denies rash anywhere else.  Patient is demented at baseline and has trouble providing much history.  Patient's daughter has arrived and unfortunately does not know much more of the history.  She was told by the nursing facility that perhaps a new thing was applied to his face for shaving.  This was reportedly done yesterday.  Perhaps the patient had a hair removal agent applied that caused a versus the patient has contact dermatitis.  Will start him on oral steroids.  Have him follow-up with his family doctor in the office.  3:51 PM:  I have discussed the diagnosis/risks/treatment options with the patient.  Evaluation and diagnostic testing in the emergency department does not suggest an emergent condition requiring admission or immediate intervention beyond what has been performed at this time.  They will follow up with PCP. We also discussed returning to the ED immediately if new or worsening sx occur. We discussed the sx which are most concerning (e.g., sudden worsening pain, fever, inability to tolerate by mouth) that necessitate immediate return. Medications administered to the patient during their visit and any new prescriptions provided to the patient are listed below.  Medications given during this visit Medications  predniSONE  (DELTASONE ) tablet 60 mg (has no administration in time range)     The patient appears reasonably screen and/or stabilized for discharge and I doubt any  other medical condition or other [facility] requiring further screening, evaluation, or treatment in the ED at this time prior to discharge.          Final Clinical Impression(s) / ED Diagnoses Final diagnoses:  Facial rash    Rx / DC Orders ED Discharge Orders          Ordered    predniSONE  (DELTASONE ) 20 MG  tablet        05/13/24 1550              Albertus Hughs, DO 05/13/24 1551

## 2024-05-13 NOTE — ED Notes (Signed)
 Pt is a&ox1 at [facility] and here

## 2024-05-13 NOTE — ED Notes (Signed)
 Pt daughter at bedside

## 2024-05-14 ENCOUNTER — Ambulatory Visit: Admission: EM | Admit: 2024-05-14 | Discharge: 2024-05-14 | Disposition: A | Discharge status: Home / Self Care

## 2024-05-14 DIAGNOSIS — L232 Allergic contact dermatitis due to cosmetics: Secondary | ICD-10-CM | POA: Diagnosis not present

## 2024-05-14 DIAGNOSIS — I10 Essential (primary) hypertension: Secondary | ICD-10-CM | POA: Insufficient documentation

## 2024-05-14 DIAGNOSIS — E785 Hyperlipidemia, unspecified: Secondary | ICD-10-CM | POA: Insufficient documentation

## 2024-05-14 MED ORDER — DOXYCYCLINE HYCLATE 100 MG PO CAPS: 100.0000 mg | ORAL_CAPSULE | Freq: Two times a day (BID) | ORAL | 0 refills | Status: AC

## 2024-05-14 MED ORDER — PREDNISONE 10 MG PO TABS: ORAL_TABLET | ORAL | 0 refills | Status: AC

## 2024-05-14 NOTE — ED Triage Notes (Signed)
 Here with Daughter. "He is in a facility, they put after shave on him recently after shaving him and he had a reaction, he was seen in ED and given Prednisone  but I think his face is now infected and not improving". "Also, his prednisone  Rx (dose) needs clarification for facility to give it as in how many mg per day per dose not just quantity".

## 2024-05-14 NOTE — ED Provider Notes (Signed)
 [facility]    CSN: [id number] Arrival date & time: 05/14/24  1222      History   Chief Complaint Chief Complaint  Patient presents with   Skin Problem    HPI [name] is a 73 y.o. male.   73 year old male who is brought to urgent care by his daughter from a nursing facility secondary to a significant rash on his face after getting aftershave.  This happened yesterday.  He was seen in the emergency department yesterday and treated with a a dose of 60 mg of prednisone  and then sent home with a prescription for this.  The daughter has brought him in today because the drainage on his face seems to be worsening they were concerned for infection also they need further clarification on the prescription that was sent into the pharmacy as the facility will not cut the pills as directed.  He denies any fevers or chills.  He is having no swelling in the airway, tongue or difficulty breathing.     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   Memory change 11/20/2020   Pain in joint of right knee 11/20/2020   Pain in joint of left knee 11/20/2020   Acute cerebrovascular accident (CVA) (HCC) 01/20/2020   Benign essential hypertension 01/20/2020   Stroke (HCC) 01/20/2020   Left homonymous hemianopsia 12/23/2019   Type II diabetes mellitus (HCC) 09/26/2014    Past Surgical History:  Procedure Laterality Date   LOOP RECORDER INSERTION N/A 01/23/2020   Procedure: LOOP RECORDER INSERTION;  Surgeon: Verona Goodwill, MD;   Location: [facility] INVASIVE CV LAB;  Service: Cardiovascular;  Laterality: N/A;       Home Medications    Prior to Admission medications   Medication Sig Start Date End Date Taking? Authorizing Provider  amLODipine  (NORVASC ) 5 MG tablet Take 1 tablet (5 mg total) by mouth daily. 03/09/24 05/14/24 Yes Agbata, Tochukwu, MD  aspirin  EC 81 MG tablet Take 1 tablet (81 mg total) by mouth daily. Swallow whole. 03/09/24 03/09/25 Yes Sreenath, Daymon Evans, MD  atorvastatin  (LIPITOR) 40 MG tablet Take 1 tablet (40 mg total) by mouth daily at 6 PM. 01/23/20  Yes Daren Eck, DO  carvedilol  (COREG ) 6.25 MG tablet Take 6.25 mg by mouth 2 (two) times daily with a meal.   Yes [provider]  divalproex (DEPAKOTE SPRINKLE) 125 MG capsule Take 125 mg by mouth 2 (two) times daily. 03/29/24  Yes [provider]  donepezil  (ARICEPT ) 10 MG tablet Take 1 tablet (10 mg total) by mouth at bedtime. 04/06/24  Yes McCue, Camilo Cella, NP  hydrochlorothiazide  (HYDRODIURIL ) 25 MG tablet Take 25 mg by mouth daily. 03/21/24  Yes [provider]  latanoprost  (XALATAN ) 0.005 % ophthalmic solution Place 1 drop into both eyes at bedtime.   Yes [provider]  LORazepam  (ATIVAN ) 0.5 MG tablet Take 0.5 mg by mouth every 8 (eight) hours as needed. 03/25/24  Yes [provider]  losartan  (COZAAR ) 50 MG tablet Take 50 mg by mouth 2 (two) times daily. 03/21/24  Yes [provider]  memantine  (NAMENDA ) 10 MG tablet Take 1 tablet (10 mg total) by mouth 2 (two) times daily. 11/16/23  Yes McCue, Camilo Cella, NP  metFORMIN  (GLUCOPHAGE ) 500 MG tablet Take by mouth 2 (two) times daily with a meal.   Yes [provider]  Multiple Vitamins-Minerals (ONE-A-DAY MENS 50+ PO) Take 1 tablet by mouth daily.   Yes [provider]  potassium chloride  SA (KLOR-CON  M) 20 MEQ tablet Take 20 mEq by mouth daily. 04/12/24  Yes [provider]  predniSONE  (DELTASONE ) 20 MG tablet 3 tabs po daily x 3  days, then 2 tabs x 3 days, then 1.5 tabs x 3 days, then 1 tab x 3 days, then 0.5 tabs x 3 days 05/13/24  Yes Floyd, Dan, DO  QUEtiapine  (SEROQUEL ) 100 MG tablet Take 1 tablet (100 mg total) by mouth at bedtime. 03/08/24 05/14/24 Yes Agbata, Tochukwu, MD  silver sulfADIAZINE (SILVADENE) 1 % cream Apply 1 Application topically 2 (two) times daily. 03/25/24  Yes [provider]  tamsulosin  (FLOMAX ) 0.4 MG CAPS capsule Take 0.4 mg by mouth daily. 02/16/24  Yes [provider]  traZODone  (DESYREL ) 150 MG tablet TAKE 1 TABLET AT BEDTIME 04/05/24  Yes Johny Nap, NP    Family History Family History  Problem Relation Age of Onset   COPD Mother    Diabetes Mother    Dementia Father    Diabetes Father    Cancer Father        prostate and lung   Colon cancer Neg Hx    Esophageal cancer Neg Hx    Stomach cancer Neg Hx    Rectal cancer Neg Hx     Social History Social History   Tobacco Use   Smoking status: Never   Smokeless tobacco: Never  Vaping Use   Vaping status: Never Used  Substance Use Topics   Alcohol  use: Never   Drug use: Never     Allergies   Other and Shellfish allergy   Review of Systems Review of Systems  Constitutional:  Negative for chills and fever.  HENT:  Negative for ear pain and sore throat.   Eyes:  Negative for pain and visual disturbance.  Respiratory:  Negative for cough and shortness of breath.   Cardiovascular:  Negative for chest pain and palpitations.  Gastrointestinal:  Negative for abdominal pain and vomiting.  Genitourinary:  Negative for dysuria and hematuria.  Musculoskeletal:  Negative for arthralgias and back pain.  Skin:  Negative for color change and rash.  Neurological:  Negative for seizures and syncope.  All other systems reviewed and are negative.    Physical Exam Triage Vital Signs ED Triage Vitals  Encounter Vitals Group     BP 05/14/24 1240 (!) 171/77     Systolic BP Percentile --      Diastolic BP  Percentile --      Pulse Rate 05/14/24 1240 82     Resp 05/14/24 1240 18     Temp 05/14/24 1240 99.2 F (37.3 C)     Temp Source 05/14/24 1240 Oral     SpO2 05/14/24 1240 97 %     Weight 05/14/24 1235 190 lb (86.2 kg)     Height 05/14/24 1235 5\' 7"  (1.702 m)     Head Circumference --      Peak Flow --      Pain Score 05/14/24  1231 0     Pain Loc --      Pain Education --      Exclude from Growth Chart --    No data found.  Updated Vital Signs BP (!) 171/77 (BP Location: Left Arm)   Pulse 82   Temp 99.2 F (37.3 C) (Oral)   Resp 18   Ht 5\' 7"  (1.702 m)   Wt 190 lb (86.2 kg)   SpO2 97%   BMI 29.76 kg/m   Visual Acuity Right Eye Distance:   Left Eye Distance:   Bilateral Distance:    Right Eye Near:   Left Eye Near:    Bilateral Near:     Physical Exam Vitals and nursing note reviewed.  Constitutional:      General: He is not in acute distress.    Appearance: He is well-developed.  HENT:     Head: Normocephalic and atraumatic.  Eyes:     Conjunctiva/sclera: Conjunctivae normal.  Cardiovascular:     Rate and Rhythm: Normal rate and regular rhythm.     Heart sounds: No murmur heard. Pulmonary:     Effort: Pulmonary effort is normal. No respiratory distress.     Breath sounds: Normal breath sounds.  Abdominal:     Palpations: Abdomen is soft.     Tenderness: There is no abdominal tenderness.  Musculoskeletal:        General: No swelling.     Cervical back: Neck supple.  Skin:    General: Skin is warm and dry.     Capillary Refill: Capillary refill takes less than 2 seconds.     Comments: Diffuse rash on the face bilaterally and on the chin, there is crusting on the face and chin as well.  There is no airway swelling.  Neurological:     Mental Status: He is alert.  Psychiatric:        Mood and Affect: Mood normal.      UC Treatments / Results  Labs (all labs ordered are listed, but only abnormal results are displayed) Labs Reviewed - No data to  display  EKG   Radiology No results found.  Procedures Procedures (including critical care time)  Medications Ordered in UC Medications - No data to display  Initial Impression / Assessment and Plan / UC Course  I have reviewed the triage vital signs and the nursing notes.  Pertinent labs & imaging results that were available during my care of the patient were reviewed by me and considered in my medical decision making (see chart for details).     Allergic contact dermatitis due to cosmetics   Superficial infection of the skin of the face after allergic reaction to after shave.  There is no concerning findings of difficulty breathing or swelling of the airway.  We will treat this with the following:  Doxycycline 100 mg twice daily for 7 days. Take this with food.  Discontinue previous prescription of prednisone  due to issues with the instructions at the facility and the facility relates they will not cut pills in half therefore we will start a 10 mg taper to avoid having to cut pills.  Start a Steroid Taper as follows: Day 1: 60 mg, day 2: 50 mg, day 3: 40 mg, day 4: 30 mg, day 5: 20 mg, day 6: 10 mg Wash the area with mild soap and water daily Return to urgent care or PCP if symptoms worsen or fail to resolve.    Final Clinical Impressions(s) /  UC Diagnoses   Final diagnoses:  None   Discharge Instructions      Superficial infection of the skin of the face after allergic reaction to after shave. We will treat this with the following:  Doxycycline 100 mg twice daily for 7 days. Take this with food.  Clarification of the Emergency department prescription for Prednisone   ED Prescriptions   None    PDMP not reviewed this encounter.   Kreg Pesa, PA-C 05/14/24 1418

## 2024-05-14 NOTE — Discharge Instructions (Addendum)
 Superficial infection of the skin of the face after allergic reaction to after shave. We will treat this with the following:  Doxycycline 100 mg twice daily for 7 days. Take this with food.  Discontinue previous prescription of prednisone  due to issues with the instructions at the facility.  Start a Steroid Taper as follows: Day 1: 60 mg, day 2: 50 mg, day 3: 40 mg, day 4: 30 mg, day 5: 20 mg, day 6: 10 mg Wash the area with mild soap and water daily Return to urgent care or PCP if symptoms worsen or fail to resolve.

## 2024-05-17 NOTE — ED Notes (Signed)
 05/17/24 1040 Opened chart at request of [facility] to clarify order. Able to clarify and answer question

## 2024-05-20 ENCOUNTER — Ambulatory Visit (INDEPENDENT_AMBULATORY_CARE_PROVIDER_SITE_OTHER): Payer: Medicare HMO

## 2024-05-20 DIAGNOSIS — R55 Syncope and collapse: Secondary | ICD-10-CM | POA: Diagnosis not present

## 2024-05-20 LAB — CUP PACEART REMOTE DEVICE CHECK
Date Time Interrogation Session: 20250529231442
Implantable Pulse Generator Implant Date: 20210201

## 2024-05-23 ENCOUNTER — Encounter (INDEPENDENT_AMBULATORY_CARE_PROVIDER_SITE_OTHER): Payer: Self-pay | Admitting: Ophthalmology

## 2024-05-23 ENCOUNTER — Ambulatory Visit (INDEPENDENT_AMBULATORY_CARE_PROVIDER_SITE_OTHER): Admitting: Ophthalmology

## 2024-05-23 DIAGNOSIS — B0052 Herpesviral keratitis: Secondary | ICD-10-CM

## 2024-05-23 DIAGNOSIS — I10 Essential (primary) hypertension: Secondary | ICD-10-CM

## 2024-05-23 DIAGNOSIS — H33321 Round hole, right eye: Secondary | ICD-10-CM | POA: Diagnosis not present

## 2024-05-23 DIAGNOSIS — E113313 Type 2 diabetes mellitus with moderate nonproliferative diabetic retinopathy with macular edema, bilateral: Secondary | ICD-10-CM | POA: Diagnosis not present

## 2024-05-23 DIAGNOSIS — Z7984 Long term (current) use of oral hypoglycemic drugs: Secondary | ICD-10-CM | POA: Diagnosis not present

## 2024-05-23 DIAGNOSIS — H35033 Hypertensive retinopathy, bilateral: Secondary | ICD-10-CM | POA: Diagnosis not present

## 2024-05-23 DIAGNOSIS — Z961 Presence of intraocular lens: Secondary | ICD-10-CM

## 2024-05-23 DIAGNOSIS — E119 Type 2 diabetes mellitus without complications: Secondary | ICD-10-CM

## 2024-05-23 DIAGNOSIS — H04123 Dry eye syndrome of bilateral lacrimal glands: Secondary | ICD-10-CM

## 2024-05-23 MED ORDER — BEVACIZUMAB CHEMO INJECTION 1.25MG/0.05ML SYRINGE FOR KALEIDOSCOPE
1.2500 mg | INTRAVITREAL | Status: AC | PRN
Start: 2024-05-23 — End: 2024-05-23
  Administered 2024-05-23: 1.25 mg via INTRAVITREAL

## 2024-05-23 MED ORDER — BEVACIZUMAB CHEMO INJECTION 1.25MG/0.05ML SYRINGE FOR KALEIDOSCOPE
1.2500 mg | INTRAVITREAL | Status: AC | PRN
  Administered 2024-05-23: 1.25 mg via INTRAVITREAL

## 2024-05-23 NOTE — Progress Notes (Signed)
 Carelink Summary Report / Loop Recorder

## 2024-05-29 ENCOUNTER — Ambulatory Visit: Payer: Self-pay | Admitting: Cardiology

## 2024-05-30 ENCOUNTER — Ambulatory Visit: Payer: Medicare HMO

## 2024-06-20 ENCOUNTER — Ambulatory Visit

## 2024-06-20 DIAGNOSIS — R55 Syncope and collapse: Secondary | ICD-10-CM

## 2024-06-20 LAB — CUP PACEART REMOTE DEVICE CHECK
Date Time Interrogation Session: 20250629230333
Implantable Pulse Generator Implant Date: 20210201

## 2024-06-22 ENCOUNTER — Ambulatory Visit: Payer: Self-pay | Admitting: Cardiology

## 2024-06-27 ENCOUNTER — Ambulatory Visit: Payer: Medicare HMO

## 2024-06-30 NOTE — Progress Notes (Signed)
 [facility] - Clinic Note  07/11/2024     CHIEF COMPLAINT Patient presents for Retina Follow Up   HISTORY OF PRESENT ILLNESS: [name] is a 73 y.o. male who presents to the clinic today for:   HPI     Retina Follow Up   Patient presents with  Diabetic Retinopathy.  In both eyes.  This started 7 weeks ago.  I, the attending physician,  performed the HPI with the patient and updated documentation appropriately.        Comments   Patient here for 7 weeks retina follow up for NPDR OU. Patient states vision doing fine. No eye pain.       Last edited by Valdemar Rogue, MD on 07/11/2024  1:07 PM.      Pt states he is doing fine.    Referring physician: Tilford Burden, PA-C [address] [place],  [place] [address]  HISTORICAL INFORMATION:   Selected notes from the MEDICAL RECORD NUMBER Referred by Dr. Glendia Gaudy for concern of HTN Ret   CURRENT MEDICATIONS: Current Outpatient Medications (Ophthalmic Drugs)  Medication Sig   latanoprost  (XALATAN ) 0.005 % ophthalmic solution Place 1 drop into both eyes at bedtime.   No current facility-administered medications for this visit. (Ophthalmic Drugs)   Current Outpatient Medications (Other)  Medication Sig   aspirin  EC 81 MG tablet Take 1 tablet (81 mg total) by mouth daily. Swallow whole.   atorvastatin  (LIPITOR) 40 MG tablet Take 1 tablet (40 mg total) by mouth daily at 6 PM.   carvedilol  (COREG ) 6.25 MG tablet Take 6.25 mg by mouth 2 (two) times daily with a meal.   divalproex (DEPAKOTE SPRINKLE) 125 MG capsule Take 125 mg by mouth 2 (two) times daily.   donepezil  (ARICEPT ) 10 MG tablet Take 1 tablet (10 mg total) by mouth at bedtime.   hydrochlorothiazide  (HYDRODIURIL ) 25 MG tablet Take 25 mg by mouth daily.   LORazepam  (ATIVAN ) 0.5 MG tablet Take 0.5 mg by mouth every 8 (eight) hours as needed.   losartan  (COZAAR ) 50 MG tablet Take 50 mg by mouth 2 (two) times daily.   memantine   (NAMENDA ) 10 MG tablet Take 1 tablet (10 mg total) by mouth 2 (two) times daily.   metFORMIN  (GLUCOPHAGE ) 500 MG tablet Take by mouth 2 (two) times daily with a meal.   Multiple Vitamins-Minerals (ONE-A-DAY MENS 50+ PO) Take 1 tablet by mouth daily.   potassium chloride  SA (KLOR-CON  M) 20 MEQ tablet Take 20 mEq by mouth daily.   silver sulfADIAZINE (SILVADENE) 1 % cream Apply 1 Application topically 2 (two) times daily.   tamsulosin  (FLOMAX ) 0.4 MG CAPS capsule Take 0.4 mg by mouth daily.   traZODone  (DESYREL ) 150 MG tablet TAKE 1 TABLET AT BEDTIME   amLODipine  (NORVASC ) 5 MG tablet Take 1 tablet (5 mg total) by mouth daily.   QUEtiapine  (SEROQUEL ) 100 MG tablet Take 1 tablet (100 mg total) by mouth at bedtime.   No current facility-administered medications for this visit. (Other)   REVIEW OF SYSTEMS: ROS   Positive for: Endocrine, Eyes Negative for: Constitutional, Gastrointestinal, Neurological, Skin, Genitourinary, Musculoskeletal, HENT, Cardiovascular, Respiratory, Psychiatric, Allergic/Imm, Heme/Lymph Last edited by Orval Asberry RAMAN, COA on 07/11/2024  9:52 AM.       ALLERGIES Allergies  Allergen Reactions   Other Swelling and Rash    After shave - Daughter to find out specific name/product and update at that time. Caused total facial swelling.   Shellfish Allergy Anaphylaxis  PAST MEDICAL HISTORY Past Medical History:  Diagnosis Date   Acute cerebrovascular accident (CVA) (HCC) 01/20/2020   Benign essential HTN 01/20/2020   Cataract    Mixed form OU   Dementia (HCC)    Diabetic retinopathy (HCC)    NPDR OU   Hypertensive retinopathy    OU   Seizures (HCC)    per pt- none since teenage years   Past Surgical History:  Procedure Laterality Date   LOOP RECORDER INSERTION N/A 01/23/2020   Procedure: LOOP RECORDER INSERTION;  Surgeon: Fernande Elspeth BROCKS, MD;  Location: [facility] INVASIVE CV LAB;  Service: Cardiovascular;  Laterality: N/A;   FAMILY HISTORY Family History   Problem Relation Age of Onset   COPD Mother    Diabetes Mother    Dementia Father    Diabetes Father    Cancer Father        prostate and lung   Colon cancer Neg Hx    Esophageal cancer Neg Hx    Stomach cancer Neg Hx    Rectal cancer Neg Hx    SOCIAL HISTORY Social History   Tobacco Use   Smoking status: Never   Smokeless tobacco: Never  Vaping Use   Vaping status: Never Used  Substance Use Topics   Alcohol  use: Never   Drug use: Never       OPHTHALMIC EXAM: Base Eye Exam     Visual Acuity (Snellen - Linear)       Right Left   Dist  20/70 -1 20/30         Tonometry (Tonopen, 9:50 AM)       Right Left   Pressure 16 14         Pupils       Dark Light Shape React APD   Right 3 2 Round Brisk None   Left 3 2 Round Brisk None         Visual Fields (Counting fingers)       Left Right    Full Full         Extraocular Movement       Right Left    Full, Ortho Full, Ortho         Neuro/Psych     Oriented x3: Yes   Mood/Affect: Normal         Dilation     Both eyes: 1.0% Mydriacyl, 2.5% Phenylephrine @ 9:50 AM           Slit Lamp and Fundus Exam     Slit Lamp Exam       Right Left   Lids/Lashes Dermatochalasis - upper lid, MGD, Ptosis Dermatochalasis - upper lid   Conjunctiva/Sclera Nasal and temporal Pinguecula, Melanosis Nasal and temporal Pinguecula, mild Melanosis   Cornea Arcus, well healed cataract wound, no dendrites, pigmented KP inferiorly, 1+ fine Punctate epithelial erosions Arcus, 2+fine Punctate epithelial erosions, well healed cataract wound   Anterior Chamber deep and clear, no cell or flare deep and clear, no cell or flare   Iris Round and dilated, No NVI Round and dilated, No NVI   Lens PC IOL in good position PC IOL in good postition   Anterior Vitreous Vitreous syneresis, PVD, vitreous condensations clearing Vitreous syneresis, Posterior vitreous detachment, fine pigment         Fundus Exam        Right Left   Disc sharp rim, 2-3+pallor, +cupping mild Pallor, Sharp rim   C/D Ratio 0.6 0.5   Macula Flat, good  foveal reflex, scattered MA / DBH greatest superior mac, trace cystic changes -- slightly improved Flat, good foveal reflex, scattered Microaneurysms/DBH, cystic changes IT macula   Vessels attenuated, tortuous attenuated, Tortuous, Telangiectasia   Periphery Attached, operculated hole at 1000 with partial pigment and +cuff of SRF -- good laser changes surrounding, scattered MA/DBH greatest posteriorly, White without pressure temporal periphery, good peripheral PRP changes 360, scattered DBH greatest superiorly Attached, scattered DBH, focal pigmented CR scar at 0130 equator, good peripheral 360 PRP           IMAGING AND PROCEDURES  Imaging and Procedures for @TODAY @  OCT, Retina - OU - Both Eyes       Right Eye Quality was poor. Central Foveal Thickness: 190. Progression has improved. Findings include normal foveal contour, no IRF, no SRF (mild diffuse retinal thinning -- stable, mild interval improvement in scattered cystic changes, persistent vitreous opacities ).   Left Eye Quality was poor. Central Foveal Thickness: 268. Progression has been stable. Findings include normal foveal contour, no SRF, intraretinal hyper-reflective material, intraretinal fluid (Persistent IRF / IRHM temporal fovea and mac, +vitreous opacities -- improved).   Notes *Images captured and stored on drive  Diagnosis / Impression:  OD: NFP, no IRF / SRF -- mild diffuse retinal thinning -- stable, mild interval improvement in scattered cystic changes, persistent vitreous opacities  OS: NFP, no SRF, Persistent IRF / IRHM temporal fovea and mac, +vitreous opacities -- improved   Clinical management:  See below  Abbreviations: NFP - Normal foveal profile. CME - cystoid macular edema. PED - pigment epithelial detachment. IRF - intraretinal fluid. SRF - subretinal fluid. EZ - ellipsoid zone. ERM -  epiretinal membrane. ORA - outer retinal atrophy. ORT - outer retinal tubulation. SRHM - subretinal hyper-reflective material      Intravitreal Injection, Pharmacologic Agent - OD - Right Eye       Time Out 07/11/2024. 11:06 AM. Confirmed correct patient, procedure, site, and patient consented.   Anesthesia Topical anesthesia was used. Anesthetic medications included Lidocaine  2%, Proparacaine 0.5%.   Procedure Preparation included 5% betadine to ocular surface, eyelid speculum. A supplied (32g) needle was used.   Injection: 1.25 mg Bevacizumab  1.25mg /0.70ml   Route: Intravitreal, Site: Right Eye   NDC: H525437, Lot: [id number], Expiration date: 08/07/2024   Post-op Post injection exam found visual acuity of at least counting fingers. The patient tolerated the procedure well. There were no complications. The patient received written and verbal post procedure care education.      Intravitreal Injection, Pharmacologic Agent - OS - Left Eye       Time Out 07/11/2024. 11:06 AM. Confirmed correct patient, procedure, site, and patient consented.   Anesthesia Topical anesthesia was used. Anesthetic medications included Lidocaine  2%, Proparacaine 0.5%.   Procedure Preparation included 5% betadine to ocular surface, eyelid speculum. A (32g) needle was used.   Injection: 1.25 mg Bevacizumab  1.25mg /0.15ml   Route: Intravitreal, Site: Left Eye   NDC: H525437, Lot: [id number], Expiration date: 09/19/2024   Post-op Post injection exam found visual acuity of at least counting fingers. The patient tolerated the procedure well. There were no complications. The patient received written and verbal post procedure care education. Post injection medications were not given.   Notes             ASSESSMENT/PLAN:    ICD-10-CM   1. Moderate nonproliferative diabetic retinopathy of both eyes with macular edema associated with type 2 diabetes mellitus (HCC)  E11.3313 OCT, Retina - OU -  Both Eyes    Intravitreal Injection, Pharmacologic Agent - OD - Right Eye    Intravitreal Injection, Pharmacologic Agent - OS - Left Eye    Bevacizumab  (AVASTIN ) SOLN 1.25 mg    Bevacizumab  (AVASTIN ) SOLN 1.25 mg    2. Diabetes mellitus treated with oral medication (HCC)  E11.9    Z79.84     3. Retinal hole of right eye  H33.321     4. Essential hypertension  I10     5. Hypertensive retinopathy of both eyes  H35.033     6. Pseudophakia, both eyes  Z96.1     7. Dry eyes  H04.123       1,2. Moderate non-proliferative diabetic retinopathy, OU  - delayed follow up from 8 weeks to 12.5 weeks (01.15.25 to 04.14.25)  - delayed follow up from 7 weeks to 9 weeks (09.30.24 to 11.20.24)  - s/p IVA OD #1 (09.16.24), #2 (11.20.24), #3 (01.14.25), #4 (04.14.25), #5 (06.02.25 ) - s/p IVA OS #1 (05.07.21), #2 (06.04.21), #3 (07.06.21), #4 (08.03.21), #5 (08.31.21), #6 (9.28.21), #7 (11.2.21), #8 (12.07.21), #9 (02.02.22), #10 (03.30.22), #11 (05.25.22), #12 (08.03.22), #13 (10.20.22), #14 (01.12.23), #15 (01.22.24), #16 (03.11.24), #17 (03.11.24), #18 (04.22.24), #19 (06.03.24), #20 (07.15.24), #21 (04.14.25), #22 (06.02.25) ================ - s/p IVE OS #1 (08.19.24), #2 (09.16.24), #3 (11.20.24), #4 (sample -- 01.14.25)  - s/p PRP OS (04.09.21) -- good laser changes - s/p PRP OD (02.05.24) for peripheral vascular nonperfusion -- good laser changes - repeat FA (09.28.21) shows late leaking MA OU, significant capillary drop-out  - repeat FA (01.23.24) shows OD: Scattered patches of vascular non-perfusion; large area of vascular non perfusion temporally, no NV; OS: delayed filling; scattered patches of vascular non perfusion peripherally, staining of 360 laser, scattered leaking MA -- most prominent IT to fovea, no NV  - exam shows scattered MA/IRH/CWS OU -- improving; new vitreous opacities OU improved  - BCVA OD 20/70 from 20/80, OS stable at 20/30  - OCT shows OD: NFP, no IRF / SRF -- mild  diffuse retinal thinning -- stable, mild interval improvement in scattered cystic changes, persistent vitreous opacities; OS: NFP, no SRF, Persistent IRF / IRHM temporal fovea and mac, +vitreous opacities -- improved at 7 weeks - recommend IVA OU (OD #6 and OS #23) today, 7.21.25 with f/u at 7 weeks again - [employer] funding unavailable at this time - RBA of procedure discussed, questions answered - see procedure note - IVA informed consent obtained and signed OD (09.16.24) - IVE informed consent obtained and signed OS (08.19.24)  - f/u 7 weeks, DFE, OCT, possible injxns  3. Operculated retinal hole w/ cuff of SRF / focal RD, right eye - operculated hole located at 1000 with partial pigment and +cuff of SRF / focal RD  - s/p retinopexy OD (03.17.21) -- good laser changes surrounding  - stable -- monitor  4,5. Hypertensive retinopathy OU  - discussed importance of tight BP control  - monitor  6. Pseudophakia OU  - s/p CE/IOL (Dr. CANDIE Gaudy, OD: 07.02.24)  - IOL in good position, doing well  - monitor  7. Dry eyes OU  - recommend artificial tears and lubricating ointment as needed  - monitor  Ophthalmic Meds Ordered this visit:  Meds ordered this encounter  Medications   Bevacizumab  (AVASTIN ) SOLN 1.25 mg   Bevacizumab  (AVASTIN ) SOLN 1.25 mg     Return in about 7 weeks (around 08/29/2024) for NPDR OU, DFE, OCT, Possible Injxn.  There are no Patient  Instructions on file for this visit.  Explained the diagnoses, plan, and follow up with the patient and they expressed understanding.  Patient expressed understanding of the importance of proper follow up care.   This document serves as a record of services personally performed by Redell JUDITHANN Hans, MD, PhD. It was created on their behalf by Avelina Pereyra, COA an ophthalmic technician. The creation of this record is the provider's dictation and/or activities during the visit.   Electronically signed by: Avelina GORMAN Pereyra, COT  07/11/24   1:10 PM   This document serves as a record of services personally performed by Redell JUDITHANN Hans, MD, PhD. It was created on their behalf by Almetta Pesa, an ophthalmic technician. The creation of this record is the provider's dictation and/or activities during the visit.    Electronically signed by: Almetta Pesa, OA, 07/11/24  1:10 PM  Redell JUDITHANN Hans, M.D., Ph.D. Diseases & Surgery of the Retina and Vitreous [facility]  I have reviewed the above documentation for accuracy and completeness, and I agree with the above. Redell JUDITHANN Hans, M.D., Ph.D. 07/11/24 1:12 PM   Abbreviations: M myopia (nearsighted); A astigmatism; H hyperopia (farsighted); P presbyopia; Mrx spectacle prescription;  CTL contact lenses; OD right eye; OS left eye; OU both eyes  XT exotropia; ET esotropia; PEK punctate epithelial keratitis; PEE punctate epithelial erosions; DES dry eye syndrome; MGD meibomian gland dysfunction; ATs artificial tears; PFAT's preservative free artificial tears; NSC nuclear sclerotic cataract; PSC posterior subcapsular cataract; ERM epi-retinal membrane; PVD posterior vitreous detachment; RD retinal detachment; DM diabetes mellitus; DR diabetic retinopathy; NPDR non-proliferative diabetic retinopathy; PDR proliferative diabetic retinopathy; CSME clinically significant macular edema; DME diabetic macular edema; dbh dot blot hemorrhages; CWS cotton wool spot; POAG primary open angle glaucoma; C/D cup-to-disc ratio; HVF humphrey visual field; GVF goldmann visual field; OCT optical coherence tomography; IOP intraocular pressure; BRVO Branch retinal vein occlusion; CRVO central retinal vein occlusion; CRAO central retinal artery occlusion; BRAO branch retinal artery occlusion; RT retinal tear; SB scleral buckle; PPV pars plana vitrectomy; VH Vitreous hemorrhage; PRP panretinal laser photocoagulation; IVK intravitreal kenalog; VMT vitreomacular traction; MH Macular hole;  NVD  neovascularization of the disc; NVE neovascularization elsewhere; AREDS age related eye disease study; ARMD age related macular degeneration; POAG primary open angle glaucoma; EBMD epithelial/anterior basement membrane dystrophy; ACIOL anterior chamber intraocular lens; IOL intraocular lens; PCIOL posterior chamber intraocular lens; Phaco/IOL phacoemulsification with intraocular lens placement; PRK photorefractive keratectomy; LASIK laser assisted in situ keratomileusis; HTN hypertension; DM diabetes mellitus; COPD chronic obstructive pulmonary disease

## 2024-07-04 ENCOUNTER — Ambulatory Visit: Payer: Medicare HMO

## 2024-07-06 NOTE — Progress Notes (Signed)
 Carelink Summary Report / Loop Recorder

## 2024-07-11 ENCOUNTER — Encounter (INDEPENDENT_AMBULATORY_CARE_PROVIDER_SITE_OTHER): Payer: Self-pay | Admitting: Ophthalmology

## 2024-07-11 ENCOUNTER — Ambulatory Visit (INDEPENDENT_AMBULATORY_CARE_PROVIDER_SITE_OTHER): Admitting: Ophthalmology

## 2024-07-11 DIAGNOSIS — H35033 Hypertensive retinopathy, bilateral: Secondary | ICD-10-CM

## 2024-07-11 DIAGNOSIS — H33321 Round hole, right eye: Secondary | ICD-10-CM

## 2024-07-11 DIAGNOSIS — Z7984 Long term (current) use of oral hypoglycemic drugs: Secondary | ICD-10-CM

## 2024-07-11 DIAGNOSIS — H04123 Dry eye syndrome of bilateral lacrimal glands: Secondary | ICD-10-CM

## 2024-07-11 DIAGNOSIS — E119 Type 2 diabetes mellitus without complications: Secondary | ICD-10-CM

## 2024-07-11 DIAGNOSIS — I10 Essential (primary) hypertension: Secondary | ICD-10-CM

## 2024-07-11 DIAGNOSIS — E113313 Type 2 diabetes mellitus with moderate nonproliferative diabetic retinopathy with macular edema, bilateral: Secondary | ICD-10-CM

## 2024-07-11 DIAGNOSIS — Z961 Presence of intraocular lens: Secondary | ICD-10-CM

## 2024-07-11 MED ORDER — BEVACIZUMAB CHEMO INJECTION 1.25MG/0.05ML SYRINGE FOR KALEIDOSCOPE
1.2500 mg | INTRAVITREAL | Status: AC | PRN
  Administered 2024-07-11: 1.25 mg via INTRAVITREAL

## 2024-07-15 ENCOUNTER — Emergency Department (HOSPITAL_COMMUNITY)

## 2024-07-15 ENCOUNTER — Encounter (HOSPITAL_COMMUNITY): Payer: Self-pay | Admitting: *Deleted

## 2024-07-15 ENCOUNTER — Emergency Department (HOSPITAL_COMMUNITY)
Admission: EM | Admit: 2024-07-15 | Discharge: 2024-07-15 | Disposition: A | Discharge status: Home / Self Care | Source: Skilled Nursing Facility | Attending: Emergency Medicine | Admitting: Emergency Medicine

## 2024-07-15 ENCOUNTER — Other Ambulatory Visit: Payer: Self-pay

## 2024-07-15 DIAGNOSIS — S065X0A Traumatic subdural hemorrhage without loss of consciousness, initial encounter: Secondary | ICD-10-CM | POA: Diagnosis not present

## 2024-07-15 DIAGNOSIS — Z7982 Long term (current) use of aspirin: Secondary | ICD-10-CM | POA: Insufficient documentation

## 2024-07-15 DIAGNOSIS — E119 Type 2 diabetes mellitus without complications: Secondary | ICD-10-CM | POA: Insufficient documentation

## 2024-07-15 DIAGNOSIS — Z79899 Other long term (current) drug therapy: Secondary | ICD-10-CM | POA: Insufficient documentation

## 2024-07-15 DIAGNOSIS — S0001XA Abrasion of scalp, initial encounter: Secondary | ICD-10-CM | POA: Diagnosis not present

## 2024-07-15 DIAGNOSIS — W19XXXA Unspecified fall, initial encounter: Secondary | ICD-10-CM

## 2024-07-15 DIAGNOSIS — F0390 Unspecified dementia without behavioral disturbance: Secondary | ICD-10-CM | POA: Insufficient documentation

## 2024-07-15 DIAGNOSIS — W01198A Fall on same level from slipping, tripping and stumbling with subsequent striking against other object, initial encounter: Secondary | ICD-10-CM | POA: Diagnosis not present

## 2024-07-15 DIAGNOSIS — R55 Syncope and collapse: Secondary | ICD-10-CM | POA: Insufficient documentation

## 2024-07-15 DIAGNOSIS — I10 Essential (primary) hypertension: Secondary | ICD-10-CM | POA: Insufficient documentation

## 2024-07-15 DIAGNOSIS — S0990XA Unspecified injury of head, initial encounter: Secondary | ICD-10-CM | POA: Diagnosis present

## 2024-07-15 DIAGNOSIS — S065XAA Traumatic subdural hemorrhage with loss of consciousness status unknown, initial encounter: Secondary | ICD-10-CM

## 2024-07-15 DIAGNOSIS — Y92129 Unspecified place in nursing home as the place of occurrence of the external cause: Secondary | ICD-10-CM | POA: Diagnosis not present

## 2024-07-15 LAB — URINALYSIS, ROUTINE W REFLEX MICROSCOPIC
Bacteria, UA: NONE SEEN
Bilirubin Urine: NEGATIVE
Glucose, UA: NEGATIVE mg/dL
Ketones, ur: NEGATIVE mg/dL
Leukocytes,Ua: NEGATIVE
Nitrite: NEGATIVE
Protein, ur: 100 mg/dL — AB
Specific Gravity, Urine: 1.014 (ref 1.005–1.030)
pH: 6 (ref 5.0–8.0)

## 2024-07-15 LAB — COMPREHENSIVE METABOLIC PANEL WITH GFR
ALT: 12 U/L (ref 0–44)
AST: 19 U/L (ref 15–41)
Albumin: 3.8 g/dL (ref 3.5–5.0)
Alkaline Phosphatase: 51 U/L (ref 38–126)
Anion gap: 9 (ref 5–15)
BUN: 25 mg/dL — ABNORMAL HIGH (ref 8–23)
CO2: 26 mmol/L (ref 22–32)
Calcium: 8.9 mg/dL (ref 8.9–10.3)
Chloride: 104 mmol/L (ref 98–111)
Creatinine, Ser: 1.41 mg/dL — ABNORMAL HIGH (ref 0.61–1.24)
GFR, Estimated: 54 mL/min — ABNORMAL LOW (ref >60)
Glucose, Bld: 175 mg/dL — ABNORMAL HIGH (ref 70–99)
Potassium: 3.8 mmol/L (ref 3.5–5.1)
Sodium: 139 mmol/L (ref 135–145)
Total Bilirubin: 0.7 mg/dL (ref 0.0–1.2)
Total Protein: 7.6 g/dL (ref 6.5–8.1)

## 2024-07-15 LAB — CBC WITH DIFFERENTIAL/PLATELET
Abs Immature Granulocytes: 0.02 K/uL (ref 0.00–0.07)
Basophils Absolute: 0 K/uL (ref 0.0–0.1)
Basophils Relative: 0 %
Eosinophils Absolute: 0.2 K/uL (ref 0.0–0.5)
Eosinophils Relative: 3 %
HCT: 39.1 % (ref 39.0–52.0)
Hemoglobin: 12.7 g/dL — ABNORMAL LOW (ref 13.0–17.0)
Immature Granulocytes: 0 %
Lymphocytes Relative: 15 %
Lymphs Abs: 1 K/uL (ref 0.7–4.0)
MCH: 27.7 pg (ref 26.0–34.0)
MCHC: 32.5 g/dL (ref 30.0–36.0)
MCV: 85.2 fL (ref 80.0–100.0)
Monocytes Absolute: 0.4 K/uL (ref 0.1–1.0)
Monocytes Relative: 6 %
Neutro Abs: 4.9 K/uL (ref 1.7–7.7)
Neutrophils Relative %: 76 %
Platelets: 184 K/uL (ref 150–400)
RBC: 4.59 MIL/uL (ref 4.22–5.81)
RDW: 14.8 % (ref 11.5–15.5)
WBC: 6.5 K/uL (ref 4.0–10.5)
nRBC: 0 % (ref 0.0–0.2)

## 2024-07-15 LAB — CBG MONITORING, ED
Glucose-Capillary: 154 mg/dL — ABNORMAL HIGH (ref 70–99)
Glucose-Capillary: 149 mg/dL — ABNORMAL HIGH (ref 70–99)

## 2024-07-15 MED ORDER — SODIUM CHLORIDE 0.9 % IV BOLUS
500.0000 mL | Freq: Once | INTRAVENOUS | Status: AC
  Administered 2024-07-15: 500 mL via INTRAVENOUS

## 2024-07-15 MED ORDER — DIVALPROEX SODIUM 125 MG PO CSDR
125.0000 mg | DELAYED_RELEASE_CAPSULE | Freq: Once | ORAL | Status: AC
  Administered 2024-07-15: 125 mg via ORAL
  Filled 2024-07-15: qty 1

## 2024-07-15 MED ORDER — BACITRACIN ZINC 500 UNIT/GM EX OINT
TOPICAL_OINTMENT | Freq: Two times a day (BID) | CUTANEOUS | Status: DC
  Filled 2024-07-15: qty 0.9

## 2024-07-15 MED ORDER — CARVEDILOL 3.125 MG PO TABS
6.2500 mg | ORAL_TABLET | Freq: Once | ORAL | Status: AC
  Administered 2024-07-15: 6.25 mg via ORAL
  Filled 2024-07-15: qty 2

## 2024-07-15 MED ORDER — SODIUM CHLORIDE 0.9% FLUSH
3.0000 mL | Freq: Once | INTRAVENOUS | Status: AC
  Administered 2024-07-15: 3 mL via INTRAVENOUS

## 2024-07-15 NOTE — ED Provider Notes (Signed)
 [place] EMERGENCY DEPARTMENT AT [facility] Provider Note   CSN: [id number] Arrival date & time: 07/15/24  1422     Patient presents with: [name] is a 73 y.o. male.  With a history of dementia, syncope, type 2 diabetes, CVA and hypertension who presents to the ED after syncopal episode.  Patient coming in from skilled nursing facility where he had a witnessed syncopal episode.  Fall with associated head trauma striking the table on the left side of his head.  EMS did not appreciate any seizure activity nor was the patient postictal upon their arrival.  Patient unable to contribute additional history secondary to dementia.  C-collar in place.  No anticoagulation    Fall       Prior to Admission medications   Medication Sig Start Date End Date Taking? Authorizing Provider  amLODipine  (NORVASC ) 5 MG tablet Take 1 tablet (5 mg total) by mouth daily. 03/09/24 05/14/24  Lanetta Lingo, MD  aspirin  EC 81 MG tablet Take 1 tablet (81 mg total) by mouth daily. Swallow whole. 03/09/24 03/09/25  Jhonny Calvin NOVAK, MD  atorvastatin  (LIPITOR) 40 MG tablet Take 1 tablet (40 mg total) by mouth daily at 6 PM. 01/23/20   [name] Nest, DO  carvedilol  (COREG ) 6.25 MG tablet Take 6.25 mg by mouth 2 (two) times daily with a meal.    [provider]  divalproex (DEPAKOTE SPRINKLE) 125 MG capsule Take 125 mg by mouth 2 (two) times daily. 03/29/24   [provider]  donepezil  (ARICEPT ) 10 MG tablet Take 1 tablet (10 mg total) by mouth at bedtime. 04/06/24   Whitfield Raisin, NP  hydrochlorothiazide  (HYDRODIURIL ) 25 MG tablet Take 25 mg by mouth daily. 03/21/24   [provider]  latanoprost  (XALATAN ) 0.005 % ophthalmic solution Place 1 drop into both eyes at bedtime.    [provider]  LORazepam  (ATIVAN ) 0.5 MG tablet Take 0.5 mg by mouth every 8 (eight) hours as needed. 03/25/24   [provider]  losartan  (COZAAR ) 50 MG tablet Take 50 mg  by mouth 2 (two) times daily. 03/21/24   [provider]  memantine  (NAMENDA ) 10 MG tablet Take 1 tablet (10 mg total) by mouth 2 (two) times daily. 11/16/23   Whitfield Raisin, NP  metFORMIN  (GLUCOPHAGE ) 500 MG tablet Take by mouth 2 (two) times daily with a meal.    [provider]  Multiple Vitamins-Minerals (ONE-A-DAY MENS 50+ PO) Take 1 tablet by mouth daily.    [provider]  potassium chloride  SA (KLOR-CON  M) 20 MEQ tablet Take 20 mEq by mouth daily. 04/12/24   [provider]  QUEtiapine  (SEROQUEL ) 100 MG tablet Take 1 tablet (100 mg total) by mouth at bedtime. 03/08/24 05/14/24  Lanetta Lingo, MD  silver sulfADIAZINE (SILVADENE) 1 % cream Apply 1 Application topically 2 (two) times daily. 03/25/24   [provider]  tamsulosin  (FLOMAX ) 0.4 MG CAPS capsule Take 0.4 mg by mouth daily. 02/16/24   [provider]  traZODone  (DESYREL ) 150 MG tablet TAKE 1 TABLET AT BEDTIME 04/05/24   Whitfield Raisin, NP    Allergies: Other and Shellfish allergy    Review of Systems  Updated Vital Signs BP (!) 164/80 (BP Location: Right Arm)   Pulse 95   Temp 99.6 F (37.6 C) (Oral)   Resp 20   Wt 86.2 kg   SpO2 95%   BMI 29.76 kg/m   Physical Exam Vitals and nursing note reviewed.  HENT:  Head:     Comments: Small abrasion over left parietal scalp Eyes:     Pupils: Pupils are equal, round, and reactive to light.  Cardiovascular:     Rate and Rhythm: Normal rate and regular rhythm.  Pulmonary:     Effort: Pulmonary effort is normal.     Breath sounds: Normal breath sounds.  Abdominal:     Palpations: Abdomen is soft.     Tenderness: There is no abdominal tenderness.  Musculoskeletal:     Cervical back: Neck supple. Tenderness present.     Comments: No midline tenderness step-off to the low back Patient able to stand and bear weight and walk with assistance No trauma appreciated to upper extremity  Skin:    General: Skin is warm and  dry.  Neurological:     General: No focal deficit present.     Mental Status: He is alert.     Sensory: No sensory deficit.     Motor: No weakness.  Psychiatric:        Mood and Affect: Mood normal.     (all labs ordered are listed, but only abnormal results are displayed) Labs Reviewed  COMPREHENSIVE METABOLIC PANEL WITH GFR - Abnormal; Notable for the following components:      Result Value   Glucose, Bld 175 (*)    BUN 25 (*)    Creatinine, Ser 1.41 (*)    GFR, Estimated 54 (*)    All other components within normal limits  URINALYSIS, ROUTINE W REFLEX MICROSCOPIC - Abnormal; Notable for the following components:   Hgb urine dipstick SMALL (*)    Protein, ur 100 (*)    All other components within normal limits  CBC WITH DIFFERENTIAL/PLATELET - Abnormal; Notable for the following components:   Hemoglobin 12.7 (*)    All other components within normal limits  CBG MONITORING, ED - Abnormal; Notable for the following components:   Glucose-Capillary 154 (*)    All other components within normal limits    EKG: EKG Interpretation Date/Time:  Friday July 15 2024 15:51:55 EDT Ventricular Rate:  99 PR Interval:  212 QRS Duration:  88 QT Interval:  336 QTC Calculation: 431 R Axis:   3  Text Interpretation: Sinus rhythm with 1st degree A-V block with Premature atrial complexes Nonspecific T wave abnormality Abnormal ECG When compared with ECG of 27-Feb-2024 06:31, PREVIOUS ECG IS PRESENT Confirmed by Pamella Sharper [phone]) on 07/15/2024 5:08:28 PM  Radiology: CT Cervical Spine Wo Contrast Result Date: 07/15/2024 CLINICAL DATA:  Neck trauma (Age >= 65y) EXAM: CT CERVICAL SPINE WITHOUT CONTRAST TECHNIQUE: Multidetector CT imaging of the cervical spine was performed without intravenous contrast. Multiplanar CT image reconstructions were also generated. RADIATION DOSE REDUCTION: This exam was performed according to the departmental dose-optimization program which includes automated  exposure control, adjustment of the mA and/or kV according to patient size and/or use of iterative reconstruction technique. COMPARISON:  05/07/2023 FINDINGS: Alignment: No subluxation Skull base and vertebrae: No acute fracture. No primary bone lesion or focal pathologic process. Soft tissues and spinal canal: No prevertebral fluid or swelling. No visible canal hematoma. Disc levels: Large flowing anterior osteophytes from C4-C7. Moderate bilateral degenerative facet disease, left greater than right. Upper chest: No acute findings Other: None IMPRESSION: Degenerative disc and facet disease.  No acute bony abnormality. Electronically Signed   By: Franky Crease M.D.   On: 07/15/2024 17:11   CT Head Wo Contrast Result Date: 07/15/2024 CLINICAL DATA:  Head trauma, minor (Age >=  65y) EXAM: CT HEAD WITHOUT CONTRAST TECHNIQUE: Contiguous axial images were obtained from the base of the skull through the vertex without intravenous contrast. RADIATION DOSE REDUCTION: This exam was performed according to the departmental dose-optimization program which includes automated exposure control, adjustment of the mA and/or kV according to patient size and/or use of iterative reconstruction technique. COMPARISON:  02/27/2024 FINDINGS: Brain: High-density blood noted along the falx measuring up to 5 mm as well as along the left tentorium compatible with subdural hematoma. Old right temporoparietal infarct with encephalomalacia, stable. No mass effect or midline shift. No hydrocephalus. No intraparenchymal hemorrhage. Vascular: No hyperdense vessel or unexpected calcification. Skull: No acute calvarial abnormality. Sinuses/Orbits: No acute findings Other: Traumatic Brain Injury Risk Stratification Skull Fracture: No - Low/mBIG 1 Subdural Hematoma (SDH): 4mm to <51mm - mBIG 2 Subarachnoid Hemorrhage [facility]): No Epidural Hematoma (EDH): No - Low/mBIG 1 Cerebral contusion, intra-axial, intraparenchymal Hemorrhage (IPH): No Intraventricular  Hemorrhage (IVH): No - Low/mBIG 1 Midline Shift > 1mm or Edema/effacement of sulci/vents: No - Low/mBIG 1 ---------------------------------------------------- IMPRESSION: 5 mm subdural hematoma along the falx and left tentorium. No mass effect or midline shift. Old right MCA infarct with encephalomalacia. These results were called by telephone at the time of interpretation on 07/15/2024 at 5:07 pm to provider [facility] , who verbally acknowledged these results. Electronically Signed   By: Franky Crease M.D.   On: 07/15/2024 17:07   DG Pelvis 1-2 Views Result Date: 07/15/2024 CLINICAL DATA:  fall EXAM: PELVIS - 1-2 VIEW COMPARISON:  None Available. FINDINGS: No evidence of pelvic fracture or diastasis.No acute hip fracture or dislocation.Mild joint space loss of both hips.Chunky calcification in the midline pelvis, likely dystrophic calcification in the prostate gland. Degenerative disc disease of the spine. IMPRESSION: No acute fracture, pelvic bone diastasis, or dislocation. Electronically Signed   By: Rogelia Myers M.D.   On: 07/15/2024 16:56   DG Chest 2 View Result Date: 07/15/2024 CLINICAL DATA:  Pain after fall EXAM: CHEST - 2 VIEW COMPARISON:  X-ray 02/27/2024. FINDINGS: Normal cardiopericardial silhouette calcified tortuous aorta. Prominence of central vasculature. Presumed loop recorder overlying left hemithorax. No consolidation, pneumothorax or effusion. No edema. Lateral view is under penetrated and the posteroinferior costophrenic angles are clipped off the edge of the film. Mild degenerative changes along the spine. Although oral focus further concern of the injury additional cross-sectional study could be considered as clinically appropriate for further delineation. IMPRESSION: Prominent central vasculature. No acute cardiopulmonary disease. Loop recorder. Electronically Signed   By: Ranell Bring M.D.   On: 07/15/2024 16:52     Procedures   Medications Ordered in the ED  sodium  chloride flush (NS) 0.9 % injection 3 mL (3 mLs Intravenous Given 07/15/24 1545)  sodium chloride  0.9 % bolus 500 mL (500 mLs Intravenous New Bag/Given 07/15/24 1754)    Clinical Course as of 07/15/24 1842  Fri Jul 15, 2024  1742 Call taken from radiology.  Positive left-sided subdural 5 mm without midline shift.  Will page neurosurgery.  Patient remains at neurologic baseline.  No traumatic findings on CT C-spine pelvis or chest x-ray.  Laboratory workup notable for slight elevation in creatinine from baseline.  Will provide small IV fluid bolus [MP]  1840 I discussed the patient's presentation and CT findings with Dr.Cabbell (neurosurgery).  Given that the patient is not on anticoagulation and the bleed is small with no midline shift and the location of the bleed.  No indication for repeat imaging or further neurosurgery intervention  at this time.  Patient remains at baseline.  Given history of agitated dementia he would be better served at a skilled nursing facility.  Daughter is in agreement with plan for discharge back to nursing facility. [MP]    Clinical Course User Index [MP] Pamella Ozell LABOR, DO                                 Medical Decision Making 73 year old male with history as above presenting to the ED given concern for syncopal episode with subsequent fall.  Left-sided head trauma.  No seizure-like activity or postictal period.  Has returned to mental baseline.  Afebrile slightly hypertensive.  Agitated with history of dementia.  Unable to contribute additional history.  Will obtain CT head C-spine chest x-ray pelvis x-ray to look for traumatic injury.  Will obtain basic laboratory workup and EKG to look for underlying metabolic disturbance or dysrhythmia as cause of syncope.  Daughter at bedside.  Amount and/or Complexity of Data Reviewed Labs: ordered. Radiology: ordered.        Final diagnoses:  Fall, initial encounter  Subdural hematoma [facility])    ED Discharge Orders      None          Pamella Ozell LABOR, DO 07/15/24 1842
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
## 2024-07-15 NOTE — ED Notes (Signed)
 C/c appears to be c-collar

## 2024-07-15 NOTE — ED Notes (Signed)
 Family verbalizes that they are taking pt back to [facility].

## 2024-07-15 NOTE — ED Notes (Signed)
 Pt speaking with family at [facility]. C-collar in place.

## 2024-07-15 NOTE — ED Notes (Addendum)
 EDP at [facility], updated pt/family. C-collar removed.

## 2024-07-15 NOTE — ED Notes (Signed)
 Family left BS, changing out with other family here in lobby-w/r

## 2024-07-15 NOTE — Discharge Instructions (Signed)
 You were seen in the emergency department for a fall There was a small area of bleeding on the brain We talked about this with neurosurgery and there is nothing else to do for this problem The blood will resorb on its own You should return to the emergency department for repeated falls, if he has weakness on one side of his body or any other concerns Otherwise he should follow-up with his primary care doctor within 1 week for reevaluation Continue taking all previous prescribed occasions

## 2024-07-15 NOTE — ED Notes (Signed)
 No changes. No sz activity noted. Repetitive c/o c-collar. Alert, NAD. Family at [facility].

## 2024-07-15 NOTE — ED Notes (Signed)
 Alert, NAD, calm, interactive, feel better. Family at [facility].

## 2024-07-15 NOTE — Progress Notes (Signed)
 Patient ID: [name], male   DOB: 05-05-1951, 73 y.o.   MRN: [id number] BP (!) 164/80 (BP Location: Right Arm)   Pulse 95   Temp 99.6 F (37.6 C) (Oral)   Resp 20   Wt 86.2 kg   SpO2 95%   BMI 29.76 kg/m  Head CT reviewed. Small falcine subdural hematoma. No need for repeat scan, agree with syncope evaluation. No operative indications on this scan. Basal cisterns widely patent, ventricles not effaced. No danger of herniation.

## 2024-07-15 NOTE — ED Notes (Signed)
 Pt now mentioning head, neck and shoulder pain

## 2024-07-21 ENCOUNTER — Ambulatory Visit (INDEPENDENT_AMBULATORY_CARE_PROVIDER_SITE_OTHER)

## 2024-07-21 DIAGNOSIS — R55 Syncope and collapse: Secondary | ICD-10-CM

## 2024-07-21 LAB — CUP PACEART REMOTE DEVICE CHECK
Date Time Interrogation Session: 20250730230342
Implantable Pulse Generator Implant Date: 20210201

## 2024-07-24 ENCOUNTER — Ambulatory Visit: Payer: Self-pay | Admitting: Cardiology

## 2024-07-29 ENCOUNTER — Ambulatory Visit: Payer: Medicare HMO

## 2024-08-08 ENCOUNTER — Ambulatory Visit: Payer: Medicare HMO

## 2024-08-08 ENCOUNTER — Other Ambulatory Visit: Payer: Self-pay

## 2024-08-08 ENCOUNTER — Emergency Department (HOSPITAL_COMMUNITY)
Admission: EM | Admit: 2024-08-08 | Discharge: 2024-08-08 | Disposition: A | Discharge status: Skilled Nursing Facility | Source: Skilled Nursing Facility | Attending: Emergency Medicine | Admitting: Emergency Medicine

## 2024-08-08 ENCOUNTER — Encounter (HOSPITAL_COMMUNITY): Payer: Self-pay

## 2024-08-08 DIAGNOSIS — I10 Essential (primary) hypertension: Secondary | ICD-10-CM | POA: Diagnosis not present

## 2024-08-08 DIAGNOSIS — Z8673 Personal history of transient ischemic attack (TIA), and cerebral infarction without residual deficits: Secondary | ICD-10-CM | POA: Diagnosis not present

## 2024-08-08 DIAGNOSIS — Z79899 Other long term (current) drug therapy: Secondary | ICD-10-CM | POA: Diagnosis not present

## 2024-08-08 DIAGNOSIS — F0390 Unspecified dementia without behavioral disturbance: Secondary | ICD-10-CM | POA: Diagnosis not present

## 2024-08-08 DIAGNOSIS — R4689 Other symptoms and signs involving appearance and behavior: Secondary | ICD-10-CM

## 2024-08-08 DIAGNOSIS — E119 Type 2 diabetes mellitus without complications: Secondary | ICD-10-CM | POA: Diagnosis not present

## 2024-08-08 DIAGNOSIS — Z7982 Long term (current) use of aspirin: Secondary | ICD-10-CM | POA: Diagnosis not present

## 2024-08-08 DIAGNOSIS — R456 Violent behavior: Secondary | ICD-10-CM | POA: Insufficient documentation

## 2024-08-08 LAB — URINALYSIS, ROUTINE W REFLEX MICROSCOPIC
Bilirubin Urine: NEGATIVE
Glucose, UA: NEGATIVE mg/dL
Hgb urine dipstick: NEGATIVE
Ketones, ur: NEGATIVE mg/dL
Leukocytes,Ua: NEGATIVE
Nitrite: NEGATIVE
Protein, ur: NEGATIVE mg/dL
Specific Gravity, Urine: 1.015 (ref 1.005–1.030)
pH: 8 (ref 5.0–8.0)

## 2024-08-08 LAB — BASIC METABOLIC PANEL WITH GFR
Anion gap: 8 (ref 5–15)
BUN: 21 mg/dL (ref 8–23)
CO2: 27 mmol/L (ref 22–32)
Calcium: 8.5 mg/dL — ABNORMAL LOW (ref 8.9–10.3)
Chloride: 107 mmol/L (ref 98–111)
Creatinine, Ser: 1.22 mg/dL (ref 0.61–1.24)
GFR, Estimated: >60 mL/min (ref >60)
Glucose, Bld: 108 mg/dL — ABNORMAL HIGH (ref 70–99)
Potassium: 4 mmol/L (ref 3.5–5.1)
Sodium: 142 mmol/L (ref 135–145)

## 2024-08-08 LAB — CBC WITH DIFFERENTIAL/PLATELET
Abs Immature Granulocytes: 0.02 K/uL (ref 0.00–0.07)
Basophils Absolute: 0 K/uL (ref 0.0–0.1)
Basophils Relative: 0 %
Eosinophils Absolute: 0.3 K/uL (ref 0.0–0.5)
Eosinophils Relative: 6 %
HCT: 36.1 % — ABNORMAL LOW (ref 39.0–52.0)
Hemoglobin: 11.2 g/dL — ABNORMAL LOW (ref 13.0–17.0)
Immature Granulocytes: 0 %
Lymphocytes Relative: 31 %
Lymphs Abs: 1.5 K/uL (ref 0.7–4.0)
MCH: 27.1 pg (ref 26.0–34.0)
MCHC: 31 g/dL (ref 30.0–36.0)
MCV: 87.4 fL (ref 80.0–100.0)
Monocytes Absolute: 0.4 K/uL (ref 0.1–1.0)
Monocytes Relative: 9 %
Neutro Abs: 2.5 K/uL (ref 1.7–7.7)
Neutrophils Relative %: 54 %
Platelets: 223 K/uL (ref 150–400)
RBC: 4.13 MIL/uL — ABNORMAL LOW (ref 4.22–5.81)
RDW: 14.7 % (ref 11.5–15.5)
WBC: 4.7 K/uL (ref 4.0–10.5)
nRBC: 0 % (ref 0.0–0.2)
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
## 2024-08-08 NOTE — ED Notes (Signed)
 NT tried twice to get blood, unsuccessful

## 2024-08-08 NOTE — ED Notes (Signed)
 Pt resting quietly, 2 family members sitting at bedside
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
## 2024-08-08 NOTE — ED Notes (Signed)
 Pt daughter is here, states she will take pt back to facility. PTAR cancelled

## 2024-08-08 NOTE — ED Notes (Signed)
 PTAR notified for transport

## 2024-08-08 NOTE — Discharge Instructions (Signed)
 Patient was seen today for aggressive behavior.  Patient appears to be at baseline with no new changes, reassuring physical exam and labs.  No emergent cause for his change in aggression today noted by staff.  Patient not noted to be aggressive here.  Will recommend that he continues to follow-up with PCP.

## 2024-08-11 ENCOUNTER — Ambulatory Visit (INDEPENDENT_AMBULATORY_CARE_PROVIDER_SITE_OTHER)
Admission: EM | Admit: 2024-08-11 | Discharge: 2024-08-11 | Disposition: A | Discharge status: Other Acute Inpatient Hospital

## 2024-08-11 ENCOUNTER — Emergency Department (HOSPITAL_COMMUNITY)

## 2024-08-11 ENCOUNTER — Other Ambulatory Visit: Payer: Self-pay

## 2024-08-11 ENCOUNTER — Emergency Department (HOSPITAL_COMMUNITY)
Admission: EM | Admit: 2024-08-11 | Discharge: 2024-09-07 | Disposition: A | Discharge status: Other Acute Inpatient Hospital | Attending: Emergency Medicine | Admitting: Emergency Medicine

## 2024-08-11 ENCOUNTER — Encounter (HOSPITAL_COMMUNITY): Payer: Self-pay

## 2024-08-11 DIAGNOSIS — Z7982 Long term (current) use of aspirin: Secondary | ICD-10-CM | POA: Insufficient documentation

## 2024-08-11 DIAGNOSIS — Z79899 Other long term (current) drug therapy: Secondary | ICD-10-CM | POA: Insufficient documentation

## 2024-08-11 DIAGNOSIS — F03918 Unspecified dementia, unspecified severity, with other behavioral disturbance: Secondary | ICD-10-CM | POA: Insufficient documentation

## 2024-08-11 DIAGNOSIS — R030 Elevated blood-pressure reading, without diagnosis of hypertension: Secondary | ICD-10-CM

## 2024-08-11 DIAGNOSIS — Z9181 History of falling: Secondary | ICD-10-CM | POA: Insufficient documentation

## 2024-08-11 DIAGNOSIS — I10 Essential (primary) hypertension: Secondary | ICD-10-CM | POA: Diagnosis not present

## 2024-08-11 DIAGNOSIS — F0390 Unspecified dementia without behavioral disturbance: Secondary | ICD-10-CM

## 2024-08-11 DIAGNOSIS — R4182 Altered mental status, unspecified: Secondary | ICD-10-CM | POA: Diagnosis present

## 2024-08-11 LAB — RAPID URINE DRUG SCREEN, HOSP PERFORMED
Amphetamines: NOT DETECTED
Barbiturates: NOT DETECTED
Benzodiazepines: NOT DETECTED
Cocaine: NOT DETECTED
Opiates: NOT DETECTED
Tetrahydrocannabinol: NOT DETECTED

## 2024-08-11 LAB — URINALYSIS, ROUTINE W REFLEX MICROSCOPIC
Bacteria, UA: NONE SEEN
Bilirubin Urine: NEGATIVE
Glucose, UA: NEGATIVE mg/dL
Ketones, ur: NEGATIVE mg/dL
Leukocytes,Ua: NEGATIVE
Nitrite: NEGATIVE
Protein, ur: NEGATIVE mg/dL
Specific Gravity, Urine: 1.014 (ref 1.005–1.030)
pH: 6 (ref 5.0–8.0)

## 2024-08-11 LAB — COMPREHENSIVE METABOLIC PANEL WITH GFR
ALT: 10 U/L (ref 0–44)
AST: 14 U/L — ABNORMAL LOW (ref 15–41)
Albumin: 3.3 g/dL — ABNORMAL LOW (ref 3.5–5.0)
Alkaline Phosphatase: 50 U/L (ref 38–126)
Anion gap: 8 (ref 5–15)
BUN: 21 mg/dL (ref 8–23)
CO2: 28 mmol/L (ref 22–32)
Calcium: 9.1 mg/dL (ref 8.9–10.3)
Chloride: 107 mmol/L (ref 98–111)
Creatinine, Ser: 1.21 mg/dL (ref 0.61–1.24)
GFR, Estimated: >60 mL/min (ref >60)
Glucose, Bld: 135 mg/dL — ABNORMAL HIGH (ref 70–99)
Potassium: 3.8 mmol/L (ref 3.5–5.1)
Sodium: 143 mmol/L (ref 135–145)
Total Bilirubin: 0.7 mg/dL (ref 0.0–1.2)
Total Protein: 7.3 g/dL (ref 6.5–8.1)

## 2024-08-11 LAB — CBC
HCT: 36.5 % — ABNORMAL LOW (ref 39.0–52.0)
Hemoglobin: 12.1 g/dL — ABNORMAL LOW (ref 13.0–17.0)
MCH: 28.2 pg (ref 26.0–34.0)
MCHC: 33.2 g/dL (ref 30.0–36.0)
MCV: 85.1 fL (ref 80.0–100.0)
Platelets: 198 K/uL (ref 150–400)
RBC: 4.29 MIL/uL (ref 4.22–5.81)
RDW: 14.6 % (ref 11.5–15.5)
WBC: 5.1 K/uL (ref 4.0–10.5)
nRBC: 0 % (ref 0.0–0.2)

## 2024-08-11 LAB — ETHANOL: Alcohol, Ethyl (B): <15 mg/dL (ref <15)

## 2024-08-11 MED ORDER — LATANOPROST 0.005 % OP SOLN
1.0000 [drp] | Freq: Every day | OPHTHALMIC | Status: DC
  Administered 2024-08-13 – 2024-09-06 (×25): 1 [drp] via OPHTHALMIC
  Filled 2024-08-11 (×2): qty 2.5

## 2024-08-11 MED ORDER — QUETIAPINE FUMARATE 25 MG PO TABS
100.0000 mg | ORAL_TABLET | Freq: Every day | ORAL | Status: DC
  Administered 2024-08-11 – 2024-09-06 (×27): 100 mg via ORAL
  Filled 2024-08-11 (×4): qty 1
  Filled 2024-08-11: qty 4
  Filled 2024-08-11 (×16): qty 1
  Filled 2024-08-11: qty 4
  Filled 2024-08-11 (×5): qty 1

## 2024-08-11 MED ORDER — MEMANTINE HCL 10 MG PO TABS
10.0000 mg | ORAL_TABLET | Freq: Two times a day (BID) | ORAL | Status: DC
  Administered 2024-08-11 – 2024-09-07 (×54): 10 mg via ORAL
  Filled 2024-08-11 (×54): qty 1

## 2024-08-11 MED ORDER — AMLODIPINE BESYLATE 5 MG PO TABS
5.0000 mg | ORAL_TABLET | Freq: Every day | ORAL | Status: DC
  Administered 2024-08-12 – 2024-09-07 (×27): 5 mg via ORAL
  Filled 2024-08-11 (×27): qty 1

## 2024-08-11 MED ORDER — ATORVASTATIN CALCIUM 40 MG PO TABS
40.0000 mg | ORAL_TABLET | Freq: Every day | ORAL | Status: DC
  Administered 2024-08-12 – 2024-09-06 (×26): 40 mg via ORAL
  Filled 2024-08-11 (×26): qty 1

## 2024-08-11 MED ORDER — METFORMIN HCL 500 MG PO TABS
500.0000 mg | ORAL_TABLET | Freq: Two times a day (BID) | ORAL | Status: DC
  Administered 2024-08-13 – 2024-09-02 (×40): 500 mg via ORAL
  Filled 2024-08-11 (×43): qty 1

## 2024-08-11 MED ORDER — DIVALPROEX SODIUM 125 MG PO CSDR
125.0000 mg | DELAYED_RELEASE_CAPSULE | Freq: Two times a day (BID) | ORAL | Status: DC
  Administered 2024-08-11 – 2024-09-07 (×54): 125 mg via ORAL
  Filled 2024-08-11 (×54): qty 1

## 2024-08-11 MED ORDER — CARVEDILOL 3.125 MG PO TABS
6.2500 mg | ORAL_TABLET | Freq: Two times a day (BID) | ORAL | Status: DC
  Administered 2024-08-12 – 2024-09-07 (×53): 6.25 mg via ORAL
  Filled 2024-08-11 (×53): qty 2

## 2024-08-11 MED ORDER — DONEPEZIL HCL 10 MG PO TABS
10.0000 mg | ORAL_TABLET | Freq: Every day | ORAL | Status: DC
  Administered 2024-08-11 – 2024-09-06 (×27): 10 mg via ORAL
  Filled 2024-08-11 (×6): qty 2
  Filled 2024-08-11 (×2): qty 1
  Filled 2024-08-11 (×10): qty 2
  Filled 2024-08-11: qty 1
  Filled 2024-08-11 (×8): qty 2

## 2024-08-11 MED ORDER — ASPIRIN 81 MG PO TBEC
81.0000 mg | DELAYED_RELEASE_TABLET | Freq: Every day | ORAL | Status: DC
  Administered 2024-08-12 – 2024-09-07 (×27): 81 mg via ORAL
  Filled 2024-08-11 (×27): qty 1

## 2024-08-11 MED ORDER — TAMSULOSIN HCL 0.4 MG PO CAPS
0.4000 mg | ORAL_CAPSULE | Freq: Every day | ORAL | Status: DC
  Administered 2024-08-11 – 2024-09-06 (×27): 0.4 mg via ORAL
  Filled 2024-08-11 (×28): qty 1

## 2024-08-11 NOTE — ED Notes (Signed)
 Patient daughter [name] Kansas in lobby. Ms. Kansas states she is POA as patient has dementia and that the patient was sent here from a memory care facility. POA Confirmed with hard copy forms, digital forms, and ID confirmation. Writer made copy of POA forms for chart, and informed providers of POA forms. Writer confirmed patient's location in the facility as per leadership instruction following confirmation of identity and POA status. Ms. Kansas informed Clinical research associate that the memory care facility has sent discharge paperwork to her upon the patient's transfer to our facility for a workup. Ms. Kansas states he has no where to go. Providers Rolin Lipps, NP and Garvin Gaines, MD made aware.

## 2024-08-11 NOTE — Progress Notes (Signed)
 ICM will follow-up with [facility] in regards to discharge plan
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
## 2024-08-11 NOTE — Discharge Instructions (Addendum)
 Transferring to [facility] ED for medical clearance.

## 2024-08-11 NOTE — ED Provider Notes (Addendum)
 [place] EMERGENCY DEPARTMENT AT [facility] Provider Note   CSN: [id number] Arrival date & time: 08/11/24  8073     Patient presents with: Dementia   [name] is a 73 y.o. male.   Pt with hx dementia, with periodic/episodic agitated or aggressive behavior, presents from [facility] today, where patient was psychiatrically cleared and felt to be c/w baseline. Per report, pts current SNF, [place] had recently attempted to d/c and send to the [facility] (from report it is not clear whether or not that facility followed state regulations in terms of appropriate d/c, providing adequate notice, etc.). pt currently is calm and alert and denies any specific complaint.   The history is provided by the patient, medical records and the EMS personnel. The history is limited by the condition of the patient.       Prior to Admission medications   Medication Sig Start Date End Date Taking? Authorizing Provider  amLODipine  (NORVASC ) 5 MG tablet Take 1 tablet (5 mg total) by mouth daily. Patient not taking: Reported on 08/08/2024 03/09/24 08/08/24  Lanetta Lingo, MD  amLODipine  (NORVASC ) 5 MG tablet Take 5 mg by mouth daily.    [provider]  aspirin  EC 81 MG tablet Take 1 tablet (81 mg total) by mouth daily. Swallow whole. 03/09/24 03/09/25  Jhonny Calvin NOVAK, MD  atorvastatin  (LIPITOR) 40 MG tablet Take 1 tablet (40 mg total) by mouth daily at 6 PM. Patient taking differently: Take 40 mg by mouth at bedtime. 01/23/20   [name] Nest, DO  carvedilol  (COREG ) 6.25 MG tablet Take 6.25 mg by mouth 2 (two) times daily with a meal.    [provider]  divalproex  (DEPAKOTE  SPRINKLE) 125 MG capsule Take 125 mg by mouth 2 (two) times daily. 03/29/24   [provider]  donepezil  (ARICEPT ) 10 MG tablet Take 1 tablet (10 mg total) by mouth at bedtime. 04/06/24   Whitfield Raisin, NP  furosemide  (LASIX ) 20 MG tablet Take 40 mg by mouth in the morning.    [provider]  latanoprost  (XALATAN ) 0.005 % ophthalmic solution Place 1 drop into both eyes at bedtime.    [provider]  LORazepam  (ATIVAN ) 0.5 MG tablet Take 0.5 mg by mouth every 8 (eight) hours as needed for anxiety (or agitation). 03/25/24   [provider]  losartan  (COZAAR ) 50 MG tablet Take 50 mg by mouth 2 (two) times daily. 03/21/24   [provider]  memantine  (NAMENDA ) 10 MG tablet Take 1 tablet (10 mg total) by mouth 2 (two) times daily. 11/16/23   Whitfield Raisin, NP  metFORMIN  (GLUCOPHAGE ) 500 MG tablet Take 500 mg by mouth in the morning and at bedtime.    [provider]  Multiple Vitamins-Minerals (ONE-A-DAY MENS 50+ PO) Take 1 tablet by mouth daily with breakfast.    [provider]  QUEtiapine  (SEROQUEL ) 100 MG tablet Take 1 tablet (100 mg total) by mouth at bedtime. Patient not taking: Reported on 08/08/2024 03/08/24 08/08/24  Lanetta Lingo, MD  QUEtiapine  (SEROQUEL ) 100 MG tablet Take 100 mg by mouth at bedtime.    [provider]  REFRESH TEARS PF 0.5-0.9 % SOLN Place 1 drop into both eyes 2 (two) times daily.    [provider]  tamsulosin  (FLOMAX ) 0.4 MG CAPS capsule Take 0.4 mg by mouth at bedtime. 02/16/24   [provider]  traZODone  (DESYREL ) 150 MG tablet TAKE 1 TABLET AT BEDTIME 04/05/24   Whitfield Raisin, NP  Allergies: Other and Shellfish allergy    Review of Systems  Constitutional:  Negative for fever.  Respiratory:  Negative for shortness of breath.   Cardiovascular:  Negative for chest pain.  Gastrointestinal:  Negative for abdominal pain.  Neurological:  Negative for headaches.  Psychiatric/Behavioral:  Negative for suicidal ideas.     Updated Vital Signs BP (!) 169/82   Pulse 89   Temp 98 F (36.7 C)   Resp 17   SpO2 100%   Physical Exam Vitals and nursing note reviewed.  Constitutional:      Appearance: Normal appearance. He is well-developed.  HENT:     Head:  Atraumatic.     Nose: Nose normal.     Mouth/Throat:     Mouth: Mucous membranes are moist.     Pharynx: Oropharynx is clear.  Eyes:     General: No scleral icterus.    Conjunctiva/sclera: Conjunctivae normal.     Pupils: Pupils are equal, round, and reactive to light.  Neck:     Vascular: No carotid bruit.     Trachea: No tracheal deviation.  Cardiovascular:     Rate and Rhythm: Normal rate and regular rhythm.     Pulses: Normal pulses.     Heart sounds: Normal heart sounds. No murmur heard.    No friction rub. No gallop.  Pulmonary:     Effort: Pulmonary effort is normal. No accessory muscle usage or respiratory distress.     Breath sounds: Normal breath sounds.  Abdominal:     General: Bowel sounds are normal. There is no distension.     Palpations: Abdomen is soft.     Tenderness: There is no abdominal tenderness.  Genitourinary:    Comments: No cva tenderness. Musculoskeletal:        General: No swelling or tenderness.     Cervical back: Normal range of motion and neck supple. No rigidity.  Skin:    General: Skin is warm and dry.     Findings: No rash.  Neurological:     Mental Status: He is alert.     Comments: Alert, speech clear. Motor/sens grossly intact bil.   Psychiatric:        Mood and Affect: Mood normal.     (all labs ordered are listed, but only abnormal results are displayed) Results for orders placed or performed during the hospital encounter of 08/11/24  Rapid urine drug screen (hospital performed)   Collection Time: 08/11/24  9:00 PM  Result Value Ref Range   Opiates NONE DETECTED NONE DETECTED   Cocaine NONE DETECTED NONE DETECTED   Benzodiazepines NONE DETECTED NONE DETECTED   Amphetamines NONE DETECTED NONE DETECTED   Tetrahydrocannabinol NONE DETECTED NONE DETECTED   Barbiturates NONE DETECTED NONE DETECTED  Urinalysis, Routine w reflex microscopic -Urine, Clean Catch   Collection Time: 08/11/24  9:00 PM  Result Value Ref Range   Color,  Urine YELLOW YELLOW   APPearance CLEAR CLEAR   Specific Gravity, Urine 1.014 1.005 - 1.030   pH 6.0 5.0 - 8.0   Glucose, UA NEGATIVE NEGATIVE mg/dL   Hgb urine dipstick MODERATE (A) NEGATIVE   Bilirubin Urine NEGATIVE NEGATIVE   Ketones, ur NEGATIVE NEGATIVE mg/dL   Protein, ur NEGATIVE NEGATIVE mg/dL   Nitrite NEGATIVE NEGATIVE   Leukocytes,Ua NEGATIVE NEGATIVE   RBC / HPF 6-10 0 - 5 RBC/hpf   WBC, UA 0-5 0 - 5 WBC/hpf   Bacteria, UA NONE SEEN NONE SEEN   Squamous Epithelial / HPF  0-5 0 - 5 /HPF  Comprehensive metabolic panel   Collection Time: 08/11/24 10:08 PM  Result Value Ref Range   Sodium 143 135 - 145 mmol/L   Potassium 3.8 3.5 - 5.1 mmol/L   Chloride 107 98 - 111 mmol/L   CO2 28 22 - 32 mmol/L   Glucose, Bld 135 (H) 70 - 99 mg/dL   BUN 21 8 - 23 mg/dL   Creatinine, Ser 8.78 0.61 - 1.24 mg/dL   Calcium  9.1 8.9 - 10.3 mg/dL   Total Protein 7.3 6.5 - 8.1 g/dL   Albumin 3.3 (L) 3.5 - 5.0 g/dL   AST 14 (L) 15 - 41 U/L   ALT 10 0 - 44 U/L   Alkaline Phosphatase 50 38 - 126 U/L   Total Bilirubin 0.7 0.0 - 1.2 mg/dL   GFR, Estimated >39 >39 mL/min   Anion gap 8 5 - 15  Ethanol   Collection Time: 08/11/24 10:08 PM  Result Value Ref Range   Alcohol , Ethyl (B) <15 <15 mg/dL  cbc   Collection Time: 08/11/24 10:08 PM  Result Value Ref Range   WBC 5.1 4.0 - 10.5 K/uL   RBC 4.29 4.22 - 5.81 MIL/uL   Hemoglobin 12.1 (L) 13.0 - 17.0 g/dL   HCT 63.4 (L) 60.9 - 47.9 %   MCV 85.1 80.0 - 100.0 fL   MCH 28.2 26.0 - 34.0 pg   MCHC 33.2 30.0 - 36.0 g/dL   RDW 85.3 88.4 - 84.4 %   Platelets 198 150 - 400 K/uL   nRBC 0.0 0.0 - 0.2 %   CT Head Wo Contrast Result Date: 08/11/2024 CLINICAL DATA:  Mental status change, unknown cause hx fall with small sdh, r/o new/acute process. EXAM: CT HEAD WITHOUT CONTRAST TECHNIQUE: Contiguous axial images were obtained from the base of the skull through the vertex without intravenous contrast. RADIATION DOSE REDUCTION: This exam was performed  according to the departmental dose-optimization program which includes automated exposure control, adjustment of the mA and/or kV according to patient size and/or use of iterative reconstruction technique. COMPARISON:  07/15/2024 FINDINGS: Brain: Previously seen subdural hemorrhage along the falx and tentorium has resolved. Old right MCA infarct with encephalomalacia. There is atrophy and chronic small vessel disease changes. No acute intracranial abnormality. Specifically, no hemorrhage, hydrocephalus, mass lesion, acute infarction, or significant intracranial injury. Vascular: No hyperdense vessel or unexpected calcification. Skull: No acute calvarial abnormality. 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 Resolution of previously seen subdural hematoma Atrophy, chronic microvascular disease. No acute intracranial abnormality. Electronically Signed   By: Franky Crease M.D.   On: 08/11/2024 21:16   CUP PACEART REMOTE DEVICE CHECK Result Date: 07/21/2024 ILR summary report received. Battery status OK. Normal device function. No new symptom, tachy, brady, or pause episodes. No new AF episodes. Monthly summary reports and ROV/PRN. MC, CVRS  CT Cervical Spine Wo Contrast Result Date: 07/15/2024 CLINICAL DATA:  Neck trauma (Age >= 65y) EXAM: CT CERVICAL SPINE WITHOUT CONTRAST TECHNIQUE: Multidetector CT imaging of the cervical spine was performed without intravenous contrast. Multiplanar CT image reconstructions were also generated. RADIATION DOSE REDUCTION: This exam was performed according to the departmental dose-optimization program which includes automated exposure control, adjustment of the mA and/or kV according to patient size and/or use of iterative reconstruction technique. COMPARISON:  05/07/2023 FINDINGS: Alignment: No subluxation Skull base and vertebrae: No acute fracture. No primary bone lesion or focal pathologic process. Soft  tissues and  spinal canal: No prevertebral fluid or swelling. No visible canal hematoma. Disc levels: Large flowing anterior osteophytes from C4-C7. Moderate bilateral degenerative facet disease, left greater than right. Upper chest: No acute findings Other: None IMPRESSION: Degenerative disc and facet disease.  No acute bony abnormality. Electronically Signed   By: Franky Crease M.D.   On: 07/15/2024 17:11   CT Head Wo Contrast Result Date: 07/15/2024 CLINICAL DATA:  Head trauma, minor (Age >= 65y) EXAM: CT HEAD WITHOUT CONTRAST TECHNIQUE: Contiguous axial images were obtained from the base of the skull through the vertex without intravenous contrast. RADIATION DOSE REDUCTION: This exam was performed according to the departmental dose-optimization program which includes automated exposure control, adjustment of the mA and/or kV according to patient size and/or use of iterative reconstruction technique. COMPARISON:  02/27/2024 FINDINGS: Brain: High-density blood noted along the falx measuring up to 5 mm as well as along the left tentorium compatible with subdural hematoma. Old right temporoparietal infarct with encephalomalacia, stable. No mass effect or midline shift. No hydrocephalus. No intraparenchymal hemorrhage. Vascular: No hyperdense vessel or unexpected calcification. Skull: No acute calvarial abnormality. Sinuses/Orbits: No acute findings Other: Traumatic Brain Injury Risk Stratification Skull Fracture: No - Low/mBIG 1 Subdural Hematoma (SDH): 4mm to <26mm - mBIG 2 Subarachnoid Hemorrhage [facility]): No Epidural Hematoma (EDH): No - Low/mBIG 1 Cerebral contusion, intra-axial, intraparenchymal Hemorrhage (IPH): No Intraventricular Hemorrhage (IVH): No - Low/mBIG 1 Midline Shift > 1mm or Edema/effacement of sulci/vents: No - Low/mBIG 1 ---------------------------------------------------- IMPRESSION: 5 mm subdural hematoma along the falx and left tentorium. No mass effect or midline shift. Old right MCA infarct  with encephalomalacia. These results were called by telephone at the time of interpretation on 07/15/2024 at 5:07 pm to provider [facility] , who verbally acknowledged these results. Electronically Signed   By: Franky Crease M.D.   On: 07/15/2024 17:07   DG Pelvis 1-2 Views Result Date: 07/15/2024 CLINICAL DATA:  fall EXAM: PELVIS - 1-2 VIEW COMPARISON:  None Available. FINDINGS: No evidence of pelvic fracture or diastasis.No acute hip fracture or dislocation.Mild joint space loss of both hips.Chunky calcification in the midline pelvis, likely dystrophic calcification in the prostate gland. Degenerative disc disease of the spine. IMPRESSION: No acute fracture, pelvic bone diastasis, or dislocation. Electronically Signed   By: Rogelia Myers M.D.   On: 07/15/2024 16:56   DG Chest 2 View Result Date: 07/15/2024 CLINICAL DATA:  Pain after fall EXAM: CHEST - 2 VIEW COMPARISON:  X-ray 02/27/2024. FINDINGS: Normal cardiopericardial silhouette calcified tortuous aorta. Prominence of central vasculature. Presumed loop recorder overlying left hemithorax. No consolidation, pneumothorax or effusion. No edema. Lateral view is under penetrated and the posteroinferior costophrenic angles are clipped off the edge of the film. Mild degenerative changes along the spine. Although oral focus further concern of the injury additional cross-sectional study could be considered as clinically appropriate for further delineation. IMPRESSION: Prominent central vasculature. No acute cardiopulmonary disease. Loop recorder. Electronically Signed   By: Ranell Bring M.D.   On: 07/15/2024 16:52    EKG: None  Radiology: CT Head Wo Contrast Result Date: 08/11/2024 CLINICAL DATA:  Mental status change, unknown cause hx fall with small sdh, r/o new/acute process. EXAM: CT HEAD WITHOUT CONTRAST TECHNIQUE: Contiguous axial images were obtained from the base of the skull through the vertex without intravenous contrast. RADIATION DOSE  REDUCTION: This exam was performed according to the departmental dose-optimization program which includes automated exposure control, adjustment of the mA and/or kV according to patient size and/or  use of iterative reconstruction technique. COMPARISON:  07/15/2024 FINDINGS: Brain: Previously seen subdural hemorrhage along the falx and tentorium has resolved. Old right MCA infarct with encephalomalacia. There is atrophy and chronic small vessel disease changes. No acute intracranial abnormality. Specifically, no hemorrhage, hydrocephalus, mass lesion, acute infarction, or significant intracranial injury. Vascular: No hyperdense vessel or unexpected calcification. Skull: No acute calvarial abnormality. 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 Resolution of previously seen subdural hematoma Atrophy, chronic microvascular disease. No acute intracranial abnormality. Electronically Signed   By: Franky Crease M.D.   On: 08/11/2024 21:16     Procedures   Medications Ordered in the ED - No data to display                                  Medical Decision Making Problems Addressed: Acute alteration in mental status: acute illness or injury with systemic symptoms Chronic dementia (HCC): chronic illness or injury with exacerbation, progression, or side effects of treatment that poses a threat to life or bodily functions Dementia with behavioral disturbance (HCC): chronic illness or injury with exacerbation, progression, or side effects of treatment that poses a threat to life or bodily functions Elevated blood pressure reading: acute illness or injury Essential hypertension: chronic illness or injury with exacerbation, progression, or side effects of treatment that poses a threat to life or bodily functions  Amount and/or Complexity of Data Reviewed Independent Historian: EMS    Details: hx External Data Reviewed: notes. Labs:  ordered. Decision-making details documented in ED Course. Radiology: ordered and independent interpretation performed. Decision-making details documented in ED Course. Discussion of management or test interpretation with external provider(s): TOC, BH  Risk Prescription drug management. Decision regarding hospitalization.   Iv ns. Continuous pulse ox and cardiac monitoring. Labs ordered/sent. Imaging ordered.   Differential diagnosis includes dementia, uti, etc. . Dispo decision including potential need for admission considered - will get labs and imaging and reassess.   Reviewed nursing notes and prior charts for additional history. External reports reviewed. Additional history from: EMS.   Cardiac monitor: sinus rhythm, rate 88.  Labs reviewed/interpreted by me - wbc 5, hgb 12. UA neg for uti. Chem unremarkable.   CT reviewed/interpreted by me - no acute hem.   Will consult toc team re: placement/disposition.  Note that existing state regulations are clear that if/when a SNF/ALF wishes to d/c a current resident from their facility they must provided adequate notice (minimum 30 days), and work with patient/family/guardian during that time to locate new facility (exception being if facility filed assault or similar charges with the court) - will ask TOC team to look into, contact facility, and if appropriate steps not followed, file grievance on patients behalf with the state.  Pharmacy/med rec pending - once complete home meds can be re-ordered.   Dispo per [facility] team. Signed out to oncoming EDP.       Final diagnoses:  Acute alteration in mental status  Chronic dementia (HCC)  Dementia with behavioral disturbance (HCC)  Elevated blood pressure reading  Essential hypertension    ED Discharge Orders     None         Bernard Franky, MD 08/11/24 2311

## 2024-08-11 NOTE — ED Provider Notes (Signed)
 TOC boarding. No needs at this time.   Apparently patient had been IVC'ed by outside facility. 24 hour exam completed. No other needs at this time.   Param Capri, Selinda, MD 08/12/24 [phone]
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
## 2024-08-12 LAB — CBG MONITORING, ED
Glucose-Capillary: 76 mg/dL (ref 70–99)
Glucose-Capillary: 97 mg/dL (ref 70–99)

## 2024-08-12 MED ORDER — STERILE WATER FOR INJECTION IJ SOLN
INTRAMUSCULAR | Status: AC
  Administered 2024-08-12: 1.2 mL
  Filled 2024-08-12: qty 10

## 2024-08-12 MED ORDER — ZIPRASIDONE MESYLATE 20 MG IM SOLR
10.0000 mg | Freq: Once | INTRAMUSCULAR | Status: AC
  Administered 2024-08-12: 10 mg via INTRAMUSCULAR
  Filled 2024-08-12: qty 20
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
## 2024-08-12 NOTE — ED Notes (Signed)
 Pt escorted to restroom after coming to his door of the room stating he needed to use the restroom. Pt was assisted to RR and began to pull a scab and bandage off his leg, when requested to stop, pt began hitting the paper dispenser and yelling yall not going to tell me what to do pt has a scab bleeding on his L shin, pt refuses to let this RN tend to the wound.

## 2024-08-12 NOTE — ED Notes (Signed)
 Family requesting contact Beverly(daughter) first for any update or info, if they are unavailable, try Syltina (daughter), or Flossie (sister) when SW or CM call for any info.

## 2024-08-12 NOTE — ED Provider Notes (Signed)
 Emergency Medicine Observation Re-evaluation Note  [name] is a 73 y.o. male, seen on rounds today.  Pt initially presented to the ED for complaints of Dementia Currently, the patient is resting quietly.  Physical Exam  BP (!) 175/90 (BP Location: Right Arm)   Pulse 70   Temp 98.1 F (36.7 C) (Oral)   Resp 17   SpO2 99%  Physical Exam General: No acute distress Cardiac: Well-perfused Lungs: Nonlabored Psych: Unable to assess  ED Course / MDM  EKG:   I have reviewed the labs performed to date as well as medications administered while in observation.  Recent changes in the last 24 hours include patient has been psychiatrically cleared.  Social work has been consulted..  Plan  Current plan is for social work working on placement.  Currently patient has sitter as has been agitated intermittently.  Home meds appear to be written for  4:13 PM.  Called the patient's room as he becoming increasingly agitated and threatening staff.  I have ordered him a dose of Geodon .  They are trying to verbally de-escalate him.   [name] Ozell BROCKS, MD 08/12/24 [phone]

## 2024-08-12 NOTE — ED Notes (Signed)
 Patient clothes is in [place] [address]. There are 2 belonging bags

## 2024-08-13 MED ORDER — ZIPRASIDONE MESYLATE 20 MG IM SOLR
20.0000 mg | Freq: Once | INTRAMUSCULAR | Status: AC
  Administered 2024-08-13: 20 mg via INTRAMUSCULAR
  Filled 2024-08-13: qty 20

## 2024-08-13 MED ORDER — STERILE WATER FOR INJECTION IJ SOLN
INTRAMUSCULAR | Status: AC
Start: 2024-08-13 — End: 2024-08-13
  Administered 2024-08-13: 10 mL
  Filled 2024-08-13: qty 10

## 2024-08-13 NOTE — ED Notes (Signed)
 IVC'd 08/11/24, exp 08/18/24
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
## 2024-08-13 NOTE — ED Notes (Signed)
 Patient IVC paperwork completed Original copy in red folder, 3 copies on clipboard with secretary in green zone 1 copy in medical records Envelope wn#[id number]
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
## 2024-08-14 NOTE — ED Notes (Signed)
Pt ambulated to the RR.  

## 2024-08-14 NOTE — ED Provider Notes (Signed)
 Emergency Medicine Observation Re-evaluation Note  Kejuan Bekker is a 73 y.o. male, seen on rounds today.  Pt initially presented to the ED for complaints of Dementia Currently, the patient is resting  Physical Exam  BP (!) 148/88 (BP Location: Left Arm)   Pulse 85   Temp (!) 97.5 F (36.4 C) (Oral)   Resp 16   SpO2 99%  Physical Exam General: nad Cardiac: rr Lungs: non labored Psych: calm  ED Course / MDM  EKG:   I have reviewed the labs performed to date as well as medications administered while in observation.  Recent changes in the last 24 hours include none.  Plan  Current plan is for placement/TOC.    Neysa Caron PARAS, DO 08/14/24 [phone]

## 2024-08-15 NOTE — Progress Notes (Shared)
 [facility] - Clinic Note  08/29/2024     CHIEF COMPLAINT Patient presents for No chief complaint on file.   HISTORY OF PRESENT ILLNESS: [name] is a 73 y.o. male who presents to the clinic today for:      Pt states he is doing fine.    Referring physician: Tilford Burden, PA-C [address] [place],  [place] [address]  HISTORICAL INFORMATION:   Selected notes from the MEDICAL RECORD NUMBER Referred by Dr. Glendia Gaudy for concern of HTN Ret   CURRENT MEDICATIONS: No current facility-administered medications for this visit. (Ophthalmic Drugs)   Current Outpatient Medications (Ophthalmic Drugs)  Medication Sig   latanoprost  (XALATAN ) 0.005 % ophthalmic solution Place 1 drop into both eyes at bedtime.   REFRESH TEARS PF 0.5-0.9 % SOLN Place 1 drop into both eyes 2 (two) times daily.   Facility-Administered Medications Ordered in Other Visits (Ophthalmic Drugs)  Medication Route   latanoprost  (XALATAN ) 0.005 % ophthalmic solution 1 drop Both Eyes   No current facility-administered medications for this visit. (Other)   Current Outpatient Medications (Other)  Medication Sig   amLODipine  (NORVASC ) 5 MG tablet Take 5 mg by mouth daily.   aspirin  EC 81 MG tablet Take 1 tablet (81 mg total) by mouth daily. Swallow whole.   atorvastatin  (LIPITOR) 40 MG tablet Take 1 tablet (40 mg total) by mouth daily at 6 PM. (Patient taking differently: Take 40 mg by mouth at bedtime.)   carvedilol  (COREG ) 6.25 MG tablet Take 6.25 mg by mouth 2 (two) times daily with a meal.   divalproex  (DEPAKOTE  SPRINKLE) 125 MG capsule Take 125 mg by mouth 2 (two) times daily.   donepezil  (ARICEPT ) 10 MG tablet Take 1 tablet (10 mg total) by mouth at bedtime.   furosemide  (LASIX ) 20 MG tablet Take 40 mg by mouth in the morning.   LORazepam  (ATIVAN ) 0.5 MG tablet Take 0.5 mg by mouth every 8 (eight) hours.   losartan  (COZAAR ) 50 MG tablet Take 50 mg by mouth 2 (two) times  daily.   memantine  (NAMENDA ) 10 MG tablet Take 1 tablet (10 mg total) by mouth 2 (two) times daily.   metFORMIN  (GLUCOPHAGE ) 500 MG tablet Take 500 mg by mouth in the morning and at bedtime.   Multiple Vitamins-Minerals (ONE-A-DAY MENS 50+ PO) Take 1 tablet by mouth daily with breakfast.   QUEtiapine  (SEROQUEL ) 100 MG tablet Take 100 mg by mouth at bedtime.   tamsulosin  (FLOMAX ) 0.4 MG CAPS capsule Take 0.4 mg by mouth at bedtime.   traZODone  (DESYREL ) 150 MG tablet TAKE 1 TABLET AT BEDTIME   Facility-Administered Medications Ordered in Other Visits (Other)  Medication Route   amLODipine  (NORVASC ) tablet 5 mg Oral   aspirin  EC tablet 81 mg Oral   atorvastatin  (LIPITOR) tablet 40 mg Oral   carvedilol  (COREG ) tablet 6.25 mg Oral   divalproex  (DEPAKOTE  SPRINKLE) capsule 125 mg Oral   donepezil  (ARICEPT ) tablet 10 mg Oral   memantine  (NAMENDA ) tablet 10 mg Oral   metFORMIN  (GLUCOPHAGE ) tablet 500 mg Oral   QUEtiapine  (SEROQUEL ) tablet 100 mg Oral   tamsulosin  (FLOMAX ) capsule 0.4 mg Oral   REVIEW OF SYSTEMS:     ALLERGIES Allergies  Allergen Reactions   Other Swelling, Rash and Other (See Comments)    Aftershave - Daughter to find out specific name/product and update at that time.  Caused total facial swelling.   Shellfish Allergy Anaphylaxis, Hives, Itching and Rash   PAST MEDICAL HISTORY  Past Medical History:  Diagnosis Date   Acute cerebrovascular accident (CVA) (HCC) 01/20/2020   Benign essential HTN 01/20/2020   Cataract    Mixed form OU   Dementia (HCC)    Diabetic retinopathy (HCC)    NPDR OU   Hypertensive retinopathy    OU   Seizures (HCC)    per pt- none since teenage years   Past Surgical History:  Procedure Laterality Date   LOOP RECORDER INSERTION N/A 01/23/2020   Procedure: LOOP RECORDER INSERTION;  Surgeon: Fernande Elspeth BROCKS, MD;  Location: [facility] INVASIVE CV LAB;  Service: Cardiovascular;  Laterality: N/A;   FAMILY HISTORY Family History  Problem  Relation Age of Onset   COPD Mother    Diabetes Mother    Dementia Father    Diabetes Father    Cancer Father        prostate and lung   Colon cancer Neg Hx    Esophageal cancer Neg Hx    Stomach cancer Neg Hx    Rectal cancer Neg Hx    SOCIAL HISTORY Social History   Tobacco Use   Smoking status: Never   Smokeless tobacco: Never  Vaping Use   Vaping status: Never Used  Substance Use Topics   Alcohol  use: Never   Drug use: Never       OPHTHALMIC EXAM: Not recorded    IMAGING AND PROCEDURES  Imaging and Procedures for @TODAY @          ASSESSMENT/PLAN:    ICD-10-CM   1. Moderate nonproliferative diabetic retinopathy of both eyes with macular edema associated with type 2 diabetes mellitus (HCC)  Z88.6686     2. Diabetes mellitus treated with oral medication (HCC)  E11.9    Z79.84     3. Retinal hole of right eye  H33.321     4. Essential hypertension  I10     5. Hypertensive retinopathy of both eyes  H35.033     6. Pseudophakia, both eyes  Z96.1     7. Dry eyes  H04.123        1,2. Moderate non-proliferative diabetic retinopathy, OU  - delayed follow up from 8 weeks to 12.5 weeks (01.15.25 to 04.14.25)  - delayed follow up from 7 weeks to 9 weeks (09.30.24 to 11.20.24)  - s/p IVA OD #1 (09.16.24), #2 (11.20.24), #3 (01.14.25), #4 (04.14.25), #5 (06.02.25), #5 (7.21.25) - s/p IVA OS #1 (05.07.21), #2 (06.04.21), #3 (07.06.21), #4 (08.03.21), #5 (08.31.21), #6 (9.28.21), #7 (11.2.21), #8 (12.07.21), #9 (02.02.22), #10 (03.30.22), #11 (05.25.22), #12 (08.03.22), #13 (10.20.22), #14 (01.12.23), #15 (01.22.24), #16 (03.11.24), #17 (03.11.24), #18 (04.22.24), #19 (06.03.24), #20 (07.15.24), #21 (04.14.25), #22 (06.02.25), #23 (7.21.25) ================ - s/p IVE OS #1 (08.19.24), #2 (09.16.24), #3 (11.20.24), #4 (sample -- 01.14.25)  - s/p PRP OS (04.09.21) -- good laser changes - s/p PRP OD (02.05.24) for peripheral vascular nonperfusion -- good laser  changes - repeat FA (09.28.21) shows late leaking MA OU, significant capillary drop-out  - repeat FA (01.23.24) shows OD: Scattered patches of vascular non-perfusion; large area of vascular non perfusion temporally, no NV; OS: delayed filling; scattered patches of vascular non perfusion peripherally, staining of 360 laser, scattered leaking MA -- most prominent IT to fovea, no NV  - exam shows scattered MA/IRH/CWS OU -- improving; new vitreous opacities OU improved  - BCVA OD 20/70 from 20/80, OS stable at 20/30  - OCT shows OD: NFP, no IRF / SRF -- mild diffuse retinal thinning -- stable, mild interval  improvement in scattered cystic changes, persistent vitreous opacities; OS: NFP, no SRF, Persistent IRF / IRHM temporal fovea and mac, +vitreous opacities -- improved at 7 weeks - recommend IVA OU (OD #7 and OS #24) today, 9.08.25 with f/u at 7 weeks again - [employer] funding unavailable at this time - RBA of procedure discussed, questions answered - see procedure note - IVA informed consent obtained and signed OD (09.16.24) - IVE informed consent obtained and signed OS (08.19.24)  - f/u 7 weeks, DFE, OCT, possible injection(s)  3. Operculated retinal hole w/ cuff of SRF / focal RD, right eye - operculated hole located at 1000 with partial pigment and +cuff of SRF / focal RD  - s/p retinopexy OD (03.17.21) -- good laser changes surrounding  - stable -- monitor  4,5. Hypertensive retinopathy OU  - discussed importance of tight BP control  - monitor  6. Pseudophakia OU  - s/p CE/IOL (Dr. CANDIE Gaudy, OD: 07.02.24)  - IOL in good position, doing well  - monitor  7. Dry eyes OU  - recommend artificial tears and lubricating ointment as needed  - monitor  Ophthalmic Meds Ordered this visit:  No orders of the defined types were placed in this encounter.    No follow-ups on file.  There are no Patient Instructions on file for this visit.  Explained the diagnoses, plan, and follow up with  the patient and they expressed understanding.  Patient expressed understanding of the importance of proper follow up care.   This document serves as a record of services personally performed by Redell JUDITHANN Hans, MD, PhD. It was created on their behalf by Avelina Pereyra, COA an ophthalmic technician. The creation of this record is the provider's dictation and/or activities during the visit.   Electronically signed by: Avelina GORMAN Pereyra, COT  08/15/24  10:35 AM      Redell JUDITHANN Hans, M.D., Ph.D. Diseases & Surgery of the Retina and Vitreous [facility]    Abbreviations: M myopia (nearsighted); A astigmatism; H hyperopia (farsighted); P presbyopia; Mrx spectacle prescription;  CTL contact lenses; OD right eye; OS left eye; OU both eyes  XT exotropia; ET esotropia; PEK punctate epithelial keratitis; PEE punctate epithelial erosions; DES dry eye syndrome; MGD meibomian gland dysfunction; ATs artificial tears; PFAT's preservative free artificial tears; NSC nuclear sclerotic cataract; PSC posterior subcapsular cataract; ERM epi-retinal membrane; PVD posterior vitreous detachment; RD retinal detachment; DM diabetes mellitus; DR diabetic retinopathy; NPDR non-proliferative diabetic retinopathy; PDR proliferative diabetic retinopathy; CSME clinically significant macular edema; DME diabetic macular edema; dbh dot blot hemorrhages; CWS cotton wool spot; POAG primary open angle glaucoma; C/D cup-to-disc ratio; HVF humphrey visual field; GVF goldmann visual field; OCT optical coherence tomography; IOP intraocular pressure; BRVO Branch retinal vein occlusion; CRVO central retinal vein occlusion; CRAO central retinal artery occlusion; BRAO branch retinal artery occlusion; RT retinal tear; SB scleral buckle; PPV pars plana vitrectomy; VH Vitreous hemorrhage; PRP panretinal laser photocoagulation; IVK intravitreal kenalog; VMT vitreomacular traction; MH Macular hole;  NVD neovascularization of the  disc; NVE neovascularization elsewhere; AREDS age related eye disease study; ARMD age related macular degeneration; POAG primary open angle glaucoma; EBMD epithelial/anterior basement membrane dystrophy; ACIOL anterior chamber intraocular lens; IOL intraocular lens; PCIOL posterior chamber intraocular lens; Phaco/IOL phacoemulsification with intraocular lens placement; PRK photorefractive keratectomy; LASIK laser assisted in situ keratomileusis; HTN hypertension; DM diabetes mellitus; COPD chronic obstructive pulmonary disease

## 2024-08-15 NOTE — ED Notes (Signed)
 Rescind Notice of Commitment Change form completed by Dr. CANDIE Pereyra.  The rescind notice has been uploaded to the patient's chart, e-filed to the [facility] of [address] and recorded in the e-ivc binder. The original was placed in the red folder and the medical records drawer in the green zone.
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
## 2024-08-15 NOTE — ED Notes (Signed)
 This RN assumed care of this PT at this time

## 2024-08-15 NOTE — Progress Notes (Addendum)
 12:25pm: CSW received call from [name] of [facility] DSS who states he is assigned to the case and will be coming to the hospital to visit patient.  10:19am: CSW received return call from [place] at [facility] DSS who states the report was accepted and will be assigned to a Child psychotherapist.  8:30am: CSW spoke with ED leadership via secure chat regarding patient. Per Dr. Jayson Pereyra, he spoke with patient's daughter who is continuing to refuse to come to pick up patient as she was advised by someone that she does not legally have to come and pick up patient. ED leadership advised CSW to make an APS report due to abandonment and to file a grievance with the state due to facility issuing an immediate discharge without an alternative placement option.  CSW spoke with Crystal, CM at [facility] who states an immediate discharge was given to patient's daughter [name] on Thursday 08/11/24 and that the facility did not have to allow 30 days for family to obtain new placement.  CSW spoke with [place] at [facility] DSS to make an APS report.  CSW spoke with Richerd Sieving, nurse consultant at [facility] to file a grievance against [facility]. [place] states the information that was reported will be investigated.  [name], MSW, LCSW Transitions of Care  Clinical Social Worker II [phone]

## 2024-08-15 NOTE — ED Notes (Addendum)
 PT came to the nurses station to ask if we had  a restroom he could use. Once directed to the restroom,PT ambulated w/o difficulty and returned to his bed.

## 2024-08-15 NOTE — ED Provider Notes (Addendum)
 Emergency Medicine Observation Re-evaluation Note  [name] is a 73 y.o. male, seen on rounds today.  Pt initially presented to the ED for complaints of Dementia Currently, the patient is resting comfortably.  Physical Exam  BP (!) 162/96 (BP Location: Right Arm)   Pulse 75   Temp 98.4 F (36.9 C) (Oral)   Resp 18   SpO2 100%  Physical Exam General: NAD Cardiac: Extremities well-perfused Lungs: No respiratory distress Psych: Calm  ED Course / MDM  EKG:   I have reviewed the labs performed to date as well as medications administered while in observation.  Recent changes in the last 24 hours include no acute changes. Patient has been medically cleared by prior team. Spoke with patient's daughter [name] Goode/emergency contact at the contact number listed below.  She reports that she will not come pick up the patient because she does not feels safe with him at home. I informed patient's daughter that he was discharged and she needed to come pick him up and she is refusing.  Daughter reports that she is going to file Medicaid paperwork today. I spoke with social work who will file APS report for abandonment and also filed a Theatre manager for [employer] for not providing adequate notice for discharge. This conversation was relayed to ED leadership    [name] [place] Daughter Emergency Contact [phone] [address] [place] [address]   Plan  Current plan is TBD, ideally discharge home or back to facility     [name] LABOR, DO 08/15/24 0904    [name] LABOR, DO 08/15/24 [phone]

## 2024-08-15 NOTE — Progress Notes (Addendum)
 [address] Adm called, [address] awaiting call back. KATHEE Herring [facility] Inpatient Care Management  Supervisor [facility] [phone]  3:12 pm- telephone call to [facility], [facility] has left for the day; will follow up in the am. B Herring RN,MHA,CCM

## 2024-08-16 MED ORDER — LORAZEPAM 2 MG/ML IJ SOLN
1.0000 mg | Freq: Once | INTRAMUSCULAR | Status: DC
  Filled 2024-08-16: qty 1

## 2024-08-16 MED ORDER — HALOPERIDOL LACTATE 5 MG/ML IJ SOLN
3.0000 mg | Freq: Once | INTRAMUSCULAR | Status: DC
  Filled 2024-08-16: qty 1
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
## 2024-08-16 NOTE — ED Provider Notes (Signed)
 Emergency Medicine Observation Re-evaluation Note  [name] is a 73 y.o. male, seen on rounds today.  Pt initially presented to the ED for complaints of Dementia Currently, the patient is resting.  Physical Exam  BP (!) 161/65 (BP Location: Right Arm)   Pulse 62   Temp 97.9 F (36.6 C) (Oral)   Resp 17   SpO2 99%  Physical Exam General: resting Lungs: normal effort Psych: no agitation  ED Course / MDM  EKG:   I have reviewed the labs performed to date as well as medications administered while in observation.  No recent changes in the last 24 hours.  Plan  Current plan is for placement.    Freddi Hamilton, MD 08/16/24 [phone]

## 2024-08-17 LAB — CBG MONITORING, ED
Glucose-Capillary: 150 mg/dL — ABNORMAL HIGH (ref 70–99)
Glucose-Capillary: 112 mg/dL — ABNORMAL HIGH (ref 70–99)
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
## 2024-08-18 NOTE — ED Provider Notes (Signed)
 Emergency Medicine Observation Re-evaluation Note  Marcellino Fidalgo is a 73 y.o. male, seen on rounds today.  Pt initially presented to the ED for complaints of Dementia Currently, the patient is asleep.  Physical Exam  BP (!) 152/75 (BP Location: Right Arm)   Pulse (!) 58   Temp 97.8 F (36.6 C) (Axillary)   Resp 14   SpO2 99%  Physical Exam General: asleep Cardiac: asleep Lungs: asleep Psych: asleep  ED Course / MDM  EKG:   I have reviewed the labs performed to date as well as medications administered while in observation.  No recent changes in the last 24 hours.  Plan  Current plan is for placement.    Freddi Hamilton, MD 08/18/24 [phone]

## 2024-08-18 NOTE — Progress Notes (Signed)
 CSW received call from [place] from [facility] who states per the team leader, [facility] was within its rights as a facility to immediately discharge patient without a 30 day notice due to his behaviors and is not required to take patient back.  [name], MSW, LCSW Transitions of Care  Clinical Social Worker II [phone]

## 2024-08-19 NOTE — ED Provider Notes (Signed)
 Pt with hx dementia with intermittent behavioral symptoms.   Instead of managing patients dementia related behaviors effectively, patients SNF instead gave patient/family notice of immediate discharge (without required notice period) and sent patient to ED, refusing return to their facility.  Pt remains in ED on oral meds, without acute medical or psychiatric issues.  No new c/o this AM. Vitals stable. Awake, alert, content appearing.   Dispo per [facility]  team.      Bernard Drivers, MD 08/19/24 [phone]

## 2024-08-19 NOTE — ED Notes (Signed)
 Staffing does not have a replacement sitter for the patient.

## 2024-08-20 NOTE — Progress Notes (Addendum)
 2:30pm: CSW received return call from patient's daughter [name] who states she has not completed guardianship paperwork. [name] states she spoke with [name] on 08/17/24 to discuss what the appropriate level of care is for the patient. [name] states patient was supposed to be evaluated to determine if patient needs SNF or not. [name] states she is dissatisfied with the contradicting messaging she has received from [facility] staff during patient's hospitalization. CSW informed [name] that CSW has not worked on placement as CSW was advised by APS worker Astrid Ufot of [facility]) that she was working on new placement. [name] states she wants patient placed into a SNF. CSW informed [name] that patient will have to be evaluated by PT for their recommendations as it is required for insurance authorization. CSW explained potential barriers to SNF placement including patient's history of documented aggressive behaviors and continued Recruitment consultant.  CSW spoke with Dr. Yolande to request a PT order be placed.  1:30pm: CSW attempted to reach patient's daughter [name] without success - a voicemail was left requesting a return call.  [name], MSW, LCSW Transitions of Care  Clinical Social Worker II [phone]

## 2024-08-20 NOTE — ED Provider Notes (Signed)
 Emergency Medicine Observation Re-evaluation Note  [name] is a 73 y.o. male, seen on rounds today.  Pt initially presented to the ED for complaints of Dementia Currently, the patient is awaiting Senior nursing facility placement.  Physical Exam  BP (!) 157/86   Pulse 69   Temp 98.3 F (36.8 C) (Oral)   Resp 16   SpO2 98%  Physical Exam General: Nontoxic no acute distress Cardiac:  Lungs: No respiratory Psych: Baseline  ED Course / MDM  EKG:   I have reviewed the labs performed to date as well as medications administered while in observation.  Recent changes in the last 24 hours include nothing significant.  Plan  Current plan is for social worker working on nursing facility placement at the request of the family..    Trinton Prewitt, MD 08/20/24 [phone]
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
## 2024-08-21 NOTE — ED Notes (Signed)
PT family at bedside.

## 2024-08-21 NOTE — ED Provider Notes (Signed)
 Emergency Medicine Observation Re-evaluation Note  [name] is a 73 y.o. male, seen on rounds today.  Pt initially presented to the ED for complaints of dementia with intermittent related behaviors. Currently resting, no distress, no new c/o.   Physical Exam  BP (!) 146/59 (BP Location: Left Arm)   Pulse (!) 59   Temp 98.7 F (37.1 C) (Oral)   Resp 16   SpO2 99%  Physical Exam General: resting.  Cardiac: regular rate.  Lungs: breathing comfortably. Psych: calm.  ED Course / MDM    I have reviewed the labs performed to date as well as medications administered while in observation.  Recent changes in the last 24 hours include ED obs, reassessment.   Plan  It appears based on history, behaviors, and period of ED obs, that patient should have rightly returned to his facility, as it was his legal residence at the time, his behaviors were c/w his condition/baseline, and he was not given appropriate required notice of discharge.  That notwithstanding, pt remains in ED setting awaiting TOC placement.  Despite boarding in ED 227 hours, there has been little movement towards new placement - TOC team  to expedite efforts for placement of patient.       Bernard Drivers, MD 08/21/24 [phone]

## 2024-08-22 ENCOUNTER — Ambulatory Visit

## 2024-08-22 DIAGNOSIS — R55 Syncope and collapse: Secondary | ICD-10-CM

## 2024-08-22 NOTE — ED Provider Notes (Signed)
 Emergency Medicine Observation Re-evaluation Note  [name] is a 73 y.o. male, seen on rounds today.  Pt initially presented to the ED for complaints of Dementia Currently, the patient is in no acute distress, resting comfortably. No acute events overnight   Physical Exam  BP (!) 167/80 (BP Location: Right Arm)   Pulse 64   Temp 98.2 F (36.8 C) (Oral)   Resp 16   SpO2 97%  Physical Exam General: NAD Lungs: No respiratory distress Psych: calm, pleasant  ED Course / MDM  EKG:   I have reviewed the labs performed to date as well as medications administered while in observation.  Recent changes in the last 24 hours include waiting for [facility] placement  Plan  Current plan is for waiting for [facility] team for placement.     [name] Duwaine CROME, DO 08/22/24 [phone]

## 2024-08-23 LAB — CBG MONITORING, ED
Glucose-Capillary: 103 mg/dL — ABNORMAL HIGH (ref 70–99)
Glucose-Capillary: 149 mg/dL — ABNORMAL HIGH (ref 70–99)

## 2024-08-23 NOTE — ED Notes (Signed)
 Patient became aggravated about not being able to get to his dog. Patient was able to be redirected with a meal and a beverage. Patient is currently eating and has calmed down for the moment.

## 2024-08-23 NOTE — ED Notes (Signed)
 4 ostomy bags were order from materials. Patient change bag

## 2024-08-23 NOTE — ED Provider Notes (Signed)
 Emergency Medicine Observation Re-evaluation Note  Essam Lowdermilk is a 73 y.o. male, seen on rounds today.  Pt initially presented to the ED for complaints of Dementia Currently, the patient is resting comfortably. No acute events overnight   Physical Exam  BP (!) 157/75 (BP Location: Right Arm)   Pulse 67   Temp 98 F (36.7 C) (Oral)   Resp 17   SpO2 99%  Physical Exam General: NAD Lungs: Respirations even and unlabored Psych: calm  ED Course / MDM  EKG:   I have reviewed the labs performed to date as well as medications administered while in observation.  Recent changes in the last 24 hours include waiting for [facility] placement  Plan  Current plan is for waiting for [facility] team for placement.       Jerrol Agent, MD 08/23/24 [phone]

## 2024-08-23 NOTE — ED Notes (Signed)
 Patient has not been aggressive today. He went to the bathroom, took all his medications and ate 100 percent of his breakfast. He is sitting on the side of the bed eating is lunch.

## 2024-08-23 NOTE — ED Notes (Signed)
 Patient given night time medications. Patient currently laying in bed.

## 2024-08-23 NOTE — Progress Notes (Signed)
 CSW spoke with pt's daughter [name] who consented to referral to [facility]. CSW made referral to [facility] to assist with memory care placement.

## 2024-08-23 NOTE — [place] FL2 (Signed)
 [place]  MEDICAID FL2 LEVEL OF CARE FORM     IDENTIFICATION  Patient Name: [name] Birthdate: 11/11/1951 Sex: male Admission Date (Current Location): 08/11/2024  [facility] and [place] Number:  Producer, television/film/video and Address:  [facility] [place], 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) 02/19/2024   Memory change 11/20/2020   Pain in joint of right knee 11/20/2020   Pain in joint of left knee 11/20/2020   Acute cerebrovascular accident (CVA) (HCC) 01/20/2020   Benign essential hypertension 01/20/2020   Stroke (HCC) 01/20/2020   Left homonymous hemianopsia 12/23/2019   Type II diabetes mellitus (HCC) 09/26/2014    Orientation RESPIRATION BLADDER Height & Weight     Self  Normal Continent Weight:   Height:     BEHAVIORAL SYMPTOMS/MOOD NEUROLOGICAL BOWEL NUTRITION STATUS      Continent Diet (Regular)  AMBULATORY STATUS COMMUNICATION OF NEEDS Skin   Supervision Verbally Normal                       Personal Care Assistance Level of Assistance  Bathing, Feeding, Dressing Bathing Assistance: Limited assistance Feeding assistance: Independent Dressing Assistance: Independent     Functional Limitations Info  Sight, Hearing, Speech Sight Info: Adequate Hearing Info: Adequate Speech Info: Adequate    SPECIAL CARE FACTORS FREQUENCY                       Contractures Contractures Info: Not present    Additional  Factors Info  Code Status, Allergies, Psychotropic Code Status Info: Full Code Allergies Info: Shellfish Psychotropic Info: Aricept , Depakote , Seroquel          Current Medications (08/23/2024):  This is the current hospital active medication list Current Facility-Administered Medications  Medication Dose Route Frequency Provider Last Rate Last Admin   amLODipine  (NORVASC ) tablet 5 mg  5 mg Oral Daily Steinl, Kevin, MD   5 mg at 08/22/24 0951   aspirin  EC tablet 81 mg  81 mg Oral Daily Steinl, Kevin, MD   81 mg at 08/22/24 0951   atorvastatin  (LIPITOR) tablet 40 mg  40 mg Oral q1800 Steinl, Kevin, MD   40 mg at 08/22/24 1725   carvedilol  (COREG ) tablet 6.25 mg  6.25 mg Oral BID WC Steinl, Kevin, MD   6.25 mg at 08/23/24 9197   divalproex  (DEPAKOTE  SPRINKLE) capsule 125 mg  125 mg Oral BID Steinl, Kevin, MD   125 mg at 08/22/24 2218   donepezil  (ARICEPT ) tablet 10 mg  10 mg Oral QHS Steinl, Kevin, MD   10 mg at 08/22/24 2217   haloperidol  lactate (HALDOL ) injection 3 mg  3 mg Intramuscular Once Melvenia Motto, MD       latanoprost  (XALATAN ) 0.005 % ophthalmic solution 1 drop  1 drop Both Eyes QHS Steinl, Kevin, MD   1 drop at 08/22/24 2219   LORazepam  (ATIVAN ) injection 1 mg  1  mg Intramuscular Once Melvenia Motto, MD       memantine  (NAMENDA ) tablet 10 mg  10 mg Oral BID Steinl, Kevin, MD   10 mg at 08/22/24 2218   metFORMIN  (GLUCOPHAGE ) tablet 500 mg  500 mg Oral BID WC Steinl, Kevin, MD   500 mg at 08/23/24 0802   QUEtiapine  (SEROQUEL ) tablet 100 mg  100 mg Oral QHS Steinl, Kevin, MD   100 mg at 08/22/24 2230   tamsulosin  (FLOMAX ) capsule 0.4 mg  0.4 mg Oral QHS Steinl, Kevin, MD   0.4 mg at 08/22/24 2218   Current Outpatient Medications  Medication Sig Dispense Refill   amLODipine  (NORVASC ) 5 MG tablet Take 5 mg by mouth daily.     aspirin  EC 81 MG tablet Take 1 tablet (81 mg total) by mouth daily. Swallow whole.     atorvastatin  (LIPITOR) 40 MG tablet Take 1 tablet (40 mg total) by mouth  daily at 6 PM. (Patient taking differently: Take 40 mg by mouth at bedtime.) 30 tablet 2   carvedilol  (COREG ) 6.25 MG tablet Take 6.25 mg by mouth 2 (two) times daily with a meal.     divalproex  (DEPAKOTE  SPRINKLE) 125 MG capsule Take 125 mg by mouth 2 (two) times daily.     donepezil  (ARICEPT ) 10 MG tablet Take 1 tablet (10 mg total) by mouth at bedtime. 90 tablet 3   furosemide  (LASIX ) 20 MG tablet Take 40 mg by mouth in the morning.     latanoprost  (XALATAN ) 0.005 % ophthalmic solution Place 1 drop into both eyes at bedtime.     LORazepam  (ATIVAN ) 0.5 MG tablet Take 0.5 mg by mouth every 8 (eight) hours.     losartan  (COZAAR ) 50 MG tablet Take 50 mg by mouth 2 (two) times daily.     memantine  (NAMENDA ) 10 MG tablet Take 1 tablet (10 mg total) by mouth 2 (two) times daily. 180 tablet 3   metFORMIN  (GLUCOPHAGE ) 500 MG tablet Take 500 mg by mouth in the morning and at bedtime.     Multiple Vitamins-Minerals (ONE-A-DAY MENS 50+ PO) Take 1 tablet by mouth daily with breakfast.     QUEtiapine  (SEROQUEL ) 100 MG tablet Take 100 mg by mouth at bedtime.     REFRESH TEARS PF 0.5-0.9 % SOLN Place 1 drop into both eyes 2 (two) times daily.     tamsulosin  (FLOMAX ) 0.4 MG CAPS capsule Take 0.4 mg by mouth at bedtime.     traZODone  (DESYREL ) 150 MG tablet TAKE 1 TABLET AT BEDTIME 60 tablet 5     Discharge Medications: Please see discharge summary for a list of discharge medications.  Relevant Imaging Results:  Relevant Lab Results:   Additional Information SSN: [id number]  Niels LITTIE Portugal, LCSW

## 2024-08-23 NOTE — Progress Notes (Addendum)
 2:50pm: CSW received return call from [facility] who states DON wants to speak with bedside RN regarding patient and his behaviors. CSW provided Hospital doctor with contact information for [employer], Charity fundraiser.  CSW informed RN to expect call from DON at [facility] in [place].  11am: CSW received call from [facility] at [facility] in [place] who states the facility's semi-private room rate is $2,700 monthly. Amber agreeable to review referral. CSW will send clinicals via secure email to [email or web address]  10:30am: CSW received call from Reardan at [facility] who states the facility does not have a locked memory care unit.  9:50am: CSW spoke with Isaiah at [facility] to obtain quote for the locked memory care unit - Isaiah to check with business office and notify CSW of monthly cost.   CSW attempted to reach [facility] in marketing at [employer] [facility] at [place] without success - a voicemail was left requesting a return call.  CSW with Lolita, receptionist at [facility]	[place] at [employer] who states she will request marketing liaison contact CSW to discuss pricing. CSW spoke with Harlene, of marketing who states the semi-private room rate is $165 daily which is $4,950 monthly.  CSW spoke with Heron Morley who states the facility is $5,200 monthly in addition to a $750 community fee that is required for admission.  CSW spoke with Ginger at [facility] who states a semi-private room at the facility is $5,200 monthly. Ginger agreeable to check with other facilities to determine if any fit into patient's budget.  CSW attempted to reach Harwood at [facility] in [place] without success - a voicemail was left requesting a return call.  CSW attempted to reach Christopher at [facility] DSS without success - a voicemail was left requesting a return.   [name], MSW, LCSW Transitions of Care  Clinical Social Worker II [phone]

## 2024-08-24 ENCOUNTER — Ambulatory Visit: Payer: Self-pay | Admitting: Cardiology

## 2024-08-24 LAB — CUP PACEART REMOTE DEVICE CHECK
Date Time Interrogation Session: 20250830230628
Implantable Pulse Generator Implant Date: 20210201

## 2024-08-24 NOTE — Progress Notes (Signed)
 CSW spoke with Hospital doctor at [facility] who states the required assessment was completed yesterday by bedside RN and the facility DON is working to determine the appropriate level of care for is patient. Once the level of care is determined, Amber will reach out to patient's daughter to discuss pricing. CSW informed Amber that a TB skin test was done in March 2025 and CSW provided negative results for proof.  [name], MSW, LCSW Transitions of Care  Clinical Social Worker II [phone]

## 2024-08-24 NOTE — ED Provider Notes (Signed)
 Emergency Medicine Observation Re-evaluation Note  [name] is a 73 y.o. male, seen on rounds today.  Pt initially presented to the ED for complaints of Dementia Currently, the patient is sleeping, appears comfortable. No acute events overnight  Physical Exam  BP (!) 166/85 (BP Location: Right Arm)   Pulse 67   Temp 97.6 F (36.4 C) (Oral)   Resp 18   SpO2 97%  Physical Exam General: NAD Lungs: No respiratory distress Psych: calm, cooperative  ED Course / MDM  EKG:   I have reviewed the labs performed to date as well as medications administered while in observation.  Recent changes in the last 24 hours include waiting for [facility] and placement  Plan  Current plan is for placement per [facility]    Demiah Gullickson L, DO 08/24/24 9186

## 2024-08-25 NOTE — Progress Notes (Signed)
 CSW received call from [facility] at [employer] [facility] [employer] in [place] who states the DON is requesting additional clinicals to sent for review. CSW will send clinicals via secure email.  [name], MSW, LCSW Transitions of Care  Clinical Social Worker II [phone]

## 2024-08-25 NOTE — ED Provider Notes (Signed)
 Emergency Medicine Observation Re-evaluation Note  [name] is a 73 y.o. male, seen on rounds today.  Pt initially presented to the ED for complaints of Dementia Currently, the patient is not having any acute complaints.  Physical Exam  BP (!) 151/93   Pulse 66   Temp 98.6 F (37 C)   Resp 17   SpO2 100%  Physical Exam General: Resting comfortably in stretcher Lungs: Normal work of breathing Psych: Calm  ED Course / MDM  EKG:   I have reviewed the labs performed to date as well as medications administered while in observation.  Recent changes in the last 24 hours include none.  Plan  Current plan is for placement.    [name] Lamar BROCKS, MD 08/25/24 [phone]

## 2024-08-26 NOTE — ED Provider Notes (Signed)
 Emergency Medicine Observation Re-evaluation Note  [name] is a 73 y.o. male, seen on rounds today.  Pt initially presented to the ED for complaints of Dementia Currently, the patient is resting.  Physical Exam  BP (!) 145/68 (BP Location: Left Arm)   Pulse (!) 58   Temp (!) 97.5 F (36.4 C) (Oral)   Resp 16   SpO2 100%  Physical Exam General: calm Cardiac:  Lungs:  Psych:   ED Course / MDM  EKG:   I have reviewed the labs performed to date as well as medications administered while in observation.  Recent changes in the last 24 hours include .  Plan  Current plan is for placement.    Ruthe Cornet, DO 08/26/24 [phone]

## 2024-08-26 NOTE — Progress Notes (Signed)
 CSW received email from [facility] in [place] who states the facility is unable to accept patient due to his previous behaviors. Per [facility], the family has been notified.  [name], MSW, LCSW Transitions of Care  Clinical Social Worker II [phone]

## 2024-08-27 LAB — CBG MONITORING, ED
Glucose-Capillary: 171 mg/dL — ABNORMAL HIGH (ref 70–99)
Glucose-Capillary: 88 mg/dL (ref 70–99)

## 2024-08-27 NOTE — ED Provider Notes (Signed)
 Emergency Medicine Observation Re-evaluation Note  Mical Kicklighter is a 73 y.o. male, seen on rounds today.  Pt initially presented to the ED for complaints of Dementia Currently, the patient is awake and alert.  He has no complaints.  He was kicked out of his facility due to aggressive behavior on 8/21.  SW has been working on placement.  Physical Exam  BP (!) 178/68 (BP Location: Right Arm)   Pulse 67   Temp 98.6 F (37 C) (Oral)   Resp 18   SpO2 99%  Physical Exam General: awake and alert Cardiac: rr Lungs: clear Psych: calm  ED Course / MDM  EKG:   I have reviewed the labs performed to date as well as medications administered while in observation.  Recent changes in the last 24 hours include none.  Plan  Current plan is for placement via TOC.    Dean Clarity, MD 08/27/24 1139

## 2024-08-28 NOTE — ED Provider Notes (Signed)
 Emergency Medicine Observation Re-evaluation Note  [name] is a 73 y.o. male, seen on rounds today.  Pt initially presented to the ED for complaints of Dementia Currently, the patient is asleep. He was kicked out of his facility due to aggressive behavior on 8/21.  SW has been working on placement.  Nursing reports that he's been getting up occasionally to walk to the bathroom.   Physical Exam  BP (!) 163/79 (BP Location: Right Arm)   Pulse (!) 57   Temp 97.8 F (36.6 C) (Oral)   Resp 20   SpO2 100%  Physical Exam General: asleep Cardiac: rr Lungs: clear Psych: calm  ED Course / MDM  EKG:   I have reviewed the labs performed to date as well as medications administered while in observation.  Recent changes in the last 24 hours include none.  Plan  Current plan is for placement via TOC.    Dean Clarity, MD 08/28/24 1113

## 2024-08-29 ENCOUNTER — Encounter: Payer: Self-pay | Admitting: Adult Health

## 2024-08-29 ENCOUNTER — Encounter (INDEPENDENT_AMBULATORY_CARE_PROVIDER_SITE_OTHER): Admitting: Ophthalmology

## 2024-08-29 DIAGNOSIS — H04123 Dry eye syndrome of bilateral lacrimal glands: Secondary | ICD-10-CM

## 2024-08-29 DIAGNOSIS — I10 Essential (primary) hypertension: Secondary | ICD-10-CM

## 2024-08-29 DIAGNOSIS — E113313 Type 2 diabetes mellitus with moderate nonproliferative diabetic retinopathy with macular edema, bilateral: Secondary | ICD-10-CM

## 2024-08-29 DIAGNOSIS — E119 Type 2 diabetes mellitus without complications: Secondary | ICD-10-CM

## 2024-08-29 DIAGNOSIS — Z961 Presence of intraocular lens: Secondary | ICD-10-CM

## 2024-08-29 DIAGNOSIS — H33321 Round hole, right eye: Secondary | ICD-10-CM

## 2024-08-29 DIAGNOSIS — H35033 Hypertensive retinopathy, bilateral: Secondary | ICD-10-CM

## 2024-08-29 NOTE — ED Provider Notes (Signed)
 Emergency Medicine Observation Re-evaluation Note  [name] is a 73 y.o. male, seen on rounds today.  Pt initially presented to the ED for complaints of Dementia Currently, the patient is resting  Physical Exam  BP (!) 144/77 (BP Location: Left Arm)   Pulse 71   Temp 98.3 F (36.8 C) (Oral)   Resp 20   SpO2 98%  Physical Exam General: NAD Cardiac: Regular HR Lungs: No resp distress   ED Course / MDM  EKG:   I have reviewed the labs performed to date as well as medications administered while in observation.    Plan  Current plan is for [facility] assistance with placement.    Cottie Donnice PARAS, MD 08/29/24 [phone]

## 2024-08-29 NOTE — ED Notes (Signed)
 Pt independently ambulated to restroom & back to treatment room with steady gait. No complaints at this time.

## 2024-08-29 NOTE — TOC Progression Note (Signed)
 Transition of Care [facility]) - Progression Note    Patient Details  Name: [name] MRN: [id number] Date of Birth: [birth year]  [facility]) CM/SW Contact  Luann SHAUNNA Cumming, [place] Phone Number: 08/29/2024, 9:07 AM  Clinical Narrative:     Spoke with Danielle with [employer]. She is working with [facility] for potential placement. She requested clinicals which CSW securely emailed her.         Social Drivers of Health (SDOH) Interventions SDOH Screenings   Food Insecurity: Patient Unable To Answer (02/27/2024)  Housing: Patient Unable To Answer (02/27/2024)  Transportation Needs: Patient Unable To Answer (02/27/2024)  Utilities: Patient Unable To Answer (02/27/2024)  Social Connections: Unknown (02/27/2024)  Tobacco Use: Low Risk  (08/11/2024)    Readmission Risk Interventions     No data to display

## 2024-08-29 NOTE — ED Notes (Addendum)
 Assuming care of pt at this time. Pt is AAOx1.

## 2024-08-30 NOTE — Progress Notes (Signed)
 Remote Loop Recorder Transmission

## 2024-08-30 NOTE — ED Provider Notes (Signed)
 Emergency Medicine Observation Re-evaluation Note  [name] is a 73 y.o. male, seen on rounds today.  Pt initially presented to the ED for complaints of Dementia Currently, the patient is in room without complaint.  Physical Exam  BP (!) 148/78   Pulse 73   Temp (!) 97.3 F (36.3 C) (Oral)   Resp 18   SpO2 100%  Physical Exam General: Awake and alert Cardiac: Normal rate Lungs: Normal effort Psych: Mood is appropriate  ED Course / MDM  EKG:   I have reviewed the labs performed to date as well as medications administered while in observation.  Recent changes in the last 24 hours include there have been no changes.  Patient is still pending placement which has proven difficult..  Plan  Current plan is for placement.    [name] Pac T, DO 08/30/24 [phone]

## 2024-08-30 NOTE — TOC Progression Note (Signed)
 Transition of Care [facility]) - Progression Note    Patient Details  Name: [name] MRN: [id number] Date of Birth: 23-Jun-1951  Transition of Care [facility]) CM/SW Contact  Luann SHAUNNA [place], [place] Phone Number: 08/30/2024, 10:45 AM  Clinical Narrative:     [employer] continuing to work with [facility] memory care for potential placement. [facility] is reviewing clinicals.      Social Drivers of Health (SDOH) Interventions SDOH Screenings   Food Insecurity: Patient Unable To Answer (02/27/2024)  Housing: Patient Unable To Answer (02/27/2024)  Transportation Needs: Patient Unable To Answer (02/27/2024)  Utilities: Patient Unable To Answer (02/27/2024)  Social Connections: Unknown (02/27/2024)  Tobacco Use: Low Risk  (08/11/2024)    Readmission Risk Interventions     No data to display

## 2024-08-31 NOTE — ED Provider Notes (Signed)
 Emergency Medicine Observation Re-evaluation Note  [name] is a 73 y.o. male, seen on rounds today.  Pt initially presented to the ED for complaints of Dementia Currently, the patient is resting.  Physical Exam  BP (!) 153/74 (BP Location: Right Arm)   Pulse 69   Temp 98.3 F (36.8 C) (Oral)   Resp 20   SpO2 100%  Physical Exam   ED Course / MDM  EKG:   I have reviewed the labs performed to date as well as medications administered while in observation.  Recent changes in the last 24 hours include nothing.  Plan  Current plan is for placement.    Dasie Faden, MD 08/31/24 [phone]

## 2024-08-31 NOTE — Progress Notes (Addendum)
 [name] [name] with [employer] who explained that [name] likely won't be affordable with pt's income. Additionally his monthly pension will not be deposited until 9/17. [name] has found a memory care facility in [employer]) that does fit into pt's budget though she has been calling pt's daughter [name] [name] and [name] [name]'s phone is disconnected.   CSW called [name] [name] and phone was disconnected.  CSW called pt's son and his phone is also not connected.  CSW called and left message with pt's other daughter, [name] [name], and left message requesting return call.   1030: Notified that [name] [name] has called [name] [name] back and has been updated. [name] at the [employer] will come assess pt tomorrow  between 4 and 530pm.   1040: Received call from [name] [name] who is requesting to speak with the doctor to discuss pt's meds for agitation.  She explains she really wants pt to be able to get into [facility] as it is in their budget and is aware pt's agitation is a barrier. CSW notified provider via secure chat.

## 2024-09-01 NOTE — ED Notes (Signed)
 [name] at bedside with pt.

## 2024-09-01 NOTE — ED Notes (Signed)
 Pt ambulated to the bathroom.

## 2024-09-01 NOTE — ED Notes (Signed)
 [name], Director of [employer] called, [name] with [employer] will be coming to assess the pt for placement.

## 2024-09-01 NOTE — ED Notes (Signed)
 Awaiting food tray for pt medication.

## 2024-09-01 NOTE — ED Notes (Signed)
 Pt is in good spirits, thanks me for everything I do. States that he feels good, like [name], when asked how he is feeling after using the bathroom.

## 2024-09-01 NOTE — ED Notes (Signed)
 Pt ambulated to bathroom, linens changed by Designer, television/film set.

## 2024-09-01 NOTE — ED Provider Notes (Signed)
 Emergency Medicine Observation Re-evaluation Note  [name] is a 73 y.o. male, seen on rounds today.  Pt initially presented to the ED for complaints of Dementia Currently, the patient is resting.  Physical Exam  BP (!) 148/76 (BP Location: Left Arm)   Pulse (!) 58   Temp 97.8 F (36.6 C) (Oral)   Resp 16   SpO2 100%  Physical Exam General: resting Cardiac:  Lungs:  Psych:   ED Course / MDM  EKG:   I have reviewed the labs performed to date as well as medications administered while in observation.  Recent changes in the last 24 hours include nothing.  Plan  Current plan is for placement.    Ruthe Cornet, DO 09/01/24 [phone]

## 2024-09-01 NOTE — ED Notes (Signed)
 Pt ambulated to bathroom

## 2024-09-02 ENCOUNTER — Ambulatory Visit: Payer: Medicare HMO

## 2024-09-02 MED ORDER — METFORMIN HCL 500 MG PO TABS
500.0000 mg | ORAL_TABLET | Freq: Two times a day (BID) | ORAL | Status: DC
  Administered 2024-09-03 – 2024-09-07 (×9): 500 mg via ORAL
  Filled 2024-09-02 (×9): qty 1

## 2024-09-02 NOTE — Progress Notes (Signed)
 Spoke with Danielle with [facility]. The [facility] has made bed offer under the condition that pt's PRN Ativan  can be switched to Ativan  topical gel. CSW notified provider and RN via secure chat.  They also would not be able to admit pt until Wednesday 9/17 at the earliest due to pt's private pay funds not be available until that date.

## 2024-09-02 NOTE — ED Notes (Signed)
 Assumed care of pt. He is Alert and Awake. Ambulates to the RR unassisted. Sitter present

## 2024-09-02 NOTE — ED Provider Notes (Signed)
 Emergency Medicine Observation Re-evaluation Note  [name] is a 73 y.o. male, seen on rounds today.  Pt initially presented to the ED for complaints of Dementia Currently, the patient is eating.  Physical Exam  BP (!) 175/75 (BP Location: Left Arm)   Pulse 73   Temp 97.8 F (36.6 C) (Oral)   Resp 18   SpO2 100%  Physical Exam  ED Course / MDM  EKG:   I have reviewed the labs performed to date as well as medications administered while in observation.  Recent changes in the last 24 hours include nothing.  Plan  Current plan is for placement.    Dasie Faden, MD 09/02/24 [phone]

## 2024-09-03 NOTE — ED Provider Notes (Signed)
 Emergency Medicine Observation Re-evaluation Note  Reynolds Kittel is a 73 y.o. male, seen on rounds today.  Pt initially presented to the ED for complaints of Dementia Currently, the patient is resting.  Physical Exam  BP (!) 154/57 (BP Location: Left Arm)   Pulse (!) 58   Temp 97.7 F (36.5 C) (Oral)   Resp 18   SpO2 100%  Physical Exam General: NAD Cardiac: Regular HR Lungs: No resp distress Psych: Stable  ED Course / MDM  EKG:   I have reviewed the labs performed to date as well as medications administered while in observation.   Plan  Current plan is for potential admission to [facility] on 9/17    Cottie Donnice PARAS, MD 09/03/24 [phone]

## 2024-09-03 NOTE — ED Notes (Signed)
 Pt given fresh scrubs and socks.

## 2024-09-04 NOTE — ED Notes (Signed)
 Pt took a shower unassisted. Linens were changed and pt received a new set of scrubs.

## 2024-09-04 NOTE — ED Provider Notes (Signed)
 Emergency Medicine Observation Re-evaluation Note  [name] is a 73 y.o. male, seen on rounds today.  Pt initially presented to the ED for complaints of Dementia Currently, the patient is resting.  Physical Exam  BP (!) 143/67 (BP Location: Left Arm)   Pulse 60   Temp 98.2 F (36.8 C) (Oral)   Resp 20   SpO2 98%  Physical Exam General: nad Lungs: no resp distress Psych: calm  ED Course / MDM  EKG:   I have reviewed the labs performed to date as well as medications administered while in observation.  Recent changes in the last 24 hours include no acute change.  Plan  Current plan is for [facility] @ [facility] tentatively     [name] LABOR, DO 09/04/24 1114

## 2024-09-05 LAB — CBG MONITORING, ED
Glucose-Capillary: 102 mg/dL — ABNORMAL HIGH (ref 70–99)
Glucose-Capillary: 114 mg/dL — ABNORMAL HIGH (ref 70–99)

## 2024-09-05 NOTE — ED Provider Notes (Signed)
 Emergency Medicine Observation Re-evaluation Note  [name] is a 73 y.o. male, seen on rounds today.  Pt initially presented to the ED for complaints of Dementia Currently, the patient is sleeping, no acute events overnight.  Physical Exam  BP (!) 153/73 (BP Location: Left Arm)   Pulse 67   Temp 97.8 F (36.6 C) (Oral)   Resp 15   SpO2 100%  Physical Exam General: NAD  Lungs: No respiratory distress Psych: Calm, cooperative  ED Course / MDM  EKG:   I have reviewed the labs performed to date as well as medications administered while in observation.  Recent changes in the last 24 hours include none  Plan  Current plan is for tentative placement at [facility]     Analysa Nutting L, DO 09/05/24 [phone]

## 2024-09-05 NOTE — ED Notes (Signed)
Patient ambulate to the bathroom.

## 2024-09-05 NOTE — ED Notes (Signed)
 Patient attempted to walk out back door after coming from the bathroom. Patient redirected back to his room. [place] sandwich given.

## 2024-09-05 NOTE — ED Notes (Signed)
 Patient up to use the bathroom, asked about meal time when he was told it was in the middle of the night patient became very agitated hitting the trash can and walked up to staff. Security notified. Patient redirected back to his room.

## 2024-09-05 NOTE — ED Notes (Signed)
Patient walked

## 2024-09-05 NOTE — ED Notes (Addendum)
 Patient walked out the back door of purple unit. Redirected back to his room, patient agitated stated he was getting out of here. Bed alarm placed on bed.

## 2024-09-06 LAB — CBG MONITORING, ED: Glucose-Capillary: 105 mg/dL — ABNORMAL HIGH (ref 70–99)

## 2024-09-06 MED ORDER — MEMANTINE HCL 10 MG PO TABS: 10.0000 mg | ORAL_TABLET | Freq: Two times a day (BID) | ORAL | 0 refills | Status: AC

## 2024-09-06 MED ORDER — AMLODIPINE BESYLATE 5 MG PO TABS: 5.0000 mg | ORAL_TABLET | Freq: Every day | ORAL | 0 refills | Status: AC

## 2024-09-06 MED ORDER — LATANOPROST 0.005 % OP SOLN: 1.0000 [drp] | Freq: Every day | OPHTHALMIC | 0 refills | Status: AC

## 2024-09-06 MED ORDER — TAMSULOSIN HCL 0.4 MG PO CAPS: 0.4000 mg | ORAL_CAPSULE | Freq: Every day | ORAL | 0 refills | Status: AC

## 2024-09-06 MED ORDER — LORAZEPAM 0.5 MG PO TABS: 0.5000 mg | ORAL_TABLET | Freq: Three times a day (TID) | ORAL | 0 refills | Status: AC | PRN

## 2024-09-06 MED ORDER — QUETIAPINE FUMARATE 100 MG PO TABS: 100.0000 mg | ORAL_TABLET | Freq: Every day | ORAL | 0 refills | Status: DC

## 2024-09-06 MED ORDER — ASPIRIN 81 MG PO CHEW: 81.0000 mg | CHEWABLE_TABLET | Freq: Every day | ORAL | 0 refills | Status: AC

## 2024-09-06 MED ORDER — DONEPEZIL HCL 10 MG PO TABS: 10.0000 mg | ORAL_TABLET | Freq: Every day | ORAL | 0 refills | Status: AC

## 2024-09-06 MED ORDER — CARVEDILOL 6.25 MG PO TABS: 6.2500 mg | ORAL_TABLET | Freq: Two times a day (BID) | ORAL | 0 refills | Status: AC

## 2024-09-06 MED ORDER — METFORMIN HCL 500 MG PO TABS: 500.0000 mg | ORAL_TABLET | Freq: Two times a day (BID) | ORAL | 0 refills | Status: AC

## 2024-09-06 MED ORDER — DIVALPROEX SODIUM 125 MG PO DR TAB: 125.0000 mg | DELAYED_RELEASE_TABLET | Freq: Two times a day (BID) | ORAL | 0 refills | Status: DC

## 2024-09-06 MED ORDER — ATORVASTATIN CALCIUM 40 MG PO TABS: 40.0000 mg | ORAL_TABLET | Freq: Every day | ORAL | 0 refills | Status: AC

## 2024-09-06 NOTE — TOC Progression Note (Addendum)
 Transition of Care [facility]) - Progression Note    Patient Details  Name: [name] MRN: [id number] Date of Birth: Jan 11, 1951  Transition of Care [facility]) CM/SW Contact  Luann SHAUNNA Cum[place], [place] Phone Number: 09/06/2024, 9:58 AM  Clinical Narrative:     Plan for pt to DC to [facility] memory care facility tomorrow. Facility will require a prescription for topical ativan  gel. CSW awaiting response from [facility] with [facility] whether facility will require any other prescriptions.   1145: Informed that facility will require prescriptions for all of pt's meds   1245: Informed by Edsel that facility would require meds to be filled at their pharmacy today prior to pt coming to facility tomorrow. Edsel will get info of the pharmacy [place] uses.   CSW emailed TB skin test results from March 2025 to [place].   1305: CSW is informed of pharmacy info:  [facility] [place]  Phone: [phone]  Fax: [phone]   CSW notified provider. Provider explains they are unable to locate pharmacy in database and cannot call narcotics in.   1455: Correct Pharmacy name is [facility] located at [address] [place], [place] [address]. The phone numbers are the same. Notified provider. Meds to be been sent to the correct pharmacy.   1635: Meds were sent to correct pharmacy but  ativan  prescription was for a pill and facility will require ativan  prescription to be a topical Gel. Notified provider and informed that staff call the facility and was informed that they needed   0.5mg  PO ativan  prescription. CSW awaiting clarification on this prescription as previously they were requiring topical prescription.            Social Drivers of Health (SDOH) Interventions SDOH Screenings   Food Insecurity: Patient Unable To Answer (02/27/2024)  Housing: Patient Unable To Answer (02/27/2024)  Transportation Needs: Patient Unable To Answer (02/27/2024)  Utilities: Patient Unable To  Answer (02/27/2024)  Social Connections: Unknown (02/27/2024)  Tobacco Use: Low Risk  (08/11/2024)    Readmission Risk Interventions     No data to display

## 2024-09-06 NOTE — ED Notes (Signed)
 Pt resting.

## 2024-09-06 NOTE — ED Provider Notes (Addendum)
 Emergency Medicine Observation Re-evaluation Note  [name] is a 73 y.o. male, seen on rounds today.  Pt initially presented to the ED for complaints of Dementia Currently, the patient is sleeping.  Physical Exam  BP (!) 151/81   Pulse 60   Temp 98 F (36.7 C) (Oral)   Resp 15   SpO2 98%  Physical Exam General: No acute distress Cardiac: Normal rate Lungs: No increased work of breathing Psych: Calm  ED Course / MDM  EKG:   I have reviewed the labs performed to date as well as medications administered while in observation.  Recent changes in the last 24 hours include none.  Plan  Current plan is for nursing home placement.  Medications were sent to outpatient pharmacy for the nursing home placement.  The only thing that was not sent was the Ativan  gel that was requested by the nursing facility as the nurses still trying to ascertain exactly what the NH is requesting.    Lenor Hollering, MD 09/06/24 0830    Lenor Hollering, MD 09/06/24 [phone]

## 2024-09-06 NOTE — ED Notes (Signed)
 Pt sleeping.

## 2024-09-07 LAB — CBG MONITORING, ED: Glucose-Capillary: 111 mg/dL — ABNORMAL HIGH (ref 70–99)

## 2024-09-07 MED ORDER — HALOPERIDOL LACTATE 5 MG/ML IJ SOLN
2.0000 mg | Freq: Once | INTRAMUSCULAR | Status: AC
  Administered 2024-09-07: 2 mg via INTRAMUSCULAR
  Filled 2024-09-07: qty 1

## 2024-09-07 NOTE — ED Notes (Signed)
 Night shift staff left at 0730, staying an extra 30 minutes to help out, leaving patient with no sitter for this time. Staffing called and staffing states that they do not have anyone available at this time, however maybe at 1100.

## 2024-09-07 NOTE — ED Provider Notes (Signed)
 Notified by nursing staff of agitation. No urinary retention on bladder scan. He was treated with haloperidol  for his agitation.   Griselda Norris, MD 09/07/24 [phone]

## 2024-09-07 NOTE — Progress Notes (Addendum)
 Spoke with Edsel from [facility] who confirmed that facilities administrator is requiring ativant topical gel prescription. CSW requested provider to send needed prescription to pharmacy.   1000: Provider unable to send ativan  topical script. [facility] has agreed to still accept pt today and will manage with current meds until their provider can see pt. Daughter to transport pt around 11/1130a. Facility is request that pt receive his meds prior to transport. RN and provider updated.

## 2024-09-07 NOTE — ED Provider Notes (Addendum)
 Emergency Medicine Observation Re-evaluation Note  [name] is a 73 y.o. male, seen on rounds today.  Pt initially presented to the ED for complaints of Dementia Currently, the patient is waiting for placement.  Received haldol  last night but this AM is awake, alert, and cooperative.  Waiting for breakfast.  Physical Exam  BP (!) 169/78   Pulse 70   Temp 98.2 F (36.8 C) (Oral)   Resp 18   SpO2 100%  Physical Exam General: NAD Cardiac: RR Lungs: even unlabored Psych: calm  ED Course / MDM  EKG:   I have reviewed the labs performed to date as well as medications administered while in observation.  Recent changes in the last 24 hours include received haldol  for agitation.  Plan  Current plan is for nursing home placement.   Per previous notes: Medications were sent to outpatient pharmacy for the nursing home placement.  The only thing that was not sent was the Ativan  gel that was requested by the nursing facility as the nurses still trying to ascertain exactly what the NH is requesting.  This order for ativan  gel is not available through out ordering//online. Discussed with pharmacy who reports we can write a prescription to be compounded of a diphenhydramine /haloperidol /ativan  gel but needs to be sent on paper rx and faxed over.  SW is now trying to discuss with facility.    SW discussed with facility--they will accept him now without the gel.  He does have oral ativan .  Daugther to pick him up at 1130AM. Dreama Longs, MD 09/07/24 1010    Dreama Longs, MD 09/07/24 1046

## 2024-09-12 ENCOUNTER — Ambulatory Visit: Payer: Medicare HMO

## 2024-09-20 NOTE — Progress Notes (Signed)
 Remote Loop Recorder Transmission

## 2024-09-21 NOTE — Progress Notes (Signed)
 Remote Loop Recorder Transmission

## 2024-09-22 ENCOUNTER — Ambulatory Visit

## 2024-09-22 DIAGNOSIS — I639 Cerebral infarction, unspecified: Secondary | ICD-10-CM | POA: Diagnosis not present

## 2024-09-22 LAB — CUP PACEART REMOTE DEVICE CHECK
Date Time Interrogation Session: 20250930231954
Implantable Pulse Generator Implant Date: 20210201

## 2024-09-25 ENCOUNTER — Ambulatory Visit: Payer: Self-pay | Admitting: Cardiology

## 2024-09-26 NOTE — Progress Notes (Signed)
 Remote Loop Recorder Transmission

## 2024-10-07 ENCOUNTER — Ambulatory Visit: Payer: Medicare HMO

## 2024-10-24 ENCOUNTER — Ambulatory Visit

## 2024-10-24 DIAGNOSIS — R55 Syncope and collapse: Secondary | ICD-10-CM | POA: Diagnosis not present

## 2024-10-24 LAB — CUP PACEART REMOTE DEVICE CHECK
Date Time Interrogation Session: 20251102230823
Implantable Pulse Generator Implant Date: 20210201

## 2024-10-26 ENCOUNTER — Ambulatory Visit: Payer: Self-pay | Admitting: Cardiology

## 2024-10-27 NOTE — Progress Notes (Signed)
 Remote Loop Recorder Transmission

## 2024-11-11 ENCOUNTER — Ambulatory Visit: Payer: Medicare HMO

## 2024-11-13 ENCOUNTER — Other Ambulatory Visit: Payer: Self-pay | Admitting: Internal Medicine

## 2024-11-15 ENCOUNTER — Encounter: Payer: Self-pay | Admitting: Adult Health

## 2024-11-15 ENCOUNTER — Ambulatory Visit: Admitting: Adult Health

## 2024-11-15 VITALS — BP 140/88 | Ht 67.0 in | Wt 214.6 lb

## 2024-11-15 DIAGNOSIS — F01C11 Vascular dementia, severe, with agitation: Secondary | ICD-10-CM

## 2024-11-15 DIAGNOSIS — I639 Cerebral infarction, unspecified: Secondary | ICD-10-CM

## 2024-11-15 DIAGNOSIS — Z79899 Other long term (current) drug therapy: Secondary | ICD-10-CM | POA: Diagnosis not present

## 2024-11-15 DIAGNOSIS — Z8673 Personal history of transient ischemic attack (TIA), and cerebral infarction without residual deficits: Secondary | ICD-10-CM

## 2024-11-15 DIAGNOSIS — E785 Hyperlipidemia, unspecified: Secondary | ICD-10-CM

## 2024-11-15 NOTE — Patient Instructions (Addendum)
 Your Plan:  Will request information from facility for review   Continue current treatment plan at this time  We will check lab work today - will be notified via MyChart regarding results     Follow up in 6 months or call earlier if needed     Thank you for coming to see us  at [facility]. I hope we have been able to provide you high quality care today.  You may receive a patient satisfaction survey over the next few weeks. We would appreciate your feedback and comments so that we may continue to improve ourselves and the health of our patients.
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
## 2024-11-23 ENCOUNTER — Ambulatory Visit: Payer: Self-pay | Admitting: Adult Health

## 2024-11-23 LAB — APOE ALZHEIMER'S DISEASE RISK

## 2024-11-23 LAB — LIPID PANEL
Chol/HDL Ratio: 2.5 ratio (ref 0.0–5.0)
Cholesterol, Total: 115 mg/dL (ref 100–199)
HDL: 46 mg/dL (ref >39)
LDL Chol Calc (NIH): 55 mg/dL (ref 0–99)
Triglycerides: 65 mg/dL (ref 0–149)
VLDL Cholesterol Cal: 14 mg/dL (ref 5–40)

## 2024-11-23 LAB — COMPREHENSIVE METABOLIC PANEL WITH GFR
ALT: 11 IU/L (ref 0–44)
AST: 14 IU/L (ref 0–40)
Albumin: 4.2 g/dL (ref 3.8–4.8)
Alkaline Phosphatase: 58 IU/L (ref 47–123)
BUN/Creatinine Ratio: 15 (ref 10–24)
BUN: 20 mg/dL (ref 8–27)
Bilirubin Total: 0.4 mg/dL (ref 0.0–1.2)
CO2: 26 mmol/L (ref 20–29)
Calcium: 9.2 mg/dL (ref 8.6–10.2)
Chloride: 102 mmol/L (ref 96–106)
Creatinine, Ser: 1.31 mg/dL — ABNORMAL HIGH (ref 0.76–1.27)
Globulin, Total: 2.6 g/dL (ref 1.5–4.5)
Glucose: 108 mg/dL — ABNORMAL HIGH (ref 70–99)
Potassium: 4.3 mmol/L (ref 3.5–5.2)
Sodium: 144 mmol/L (ref 134–144)
Total Protein: 6.8 g/dL (ref 6.0–8.5)
eGFR: 58 mL/min/1.73 — ABNORMAL LOW (ref >59)

## 2024-11-23 LAB — VALPROIC ACID LEVEL: Valproic Acid Lvl: 15 ug/mL — ABNORMAL LOW (ref 50–100)

## 2024-11-23 LAB — HEMOGLOBIN A1C
Est. average glucose Bld gHb Est-mCnc: 120 mg/dL
Hgb A1c MFr Bld: 5.8 % — ABNORMAL HIGH (ref 4.8–5.6)

## 2024-11-23 LAB — ATN PROFILE
A -- Beta-amyloid 42/40 Ratio: 0.103 (ref >0.102)
Beta-amyloid 40: 295.43 pg/mL
Beta-amyloid 42: 30.55 pg/mL
N -- NfL, Plasma: 8.4 pg/mL — ABNORMAL HIGH (ref 0.00–6.04)
T -- p-tau181: 1.99 pg/mL — ABNORMAL HIGH (ref 0.00–0.97)

## 2024-11-24 ENCOUNTER — Ambulatory Visit

## 2024-11-24 ENCOUNTER — Encounter: Payer: Self-pay | Admitting: Adult Health

## 2024-11-26 ENCOUNTER — Emergency Department

## 2024-11-26 ENCOUNTER — Emergency Department
Admission: EM | Admit: 2024-11-26 | Discharge: 2024-11-26 | Disposition: A | Discharge status: Nursing Home (Includes ICF) | Attending: Emergency Medicine | Admitting: Emergency Medicine

## 2024-11-26 ENCOUNTER — Other Ambulatory Visit: Payer: Self-pay

## 2024-11-26 ENCOUNTER — Ambulatory Visit

## 2024-11-26 ENCOUNTER — Encounter: Payer: Self-pay | Admitting: Emergency Medicine

## 2024-11-26 ENCOUNTER — Emergency Department
Admission: EM | Admit: 2024-11-26 | Discharge: 2024-11-26 | Disposition: A | Discharge status: Home / Self Care | Attending: Emergency Medicine | Admitting: Emergency Medicine

## 2024-11-26 DIAGNOSIS — F0390 Unspecified dementia without behavioral disturbance: Secondary | ICD-10-CM | POA: Insufficient documentation

## 2024-11-26 DIAGNOSIS — E119 Type 2 diabetes mellitus without complications: Secondary | ICD-10-CM | POA: Insufficient documentation

## 2024-11-26 DIAGNOSIS — S0990XA Unspecified injury of head, initial encounter: Secondary | ICD-10-CM | POA: Insufficient documentation

## 2024-11-26 DIAGNOSIS — I10 Essential (primary) hypertension: Secondary | ICD-10-CM | POA: Insufficient documentation

## 2024-11-26 DIAGNOSIS — W1830XA Fall on same level, unspecified, initial encounter: Secondary | ICD-10-CM | POA: Insufficient documentation

## 2024-11-26 DIAGNOSIS — Z8673 Personal history of transient ischemic attack (TIA), and cerebral infarction without residual deficits: Secondary | ICD-10-CM | POA: Insufficient documentation

## 2024-11-26 DIAGNOSIS — W19XXXA Unspecified fall, initial encounter: Secondary | ICD-10-CM

## 2024-11-26 LAB — BASIC METABOLIC PANEL WITH GFR
Anion gap: 9 (ref 5–15)
BUN: 28 mg/dL — ABNORMAL HIGH (ref 8–23)
CO2: 27 mmol/L (ref 22–32)
Calcium: 9 mg/dL (ref 8.9–10.3)
Chloride: 111 mmol/L (ref 98–111)
Creatinine, Ser: 1.22 mg/dL (ref 0.61–1.24)
GFR, Estimated: >60 mL/min (ref >60)
Glucose, Bld: 120 mg/dL — ABNORMAL HIGH (ref 70–99)
Potassium: 3.8 mmol/L (ref 3.5–5.1)
Sodium: 147 mmol/L — ABNORMAL HIGH (ref 135–145)

## 2024-11-26 LAB — CBC WITH DIFFERENTIAL/PLATELET
Abs Immature Granulocytes: 0.02 K/uL (ref 0.00–0.07)
Basophils Absolute: 0 K/uL (ref 0.0–0.1)
Basophils Relative: 0 %
Eosinophils Absolute: 0.2 K/uL (ref 0.0–0.5)
Eosinophils Relative: 4 %
HCT: 36.5 % — ABNORMAL LOW (ref 39.0–52.0)
Hemoglobin: 12.1 g/dL — ABNORMAL LOW (ref 13.0–17.0)
Immature Granulocytes: 0 %
Lymphocytes Relative: 27 %
Lymphs Abs: 1.4 K/uL (ref 0.7–4.0)
MCH: 28.9 pg (ref 26.0–34.0)
MCHC: 33.2 g/dL (ref 30.0–36.0)
MCV: 87.3 fL (ref 80.0–100.0)
Monocytes Absolute: 0.5 K/uL (ref 0.1–1.0)
Monocytes Relative: 9 %
Neutro Abs: 3.1 K/uL (ref 1.7–7.7)
Neutrophils Relative %: 60 %
Platelets: 183 K/uL (ref 150–400)
RBC: 4.18 MIL/uL — ABNORMAL LOW (ref 4.22–5.81)
RDW: 13.9 % (ref 11.5–15.5)
WBC: 5.2 K/uL (ref 4.0–10.5)
nRBC: 0 % (ref 0.0–0.2)

## 2024-11-26 LAB — URINALYSIS, ROUTINE W REFLEX MICROSCOPIC
Bilirubin Urine: NEGATIVE
Glucose, UA: NEGATIVE mg/dL
Hgb urine dipstick: NEGATIVE
Ketones, ur: NEGATIVE mg/dL
Leukocytes,Ua: NEGATIVE
Nitrite: NEGATIVE
Protein, ur: NEGATIVE mg/dL
Specific Gravity, Urine: 1.011 (ref 1.005–1.030)
pH: 8 (ref 5.0–8.0)

## 2024-11-26 MED ORDER — SODIUM CHLORIDE 0.9 % IV BOLUS
1000.0000 mL | Freq: Once | INTRAVENOUS | Status: AC
  Administered 2024-11-26: 1000 mL via INTRAVENOUS

## 2024-11-26 NOTE — Discharge Instructions (Addendum)
 Patient will need constant redirection for placing his walker on the ground to walk.

## 2024-11-26 NOTE — ED Provider Notes (Signed)
 [facility] Provider Note   Event Date/Time   First MD Initiated Contact with Patient 11/26/24 2042     (approximate) History  Fall  HPI [name] is a 73 y.o. male with a past medical history of dementia, type 2 diabetes, CVA, hypertension, and hyperlipidemia who presents from [facility] for the fourth fall in 2 days.  Per report, patient had witnessed head strike during this fall however patient did not lose consciousness.  Patient denies any complaints at this time and asks why he is here. ROS: Patient currently denies any vision changes, tinnitus, difficulty speaking, facial droop, sore throat, chest pain, shortness of breath, abdominal pain, nausea/vomiting/diarrhea, dysuria, or weakness/numbness/paresthesias in any extremity   Physical Exam  Triage Vital Signs: ED Triage Vitals  Encounter Vitals Group     BP 11/26/24 2037 (!) 143/70     Girls Systolic BP Percentile --      Girls Diastolic BP Percentile --      Boys Systolic BP Percentile --      Boys Diastolic BP Percentile --      Pulse Rate 11/26/24 2037 85     Resp 11/26/24 2037 14     Temp 11/26/24 2037 98.9 F (37.2 C)     Temp Source 11/26/24 2037 Oral     SpO2 11/26/24 2034 98 %     Weight 11/26/24 2038 212 lb 9.6 oz (96.4 kg)     Height 11/26/24 2038 5' 7 (1.702 m)     Head Circumference --      Peak Flow --      Pain Score --      Pain Loc --      Pain Education --      Exclude from Growth Chart --    Most recent vital signs: Vitals:   11/26/24 2034 11/26/24 2037  BP:  (!) 143/70  Pulse:  85  Resp:  14  Temp:  98.9 F (37.2 C)  SpO2: 98% 98%   General: Awake, oriented x4. CV:  Good peripheral perfusion. Resp:  Normal effort. Abd:  No distention. Other:  Elderly overweight African-American male resting comfortably in no acute distress ED Results / Procedures / Treatments  Labs (all labs ordered are listed, but only abnormal results are displayed) Labs Reviewed  - No data to display RADIOLOGY ED MD interpretation: CT of the head without contrast interpreted by me shows no evidence of acute abnormalities including no intracerebral hemorrhage, obvious masses, or significant edema CT of the cervical spine interpreted by me does not show any evidence of acute abnormalities including no acute fracture, malalignment, height loss, or dislocation - All radiology independently interpreted and agree with radiology assessment Official radiology report(s): CT Cervical Spine Wo Contrast Result Date: 11/26/2024 EXAM: CT CERVICAL SPINE WITHOUT CONTRAST 11/26/2024 09:07:25 PM TECHNIQUE: CT of the cervical spine was performed without the administration of intravenous contrast. Multiplanar reformatted images are provided for review. Automated exposure control, iterative reconstruction, and/or weight based adjustment of the mA/kV was utilized to reduce the radiation dose to as low as reasonably achievable. COMPARISON: Comparison earlier today. CLINICAL HISTORY: Neck trauma (Age >= 65y) FINDINGS: CERVICAL SPINE: BONES AND ALIGNMENT: Loss of cervical lordosis, stable since prior study. No acute fracture or traumatic malalignment. DEGENERATIVE CHANGES: Bridging anterior osteophytes from C3 to C6. 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 SOFT TISSUES: No prevertebral soft tissue swelling. IMPRESSION: 1. No acute abnormality of the cervical spine. Electronically signed  by: Franky Crease MD 11/26/2024 09:19 PM EST RP Workstation: [id number]   CT Head Wo Contrast Result Date: 11/26/2024 EXAM: CT HEAD WITHOUT CONTRAST 11/26/2024 09:07:25 PM TECHNIQUE: CT of the head was performed without the administration of intravenous contrast. Automated exposure control, iterative reconstruction, and/or weight based adjustment of the mA/kV was utilized to reduce the radiation dose to as low as reasonably achievable. COMPARISON: Earlier today. CLINICAL HISTORY: Head trauma, minor  (Age >= 65y). FINDINGS: BRAIN AND VENTRICLES: No acute hemorrhage. No evidence of acute infarct. No hydrocephalus. No extra-axial collection. No mass effect or midline shift. Chronic encephalomalacia within the right temporal, parietal, and occipital lobes, stable. Atrophy and chronic small vessel disease throughout the deep white matter. ORBITS: No acute abnormality. SINUSES: No acute abnormality. SOFT TISSUES AND SKULL: No acute soft tissue abnormality. No skull fracture. IMPRESSION: 1. No acute intracranial abnormality. 2. Stable chronic encephalomalacia within the right temporal, parietal, and occipital lobes. 3. Atrophy and chronic small vessel disease throughout the deep white matter. Electronically signed by: Franky Crease MD 11/26/2024 09:12 PM EST RP Workstation: [id number]   CT Cervical Spine Wo Contrast Result Date: 11/26/2024 EXAM: CT CERVICAL SPINE WITHOUT CONTRAST 11/26/2024 09:37:54 AM TECHNIQUE: CT of the cervical spine was performed without the administration of intravenous contrast. Multiplanar reformatted images are provided for review. Automated exposure control, iterative reconstruction, and/or weight based adjustment of the mA/kV was utilized to reduce the radiation dose to as low as reasonably achievable. COMPARISON: CT of the cervical spine dated 07/15/2024. CLINICAL HISTORY: Neck trauma (Age >= 65y). FINDINGS: CERVICAL SPINE: BONES AND ALIGNMENT: Reversal of the normal cervical lordosis, as before. No acute fracture or traumatic malalignment. DEGENERATIVE CHANGES: Anterior bridging osteophytes present at C3, C4, C5, and C6, as demonstrated on the previous study. Mild posterior osteophyte formation at C5, C6, and C7, with mild-to-moderate central spinal canal stenosis most pronounced at C6. SOFT TISSUES: No prevertebral soft tissue swelling. IMPRESSION: 1. No acute abnormality of the cervical spine related to the provided clinical history of neck trauma. 2. Reversal of the normal cervical  lordosis, as before. 3. Anterior bridging osteophytes at C3, C4, C5, and C6, as demonstrated on the previous study. 4. Mild posterior osteophyte formation at C5, C6, and C7, with mild-to-moderate central spinal canal stenosis most pronounced at C6. Electronically signed by: Evalene Coho MD 11/26/2024 10:25 AM EST RP Workstation: [id number]   CT Head Wo Contrast Result Date: 11/26/2024 EXAM: CT HEAD WITHOUT CONTRAST 11/26/2024 09:37:54 AM TECHNIQUE: CT of the head was performed without the administration of intravenous contrast. Automated exposure control, iterative reconstruction, and/or weight based adjustment of the mA/kV was utilized to reduce the radiation dose to as low as reasonably achievable. COMPARISON: CT of the head dated 08/11/2024. CLINICAL HISTORY: Head trauma, minor (Age >= 65y). FINDINGS: BRAIN AND VENTRICLES: No acute hemorrhage. No evidence of acute infarct. No hydrocephalus. No extra-axial collection. No mass effect or midline shift. There are chronic encephalomalacia changes again demonstrated within the right parietal, occipital and temporal lobes. There is mild periventricular white matter disease. There is calcification within the carotid siphons. ORBITS: No acute abnormality. SINUSES: No acute abnormality. SOFT TISSUES AND SKULL: No acute soft tissue abnormality. No skull fracture. IMPRESSION: 1. No acute intracranial abnormality. 2. Chronic encephalomalacia changes within the right parietal, occipital, and temporal lobes. 3. Mild periventricular white matter disease. 4. Calcification within the carotid siphons. Electronically signed by: Evalene Coho MD 11/26/2024 10:21 AM EST RP Workstation: [id number]   DG Chest 1 View Result Date:  11/26/2024 CLINICAL DATA:  Fall. EXAM: CHEST  1 VIEW COMPARISON:  07/15/2024 FINDINGS: Both lungs are clear. Heart and mediastinum are within normal limits. Loop recorder overlying the left chest. Negative for a pneumothorax. No acute bone  abnormality. IMPRESSION: No acute cardiopulmonary disease. Electronically Signed   By: Juliene Balder M.D.   On: 11/26/2024 09:52   DG Pelvis Portable Result Date: 11/26/2024 EXAM: 1 or 2 VIEW(S) XRAY OF THE PELVIS 11/26/2024 09:00:35 AM COMPARISON: 07/15/2024 CLINICAL HISTORY: fall FINDINGS: BONES AND JOINTS: No acute fracture. No malalignment. Mild degenerative changes of visualized lower lumbar spine. SOFT TISSUES: The soft tissues are unremarkable. IMPRESSION: 1. No evidence of acute traumatic injury. Electronically signed by: Waddell Calk MD 11/26/2024 09:50 AM EST RP Workstation: [id number]   PROCEDURES: Critical Care performed: No Procedures MEDICATIONS ORDERED IN ED: Medications - No data to display IMPRESSION / MDM / ASSESSMENT AND PLAN / ED COURSE  I reviewed the triage vital signs and the nursing notes.                             The patient is on the cardiac monitor to evaluate for evidence of arrhythmia and/or significant heart rate changes. Patient's presentation is most consistent with acute presentation with potential threat to life or bodily function. Patient 73 year old male who presents for the second time in 24 hours following a mechanical fall.  Patient had a positive head strike however did not lose consciousness.  Patient's radiologic evaluation does not show any evidence of acute abnormalities.  Patient's daughter is at bedside and states he is at his baseline.  Patient's daughter does express concern over the fact that patient forgets that he needs to use a walker and walks with it held up above the ground.  Discussed the need for staff to constantly redirect patient whenever he is up walking to place his walker on the ground.  Instructions placed in discharge.  Patient's daughter agrees with plan for discharge at this time and outpatient follow-up as needed.  Daughter given strict return precautions and all questions answered prior to discharge  Dispo: Discharge home with  PCP follow-up   FINAL CLINICAL IMPRESSION(S) / ED DIAGNOSES   Final diagnoses:  Fall, initial encounter  Injury of head, initial encounter   Rx / DC Orders   ED Discharge Orders     None      Note:  This document was prepared using Dragon voice recognition software and may include unintentional dictation errors.   Jossie Artist POUR, MD 11/26/24 2129

## 2024-11-26 NOTE — ED Notes (Addendum)
 Pt was soiled upon arrival. Pt was changed. Pt in gown, has on a brief and chux. Pt's clothes, socks and shoes were placed in a belongings bag.

## 2024-11-26 NOTE — ED Triage Notes (Signed)
 Presents via EMS from [facility] s/p fall Unwitnessed fall   Found on the floor   Denies any pain  was ambulatory at scene

## 2024-11-26 NOTE — ED Notes (Signed)
 [name] to nurse desk stating that pt has to go to the BR and is getting agitated. This NT and Nurse Zach assisted pt to bedside commode. Pt is very confused.

## 2024-11-26 NOTE — ED Notes (Signed)
 [name] came to nurse desk stating that her dad had to use the BR.

## 2024-11-26 NOTE — ED Notes (Signed)
 Pt stating that he needs to use the BR. This NT went in pt room, urinal placed and bed pan. Pt had no urine output and no BM.

## 2024-11-26 NOTE — ED Provider Notes (Signed)
 [facility] Provider Note    Event Date/Time   First MD Initiated Contact with Patient 11/26/24 [phone]     (approximate)   History   Fall   HPI  [name] is a 73 y.o. male with a past medical history of hypertension, hyperlipidemia, CVA, dementia who presents today for evaluation after a mechanical fall.  Patient is from the [place] [facility] [address]  Patient reportedly had a fall this morning and landed on his hip.  The staff does not remember which side he landed on.  They do not think that he struck his head.  He has been at his mental baseline since the fall.  He uses a walker to ambulate, though was not using it this morning.  He has not had any vomiting.  He is not anticoagulated.  Patient denies any complaints or pain today.  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) 02/19/2024   Memory change 11/20/2020   Pain in joint of right knee 11/20/2020   Pain in joint of left knee 11/20/2020   Acute cerebrovascular accident (CVA) (HCC) 01/20/2020   Benign essential hypertension 01/20/2020   Stroke (HCC) 01/20/2020   Left homonymous hemianopsia 12/23/2019   Type II diabetes mellitus (HCC) 09/26/2014            Physical Exam   Triage Vital Signs: ED Triage Vitals  Encounter Vitals Group     BP 11/26/24 0825 (!) 150/66     Girls Systolic BP Percentile --      Girls Diastolic BP Percentile --      Boys Systolic BP Percentile --      Boys Diastolic BP Percentile --      Pulse Rate 11/26/24 0825 80     Resp 11/26/24 0825 18     Temp 11/26/24 0825 98.7 F (37.1 C)     Temp Source 11/26/24 0825 Oral     SpO2 11/26/24 0825 95 %     Weight 11/26/24 0821 214 lb 8.1 oz (97.3 kg)     Height 11/26/24 0821 5' 7 (1.702 m)     Head Circumference --      Peak Flow --      Pain Score --      Pain Loc --      Pain Education --       Exclude from Growth Chart --     Most recent vital signs: Vitals:   11/26/24 0825 11/26/24 1245  BP: (!) 150/66 (!) 167/78  Pulse: 80 84  Resp: 18 18  Temp: 98.7 F (37.1 C)   SpO2: 95% 99%    Physical Exam Vitals and nursing note reviewed.  Constitutional:      General: Awake and alert. No acute distress.    Appearance: Normal appearance. The patient is normal weight.  HENT:     Head: Normocephalic and atraumatic.     Mouth: Mucous membranes are moist.  Eyes:     General: PERRL. Normal EOMs        Right eye: No discharge.        Left eye: No discharge.     Conjunctiva/sclera: Conjunctivae normal.  Cardiovascular:     Rate and Rhythm: Normal rate.     Pulses: Normal pulses.  Pulmonary:     Effort: Pulmonary effort is normal. No respiratory distress.  Breath sounds: Normal breath sounds.  Abdominal:     Abdomen is soft. There is no abdominal tenderness. No rebound or guarding. No distention. Musculoskeletal:        General: No swelling. Normal range of motion.     Cervical back: Normal range of motion and neck supple.  Skin:    General: Skin is warm and dry.     Capillary Refill: Capillary refill takes less than 2 seconds.     Findings: No rash.  Neurological:     Mental Status: The patient is awake and alert.  At mental baseline according to [facility] staff as well as daughters at bedside     ED Results / Procedures / Treatments   Labs (all labs ordered are listed, but only abnormal results are displayed) Labs Reviewed  CBC WITH DIFFERENTIAL/PLATELET - Abnormal; Notable for the following components:      Result Value   RBC 4.18 (*)    Hemoglobin 12.1 (*)    HCT 36.5 (*)    All other components within normal limits  BASIC METABOLIC PANEL WITH GFR - Abnormal; Notable for the following components:   Sodium 147 (*)    Glucose, Bld 120 (*)    BUN 28 (*)    All other components within normal limits  URINALYSIS, ROUTINE W REFLEX MICROSCOPIC -  Abnormal; Notable for the following components:   Color, Urine STRAW (*)    APPearance HAZY (*)    All other components within normal limits     EKG     RADIOLOGY I independently reviewed and interpreted imaging and agree with radiologists findings.     PROCEDURES:  Critical Care performed:   Procedures   MEDICATIONS ORDERED IN ED: Medications  sodium chloride  0.9 % bolus 1,000 mL (0 mLs Intravenous Stopped 11/26/24 1239)     IMPRESSION / MDM / ASSESSMENT AND PLAN / ED COURSE  I reviewed the triage vital signs and the nursing notes.   Differential diagnosis includes, but is not limited to, fall, contusion, intracranial hemorrhage, fracture.  Patient is awake and alert, at his mental baseline per his family at the bedside.  I called the [facility] and spoke with staff, who reported that the staff member had turned to attend to another member who was banging on a window, and the patient had a fall right next to the staff member.  He was not on the ground for prolonged period of time.  He has been able to ambulate since the fall.  [facility] reports no other concerns today.  I reviewed the patient's chart.  Patient had a recent visit to [facility] emergency department at which time he was placed.  He was placed back at the facility with Ativan .  CT head and neck obtained per [place] criteria negative for any acute findings.  Labs obtained are near baseline.  He did appear to be mildly dehydrated and was hydrated with a liter of fluids.  Urinalysis is without evidence of infection.  X-rays are negative as well.  Patient's family at bedside feel comfortable with patient discharge back to facility.  Advised that he continue to use his walker.   Patient's presentation is most consistent with acute complicated illness / injury requiring diagnostic workup.   Clinical Course as of 11/26/24 1357  Sat Nov 26, 2024  0830 I spoke with nursing staff at the [place] [facility] [address]  They  report that they had turned to assist another member who was banging  on the window, and then the patient slipped while coming out of the bathroom and fell onto his hip.  They sent him here for imaging.  They report that he has been at his mental baseline and have no other concerns today. [JP]    Clinical Course User Index [JP] Deyonte Cadden E, PA-C     FINAL CLINICAL IMPRESSION(S) / ED DIAGNOSES   Final diagnoses:  Fall, initial encounter  Injury of head, initial encounter     Rx / DC Orders   ED Discharge Orders     None        Note:  This document was prepared using Dragon voice recognition software and may include unintentional dictation errors.   Chaise Mahabir E, PA-C 11/26/24 1358    Willo Dunnings, MD 11/26/24 1426

## 2024-11-26 NOTE — ED Notes (Signed)
 CT came for pt, pt stated to CT that he needed to use the BR. This NT placed urinal and no urine output.

## 2024-11-26 NOTE — ED Triage Notes (Signed)
 BIB EMS. Came from [facility], witness fall/hit head, prior admitted to ED today. No blood thinners, alert to baseline. Hx stroke/dementia.3rd fall today.

## 2024-11-26 NOTE — ED Notes (Signed)
 Attempted to call [facility] twice without success.

## 2024-11-26 NOTE — Discharge Instructions (Signed)
 Please follow-up with your outpatient provider.  Please remember to stay hydrated.  Please use your walker.  Please return for any new, worsening, or changing symptoms or other concerns.  It is a pleasure caring for you today.

## 2024-11-28 ENCOUNTER — Encounter: Payer: Self-pay | Admitting: Adult Health

## 2024-11-28 LAB — CUP PACEART REMOTE DEVICE CHECK
Date Time Interrogation Session: 20251205230311
Implantable Pulse Generator Implant Date: 20210201

## 2024-11-29 NOTE — Telephone Encounter (Signed)
 Can you request notes and med list from facility? I do not believe these were sent over. Thank you!

## 2024-12-01 NOTE — Progress Notes (Signed)
 Remote Loop Recorder Transmission

## 2024-12-06 ENCOUNTER — Encounter: Payer: Self-pay | Admitting: Adult Health

## 2024-12-09 ENCOUNTER — Ambulatory Visit: Payer: Self-pay | Admitting: Cardiology

## 2024-12-16 ENCOUNTER — Ambulatory Visit: Payer: Medicare HMO

## 2024-12-27 ENCOUNTER — Ambulatory Visit

## 2024-12-27 DIAGNOSIS — R55 Syncope and collapse: Secondary | ICD-10-CM | POA: Diagnosis not present

## 2024-12-27 LAB — CUP PACEART REMOTE DEVICE CHECK
Date Time Interrogation Session: 20260105231036
Implantable Pulse Generator Implant Date: 20210201

## 2024-12-29 NOTE — Progress Notes (Signed)
 Remote Loop Recorder Transmission

## 2024-12-31 ENCOUNTER — Ambulatory Visit: Payer: Self-pay | Admitting: Cardiology

## 2025-01-27 ENCOUNTER — Ambulatory Visit

## 2025-01-27 LAB — CUP PACEART REMOTE DEVICE CHECK
Date Time Interrogation Session: 20260205230154
Implantable Pulse Generator Implant Date: 20210201

## 2025-02-27 ENCOUNTER — Ambulatory Visit

## 2025-03-30 ENCOUNTER — Ambulatory Visit

## 2025-04-30 ENCOUNTER — Ambulatory Visit

## 2025-05-31 ENCOUNTER — Ambulatory Visit

## 2025-06-05 ENCOUNTER — Ambulatory Visit: Admitting: Adult Health

## 2025-07-01 ENCOUNTER — Ambulatory Visit

## 2025-08-01 ENCOUNTER — Ambulatory Visit

## 2025-09-01 ENCOUNTER — Ambulatory Visit

## 2025-10-02 ENCOUNTER — Ambulatory Visit

## 2025-11-02 ENCOUNTER — Ambulatory Visit

## 2025-12-03 ENCOUNTER — Ambulatory Visit

## 2026-01-03 ENCOUNTER — Ambulatory Visit
